# Patient Record
Sex: Male | Born: 1955 | Race: White | Hispanic: No | Marital: Married | Smoking: Current some day smoker
Health system: Southern US, Community
[De-identification: ages and names within clinical notes are randomized; demographics above are authoritative.]

## PROBLEM LIST (undated history)

## (undated) DIAGNOSIS — M779 Enthesopathy, unspecified: Secondary | ICD-10-CM

## (undated) DIAGNOSIS — J189 Pneumonia, unspecified organism: Secondary | ICD-10-CM

## (undated) DIAGNOSIS — K219 Gastro-esophageal reflux disease without esophagitis: Secondary | ICD-10-CM

## (undated) DIAGNOSIS — R0683 Snoring: Secondary | ICD-10-CM

## (undated) DIAGNOSIS — I509 Heart failure, unspecified: Secondary | ICD-10-CM

## (undated) DIAGNOSIS — C801 Malignant (primary) neoplasm, unspecified: Secondary | ICD-10-CM

## (undated) DIAGNOSIS — R0600 Dyspnea, unspecified: Secondary | ICD-10-CM

## (undated) DIAGNOSIS — J449 Chronic obstructive pulmonary disease, unspecified: Secondary | ICD-10-CM

## (undated) DIAGNOSIS — I2510 Atherosclerotic heart disease of native coronary artery without angina pectoris: Secondary | ICD-10-CM

## (undated) DIAGNOSIS — Z720 Tobacco use: Secondary | ICD-10-CM

## (undated) DIAGNOSIS — M1990 Unspecified osteoarthritis, unspecified site: Secondary | ICD-10-CM

## (undated) DIAGNOSIS — I429 Cardiomyopathy, unspecified: Secondary | ICD-10-CM

## (undated) DIAGNOSIS — K760 Fatty (change of) liver, not elsewhere classified: Secondary | ICD-10-CM

## (undated) DIAGNOSIS — C3490 Malignant neoplasm of unspecified part of unspecified bronchus or lung: Secondary | ICD-10-CM

## (undated) DIAGNOSIS — E119 Type 2 diabetes mellitus without complications: Secondary | ICD-10-CM

## (undated) DIAGNOSIS — G709 Myoneural disorder, unspecified: Secondary | ICD-10-CM

## (undated) DIAGNOSIS — I214 Non-ST elevation (NSTEMI) myocardial infarction: Secondary | ICD-10-CM

## (undated) HISTORY — DX: Morbid (severe) obesity due to excess calories: E66.01

## (undated) HISTORY — PX: SHOULDER ARTHROSCOPY: SHX128

## (undated) HISTORY — PX: CHOLECYSTECTOMY: SHX55

## (undated) HISTORY — PX: TONSILLECTOMY: SUR1361

## (undated) HISTORY — PX: WISDOM TOOTH EXTRACTION: SHX21

## (undated) HISTORY — PX: COLONOSCOPY: SHX174

## (undated) HISTORY — DX: Non-ST elevation (NSTEMI) myocardial infarction: I21.4

## (undated) HISTORY — DX: Malignant neoplasm of unspecified part of unspecified bronchus or lung: C34.90

## (undated) HISTORY — DX: Cardiomyopathy, unspecified: I42.9

## (undated) HISTORY — DX: Type 2 diabetes mellitus without complications: E11.9

## (undated) HISTORY — PX: UPPER GI ENDOSCOPY: SHX6162

---

## 2015-05-11 DIAGNOSIS — E1165 Type 2 diabetes mellitus with hyperglycemia: Secondary | ICD-10-CM | POA: Diagnosis not present

## 2015-05-11 DIAGNOSIS — K5900 Constipation, unspecified: Secondary | ICD-10-CM | POA: Diagnosis not present

## 2015-06-16 DIAGNOSIS — F17210 Nicotine dependence, cigarettes, uncomplicated: Secondary | ICD-10-CM | POA: Diagnosis not present

## 2015-06-16 DIAGNOSIS — R1030 Lower abdominal pain, unspecified: Secondary | ICD-10-CM | POA: Diagnosis not present

## 2015-06-16 DIAGNOSIS — Z1389 Encounter for screening for other disorder: Secondary | ICD-10-CM | POA: Diagnosis not present

## 2015-06-21 DIAGNOSIS — J020 Streptococcal pharyngitis: Secondary | ICD-10-CM | POA: Diagnosis not present

## 2015-06-21 DIAGNOSIS — R110 Nausea: Secondary | ICD-10-CM | POA: Diagnosis not present

## 2015-07-07 DIAGNOSIS — R21 Rash and other nonspecific skin eruption: Secondary | ICD-10-CM | POA: Diagnosis not present

## 2015-07-22 DIAGNOSIS — R21 Rash and other nonspecific skin eruption: Secondary | ICD-10-CM | POA: Diagnosis not present

## 2015-08-21 DIAGNOSIS — J440 Chronic obstructive pulmonary disease with acute lower respiratory infection: Secondary | ICD-10-CM | POA: Diagnosis not present

## 2015-08-21 DIAGNOSIS — I10 Essential (primary) hypertension: Secondary | ICD-10-CM | POA: Diagnosis not present

## 2015-08-21 DIAGNOSIS — E1165 Type 2 diabetes mellitus with hyperglycemia: Secondary | ICD-10-CM | POA: Diagnosis not present

## 2015-08-21 DIAGNOSIS — B358 Other dermatophytoses: Secondary | ICD-10-CM | POA: Diagnosis not present

## 2015-08-21 DIAGNOSIS — K210 Gastro-esophageal reflux disease with esophagitis: Secondary | ICD-10-CM | POA: Diagnosis not present

## 2015-11-26 DIAGNOSIS — I10 Essential (primary) hypertension: Secondary | ICD-10-CM | POA: Diagnosis not present

## 2015-11-26 DIAGNOSIS — J440 Chronic obstructive pulmonary disease with acute lower respiratory infection: Secondary | ICD-10-CM | POA: Diagnosis not present

## 2015-11-26 DIAGNOSIS — Z125 Encounter for screening for malignant neoplasm of prostate: Secondary | ICD-10-CM | POA: Diagnosis not present

## 2015-11-26 DIAGNOSIS — B358 Other dermatophytoses: Secondary | ICD-10-CM | POA: Diagnosis not present

## 2015-11-26 DIAGNOSIS — K210 Gastro-esophageal reflux disease with esophagitis: Secondary | ICD-10-CM | POA: Diagnosis not present

## 2015-11-26 DIAGNOSIS — E1165 Type 2 diabetes mellitus with hyperglycemia: Secondary | ICD-10-CM | POA: Diagnosis not present

## 2015-12-14 DIAGNOSIS — M25512 Pain in left shoulder: Secondary | ICD-10-CM | POA: Diagnosis not present

## 2016-03-04 DIAGNOSIS — F17200 Nicotine dependence, unspecified, uncomplicated: Secondary | ICD-10-CM | POA: Diagnosis not present

## 2016-03-04 DIAGNOSIS — K219 Gastro-esophageal reflux disease without esophagitis: Secondary | ICD-10-CM | POA: Diagnosis not present

## 2016-03-04 DIAGNOSIS — M19012 Primary osteoarthritis, left shoulder: Secondary | ICD-10-CM | POA: Diagnosis not present

## 2016-03-04 DIAGNOSIS — E1165 Type 2 diabetes mellitus with hyperglycemia: Secondary | ICD-10-CM | POA: Diagnosis not present

## 2016-03-04 DIAGNOSIS — I10 Essential (primary) hypertension: Secondary | ICD-10-CM | POA: Diagnosis not present

## 2016-03-04 DIAGNOSIS — M19011 Primary osteoarthritis, right shoulder: Secondary | ICD-10-CM | POA: Diagnosis not present

## 2016-03-04 DIAGNOSIS — M5030 Other cervical disc degeneration, unspecified cervical region: Secondary | ICD-10-CM | POA: Diagnosis not present

## 2016-03-04 DIAGNOSIS — A6920 Lyme disease, unspecified: Secondary | ICD-10-CM | POA: Diagnosis not present

## 2016-04-07 DIAGNOSIS — J40 Bronchitis, not specified as acute or chronic: Secondary | ICD-10-CM | POA: Diagnosis not present

## 2016-05-09 DIAGNOSIS — B349 Viral infection, unspecified: Secondary | ICD-10-CM | POA: Diagnosis not present

## 2016-05-13 DIAGNOSIS — R05 Cough: Secondary | ICD-10-CM | POA: Diagnosis not present

## 2016-05-13 DIAGNOSIS — R0602 Shortness of breath: Secondary | ICD-10-CM | POA: Diagnosis not present

## 2016-05-13 DIAGNOSIS — Z87891 Personal history of nicotine dependence: Secondary | ICD-10-CM | POA: Diagnosis not present

## 2016-06-13 DIAGNOSIS — J181 Lobar pneumonia, unspecified organism: Secondary | ICD-10-CM | POA: Diagnosis not present

## 2016-06-13 DIAGNOSIS — J189 Pneumonia, unspecified organism: Secondary | ICD-10-CM | POA: Diagnosis not present

## 2016-07-04 DIAGNOSIS — Z6827 Body mass index (BMI) 27.0-27.9, adult: Secondary | ICD-10-CM | POA: Diagnosis not present

## 2016-07-04 DIAGNOSIS — E7801 Familial hypercholesterolemia: Secondary | ICD-10-CM | POA: Diagnosis not present

## 2016-07-04 DIAGNOSIS — E1165 Type 2 diabetes mellitus with hyperglycemia: Secondary | ICD-10-CM | POA: Diagnosis not present

## 2016-07-04 DIAGNOSIS — Z0000 Encounter for general adult medical examination without abnormal findings: Secondary | ICD-10-CM | POA: Diagnosis not present

## 2016-07-04 DIAGNOSIS — Z1211 Encounter for screening for malignant neoplasm of colon: Secondary | ICD-10-CM | POA: Diagnosis not present

## 2016-07-04 DIAGNOSIS — M546 Pain in thoracic spine: Secondary | ICD-10-CM | POA: Diagnosis not present

## 2016-07-04 DIAGNOSIS — I10 Essential (primary) hypertension: Secondary | ICD-10-CM | POA: Diagnosis not present

## 2016-07-22 DIAGNOSIS — M549 Dorsalgia, unspecified: Secondary | ICD-10-CM | POA: Diagnosis not present

## 2016-10-10 DIAGNOSIS — Z6841 Body Mass Index (BMI) 40.0 and over, adult: Secondary | ICD-10-CM | POA: Diagnosis not present

## 2016-10-10 DIAGNOSIS — M549 Dorsalgia, unspecified: Secondary | ICD-10-CM | POA: Diagnosis not present

## 2016-10-10 DIAGNOSIS — F17210 Nicotine dependence, cigarettes, uncomplicated: Secondary | ICD-10-CM | POA: Diagnosis not present

## 2016-10-10 DIAGNOSIS — Z299 Encounter for prophylactic measures, unspecified: Secondary | ICD-10-CM | POA: Diagnosis not present

## 2016-10-10 DIAGNOSIS — E1165 Type 2 diabetes mellitus with hyperglycemia: Secondary | ICD-10-CM | POA: Diagnosis not present

## 2016-11-07 DIAGNOSIS — Z299 Encounter for prophylactic measures, unspecified: Secondary | ICD-10-CM | POA: Diagnosis not present

## 2016-11-07 DIAGNOSIS — Z6841 Body Mass Index (BMI) 40.0 and over, adult: Secondary | ICD-10-CM | POA: Diagnosis not present

## 2016-11-07 DIAGNOSIS — E1165 Type 2 diabetes mellitus with hyperglycemia: Secondary | ICD-10-CM | POA: Diagnosis not present

## 2016-11-07 DIAGNOSIS — F17210 Nicotine dependence, cigarettes, uncomplicated: Secondary | ICD-10-CM | POA: Diagnosis not present

## 2016-11-07 DIAGNOSIS — Z713 Dietary counseling and surveillance: Secondary | ICD-10-CM | POA: Diagnosis not present

## 2016-12-04 DIAGNOSIS — Z7982 Long term (current) use of aspirin: Secondary | ICD-10-CM | POA: Diagnosis not present

## 2016-12-04 DIAGNOSIS — M7062 Trochanteric bursitis, left hip: Secondary | ICD-10-CM | POA: Diagnosis not present

## 2016-12-04 DIAGNOSIS — F17200 Nicotine dependence, unspecified, uncomplicated: Secondary | ICD-10-CM | POA: Diagnosis not present

## 2016-12-04 DIAGNOSIS — Z7984 Long term (current) use of oral hypoglycemic drugs: Secondary | ICD-10-CM | POA: Diagnosis not present

## 2016-12-04 DIAGNOSIS — Z79899 Other long term (current) drug therapy: Secondary | ICD-10-CM | POA: Diagnosis not present

## 2016-12-04 DIAGNOSIS — E119 Type 2 diabetes mellitus without complications: Secondary | ICD-10-CM | POA: Diagnosis not present

## 2016-12-04 DIAGNOSIS — M7072 Other bursitis of hip, left hip: Secondary | ICD-10-CM | POA: Diagnosis not present

## 2016-12-04 DIAGNOSIS — M1612 Unilateral primary osteoarthritis, left hip: Secondary | ICD-10-CM | POA: Diagnosis not present

## 2017-01-17 DIAGNOSIS — F17210 Nicotine dependence, cigarettes, uncomplicated: Secondary | ICD-10-CM | POA: Diagnosis not present

## 2017-01-17 DIAGNOSIS — E1165 Type 2 diabetes mellitus with hyperglycemia: Secondary | ICD-10-CM | POA: Diagnosis not present

## 2017-01-17 DIAGNOSIS — Z299 Encounter for prophylactic measures, unspecified: Secondary | ICD-10-CM | POA: Diagnosis not present

## 2017-01-17 DIAGNOSIS — J069 Acute upper respiratory infection, unspecified: Secondary | ICD-10-CM | POA: Diagnosis not present

## 2017-01-17 DIAGNOSIS — Z2821 Immunization not carried out because of patient refusal: Secondary | ICD-10-CM | POA: Diagnosis not present

## 2017-01-17 DIAGNOSIS — Z6841 Body Mass Index (BMI) 40.0 and over, adult: Secondary | ICD-10-CM | POA: Diagnosis not present

## 2017-02-14 DIAGNOSIS — F17210 Nicotine dependence, cigarettes, uncomplicated: Secondary | ICD-10-CM | POA: Diagnosis not present

## 2017-02-14 DIAGNOSIS — J069 Acute upper respiratory infection, unspecified: Secondary | ICD-10-CM | POA: Diagnosis not present

## 2017-02-14 DIAGNOSIS — E1165 Type 2 diabetes mellitus with hyperglycemia: Secondary | ICD-10-CM | POA: Diagnosis not present

## 2017-02-14 DIAGNOSIS — Z299 Encounter for prophylactic measures, unspecified: Secondary | ICD-10-CM | POA: Diagnosis not present

## 2017-02-14 DIAGNOSIS — Z6841 Body Mass Index (BMI) 40.0 and over, adult: Secondary | ICD-10-CM | POA: Diagnosis not present

## 2017-02-14 DIAGNOSIS — H6690 Otitis media, unspecified, unspecified ear: Secondary | ICD-10-CM | POA: Diagnosis not present

## 2017-03-10 ENCOUNTER — Other Ambulatory Visit (HOSPITAL_COMMUNITY): Payer: Self-pay | Admitting: Nurse Practitioner

## 2017-03-10 ENCOUNTER — Ambulatory Visit (HOSPITAL_COMMUNITY)
Admission: RE | Admit: 2017-03-10 | Discharge: 2017-03-10 | Disposition: A | Discharge status: Home / Self Care | Payer: Medicare Other | Source: Ambulatory Visit | Attending: Nurse Practitioner | Admitting: Nurse Practitioner

## 2017-03-10 DIAGNOSIS — M79605 Pain in left leg: Secondary | ICD-10-CM | POA: Diagnosis not present

## 2017-03-10 DIAGNOSIS — F17210 Nicotine dependence, cigarettes, uncomplicated: Secondary | ICD-10-CM | POA: Diagnosis not present

## 2017-03-10 DIAGNOSIS — Z299 Encounter for prophylactic measures, unspecified: Secondary | ICD-10-CM | POA: Diagnosis not present

## 2017-03-10 DIAGNOSIS — Z6841 Body Mass Index (BMI) 40.0 and over, adult: Secondary | ICD-10-CM | POA: Diagnosis not present

## 2017-03-10 DIAGNOSIS — M79662 Pain in left lower leg: Secondary | ICD-10-CM | POA: Diagnosis not present

## 2017-03-10 DIAGNOSIS — M7989 Other specified soft tissue disorders: Secondary | ICD-10-CM | POA: Diagnosis not present

## 2017-03-10 DIAGNOSIS — E1165 Type 2 diabetes mellitus with hyperglycemia: Secondary | ICD-10-CM | POA: Diagnosis not present

## 2017-03-13 ENCOUNTER — Ambulatory Visit (HOSPITAL_COMMUNITY): Payer: Self-pay

## 2017-11-06 DIAGNOSIS — E1165 Type 2 diabetes mellitus with hyperglycemia: Secondary | ICD-10-CM | POA: Diagnosis not present

## 2017-11-06 DIAGNOSIS — Z6841 Body Mass Index (BMI) 40.0 and over, adult: Secondary | ICD-10-CM | POA: Diagnosis not present

## 2017-11-06 DIAGNOSIS — L739 Follicular disorder, unspecified: Secondary | ICD-10-CM | POA: Diagnosis not present

## 2017-11-06 DIAGNOSIS — K219 Gastro-esophageal reflux disease without esophagitis: Secondary | ICD-10-CM | POA: Diagnosis not present

## 2017-11-06 DIAGNOSIS — Z299 Encounter for prophylactic measures, unspecified: Secondary | ICD-10-CM | POA: Diagnosis not present

## 2017-11-23 DIAGNOSIS — Z6841 Body Mass Index (BMI) 40.0 and over, adult: Secondary | ICD-10-CM | POA: Diagnosis not present

## 2017-11-23 DIAGNOSIS — R079 Chest pain, unspecified: Secondary | ICD-10-CM | POA: Diagnosis not present

## 2017-11-23 DIAGNOSIS — M1710 Unilateral primary osteoarthritis, unspecified knee: Secondary | ICD-10-CM | POA: Diagnosis not present

## 2017-11-23 DIAGNOSIS — R5383 Other fatigue: Secondary | ICD-10-CM | POA: Diagnosis not present

## 2017-11-23 DIAGNOSIS — I7389 Other specified peripheral vascular diseases: Secondary | ICD-10-CM | POA: Diagnosis not present

## 2017-11-23 DIAGNOSIS — Z299 Encounter for prophylactic measures, unspecified: Secondary | ICD-10-CM | POA: Diagnosis not present

## 2018-02-13 DIAGNOSIS — Z0000 Encounter for general adult medical examination without abnormal findings: Secondary | ICD-10-CM | POA: Diagnosis not present

## 2018-02-13 DIAGNOSIS — Z7189 Other specified counseling: Secondary | ICD-10-CM | POA: Diagnosis not present

## 2018-02-13 DIAGNOSIS — R5383 Other fatigue: Secondary | ICD-10-CM | POA: Diagnosis not present

## 2018-02-13 DIAGNOSIS — Z1211 Encounter for screening for malignant neoplasm of colon: Secondary | ICD-10-CM | POA: Diagnosis not present

## 2018-02-13 DIAGNOSIS — E1165 Type 2 diabetes mellitus with hyperglycemia: Secondary | ICD-10-CM | POA: Diagnosis not present

## 2018-02-13 DIAGNOSIS — E7800 Pure hypercholesterolemia, unspecified: Secondary | ICD-10-CM | POA: Diagnosis not present

## 2018-02-13 DIAGNOSIS — I7389 Other specified peripheral vascular diseases: Secondary | ICD-10-CM | POA: Diagnosis not present

## 2018-02-13 DIAGNOSIS — Z1331 Encounter for screening for depression: Secondary | ICD-10-CM | POA: Diagnosis not present

## 2018-02-13 DIAGNOSIS — Z1339 Encounter for screening examination for other mental health and behavioral disorders: Secondary | ICD-10-CM | POA: Diagnosis not present

## 2018-02-14 DIAGNOSIS — Z79899 Other long term (current) drug therapy: Secondary | ICD-10-CM | POA: Diagnosis not present

## 2018-02-14 DIAGNOSIS — R5383 Other fatigue: Secondary | ICD-10-CM | POA: Diagnosis not present

## 2018-02-14 DIAGNOSIS — Z125 Encounter for screening for malignant neoplasm of prostate: Secondary | ICD-10-CM | POA: Diagnosis not present

## 2018-02-14 DIAGNOSIS — E7800 Pure hypercholesterolemia, unspecified: Secondary | ICD-10-CM | POA: Diagnosis not present

## 2018-02-20 DIAGNOSIS — R0689 Other abnormalities of breathing: Secondary | ICD-10-CM | POA: Diagnosis not present

## 2018-02-20 DIAGNOSIS — G4769 Other sleep related movement disorders: Secondary | ICD-10-CM | POA: Diagnosis not present

## 2018-02-20 DIAGNOSIS — G4733 Obstructive sleep apnea (adult) (pediatric): Secondary | ICD-10-CM | POA: Diagnosis not present

## 2018-02-20 DIAGNOSIS — R0902 Hypoxemia: Secondary | ICD-10-CM | POA: Diagnosis not present

## 2018-02-20 DIAGNOSIS — I7389 Other specified peripheral vascular diseases: Secondary | ICD-10-CM | POA: Diagnosis not present

## 2018-03-02 DIAGNOSIS — E1165 Type 2 diabetes mellitus with hyperglycemia: Secondary | ICD-10-CM | POA: Diagnosis not present

## 2018-03-02 DIAGNOSIS — Z6841 Body Mass Index (BMI) 40.0 and over, adult: Secondary | ICD-10-CM | POA: Diagnosis not present

## 2018-03-02 DIAGNOSIS — J020 Streptococcal pharyngitis: Secondary | ICD-10-CM | POA: Diagnosis not present

## 2018-03-02 DIAGNOSIS — Z299 Encounter for prophylactic measures, unspecified: Secondary | ICD-10-CM | POA: Diagnosis not present

## 2018-03-29 ENCOUNTER — Other Ambulatory Visit: Payer: Self-pay

## 2018-03-29 ENCOUNTER — Encounter (HOSPITAL_COMMUNITY): Payer: Self-pay | Admitting: Emergency Medicine

## 2018-03-29 ENCOUNTER — Inpatient Hospital Stay (HOSPITAL_COMMUNITY)
Admission: EM | Admit: 2018-03-29 | Discharge: 2018-03-31 | DRG: 280 | Disposition: A | Discharge status: Home / Self Care | Payer: Medicare HMO | Attending: Cardiovascular Disease | Admitting: Cardiovascular Disease

## 2018-03-29 DIAGNOSIS — Z881 Allergy status to other antibiotic agents status: Secondary | ICD-10-CM | POA: Diagnosis not present

## 2018-03-29 DIAGNOSIS — I255 Ischemic cardiomyopathy: Secondary | ICD-10-CM | POA: Diagnosis present

## 2018-03-29 DIAGNOSIS — E785 Hyperlipidemia, unspecified: Secondary | ICD-10-CM | POA: Diagnosis present

## 2018-03-29 DIAGNOSIS — I25110 Atherosclerotic heart disease of native coronary artery with unstable angina pectoris: Secondary | ICD-10-CM | POA: Diagnosis present

## 2018-03-29 DIAGNOSIS — Z6841 Body Mass Index (BMI) 40.0 and over, adult: Secondary | ICD-10-CM | POA: Diagnosis not present

## 2018-03-29 DIAGNOSIS — E119 Type 2 diabetes mellitus without complications: Secondary | ICD-10-CM

## 2018-03-29 DIAGNOSIS — Z79899 Other long term (current) drug therapy: Secondary | ICD-10-CM | POA: Diagnosis not present

## 2018-03-29 DIAGNOSIS — Z7984 Long term (current) use of oral hypoglycemic drugs: Secondary | ICD-10-CM | POA: Diagnosis not present

## 2018-03-29 DIAGNOSIS — F17200 Nicotine dependence, unspecified, uncomplicated: Secondary | ICD-10-CM | POA: Diagnosis not present

## 2018-03-29 DIAGNOSIS — I5043 Acute on chronic combined systolic (congestive) and diastolic (congestive) heart failure: Secondary | ICD-10-CM | POA: Diagnosis present

## 2018-03-29 DIAGNOSIS — Z720 Tobacco use: Secondary | ICD-10-CM | POA: Diagnosis not present

## 2018-03-29 DIAGNOSIS — I214 Non-ST elevation (NSTEMI) myocardial infarction: Principal | ICD-10-CM | POA: Diagnosis present

## 2018-03-29 DIAGNOSIS — F17210 Nicotine dependence, cigarettes, uncomplicated: Secondary | ICD-10-CM | POA: Diagnosis not present

## 2018-03-29 DIAGNOSIS — I110 Hypertensive heart disease with heart failure: Secondary | ICD-10-CM | POA: Diagnosis present

## 2018-03-29 DIAGNOSIS — Z885 Allergy status to narcotic agent status: Secondary | ICD-10-CM

## 2018-03-29 DIAGNOSIS — Z7982 Long term (current) use of aspirin: Secondary | ICD-10-CM | POA: Diagnosis not present

## 2018-03-29 DIAGNOSIS — R079 Chest pain, unspecified: Secondary | ICD-10-CM | POA: Diagnosis not present

## 2018-03-29 DIAGNOSIS — Z5329 Procedure and treatment not carried out because of patient's decision for other reasons: Secondary | ICD-10-CM | POA: Diagnosis not present

## 2018-03-29 DIAGNOSIS — I2510 Atherosclerotic heart disease of native coronary artery without angina pectoris: Secondary | ICD-10-CM | POA: Diagnosis not present

## 2018-03-29 HISTORY — DX: Snoring: R06.83

## 2018-03-29 HISTORY — DX: Tobacco use: Z72.0

## 2018-03-29 LAB — I-STAT TROPONIN, ED: Troponin i, poc: 0.66 ng/mL (ref 0.00–0.08)

## 2018-03-29 LAB — BASIC METABOLIC PANEL
ANION GAP: 13 (ref 5–15)
BUN: 16 mg/dL (ref 8–23)
CO2: 30 mmol/L (ref 22–32)
Calcium: 9.3 mg/dL (ref 8.9–10.3)
Chloride: 101 mmol/L (ref 98–111)
Creatinine, Ser: 0.8 mg/dL (ref 0.61–1.24)
GFR calc Af Amer: >60 mL/min (ref >60)
GFR calc non Af Amer: >60 mL/min (ref >60)
GLUCOSE: 116 mg/dL — AB (ref 70–99)
POTASSIUM: 4.5 mmol/L (ref 3.5–5.1)
Sodium: 144 mmol/L (ref 135–145)

## 2018-03-29 LAB — HEPARIN LEVEL (UNFRACTIONATED): Heparin Unfractionated: <0.1 IU/mL — ABNORMAL LOW (ref 0.30–0.70)

## 2018-03-29 LAB — CBC
HCT: 62.2 % — ABNORMAL HIGH (ref 39.0–52.0)
Hemoglobin: 19.4 g/dL — ABNORMAL HIGH (ref 13.0–17.0)
MCH: 28.2 pg (ref 26.0–34.0)
MCHC: 31.2 g/dL (ref 30.0–36.0)
MCV: 90.3 fL (ref 80.0–100.0)
NRBC: 0 % (ref 0.0–0.2)
Platelets: 188 10*3/uL (ref 150–400)
RBC: 6.89 MIL/uL — ABNORMAL HIGH (ref 4.22–5.81)
RDW: 15.3 % (ref 11.5–15.5)
WBC: 8.2 10*3/uL (ref 4.0–10.5)

## 2018-03-29 LAB — TROPONIN I
Troponin I: 0.81 ng/mL (ref <0.03)
Troponin I: 0.43 ng/mL (ref <0.03)

## 2018-03-29 LAB — GLUCOSE, CAPILLARY
Glucose-Capillary: 133 mg/dL — ABNORMAL HIGH (ref 70–99)
Glucose-Capillary: 187 mg/dL — ABNORMAL HIGH (ref 70–99)

## 2018-03-29 LAB — TSH: TSH: 1.08 u[IU]/mL (ref 0.350–4.500)

## 2018-03-29 MED ORDER — HEPARIN BOLUS VIA INFUSION
4000.0000 [IU] | Freq: Once | INTRAVENOUS | Status: AC
  Administered 2018-03-29: 4000 [IU] via INTRAVENOUS
  Filled 2018-03-29: qty 4000

## 2018-03-29 MED ORDER — ACETAMINOPHEN 325 MG PO TABS: 650.0000 mg | ORAL_TABLET | ORAL | Status: DC | PRN

## 2018-03-29 MED ORDER — PANTOPRAZOLE SODIUM 40 MG PO TBEC
40.0000 mg | DELAYED_RELEASE_TABLET | Freq: Every day | ORAL | Status: DC
  Administered 2018-03-30 – 2018-03-31 (×2): 40 mg via ORAL
  Filled 2018-03-29 (×3): qty 1

## 2018-03-29 MED ORDER — ASPIRIN 81 MG PO CHEW
81.0000 mg | CHEWABLE_TABLET | ORAL | Status: AC
  Administered 2018-03-30: 81 mg via ORAL
  Filled 2018-03-29: qty 1

## 2018-03-29 MED ORDER — HEPARIN (PORCINE) 25000 UT/250ML-% IV SOLN
2050.0000 [IU]/h | INTRAVENOUS | Status: DC
  Administered 2018-03-29: 1500 [IU]/h via INTRAVENOUS
  Administered 2018-03-29: 1900 [IU]/h via INTRAVENOUS
  Filled 2018-03-29 (×2): qty 250

## 2018-03-29 MED ORDER — SODIUM CHLORIDE 0.9% FLUSH: 3.0000 mL | Freq: Two times a day (BID) | INTRAVENOUS | Status: DC

## 2018-03-29 MED ORDER — NITROGLYCERIN 0.4 MG SL SUBL: 0.4000 mg | SUBLINGUAL_TABLET | SUBLINGUAL | Status: DC | PRN

## 2018-03-29 MED ORDER — ASPIRIN EC 81 MG PO TBEC: 81.0000 mg | DELAYED_RELEASE_TABLET | Freq: Every day | ORAL | Status: DC

## 2018-03-29 MED ORDER — ATORVASTATIN CALCIUM 40 MG PO TABS
40.0000 mg | ORAL_TABLET | Freq: Every day | ORAL | Status: DC
  Filled 2018-03-29: qty 1

## 2018-03-29 MED ORDER — METOPROLOL TARTRATE 25 MG PO TABS
25.0000 mg | ORAL_TABLET | Freq: Two times a day (BID) | ORAL | Status: DC
  Administered 2018-03-30 – 2018-03-31 (×2): 25 mg via ORAL
  Filled 2018-03-29 (×3): qty 1

## 2018-03-29 MED ORDER — SODIUM CHLORIDE 0.9 % WEIGHT BASED INFUSION
3.0000 mL/kg/h | INTRAVENOUS | Status: AC
  Administered 2018-03-30: 3 mL/kg/h via INTRAVENOUS

## 2018-03-29 MED ORDER — NITROGLYCERIN 2 % TD OINT
1.0000 [in_us] | TOPICAL_OINTMENT | Freq: Four times a day (QID) | TRANSDERMAL | Status: DC
  Filled 2018-03-29: qty 30

## 2018-03-29 MED ORDER — ONDANSETRON HCL 4 MG/2ML IJ SOLN: 4.0000 mg | Freq: Four times a day (QID) | INTRAMUSCULAR | Status: DC | PRN

## 2018-03-29 MED ORDER — SODIUM CHLORIDE 0.9% FLUSH: 3.0000 mL | INTRAVENOUS | Status: DC | PRN

## 2018-03-29 MED ORDER — SODIUM CHLORIDE 0.9 % WEIGHT BASED INFUSION
1.0000 mL/kg/h | INTRAVENOUS | Status: DC
  Administered 2018-03-30: 1 mL/kg/h via INTRAVENOUS

## 2018-03-29 MED ORDER — SODIUM CHLORIDE 0.9 % IV SOLN: 250.0000 mL | INTRAVENOUS | Status: DC | PRN

## 2018-03-29 NOTE — Progress Notes (Signed)
ANTICOAGULATION CONSULT NOTE - Initial Consult  Pharmacy Consult for Heparin Indication: chest pain/ACS  Allergies  Allergen Reactions  . Codeine Nausea And Vomiting  . Doxycycline     Patient Measurements: Height: 5\' 9"  (175.3 cm) Weight: 289 lb (131.1 kg) IBW/kg (Calculated) : 70.7 Heparin Dosing Weight: 101 kg  Vital Signs: Temp: 98.4 F (36.9 C) (12/12 1305) Temp Source: Oral (12/12 1305) BP: 167/85 (12/12 1315) Pulse Rate: 100 (12/12 1315)  Labs: Recent Labs    03/29/18 1304  HGB 19.4*  HCT 62.2*  PLT 188    CrCl cannot be calculated (No successful lab value found.).   Medical History: Past Medical History:  Diagnosis Date  . Diabetes mellitus without complication (HCC)     Medications:  Await home med rec  Assessment: 62 y.o. M presented to [facility] ED - found to have elevated troponin and drove himself over to [facility] ED for possible cath - he refused transportation via ambulance. He was given ASA 324mg  and Nitroglycerin at [facility]. Pt reports that he wasn't given any Lovenox or Heparin. Pharmacy consulted to start heparin for NSTEMI. CBC wnl on admission  Goal of Therapy:  Heparin level 0.3-0.7 units/ml Monitor platelets by anticoagulation protocol: Yes   Plan:  Heparin IV bolus 4000 units Heparin gtt at 1500 units/hr Will f/u heparin level in 6 hours Daily heparin level and CBC  Sherlon Handing, PharmD, BCPS Clinical pharmacist  **Pharmacist phone directory can now be found on [email or web address] ([email or web address]).  Listed under [place]. 03/29/2018,1:42 PM

## 2018-03-29 NOTE — H&P (Addendum)
Cardiology Admission History and Physical:   Patient ID: Thomas [name] MRN: [id number]; DOB: Feb 26, 1956   Admission date: 03/29/2018  Primary Care Provider: No primary care provider on file. Primary Cardiologist: New to [facility] Chief Complaint:  Chest pain   Patient Profile:   Thomas [name] is a 62 y.o. male with history of diabetes mellitus, 50-pack-year tobacco smoking, snoring and morbid obesity presented for chest pain.  Remote stress test in 2011 at [place] was low risk.Marland Kitchen  He smokes 1 pack a day since age 62.  History of Present Illness:   Thomas [name] has been having exertional left-sided chest pressure for past 10 days.  No associated shortness of breath, diaphoresis, nausea or radiation.  Relieved with rest in 10 to 15 minutes.  Gradual worsening.  He went to [facility] where noted elevated troponin and requested transfer to [facility] however he declined and wife brought him over here.  Currently chest pain-free.  Denies resting pain.  Recently had sleep study which was inconclusive per patient.  Here he has elevated blood pressure, point-of-care troponin 0.66.  Hemoglobin 19.4.  Electrolyte and serum creatinine normal.  EKG shows sinus rhythm at rate of 100 bpm and anterior Q waves-personally reviewed.  He denies orthopnea, PND, syncope, dizziness, lower extremity edema or melena.  Past Medical History:  Diagnosis Date  . Diabetes mellitus without complication ([place])   . Snoring   . Tobacco abuse     History reviewed. No pertinent surgical history.   Medications Prior to Admission: Prior to Admission medications   Not on File  Takes diabetic medication  Allergies:    Allergies  Allergen Reactions  . Codeine Nausea And Vomiting  . Doxycycline     Social History:   Social History   Socioeconomic History  . Marital status: Not on file    Spouse name: Not on file  . Number of children: Not on file  . Years of education: Not on file  . Highest education  level: Not on file  Occupational History  . Not on file  Social Needs  . Financial resource strain: Not on file  . Food insecurity:    Worry: Not on file    Inability: Not on file  . Transportation needs:    Medical: Not on file    Non-medical: Not on file  Tobacco Use  . Smoking status: Not on file  Substance and Sexual Activity  . Alcohol use: Not on file  . Drug use: Not on file  . Sexual activity: Not on file  Lifestyle  . Physical activity:    Days per week: Not on file    Minutes per session: Not on file  . Stress: Not on file  Relationships  . Social connections:    Talks on phone: Not on file    Gets together: Not on file    Attends religious service: Not on file    Active member of club or organization: Not on file    Attends meetings of clubs or organizations: Not on file    Relationship status: Not on file  . Intimate partner violence:    Fear of current or ex partner: Not on file    Emotionally abused: Not on file    Physically abused: Not on file    Forced sexual activity: Not on file  Other Topics Concern  . Not on file  Social History Narrative  . Not on file    Family History:  The patient's  family history includes Emphysema in his father; Heart failure in his father.    ROS:  Please see the history of present illness.  All other ROS reviewed and negative.     Physical Exam/Data:   Vitals:   03/29/18 1345 03/29/18 1400 03/29/18 1415 03/29/18 1430  BP: (!) 155/84 (!) 162/82 (!) 146/80 (!) 149/80  Pulse: (!) 101 100 96 91  Resp: 16 (!) 21 (!) 22 (!) 21  Temp:      TempSrc:      SpO2: (!) 88% (!) 89% 90% (!) 89%  Weight:      Height:       No intake or output data in the 24 hours ending 03/29/18 1456 Filed Weights   03/29/18 1259  Weight: 131.1 kg   Body mass index is 42.68 kg/m.  General: Obese male in no acute distress HEENT: normal Lymph: no adenopathy Neck: no JVD Endocrine:  No thryomegaly Vascular: No carotid bruits; FA pulses  2+ bilaterally without bruits  Cardiac:  normal S1, S2; RRR; no murmur Lungs:  clear to auscultation bilaterally, no wheezing, rhonchi or rales  Abd: soft, nontender, no hepatomegaly  Ext: Trace edema Musculoskeletal:  No deformities, BUE and BLE strength normal and equal Skin: warm and dry  Neuro:  CNs 2-12 intact, no focal abnormalities noted Psych:  Normal affect   Relevant CV Studies: As summarized above  Laboratory Data:  Chemistry Recent Labs  Lab 03/29/18 1304  NA 144  K 4.5  CL 101  CO2 30  GLUCOSE 116*  BUN 16  CREATININE 0.80  CALCIUM 9.3  GFRNONAA >60  GFRAA >60  ANIONGAP 13    No results for input(s): PROT, ALBUMIN, AST, ALT, ALKPHOS, BILITOT in the last 168 hours. Hematology Recent Labs  Lab 03/29/18 1304  WBC 8.2  RBC 6.89*  HGB 19.4*  HCT 62.2*  MCV 90.3  MCH 28.2  MCHC 31.2  RDW 15.3  PLT 188   Cardiac EnzymesNo results for input(s): TROPONINI in the last 168 hours.  Recent Labs  Lab 03/29/18 1308  TROPIPOC 0.66*    Radiology/Studies:  No results found.  Assessment and Plan:   1. Non-STEMI -Point-of-care troponin 0.66.  EKG without acute abnormality.  Admit and cycle troponin.  Start aspirin, statin and beta-blocker.  Lipid panel and A1c. Get echo. -The patient understands that risks include but are not limited to stroke (1 in 1000), death (1 in 80), kidney failure [usually temporary] (1 in 500), bleeding (1 in 200), allergic reaction [possibly serious] (1 in 200), and agrees to proceed.   2.  Diabetes -Patient does not want insulin during admission.  Hold metformin for cath tomorrow. - Resume other diabetic medication per pharmacy.  3.  Tobacco abuse -Cessation advised.  Nicotine patch while here.  Patient likes to avoid frequent labs and vitals checks overnight as he gests headache for few days if not have good night sleep.  Severity of Illness: The appropriate patient status for this patient is INPATIENT. Inpatient status is  judged to be reasonable and necessary in order to provide the required intensity of service to ensure the patient's safety. The patient's presenting symptoms, physical exam findings, and initial radiographic and laboratory data in the context of their chronic comorbidities is felt to place them at high risk for further clinical deterioration. Furthermore, it is not anticipated that the patient will be medically stable for discharge from the hospital within 2 midnights of admission. The following factors support the patient status  of inpatient.   " The patient's presenting symptoms include chest pain. " The worrisome physical exam findings include obesity " The initial radiographic and laboratory data are worrisome because of elevated troponin " The chronic co-morbidities include Smoking and sedentary life   * I certify that at the point of admission it is my clinical judgment that the patient will require inpatient hospital care spanning beyond 2 midnights from the point of admission due to high intensity of service, high risk for further deterioration and high frequency of surveillance required.*    For questions or updates, please contact [place] Please consult [email or web address] for contact info under      Jarrett Soho, PA  03/29/2018 2:56 PM    I have personally seen and examined this patient with Thomas [name]. I agree with the assessment and plan as outlined above. Thomas [name] is a 62 yo male with history of morbid obesity, ongoing tobacco abuse and DM being admitted with chest pain. He has had exertional and resting chest pain for 10 days. The pain is relieved with rest. Troponin 0.66. EKG personally reviewed by me and shows sinus tachycardia, rate 100 bpm, poor R wave progression in the precordial leads-possible prior septal infarct. No pain at time of my exam.  My exam: General: Morbidly obese male in NAD.  HEENT: OP clear, mucus membranes moist  SKIN: warm, dry. No  rashes. Neuro: No focal deficits  Musculoskeletal: Muscle strength 5/5 all ext  Psychiatric: Mood and affect normal  Neck: No JVD, no carotid bruits, no thyromegaly, no lymphadenopathy.  Lungs:Clear bilaterally, no wheezes, rhonci, crackles Cardiovascular: Regular rate and rhythm. No murmurs, gallops or rubs. Abdomen:Soft. Bowel sounds present. Non-tender.  Extremities: No lower extremity edema. Pulses are 2 + in the bilateral DP/PT.  Plan: NSTEMI: Exertional chest pain c/w unstable angina. Elevated troponin. High probability of obstructive CAD. No active chest pain at rest. EKG without injury current. Will admit to telemetry unit. IV heparin tonight. Cycle troponin. Start ASA, statin and beta blocker. Cardiac cath tomorrow with possible PCI. NPO at midnight.   I have reviewed the risks, indications, and alternatives to cardiac catheterization, possible angioplasty, and stenting with the patient. Risks include but are not limited to bleeding, infection, vascular injury, stroke, myocardial infection, arrhythmia, kidney injury, radiation-related injury in the case of prolonged fluoroscopy use, emergency cardiac surgery, and death. The patient understands the risks of serious complication is 1-2 in 1740 with diagnostic cardiac cath and 1-2% or less with angioplasty/stenting.  Lauree Chandler 03/29/2018 3:39 PM

## 2018-03-29 NOTE — Progress Notes (Signed)
    Returned call to RN regarding page about elevated troponin. 0.66>>0.81. Pt is currently CP free and on IV heparin with plans for cath tomorrow.  Lyda Jester, PA-C 03/29/2018

## 2018-03-29 NOTE — ED Triage Notes (Signed)
Pt arrives POV to ED from [facility] ED with complaints of an elevated troponin of 0.06 and chest pain for the past two weeks. Pt reports that he came here to see a cardiologist and get cathed. Pt reports he took x3 nitro and is currently pain free. Pt placed in position of comfort with bed locked and lowered, call bell in reach.

## 2018-03-29 NOTE — Progress Notes (Signed)
Critical Result  - Troponin = 0.81. Dr Margaretann Loveless notified via [facility] text messaging.

## 2018-03-29 NOTE — ED Notes (Signed)
Cardiology at bedside.

## 2018-03-29 NOTE — ED Provider Notes (Signed)
[place] EMERGENCY DEPARTMENT Provider Note   CSN: [id number] Arrival date & time: 03/29/18  1248     History   Chief Complaint Chief Complaint  Patient presents with  . Abnormal Lab  . Chest Pain    HPI Thomas [name] is a 62 y.o. male.  HPI Patient reports for the past week he has been getting pain in his left upper chest with exertion.  If he walks for a little ways he starts to get a sharp and uncomfortable pain that radiates up towards the shoulder.  It improves with rest.  Sometimes it has associated slight nausea.  He has not had any syncopal episodes.  No significant shortness of breath.  He reports he chronically has some cough due to his allergies there is been no change.  Patient is also a longtime chronic smoker.  Patient was seen at the emergency department in [place] and advised that he had an NSTEMI.  They wish to treat him in transfer by ambulance but patient refused ambulance transport.  He has come by private vehicle.  Patient denies he has any chest pain at this time.  He denies any symptoms currently he reports that the symptoms come on when he starts to walk a short distance.  He denies he is ever had a cardiac catheterization before.  He reports he was given 3 aspirin and 3 nitroglycerin at [facility]. Past Medical History:  Diagnosis Date  . Diabetes mellitus without complication ([place])   . Snoring   . Tobacco abuse     There are no active problems to display for this patient.   History reviewed. No pertinent surgical history.      Home Medications    Prior to Admission medications   Medication Sig Start Date End Date Taking? Authorizing Provider  aspirin EC 81 MG tablet Take 81 mg by mouth daily.   Yes [provider]  canagliflozin (INVOKANA) 300 MG TABS tablet Take 300 mg by mouth daily before breakfast.   Yes [provider]  metFORMIN (GLUCOPHAGE) 500 MG tablet Take 500-1,000 mg by mouth See admin  instructions. Take 500 mg in the morning and 1000 mg in the evening   Yes [provider]  nitroGLYCERIN (NITROSTAT) 0.4 MG SL tablet Place 0.4 mg under the tongue every 5 (five) minutes as needed for chest pain.   Yes [provider]  omeprazole (PRILOSEC) 40 MG capsule Take 40 mg by mouth daily.   Yes [provider]    Family History Family History  Problem Relation Age of Onset  . Emphysema Father   . Heart failure Father     Social History Social History   Tobacco Use  . Smoking status: Not on file  Substance Use Topics  . Alcohol use: Not on file  . Drug use: Not on file     Allergies   Codeine and Doxycycline   Review of Systems Review of Systems 10 Systems reviewed and are negative for acute change except as noted in the HPI.   Physical Exam Updated Vital Signs BP (!) 153/84   Pulse 94   Temp 98.4 F (36.9 C) (Oral)   Resp 19   Ht 5\' 9"  (1.753 m)   Wt 131.1 kg   SpO2 (!) 89%   BMI 42.68 kg/m   Physical Exam Constitutional:      General: He is not in acute distress.    Appearance: He is obese. He is not ill-appearing or  diaphoretic.  HENT:     Head: Normocephalic and atraumatic.  Cardiovascular:     Rate and Rhythm: Normal rate and regular rhythm.     Pulses: Normal pulses.  Pulmonary:     Effort: Pulmonary effort is normal.     Breath sounds: Normal breath sounds.  Abdominal:     General: There is no distension.     Palpations: Abdomen is soft.     Tenderness: There is no abdominal tenderness. There is no guarding.  Musculoskeletal: Normal range of motion.     Comments: Trace edema bilateral lower extremities.  Left slightly greater than right.  (Patient reports this is chronic).  Skin:    General: Skin is warm and dry.  Neurological:     General: No focal deficit present.     Mental Status: He is alert and oriented to person, place, and time.     Cranial Nerves: No cranial nerve deficit.     Coordination:  Coordination normal.  Psychiatric:        Mood and Affect: Mood normal.      ED Treatments / Results  Labs (all labs ordered are listed, but only abnormal results are displayed) Labs Reviewed  BASIC METABOLIC PANEL - Abnormal; Notable for the following components:      Result Value   Glucose, Bld 116 (*)    All other components within normal limits  CBC - Abnormal; Notable for the following components:   RBC 6.89 (*)    Hemoglobin 19.4 (*)    HCT 62.2 (*)    All other components within normal limits  I-STAT TROPONIN, ED - Abnormal; Notable for the following components:   Troponin i, poc 0.66 (*)    All other components within normal limits  HEPARIN LEVEL (UNFRACTIONATED)    EKG EKG Interpretation  Date/Time:  Thursday March 29 2018 13:05:03 EST Ventricular Rate:  100 PR Interval:    QRS Duration: 100 QT Interval:  366 QTC Calculation: 473 R Axis:   -132 Text Interpretation:  Sinus tachycardia Anterior infarct, old poor r wave progression no STEMI Confirmed by Charlesetta Shanks [phone]) on 03/29/2018 3:19:25 PM   Radiology No results found.  Procedures Procedures (including critical care time) CRITICAL CARE Performed by: Charlesetta Shanks   Total critical care time: 30 minutes  Critical care time was exclusive of separately billable procedures and treating other patients.  Critical care was necessary to treat or prevent imminent or life-threatening deterioration.  Critical care was time spent personally by me on the following activities: development of treatment plan with patient and/or surrogate as well as nursing, discussions with consultants, evaluation of patient's response to treatment, examination of patient, obtaining history from patient or surrogate, ordering and performing treatments and interventions, ordering and review of laboratory studies, ordering and review of radiographic studies, pulse oximetry and re-evaluation of patient's condition. Medications  Ordered in ED Medications  nitroGLYCERIN (NITROGLYN) 2 % ointment 1 inch (1 inch Topical Not Given 03/29/18 1511)  heparin ADULT infusion 100 units/mL (25000 units/224mL sodium chloride 0.45%) (1,500 Units/hr Intravenous New Bag/Given 03/29/18 1407)  heparin bolus via infusion 4,000 Units (4,000 Units Intravenous Bolus from Bag 03/29/18 1408)     Initial Impression / Assessment and Plan / ED Course  I have reviewed the triage vital signs and the nursing notes.  Pertinent labs & imaging results that were available during my care of the patient were reviewed by me and considered in my medical decision making (see chart for details).  Clinical Course as of Mar 29 1518  Thu Mar 29, 2018  1422 Have ordered consults twice for cardiology.  At this point will try to direct page through [facility].   [MP]  [place] cardiology has returned page and will have consult sent for the patient.   [MP]    Clinical Course User Index [MP] Charlesetta Shanks, MD   Patient presents with history consistent with unstable angina.  He has been experiencing chest pain with exertion.  He has mildly elevated troponin.  Patient may have had a infarct earlier in the week.  He started having chest pain about a week ago that is now exertional.  Patient does not have active chest pain at this time.  He has been started on heparin.  He had aspirin prior to arrival.  Nitroglycerin paste applied.  Cardiology has been consulted.  Patient has multiple risk factors and is a longtime smoker.  Final Clinical Impressions(s) / ED Diagnoses   Final diagnoses:  NSTEMI (non-ST elevated myocardial infarction) [facility])    ED Discharge Orders    None       Charlesetta Shanks, MD 03/29/18 1521

## 2018-03-29 NOTE — Plan of Care (Signed)
  Problem: Education: Goal: Knowledge of General Education information will improve Description Including pain rating scale, medication(s)/side effects and non-pharmacologic comfort measures Outcome: Progressing   Problem: Clinical Measurements: Goal: Respiratory complications will improve Outcome: Completed/Met

## 2018-03-30 ENCOUNTER — Encounter (HOSPITAL_COMMUNITY)
Admission: EM | Disposition: A | Discharge status: Home / Self Care | Payer: Self-pay | Source: Home / Self Care | Attending: Cardiovascular Disease

## 2018-03-30 ENCOUNTER — Other Ambulatory Visit (HOSPITAL_COMMUNITY): Payer: Medicare HMO

## 2018-03-30 DIAGNOSIS — E785 Hyperlipidemia, unspecified: Secondary | ICD-10-CM

## 2018-03-30 DIAGNOSIS — I2510 Atherosclerotic heart disease of native coronary artery without angina pectoris: Secondary | ICD-10-CM

## 2018-03-30 HISTORY — PX: LEFT HEART CATH AND CORONARY ANGIOGRAPHY: CATH118249

## 2018-03-30 LAB — CBC
HEMATOCRIT: 59.3 % — AB (ref 39.0–52.0)
Hemoglobin: 18.1 g/dL — ABNORMAL HIGH (ref 13.0–17.0)
MCH: 27.5 pg (ref 26.0–34.0)
MCHC: 30.5 g/dL (ref 30.0–36.0)
MCV: 90.1 fL (ref 80.0–100.0)
Platelets: 167 10*3/uL (ref 150–400)
RBC: 6.58 MIL/uL — ABNORMAL HIGH (ref 4.22–5.81)
RDW: 14.5 % (ref 11.5–15.5)
WBC: 6 10*3/uL (ref 4.0–10.5)
nRBC: 0 % (ref 0.0–0.2)

## 2018-03-30 LAB — GLUCOSE, CAPILLARY
Glucose-Capillary: 105 mg/dL — ABNORMAL HIGH (ref 70–99)
Glucose-Capillary: 89 mg/dL (ref 70–99)
Glucose-Capillary: 87 mg/dL (ref 70–99)
Glucose-Capillary: 164 mg/dL — ABNORMAL HIGH (ref 70–99)

## 2018-03-30 LAB — HEPATIC FUNCTION PANEL
ALT: 35 U/L (ref 0–44)
AST: 23 U/L (ref 15–41)
Albumin: 3.4 g/dL — ABNORMAL LOW (ref 3.5–5.0)
Alkaline Phosphatase: 73 U/L (ref 38–126)
Bilirubin, Direct: 0.1 mg/dL (ref 0.0–0.2)
Indirect Bilirubin: 0.9 mg/dL (ref 0.3–0.9)
Total Bilirubin: 1 mg/dL (ref 0.3–1.2)
Total Protein: 6.4 g/dL — ABNORMAL LOW (ref 6.5–8.1)

## 2018-03-30 LAB — BASIC METABOLIC PANEL
Anion gap: 14 (ref 5–15)
BUN: 16 mg/dL (ref 8–23)
CO2: 25 mmol/L (ref 22–32)
Calcium: 8.8 mg/dL — ABNORMAL LOW (ref 8.9–10.3)
Chloride: 102 mmol/L (ref 98–111)
Creatinine, Ser: 0.59 mg/dL — ABNORMAL LOW (ref 0.61–1.24)
GFR calc Af Amer: >60 mL/min (ref >60)
GFR calc non Af Amer: >60 mL/min (ref >60)
Glucose, Bld: 124 mg/dL — ABNORMAL HIGH (ref 70–99)
Potassium: 4.3 mmol/L (ref 3.5–5.1)
Sodium: 141 mmol/L (ref 135–145)

## 2018-03-30 LAB — LIPID PANEL
CHOL/HDL RATIO: 7 ratio
Cholesterol: 181 mg/dL (ref 0–200)
HDL: 26 mg/dL — ABNORMAL LOW (ref >40)
LDL Cholesterol: 91 mg/dL (ref 0–99)
Triglycerides: 322 mg/dL — ABNORMAL HIGH (ref <150)
VLDL: 64 mg/dL — ABNORMAL HIGH (ref 0–40)

## 2018-03-30 LAB — HEMOGLOBIN A1C
Hgb A1c MFr Bld: 7.4 % — ABNORMAL HIGH (ref 4.8–5.6)
Mean Plasma Glucose: 165.68 mg/dL

## 2018-03-30 LAB — HEPARIN LEVEL (UNFRACTIONATED)
HEPARIN UNFRACTIONATED: 0.35 [IU]/mL (ref 0.30–0.70)
Heparin Unfractionated: 0.26 IU/mL — ABNORMAL LOW (ref 0.30–0.70)

## 2018-03-30 LAB — TROPONIN I: Troponin I: 0.35 ng/mL (ref <0.03)

## 2018-03-30 LAB — HIV ANTIBODY (ROUTINE TESTING W REFLEX): HIV Screen 4th Generation wRfx: NONREACTIVE

## 2018-03-30 SURGERY — LEFT HEART CATH AND CORONARY ANGIOGRAPHY
Anesthesia: LOCAL

## 2018-03-30 MED ORDER — HEPARIN (PORCINE) IN NACL 1000-0.9 UT/500ML-% IV SOLN
INTRAVENOUS | Status: DC | PRN
  Administered 2018-03-30: 500 mL

## 2018-03-30 MED ORDER — HEPARIN SODIUM (PORCINE) 1000 UNIT/ML IJ SOLN
INTRAMUSCULAR | Status: AC
  Filled 2018-03-30: qty 1

## 2018-03-30 MED ORDER — IOHEXOL 350 MG/ML SOLN
INTRAVENOUS | Status: DC | PRN
  Administered 2018-03-30: 110 mL via INTRA_ARTERIAL

## 2018-03-30 MED ORDER — MIDAZOLAM HCL 2 MG/2ML IJ SOLN
INTRAMUSCULAR | Status: AC
  Filled 2018-03-30: qty 2

## 2018-03-30 MED ORDER — AMLODIPINE BESYLATE 5 MG PO TABS
5.0000 mg | ORAL_TABLET | Freq: Every day | ORAL | Status: DC
  Administered 2018-03-30 – 2018-03-31 (×2): 5 mg via ORAL
  Filled 2018-03-30 (×2): qty 1

## 2018-03-30 MED ORDER — VERAPAMIL HCL 2.5 MG/ML IV SOLN
INTRAVENOUS | Status: DC | PRN
  Administered 2018-03-30: 10 mL via INTRA_ARTERIAL

## 2018-03-30 MED ORDER — VERAPAMIL HCL 2.5 MG/ML IV SOLN
INTRAVENOUS | Status: AC
  Filled 2018-03-30: qty 2

## 2018-03-30 MED ORDER — FENTANYL CITRATE (PF) 100 MCG/2ML IJ SOLN
INTRAMUSCULAR | Status: AC
  Filled 2018-03-30: qty 2

## 2018-03-30 MED ORDER — HEPARIN SODIUM (PORCINE) 1000 UNIT/ML IJ SOLN
INTRAMUSCULAR | Status: DC | PRN
  Administered 2018-03-30: 6500 [IU] via INTRAVENOUS

## 2018-03-30 MED ORDER — FENTANYL CITRATE (PF) 100 MCG/2ML IJ SOLN
INTRAMUSCULAR | Status: DC | PRN
  Administered 2018-03-30: 25 ug via INTRAVENOUS
  Administered 2018-03-30: 50 ug via INTRAVENOUS
  Administered 2018-03-30: 25 ug via INTRAVENOUS

## 2018-03-30 MED ORDER — ACETAMINOPHEN 325 MG PO TABS: 650.0000 mg | ORAL_TABLET | ORAL | Status: DC | PRN

## 2018-03-30 MED ORDER — LIDOCAINE HCL (PF) 1 % IJ SOLN
INTRAMUSCULAR | Status: AC
  Filled 2018-03-30: qty 30

## 2018-03-30 MED ORDER — MIDAZOLAM HCL 2 MG/2ML IJ SOLN
INTRAMUSCULAR | Status: DC | PRN
  Administered 2018-03-30: 2 mg via INTRAVENOUS
  Administered 2018-03-30 (×2): 1 mg via INTRAVENOUS

## 2018-03-30 MED ORDER — SODIUM CHLORIDE 0.9 % IV SOLN: 250.0000 mL | INTRAVENOUS | Status: DC | PRN

## 2018-03-30 MED ORDER — SODIUM CHLORIDE 0.9% FLUSH
3.0000 mL | Freq: Two times a day (BID) | INTRAVENOUS | Status: DC
  Administered 2018-03-30 – 2018-03-31 (×2): 3 mL via INTRAVENOUS

## 2018-03-30 MED ORDER — ONDANSETRON HCL 4 MG/2ML IJ SOLN: 4.0000 mg | Freq: Four times a day (QID) | INTRAMUSCULAR | Status: DC | PRN

## 2018-03-30 MED ORDER — CLOPIDOGREL BISULFATE 75 MG PO TABS
300.0000 mg | ORAL_TABLET | Freq: Once | ORAL | Status: AC
  Administered 2018-03-30: 300 mg via ORAL
  Filled 2018-03-30: qty 4

## 2018-03-30 MED ORDER — SODIUM CHLORIDE 0.9% FLUSH: 3.0000 mL | INTRAVENOUS | Status: DC | PRN

## 2018-03-30 MED ORDER — CLOPIDOGREL BISULFATE 75 MG PO TABS
75.0000 mg | ORAL_TABLET | Freq: Every day | ORAL | Status: DC
  Administered 2018-03-31: 75 mg via ORAL
  Filled 2018-03-30: qty 1

## 2018-03-30 MED ORDER — LIDOCAINE HCL (PF) 1 % IJ SOLN
INTRAMUSCULAR | Status: DC | PRN
  Administered 2018-03-30: 3 mL

## 2018-03-30 MED ORDER — ASPIRIN 81 MG PO CHEW
81.0000 mg | CHEWABLE_TABLET | Freq: Every day | ORAL | Status: DC
  Administered 2018-03-31: 81 mg via ORAL
  Filled 2018-03-30: qty 1

## 2018-03-30 MED ORDER — SODIUM CHLORIDE 0.9 % IV SOLN
INTRAVENOUS | Status: DC
  Administered 2018-03-30: 17:00:00 via INTRAVENOUS

## 2018-03-30 MED ORDER — ATORVASTATIN CALCIUM 80 MG PO TABS
80.0000 mg | ORAL_TABLET | Freq: Every day | ORAL | Status: DC
  Administered 2018-03-30: 80 mg via ORAL
  Filled 2018-03-30: qty 1

## 2018-03-30 MED ORDER — HEPARIN (PORCINE) IN NACL 1000-0.9 UT/500ML-% IV SOLN
INTRAVENOUS | Status: AC
  Filled 2018-03-30: qty 500

## 2018-03-30 SURGICAL SUPPLY — 12 items
CATH INFINITI 5FR ANG PIGTAIL (CATHETERS) ×2 IMPLANT
CATH OPTITORQUE TIG 4.0 5F (CATHETERS) ×2 IMPLANT
DEVICE RAD COMP TR BAND LRG (VASCULAR PRODUCTS) ×2 IMPLANT
GLIDESHEATH SLEND SS 6F .021 (SHEATH) ×2 IMPLANT
GUIDEWIRE INQWIRE 1.5J.035X260 (WIRE) ×1 IMPLANT
HOVERMATT SINGLE USE (MISCELLANEOUS) ×2 IMPLANT
INQWIRE 1.5J .035X260CM (WIRE) ×2
KIT HEART LEFT (KITS) ×2 IMPLANT
PACK CARDIAC CATHETERIZATION (CUSTOM PROCEDURE TRAY) ×2 IMPLANT
SYR MEDRAD MARK 7 150ML (SYRINGE) ×2 IMPLANT
TRANSDUCER W/STOPCOCK (MISCELLANEOUS) ×2 IMPLANT
TUBING CIL FLEX 10 FLL-RA (TUBING) ×2 IMPLANT

## 2018-03-30 NOTE — H&P (View-Only) (Signed)
Progress Note  Patient Name: Thomas [name] Date of Encounter: 03/30/2018  Primary Cardiologist:  Lauree Chandler, MD  Subjective   Complains that he is hungry, wants to get the cath done ASAP so he can eat.   No chest pain or SOB.  Inpatient Medications    Scheduled Meds: . [START ON 03/31/2018] aspirin EC  81 mg Oral Daily  . atorvastatin  40 mg Oral q1800  . metoprolol tartrate  25 mg Oral BID  . nitroGLYCERIN  1 inch Topical Q6H  . pantoprazole  40 mg Oral Daily  . sodium chloride flush  3 mL Intravenous Q12H   Continuous Infusions: . sodium chloride    . sodium chloride 1 mL/kg/hr (03/30/18 9449)  . heparin 1,900 Units/hr (03/29/18 2306)   PRN Meds: sodium chloride, acetaminophen, nitroGLYCERIN, ondansetron (ZOFRAN) IV, sodium chloride flush   Vital Signs    Vitals:   03/29/18 1641 03/29/18 2111 03/30/18 0504 03/30/18 0515  BP: (!) 150/87 (!) 144/88 (!) 161/100   Pulse: 96 99 99   Resp:  20 (!) 21   Temp: 98.4 F (36.9 C) 98.3 F (36.8 C) 98 F (36.7 C)   TempSrc: Oral  Oral   SpO2: 94% (!) 89% 92%   Weight: 131.1 kg   128.7 kg  Height: 5\' 9"  (1.753 m)       Intake/Output Summary (Last 24 hours) at 03/30/2018 0754 Last data filed at 03/30/2018 0615 Gross per 24 hour  Intake 1036.73 ml  Output -  Net 1036.73 ml   Filed Weights   03/29/18 1259 03/29/18 1641 03/30/18 0515  Weight: 131.1 kg 131.1 kg 128.7 kg    Telemetry    SR, ST, PVCs and 3 bt runs NSVT - Personally Reviewed  ECG    12/13, SR, HR 97, QT slightly longer than 12/11 at 384 - Personally Reviewed  Physical Exam   General: Well developed, well nourished, male appearing in no acute distress. Head: Normocephalic, atraumatic.  Neck: Supple without bruits, JVD not seen. Lungs:  Resp regular and unlabored, CTA. Heart: RRR, S1, S2, no S3, S4, or murmur; no rub. Abdomen: Soft, non-tender, non-distended with normoactive bowel sounds. No hepatomegaly. No rebound/guarding. No  obvious abdominal masses. Extremities: No clubbing, cyanosis, no edema. Distal radial pulses are 2+ bilaterally. Neuro: Alert and oriented X 3. Moves all extremities spontaneously. Psych: Normal affect.  Labs    Hematology Recent Labs  Lab 03/29/18 1304 03/30/18 0543  WBC 8.2 6.0  RBC 6.89* 6.58*  HGB 19.4* 18.1*  HCT 62.2* 59.3*  MCV 90.3 90.1  MCH 28.2 27.5  MCHC 31.2 30.5  RDW 15.3 14.5  PLT 188 167    Chemistry Recent Labs  Lab 03/29/18 1304 03/30/18 0543  NA 144 141  K 4.5 4.3  CL 101 102  CO2 30 25  GLUCOSE 116* 124*  BUN 16 16  CREATININE 0.80 0.59*  CALCIUM 9.3 8.8*  GFRNONAA >60 >60  GFRAA >60 >60  ANIONGAP 13 14    No results found for: ALT, AST, GGT, ALKPHOS, BILITOT  Cardiac Enzymes Recent Labs  Lab 03/29/18 1656 03/29/18 2113 03/30/18 0543  TROPONINI 0.81* 0.43* 0.35*    Recent Labs  Lab 03/29/18 1308  TROPIPOC 0.66*    Lab Results  Component Value Date   CHOL 181 03/30/2018   HDL 26 (L) 03/30/2018   LDLCALC 91 03/30/2018   TRIG 322 (H) 03/30/2018   CHOLHDL 7.0 03/30/2018     Radiology  No results found.   Cardiac Studies   ECHO:  ordered  Patient Profile     62 y.o. male w/ hx DM, morbid obesity, tob use, was admitted 12/11 for NSTEMI. Cath planned 12/12.  Assessment & Plan     Active Problems:   NSTEMI (non-ST elevated myocardial infarction) (HCC) - pain-free on ASA, heparin, high-dose statin, nitrates, BB - last pm, pt refused metoprolol, hopefully he will take it today - continue rx, cath today scheduled for 1:30, do earlier if possible    HTN:  - No dx HTN but BP has been consistently elevated since admission - continue BB as pt will allow    Dyslipidemia - profile above, will ck liver functions - low HDL and LDL above target (now 70) - Lipitor 40 mg qd is new    Tob use: - cessation encouraged     Signed, Rosaria Ferries , PA-C 7:54 AM 03/30/2018 Pager: [phone]  I have personally seen  and examined this patient. I agree with the assessment and plan as outlined above.  Admitted with unstable angina/NSTEMI. Continue ASA, IV heparin, statin, beta blocker. Cardiac cath later today. Smoking cessation recommended.   Lauree Chandler 03/30/2018 9:27 AM

## 2018-03-30 NOTE — Progress Notes (Addendum)
Progress Note  Patient Name: Thomas [name] Date of Encounter: 03/30/2018  Primary Cardiologist:  Lauree Chandler, MD  Subjective   Complains that he is hungry, wants to get the cath done ASAP so he can eat.   No chest pain or SOB.  Inpatient Medications    Scheduled Meds: . [START ON 03/31/2018] aspirin EC  81 mg Oral Daily  . atorvastatin  40 mg Oral q1800  . metoprolol tartrate  25 mg Oral BID  . nitroGLYCERIN  1 inch Topical Q6H  . pantoprazole  40 mg Oral Daily  . sodium chloride flush  3 mL Intravenous Q12H   Continuous Infusions: . sodium chloride    . sodium chloride 1 mL/kg/hr (03/30/18 2778)  . heparin 1,900 Units/hr (03/29/18 2306)   PRN Meds: sodium chloride, acetaminophen, nitroGLYCERIN, ondansetron (ZOFRAN) IV, sodium chloride flush   Vital Signs    Vitals:   03/29/18 1641 03/29/18 2111 03/30/18 0504 03/30/18 0515  BP: (!) 150/87 (!) 144/88 (!) 161/100   Pulse: 96 99 99   Resp:  20 (!) 21   Temp: 98.4 F (36.9 C) 98.3 F (36.8 C) 98 F (36.7 C)   TempSrc: Oral  Oral   SpO2: 94% (!) 89% 92%   Weight: 131.1 kg   128.7 kg  Height: 5\' 9"  (1.753 m)       Intake/Output Summary (Last 24 hours) at 03/30/2018 0754 Last data filed at 03/30/2018 0615 Gross per 24 hour  Intake 1036.73 ml  Output -  Net 1036.73 ml   Filed Weights   03/29/18 1259 03/29/18 1641 03/30/18 0515  Weight: 131.1 kg 131.1 kg 128.7 kg    Telemetry    SR, ST, PVCs and 3 bt runs NSVT - Personally Reviewed  ECG    12/13, SR, HR 97, QT slightly longer than 12/11 at 384 - Personally Reviewed  Physical Exam   General: Well developed, well nourished, male appearing in no acute distress. Head: Normocephalic, atraumatic.  Neck: Supple without bruits, JVD not seen. Lungs:  Resp regular and unlabored, CTA. Heart: RRR, S1, S2, no S3, S4, or murmur; no rub. Abdomen: Soft, non-tender, non-distended with normoactive bowel sounds. No hepatomegaly. No rebound/guarding. No  obvious abdominal masses. Extremities: No clubbing, cyanosis, no edema. Distal radial pulses are 2+ bilaterally. Neuro: Alert and oriented X 3. Moves all extremities spontaneously. Psych: Normal affect.  Labs    Hematology Recent Labs  Lab 03/29/18 1304 03/30/18 0543  WBC 8.2 6.0  RBC 6.89* 6.58*  HGB 19.4* 18.1*  HCT 62.2* 59.3*  MCV 90.3 90.1  MCH 28.2 27.5  MCHC 31.2 30.5  RDW 15.3 14.5  PLT 188 167    Chemistry Recent Labs  Lab 03/29/18 1304 03/30/18 0543  NA 144 141  K 4.5 4.3  CL 101 102  CO2 30 25  GLUCOSE 116* 124*  BUN 16 16  CREATININE 0.80 0.59*  CALCIUM 9.3 8.8*  GFRNONAA >60 >60  GFRAA >60 >60  ANIONGAP 13 14    No results found for: ALT, AST, GGT, ALKPHOS, BILITOT  Cardiac Enzymes Recent Labs  Lab 03/29/18 1656 03/29/18 2113 03/30/18 0543  TROPONINI 0.81* 0.43* 0.35*    Recent Labs  Lab 03/29/18 1308  TROPIPOC 0.66*    Lab Results  Component Value Date   CHOL 181 03/30/2018   HDL 26 (L) 03/30/2018   LDLCALC 91 03/30/2018   TRIG 322 (H) 03/30/2018   CHOLHDL 7.0 03/30/2018     Radiology  No results found.   Cardiac Studies   ECHO:  ordered  Patient Profile     62 y.o. male w/ hx DM, morbid obesity, tob use, was admitted 12/11 for NSTEMI. Cath planned 12/12.  Assessment & Plan     Active Problems:   NSTEMI (non-ST elevated myocardial infarction) (HCC) - pain-free on ASA, heparin, high-dose statin, nitrates, BB - last pm, pt refused metoprolol, hopefully he will take it today - continue rx, cath today scheduled for 1:30, do earlier if possible    HTN:  - No dx HTN but BP has been consistently elevated since admission - continue BB as pt will allow    Dyslipidemia - profile above, will ck liver functions - low HDL and LDL above target (now 70) - Lipitor 40 mg qd is new    Tob use: - cessation encouraged     Signed, Rosaria Ferries , PA-C 7:54 AM 03/30/2018 Pager: [phone]  I have personally seen  and examined this patient. I agree with the assessment and plan as outlined above.  Admitted with unstable angina/NSTEMI. Continue ASA, IV heparin, statin, beta blocker. Cardiac cath later today. Smoking cessation recommended.   Lauree Chandler 03/30/2018 9:27 AM

## 2018-03-30 NOTE — Plan of Care (Signed)
  Problem: Education: Goal: Knowledge of General Education information will improve Description: Including pain rating scale, medication(s)/side effects and non-pharmacologic comfort measures Outcome: Progressing   Problem: Clinical Measurements: Goal: Ability to maintain clinical measurements within normal limits will improve Outcome: Progressing Goal: Cardiovascular complication will be avoided Outcome: Progressing   

## 2018-03-30 NOTE — Interval H&P Note (Signed)
Cath Lab Visit (complete for each Cath Lab visit)  Clinical Evaluation Leading to the Procedure:   ACS: Yes.    Non-ACS:    Anginal Classification: CCS III  Anti-ischemic medical therapy: Maximal Therapy (2 or more classes of medications)  Non-Invasive Test Results: No non-invasive testing performed  Prior CABG: No previous CABG      History and Physical Interval Note:  03/30/2018 2:56 PM  Thomas [name]  has presented today for surgery, with the diagnosis of nstemi  The various methods of treatment have been discussed with the patient and family. After consideration of risks, benefits and other options for treatment, the patient has consented to  Procedure(s): LEFT HEART CATH AND CORONARY ANGIOGRAPHY (N/A) as a surgical intervention .  The patient's history has been reviewed, patient examined, no change in status, stable for surgery.  I have reviewed the patient's chart and labs.  Questions were answered to the patient's satisfaction.     Shelva Majestic

## 2018-03-30 NOTE — Progress Notes (Signed)
[place] for Heparin Indication: chest pain/ACS  Allergies  Allergen Reactions  . Codeine Nausea And Vomiting  . Doxycycline     Patient Measurements: Height: 5\' 9"  (175.3 cm) Weight: 283 lb 11.2 oz (128.7 kg) IBW/kg (Calculated) : 70.7 Heparin Dosing Weight: 101 kg  Vital Signs: Temp: 98 F (36.7 C) (12/13 0504) Temp Source: Oral (12/13 0504) BP: 161/100 (12/13 0504) Pulse Rate: 99 (12/13 0504)  Labs: Recent Labs    03/29/18 1304 03/29/18 1656 03/29/18 2113 03/30/18 0543  HGB 19.4*  --   --  18.1*  HCT 62.2*  --   --  59.3*  PLT 188  --   --  167  HEPARINUNFRC  --   --  <0.10* 0.26*  CREATININE 0.80  --   --   --   TROPONINI  --  0.81* 0.43*  --     Estimated Creatinine Clearance: 128.8 mL/min (by C-G formula based on SCr of 0.8 mg/dL).   Medical History: Past Medical History:  Diagnosis Date  . Diabetes mellitus without complication ([place])   . Snoring   . Tobacco abuse     Medications:  Await home med rec  Assessment: 62 y.o. M presented to [facility] ED - found to have elevated troponin and drove himself over to [facility] ED for possible cath - he refused transportation via ambulance. He was given ASA 324mg  and Nitroglycerin at [facility]. Pt reports that he wasn't given any Lovenox or Heparin. Pharmacy consulted to start heparin for NSTEMI. CBC wnl on admission Heparin level this am slightly low at 0.26 units/ml  Goal of Therapy:  Heparin level 0.3-0.7 units/ml Monitor platelets by anticoagulation protocol: Yes   Plan:  Heparin gtt 2050 units/hr Will f/u heparin level in 6 hours Daily heparin level and CBC  Sherlon Handing, PharmD, BCPS Clinical pharmacist  **Pharmacist phone directory can now be found on [email or web address] ([email or web address]).  Listed under [place]. 03/30/2018,7:00 AM

## 2018-03-30 NOTE — Progress Notes (Signed)
[place] for Heparin Indication: chest pain/ACS  Allergies  Allergen Reactions  . Codeine Nausea And Vomiting  . Doxycycline     Patient Measurements: Height: 5\' 9"  (175.3 cm) Weight: 283 lb 11.2 oz (128.7 kg) IBW/kg (Calculated) : 70.7 Heparin Dosing Weight: 101 kg  Vital Signs: Temp: 98 F (36.7 C) (12/13 0504) Temp Source: Oral (12/13 0504) BP: 161/100 (12/13 0504) Pulse Rate: 99 (12/13 0504)  Labs: Recent Labs    03/29/18 1304 03/29/18 1656 03/29/18 2113 03/30/18 0543 03/30/18 1330  HGB 19.4*  --   --  18.1*  --   HCT 62.2*  --   --  59.3*  --   PLT 188  --   --  167  --   HEPARINUNFRC  --   --  <0.10* 0.26* 0.35  CREATININE 0.80  --   --  0.59*  --   TROPONINI  --  0.81* 0.43* 0.35*  --     Estimated Creatinine Clearance: 128.8 mL/min (A) (by C-G formula based on SCr of 0.59 mg/dL (L)).   Medical History: Past Medical History:  Diagnosis Date  . Diabetes mellitus without complication ([place])   . Snoring   . Tobacco abuse     Assessment: 83 yoM admitted as NSTEMI on IV heparin. Heparin level therapeutic, cath planned for today.  Goal of Therapy:  Heparin level 0.3-0.7 units/ml Monitor platelets by anticoagulation protocol: Yes   Plan:  -Continue heparin 2050 units/hr -Daily anti-Xa level and CBC while on heparin  Arrie Senate, PharmD, BCPS Clinical Pharmacist [phone] Please check AMION for all [place] numbers 03/30/2018

## 2018-03-31 ENCOUNTER — Inpatient Hospital Stay (HOSPITAL_COMMUNITY): Payer: Medicare HMO

## 2018-03-31 DIAGNOSIS — I214 Non-ST elevation (NSTEMI) myocardial infarction: Secondary | ICD-10-CM

## 2018-03-31 DIAGNOSIS — I255 Ischemic cardiomyopathy: Secondary | ICD-10-CM

## 2018-03-31 DIAGNOSIS — Z720 Tobacco use: Secondary | ICD-10-CM

## 2018-03-31 DIAGNOSIS — E119 Type 2 diabetes mellitus without complications: Secondary | ICD-10-CM

## 2018-03-31 LAB — GLUCOSE, CAPILLARY: Glucose-Capillary: 182 mg/dL — ABNORMAL HIGH (ref 70–99)

## 2018-03-31 LAB — ECHOCARDIOGRAM COMPLETE
Height: 69 in
Weight: 4561.6 oz

## 2018-03-31 MED ORDER — ATORVASTATIN CALCIUM 80 MG PO TABS: 80.0000 mg | ORAL_TABLET | Freq: Every day | ORAL | 3 refills | Status: DC

## 2018-03-31 MED ORDER — NITROGLYCERIN 0.4 MG SL SUBL: 0.4000 mg | SUBLINGUAL_TABLET | SUBLINGUAL | 0 refills | Status: DC | PRN

## 2018-03-31 MED ORDER — ISOSORBIDE MONONITRATE ER 30 MG PO TB24: 30.0000 mg | ORAL_TABLET | Freq: Every day | ORAL | Status: DC

## 2018-03-31 MED ORDER — CLOPIDOGREL BISULFATE 75 MG PO TABS: 75.0000 mg | ORAL_TABLET | Freq: Every day | ORAL | 9 refills | Status: DC

## 2018-03-31 MED ORDER — ISOSORBIDE MONONITRATE ER 30 MG PO TB24: 30.0000 mg | ORAL_TABLET | Freq: Every day | ORAL | 3 refills | Status: DC

## 2018-03-31 MED ORDER — AMLODIPINE BESYLATE 5 MG PO TABS: 5.0000 mg | ORAL_TABLET | Freq: Every day | ORAL | 3 refills | Status: DC

## 2018-03-31 MED ORDER — METOPROLOL TARTRATE 25 MG PO TABS: 25.0000 mg | ORAL_TABLET | Freq: Two times a day (BID) | ORAL | 3 refills | Status: DC

## 2018-03-31 MED ORDER — PANTOPRAZOLE SODIUM 40 MG PO TBEC: 40.0000 mg | DELAYED_RELEASE_TABLET | Freq: Every day | ORAL | 3 refills | Status: DC

## 2018-03-31 MED ORDER — CARVEDILOL 6.25 MG PO TABS: 6.2500 mg | ORAL_TABLET | Freq: Two times a day (BID) | ORAL | Status: DC

## 2018-03-31 NOTE — Progress Notes (Signed)
  Echocardiogram 2D Echocardiogram has been performed.  Nessie Nong T Kynzlee Hucker 03/31/2018, 10:11 AM

## 2018-03-31 NOTE — Progress Notes (Signed)
Patient very upset, restless and stating he is leaving AMA before 1230P. Have paged NP Glenford Peers to update. Will continue to monitor.

## 2018-03-31 NOTE — Discharge Instructions (Signed)
Aspirin and Your Heart Aspirin is a medicine that affects the way blood clots. Aspirin can be used to help reduce the risk of blood clots, heart attacks, and other heart-related problems. Should I take aspirin? Your health care provider will help you determine whether it is safe and beneficial for you to take aspirin daily. Taking aspirin daily may be beneficial if you:  Have had a heart attack or chest pain.  Have undergone open heart surgery such as coronary artery bypass surgery (CABG).  Have had coronary angioplasty.  Have experienced a stroke or transient ischemic attack (TIA).  Have peripheral vascular disease (PVD).  Have chronic heart rhythm problems such as atrial fibrillation.  Are there any risks of taking aspirin daily? Daily use of aspirin can increase your risk of side effects. Some of these include:  Bleeding. Bleeding problems can be minor or serious. An example of a minor problem is a cut that does not stop bleeding. An example of a more serious problem is stomach bleeding or bleeding into the brain. Your risk of bleeding is increased if you are also taking non-steroidal anti-inflammatory medicine (NSAIDs).  Increased bruising.  Upset stomach.  An allergic reaction. People who have nasal polyps have an increased risk of developing an aspirin allergy.  What are some guidelines I should follow when taking aspirin?  Take aspirin only as directed by your health care provider. Make sure you understand how much you should take and what form you should take. The two forms of aspirin are: ? Non-enteric-coated. This type of aspirin does not have a coating and is absorbed quickly. Non-enteric-coated aspirin is usually recommended for people with chest pain. This type of aspirin also comes in a chewable form. ? Enteric-coated. This type of aspirin has a special coating that releases the medicine very slowly. Enteric-coated aspirin causes less stomach upset than  non-enteric-coated aspirin. This type of aspirin should not be chewed or crushed.  Drink alcohol in moderation. Drinking alcohol increases your risk of bleeding. When should I seek medical care?  You have unusual bleeding or bruising.  You have stomach pain.  You have an allergic reaction. Symptoms of an allergic reaction include: ? Hives. ? Itchy skin. ? Swelling of the lips, tongue, or face.  You have ringing in your ears. When should I seek immediate medical care?  Your bowel movements are bloody, dark red, or black in color.  You vomit or cough up blood.  You have blood in your urine.  You cough, wheeze, or feel short of breath. If you have any of the following symptoms, this is an emergency. Do not wait to see if the pain will go away. Get medical help at once. Call your local emergency services (911 in the [place] Do not drive yourself to the hospital.  You have severe chest pain, especially if the pain is crushing or pressure-like and spreads to the arms, back, neck, or jaw.  You have stroke-like symptoms, such as: ? Loss of vision. ? Difficulty talking. ? Numbness or weakness on one side of your body. ? Numbness or weakness in your arm or leg. ? Not thinking clearly or feeling confused.  This information is not intended to replace advice given to you by your health care provider. Make sure you discuss any questions you have with your health care provider. Document Released: 03/17/2008 Document Revised: 08/12/2015 Document Reviewed: 07/10/2013 Elsevier Interactive Patient Education  2018 [place].   Coronary Artery Disease, Male Coronary artery disease (CAD)  is a condition in which the arteries that lead to the heart (coronary arteries) become narrow or blocked. The narrowing or blockage can lead to decreased blood flow to the heart. Prolonged reduced blood flow can cause a heart attack (myocardial infarction or MI). This condition may also be called coronary heart  disease. Because CAD is the leading cause of death in men, it is important to understand what causes this condition and how it is treated. What are the causes? CAD is most often caused by atherosclerosis. This is the buildup of fat and cholesterol (plaque) on the inside of the arteries. Over time, the plaque may narrow or block the artery, reducing blood flow to the heart. Plaque can also become weak and break off within a coronary artery and cause a sudden blockage. Other less common causes of CAD include:  An embolism or blood clot in a coronary artery.  A tearing of the artery (spontaneous coronary artery dissection).  An aneurysm.  Inflammation (vasculitis) in the artery wall.  What increases the risk? The following factors may make you more likely to develop this condition:  Age. Men over age 21 are at a greater risk of CAD.  Family history of CAD.  Gender. Men often develop CAD earlier in life than women.  High blood pressure (hypertension).  Diabetes.  High cholesterol levels.  Tobacco use.  Excessive alcohol use.  Lack of exercise.  A diet high in saturated and trans fats, such as fried food and processed meat.  Other possible risk factors include:  High stress levels.  Depression.  Obesity.  Sleep apnea.  What are the signs or symptoms? Many people do not have any symptoms during the early stages of CAD. As the condition progresses, symptoms may include:  Chest pain (angina). The pain can: ? Feel like a crushing or squeezing, or a tightness, pressure, fullness, or heaviness in the chest. ? Last more than a few minutes or can stop and recur. The pain tends to get worse with exercise or stress and to fade with rest.  Pain in the arms, neck, jaw, or back.  Unexplained heartburn or indigestion.  Shortness of breath.  Nausea or vomiting.  Sudden light-headedness.  Sudden cold sweats.  Fluttering or fast heartbeat (palpitations).  How is this  diagnosed? This condition is diagnosed based on:  Your family and medical history.  A physical exam.  Tests, including: ? A test to check the electrical signals in your heart (electrocardiogram). ? Exercise stress test. This looks for signs of blockage when the heart is stressed with exercise, such as running on a treadmill. ? Pharmacologic stress test. This test looks for signs of blockage when the heart is being stressed with a medicine. ? Blood tests. ? Coronary angiogram. This is a procedure to look at the coronary arteries to see if there is any blockage. During this test, a dye is injected into your arteries so they appear on an X-ray. ? A test that uses sound waves to take a picture of your heart (echocardiogram). ? Chest X-ray.  How is this treated? This condition may be treated by:  Healthy lifestyle changes to reduce risk factors.  Medicines such as: ? Antiplatelet medicines and blood-thinning medicines, such as aspirin. These help to prevent blood clots. ? Nitroglycerin. ? Blood pressure medicines. ? Cholesterol-lowering medicine.  Coronary angioplasty and stenting. During this procedure, a thin, flexible tube is inserted through a blood vessel and into a blocked artery. A balloon or similar device  on the end of the tube is inflated to open up the artery. In some cases, a small, mesh tube (stent) is inserted into the artery to keep it open.  Coronary artery bypass surgery. During this surgery, veins or arteries from other parts of the body are used to create a bypass around the blockage and allow blood to reach your heart.  Follow these instructions at home: Medicines  Take over-the-counter and prescription medicines only as told by your health care provider.  Do not take the following medicines unless your health care provider approves: ? NSAIDs, such as ibuprofen, naproxen, or celecoxib. ? Vitamin supplements that contain vitamin A, vitamin E, or  both. Lifestyle  Follow an exercise program approved by your health care provider. Aim for 150 minutes of moderate exercise or 75 minutes of vigorous exercise each week.  Maintain a healthy weight or lose weight as approved by your health care provider.  Rest when you are tired.  Learn to manage stress or try to limit your stress. Ask your health care provider for suggestions if you need help.  Get screened for depression and seek treatment, if needed.  Do not use any products that contain nicotine or tobacco, such as cigarettes and e-cigarettes. If you need help quitting, ask your health care provider.  Do not use illegal drugs. Eating and drinking  Follow a heart-healthy diet. A dietitian can help educate you about healthy food options and changes. In general, eat plenty of fruits and vegetables, lean meats, and whole grains.  Avoid foods high in: ? Sugar. ? Salt (sodium). ? Saturated fat, such as processed or fatty meat. ? Trans fat, such as fried foods.  Use healthy cooking methods such as roasting, grilling, broiling, baking, poaching, steaming, or stir-frying.  If you drink alcohol, and your health care provider approves, limit your alcohol intake to no more than 2 drinks per day. One drink equals 12 ounces of beer, 5 ounces of wine, or 1 ounces of hard liquor. General instructions  Manage any other health conditions, such as hypertension and diabetes. These conditions affect your heart.  Your health care provider may ask you to monitor your blood pressure. Ideally, your blood pressure should be below 130/80.  Keep all follow-up visits as told by your health care provider. This is important. Get help right away if:  You have pain in your chest, neck, arm, jaw, stomach, or back that: ? Lasts more than a few minutes. ? Is recurring. ? Is not relieved by taking medicine under your tongue (sublingualnitroglycerin).  You have too much (profuse) sweating without  cause.  You have unexplained: ? Heartburn or indigestion. ? Shortness of breath or difficulty breathing. ? Fluttering or fast heartbeat (palpitations). ? Nausea or vomiting. ? Fatigue. ? Feelings of nervousness or anxiety. ? Weakness. ? Diarrhea.  You have sudden light-headedness or dizziness.  You faint.  You feel like hurting yourself or think about taking your own life. These symptoms may represent a serious problem that is an emergency. Do not wait to see if the symptoms will go away. Get medical help right away. Call your local emergency services (911 in the [place] Do not drive yourself to the hospital. Summary  Coronary artery disease (CAD) is a process in which the arteries that lead to the heart (coronary arteries) become narrow or blocked. The narrowing or blockage can lead to a heart attack.  Many people do not have any symptoms during the early stages of CAD. This is  called "silent CAD."  CAD can be treated with lifestyle changes, medicines, surgery, or a combination of these treatments. This information is not intended to replace advice given to you by your health care provider. Make sure you discuss any questions you have with your health care provider. Document Released: 10/30/2013 Document Revised: 03/25/2016 Document Reviewed: 03/25/2016 Elsevier Interactive Patient Education  [employer].

## 2018-03-31 NOTE — Discharge Summary (Addendum)
Discharge Summary    Patient ID: Thomas [name] MRN: [id number]; DOB: October 02, 1955  Admit date: 03/29/2018 Discharge date: 03/31/2018  Primary Care Provider: [name]  Primary Cardiologist: Thomas Chandler, MD  Discharge Diagnoses    Principal Problem:   NSTEMI (non-ST elevated myocardial infarction) [facility]) Active Problems:   Morbid obesity ([place])   Ischemic cardiomyopathy   Tobacco abuse   DM (diabetes mellitus) (HCC)  Allergies Allergies  Allergen Reactions  . Codeine Nausea And Vomiting  . Doxycycline    Diagnostic Studies/Procedures    Cardiac catheterization 03/30/2018:  Prox Cx lesion is 30% stenosed.  Ost 2nd Mrg to 2nd Mrg lesion is 90% stenosed.  2nd Mrg lesion is 90% stenosed.  Prox LAD lesion is 30% stenosed.   Moderate global LV dysfunction with an ejection fraction of 35 to 40%.  LVEDP 13 mm.  There is evidence for coronary calcification.  The LAD has 30% proximal stenosis.  The Circumflex vessel has mild 30% narrowing prior to giving off 3 marginal vessels and the AV groove circumflex.  The second marginal branch has diffuse disease with 90% near ostial to proximal stenosis and diffuse 90% mid stenoses.  The RCA is a normal dominant vessel.  RECOMMENDATION: Due to the patient's large body habitus, visualization of vessels was difficult.  Recommend an initial aggressive medical therapy regimen with high potency statin therapy with target LDL less than 70, beta-blocker, amlodipine, in addition to nitrate therapy.  Since cardiac enzymes are mildly positive consistent with a NSTEMI recommend initial dual antiplatelet therapy with aspirin/Plavix even though stenting was not performed.  If patient fails medical therapy consider PCI to the diffusely diseased second marginal branch of the circumflex.  Smoking cessation is imperative.  _____________  Echocardiogram 03/31/2018:   Study Conclusions  - Left ventricle: The cavity size  was moderately to severely   dilated. Systolic function was severely reduced. The estimated   ejection fraction was in the range of 20% to 25%. Diffuse   hypokinesis. There is severe hypokinesis of the anteroseptal and   anterior myocardium. Indeterminate diastolic function. - Aortic valve: Mildly to moderately calcified annulus. Trileaflet.   Mean gradient (S): 5 mm Hg. Peak gradient (S): 9 mm Hg. Valve   area (Vmean): 1.63 cm^2. - Left atrium: The atrium was at the upper limits of normal in   size. - Right atrium: Central venous pressure (est): 8 mm Hg. - Tricuspid valve: There was trivial regurgitation. - Pulmonary arteries: Systolic pressure could not be accurately   estimated. - Pericardium, extracardiac: A prominent pericardial fat pad was   present.  History of Present Illness     Thomas [name] is a 63 y.o. male with history of diabetes mellitus, 50-pack-year tobacco smoking, snoring and morbid obesity presented for chest pain.  Remote stress test in 2011 at [place] was low risk. He smokes 1 pack a day since age 4.  Thomas [name] has been having exertional left-sided chest pressure for past 10 days pior to admission. No associated shortness of breath, diaphoresis, nausea or radiation. Relieved with rest in 10 to 15 minutes. Gradual worsening. He went to [facility] where noted elevated troponin and requested transfer to [facility] however he declined and wife brought him over here. Recently had sleep study which was inconclusive per patient.  In the ED, here he has elevated blood pressure, point-of-care troponin 0.66. Hemoglobin 19.4.  Electrolyte and serum creatinine normal.  EKG shows sinus rhythm at rate of 100 bpm and  anterior Q waves-personally reviewed.  He denied orthopnea, PND, syncope, dizziness, lower extremity edema or melena.  He was started on aspirin, statin and beta-blocker.  An echocardiogram was ordered and the plan was for cardiac catheterization for further  evaluation.  Hospital Course     Cath performed on 03/30/2018 which showed moderate global LV dysfunction with an ejection fraction of 35 to 40% per cath report.  There was evidence of coronary calcification.The LAD has 30% proximal stenosis. The Circumflex vessel has mild 30% narrowing prior to giving off 3 marginal vessels and the AV groove circumflex.  The second marginal branch has diffuse disease with 90% near ostial to proximal stenosis and diffuse 90% mid stenoses. The RCA is a normal dominant vessel.  RECOMMENDATIONS: Due to the patient's large body habitus, visualization of vessels was difficult. Recommend an initial aggressive medical therapy regimen with high potency statin therapy with target LDL less than 70, beta-blocker, amlodipine, in addition to nitrate therapy. Since cardiac enzymes are mildly positive consistent with a NSTEMI recommend initial dual antiplatelet therapy with aspirin/Plavix even though stenting was not performed. If patient fails medical therapy consider PCI to the diffusely diseased second marginal branch of the circumflex. Smoking cessation is imperative.  Medication plan:Carvedilol 6.125 mg twice daily, Plavix 75 mg daily, ASA 81 daily, metoprolol 25 mg twice daily and Imdur 30 mg daily  Consultants: None    Patient has been seen and examined by Thomas [name] who feels that he is stable and ready for discharge today, 03/31/2018.  Cath site unremarkable.  Will send staff message for [facility] follow-up appointment with Thomas [name] team.  They will call the patient with date and time of appointment once made. _____________  Discharge Vitals Blood pressure (!) 154/82, pulse 84, temperature 97.7 F (36.5 C), temperature source Oral, resp. rate 18, height 5\' 9"  (1.753 m), weight 129.3 kg, SpO2 91 %.  Filed Weights   03/29/18 1641 03/30/18 0515 03/31/18 0542  Weight: 131.1 kg 128.7 kg 129.3 kg    Labs & Radiologic Studies    CBC Recent Labs    03/29/18 1304  03/30/18 0543  WBC 8.2 6.0  HGB 19.4* 18.1*  HCT 62.2* 59.3*  MCV 90.3 90.1  PLT 188 938   Basic Metabolic Panel Recent Labs    03/29/18 1304 03/30/18 0543  NA 144 141  K 4.5 4.3  CL 101 102  CO2 30 25  GLUCOSE 116* 124*  BUN 16 16  CREATININE 0.80 0.59*  CALCIUM 9.3 8.8*   Liver Function Tests Recent Labs    03/30/18 0543  AST 23  ALT 35  ALKPHOS 73  BILITOT 1.0  PROT 6.4*  ALBUMIN 3.4*   Cardiac Enzymes Recent Labs    03/29/18 1656 03/29/18 2113 03/30/18 0543  TROPONINI 0.81* 0.43* 0.35*   Hemoglobin A1C Recent Labs    03/30/18 0543  HGBA1C 7.4*   Fasting Lipid Panel Recent Labs    03/30/18 0543  CHOL 181  HDL 26*  LDLCALC 91  TRIG 322*  CHOLHDL 7.0   Thyroid Function Tests Recent Labs    03/29/18 1656  TSH 1.080   _____________  No results found. Disposition   Pt is being discharged home today in good condition.  Follow-up Plans & Appointments    Follow-up Information    [facility] Follow up.   Why:  Office will contact you with date and time of your follow-up appointment.  Expect this appoint to be in  the next 7 to 10 days. Contact information: [place] [address] [phone]         Discharge Instructions    (HEART FAILURE PATIENTS) Call MD:  Anytime you have any of the following symptoms: 1) 3 pound weight gain in 24 hours or 5 pounds in 1 week 2) shortness of breath, with or without a dry hacking cough 3) swelling in the hands, feet or stomach 4) if you have to sleep on extra pillows at night in order to breathe.   Complete by:  As directed    Call MD for:  difficulty breathing, headache or visual disturbances   Complete by:  As directed    Call MD for:  extreme fatigue   Complete by:  As directed    Call MD for:  hives   Complete by:  As directed    Call MD for:  persistant dizziness or light-headedness   Complete by:  As  directed    Call MD for:  persistant nausea and vomiting   Complete by:  As directed    Call MD for:  redness, tenderness, or signs of infection (pain, swelling, redness, odor or green/yellow discharge around incision site)   Complete by:  As directed    Call MD for:  severe uncontrolled pain   Complete by:  As directed    Call MD for:  temperature >100.4   Complete by:  As directed    Diet - low sodium heart healthy   Complete by:  As directed    Discharge instructions   Complete by:  As directed    PLEASE DO NOT Emma!!!!! Also keep a log of you blood pressures and bring back to your follow up appt. Please call the office with any questions.   Patients taking blood thinners should generally stay away from medicines like ibuprofen, Advil, Motrin, naproxen, and Aleve due to risk of stomach bleeding. You may take Tylenol as directed or talk to your primary doctor about alternatives.  Some studies suggest Prilosec/Omeprazole interacts with Plavix. We changed your Prilosec/Omeprazole to the equivalent dose of Protonix for less chance of interaction.   No driving for 3 days. No lifting over 5 lbs for 1 week. No sexual activity for 1 week. Keep procedure site clean & dry. If you notice increased pain, swelling, bleeding or pus, call/return!  You may shower, but no soaking baths/hot tubs/pools for 1 week.   If you notice any bleeding such as blood in stool, black tarry stools, blood in urine, nosebleeds or any other unusual bleeding, call your doctor immediately.   Increase activity slowly   Complete by:  As directed       Discharge Medications   Allergies as of 03/31/2018      Reactions   Codeine Nausea And Vomiting   Doxycycline       Medication List    STOP taking these medications   omeprazole 40 MG capsule Commonly known as:  PRILOSEC Replaced by:  pantoprazole 40 MG tablet     TAKE these medications   amLODipine 5 MG tablet Commonly known as:   NORVASC Take 1 tablet (5 mg total) by mouth daily. Start taking on:  April 01, 2018   aspirin EC 81 MG tablet Take 81 mg by mouth daily.   atorvastatin 80 MG tablet Commonly known as:  LIPITOR Take 1 tablet (80 mg total) by mouth daily at 6 PM.   clopidogrel  75 MG tablet Commonly known as:  PLAVIX Take 1 tablet (75 mg total) by mouth daily with breakfast. Start taking on:  April 01, 2018   INVOKANA 300 MG Tabs tablet Generic drug:  canagliflozin Take 300 mg by mouth daily before breakfast.   isosorbide mononitrate 30 MG 24 hr tablet Commonly known as:  IMDUR Take 1 tablet (30 mg total) by mouth daily.   metFORMIN 500 MG tablet Commonly known as:  GLUCOPHAGE Take 500-1,000 mg by mouth See admin instructions. Take 500 mg in the morning and 1000 mg in the evening   metoprolol tartrate 25 MG tablet Commonly known as:  LOPRESSOR Take 1 tablet (25 mg total) by mouth 2 (two) times daily.   nitroGLYCERIN 0.4 MG SL tablet Commonly known as:  NITROSTAT Place 1 tablet (0.4 mg total) under the tongue every 5 (five) minutes as needed for chest pain.   pantoprazole 40 MG tablet Commonly known as:  PROTONIX Take 1 tablet (40 mg total) by mouth daily. Start taking on:  April 01, 2018 Replaces:  omeprazole 40 MG capsule        Acute coronary syndrome (MI, NSTEMI, STEMI, etc) this admission?: Yes.     AHA/ACC Clinical Performance & Quality Measures: 1. Aspirin prescribed? - Yes 2. ADP Receptor Inhibitor (Plavix/Clopidogrel, Brilinta/Ticagrelor or Effient/Prasugrel) prescribed (includes medically managed patients)? - Yes 3. Beta Blocker prescribed? - Yes 4. High Intensity Statin (Lipitor 40-80mg  or Crestor 20-40mg ) prescribed? - Yes 5. EF assessed during THIS hospitalization? - Yes 6. For EF <40%, was ACEI/ARB prescribed? - Yes 7. For EF <40%, Aldosterone Antagonist (Spironolactone or Eplerenone) prescribed? - Not Applicable (EF >/= 52%) 8. Cardiac Rehab Phase II ordered  (Included Medically managed Patients)? - Yes   Outstanding Labs/Studies   BMET   Duration of Discharge Encounter   Greater than 30 minutes including physician time.  Signed, Kathyrn Drown, NP 03/31/2018, 3:58 PM  Attending Note:   The patient was seen and examined.  Agree with assessment and plan as noted above.  Changes made to the above note as needed.  Patient seen and independently examined with  Kathyrn Drown, NP.   We discussed all aspects of the encounter. I agree with the assessment and plan as stated above.    1.  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 He is done well on medical therapy. He will be discharged on carvedilol 6 to 1.25 mg twice a day Plavix 75 mg a day  Aspirin  81 mg a day Metoprolol 25, BID Imdur 30    I have spent a total of 40 minutes with patient reviewing hospital  notes , telemetry, EKGs, labs and examining patient as well as establishing an assessment and plan that was discussed with the patient. > 50% of time was spent in direct patient care.    Thayer Headings, Brooke Bonito., MD, [facility] 04/01/2018, 8:07 PM [address],  [place] Pager [phone]

## 2018-03-31 NOTE — Progress Notes (Signed)
Progress Note  Patient Name: Thomas [name] Date of Encounter: 03/31/2018  Primary Cardiologist: Lauree Chandler, MD    Subjective   62 year old gentleman who was admitted with a non-ST segment elevation myocardial infarction.  Heart catheterization yesterday revealed moderate left ventricular dysfunction with an ejection fraction of 35 to 40%.  Left ventricular end-diastolic pressure is 13 mm. The LAD had a 30% proximal stenosis. Left circumflex artery had a 30% proximal stenosis.  The second obtuse marginal artery has a 90% near ostial/proximal stenosis and diffuse 90% mid stenosis.  Because of the relatively small size of the vessel, patient's body habitus and marginal visualization of the vessels, it was decided that medical therapy would be best. Dr. Claiborne Billings  recommended aspirin and Plavix.  Inpatient Medications    Scheduled Meds: . amLODipine  5 mg Oral Daily  . aspirin  81 mg Oral Daily  . atorvastatin  80 mg Oral q1800  . clopidogrel  75 mg Oral Q breakfast  . metoprolol tartrate  25 mg Oral BID  . nitroGLYCERIN  1 inch Topical Q6H  . pantoprazole  40 mg Oral Daily  . sodium chloride flush  3 mL Intravenous Q12H   Continuous Infusions: . sodium chloride Stopped (03/30/18 2300)  . sodium chloride     PRN Meds: sodium chloride, acetaminophen, nitroGLYCERIN, ondansetron (ZOFRAN) IV, sodium chloride flush   Vital Signs    Vitals:   03/30/18 1813 03/30/18 2020 03/31/18 0542 03/31/18 0542  BP:  (!) 157/91  (!) 154/82  Pulse:  91  84  Resp: 18     Temp:  98.6 F (37 C)  97.7 F (36.5 C)  TempSrc:  Oral  Oral  SpO2:  98%  91%  Weight:   129.3 kg   Height:        Intake/Output Summary (Last 24 hours) at 03/31/2018 1407 Last data filed at 03/31/2018 0653 Gross per 24 hour  Intake 1200 ml  Output -  Net 1200 ml   Filed Weights   03/29/18 1641 03/30/18 0515 03/31/18 0542  Weight: 131.1 kg 128.7 kg 129.3 kg    Telemetry    nsr  - Personally  Reviewed  ECG     nsr  - Personally Reviewed  Physical Exam   GEN: No acute distress.   Neck: No JVD Cardiac: RRR, no murmurs, rubs, or gallops.  Respiratory: Clear to auscultation bilaterally. GI: Soft, nontender, non-distended  MS: No edema; No deformity. Neuro:  Nonfocal  Psych: Normal affect   Labs    Chemistry Recent Labs  Lab 03/29/18 1304 03/30/18 0543  NA 144 141  K 4.5 4.3  CL 101 102  CO2 30 25  GLUCOSE 116* 124*  BUN 16 16  CREATININE 0.80 0.59*  CALCIUM 9.3 8.8*  PROT  --  6.4*  ALBUMIN  --  3.4*  AST  --  23  ALT  --  35  ALKPHOS  --  73  BILITOT  --  1.0  GFRNONAA >60 >60  GFRAA >60 >60  ANIONGAP 13 14     Hematology Recent Labs  Lab 03/29/18 1304 03/30/18 0543  WBC 8.2 6.0  RBC 6.89* 6.58*  HGB 19.4* 18.1*  HCT 62.2* 59.3*  MCV 90.3 90.1  MCH 28.2 27.5  MCHC 31.2 30.5  RDW 15.3 14.5  PLT 188 167    Cardiac Enzymes Recent Labs  Lab 03/29/18 1656 03/29/18 2113 03/30/18 0543  TROPONINI 0.81* 0.43* 0.35*    Recent Labs  Lab  03/29/18 1308  TROPIPOC 0.66*     BNPNo results for input(s): BNP, PROBNP in the last 168 hours.   DDimer No results for input(s): DDIMER in the last 168 hours.   Radiology    No results found.  Cardiac Studies     Patient Profile     62 y.o. male    Assessment & Plan    1.  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 He is done well on medical therapy. He will be discharged on carvedilol 6 to 1.25 mg twice a day Plavix 75 mg a day  Aspirin  81 mg a day Metoprolol 25, BID Imdur 30   For questions or updates, please contact [place] Please consult [email or web address] for contact info under        Signed, Mertie Moores, MD  03/31/2018, 2:07 PM

## 2018-04-02 ENCOUNTER — Encounter (HOSPITAL_COMMUNITY): Payer: Self-pay | Admitting: Cardiovascular Disease

## 2018-04-02 DIAGNOSIS — Z299 Encounter for prophylactic measures, unspecified: Secondary | ICD-10-CM | POA: Diagnosis not present

## 2018-04-02 DIAGNOSIS — G4733 Obstructive sleep apnea (adult) (pediatric): Secondary | ICD-10-CM | POA: Diagnosis not present

## 2018-04-02 DIAGNOSIS — I2510 Atherosclerotic heart disease of native coronary artery without angina pectoris: Secondary | ICD-10-CM | POA: Diagnosis not present

## 2018-04-02 DIAGNOSIS — F17210 Nicotine dependence, cigarettes, uncomplicated: Secondary | ICD-10-CM | POA: Diagnosis not present

## 2018-04-02 DIAGNOSIS — I219 Acute myocardial infarction, unspecified: Secondary | ICD-10-CM | POA: Diagnosis not present

## 2018-04-02 DIAGNOSIS — Z6841 Body Mass Index (BMI) 40.0 and over, adult: Secondary | ICD-10-CM | POA: Diagnosis not present

## 2018-04-03 ENCOUNTER — Other Ambulatory Visit: Payer: Self-pay

## 2018-04-03 NOTE — Patient Outreach (Signed)
[place] [facility]) Care Management  04/03/2018  Thomas [name] Sep 04, 1955 [id number]   Referral Date: 04/03/18 Referral Source: [employer] Report Date of Admission: 03/29/18 Diagnosis: MI Date of Discharge: 03/31/18 Facility: [place]:  [facility]  Outreach attempt: spoke with patient.  He states he is doing well since being discharged. Patient saw PCP on yesterday.  He states he is waiting on call from Cardiologist for follow up.  Patient lives in the home with wife and child. Patient is independent with all aspects of care.  Patient denies any problems with medications and is able to review medications.  Discussed with patient signs of repeat MI and seeking medical attention.  Also discussed with patient importance of watching heart cath site for infection.  He verbalized understanding and denies any needs presently.    Plan: RN CM will close case.     Jone Baseman, RN, MSN [facility] Care Management Care Management Coordinator Direct Line [phone] Toll Free: [phone]  Fax: [phone]

## 2018-05-09 DIAGNOSIS — R6889 Other general symptoms and signs: Secondary | ICD-10-CM | POA: Diagnosis not present

## 2018-05-09 DIAGNOSIS — R52 Pain, unspecified: Secondary | ICD-10-CM | POA: Diagnosis not present

## 2018-05-09 DIAGNOSIS — Z299 Encounter for prophylactic measures, unspecified: Secondary | ICD-10-CM | POA: Diagnosis not present

## 2018-05-09 DIAGNOSIS — F17210 Nicotine dependence, cigarettes, uncomplicated: Secondary | ICD-10-CM | POA: Diagnosis not present

## 2018-05-09 DIAGNOSIS — Z6841 Body Mass Index (BMI) 40.0 and over, adult: Secondary | ICD-10-CM | POA: Diagnosis not present

## 2018-05-09 DIAGNOSIS — I7389 Other specified peripheral vascular diseases: Secondary | ICD-10-CM | POA: Diagnosis not present

## 2018-05-09 DIAGNOSIS — I219 Acute myocardial infarction, unspecified: Secondary | ICD-10-CM | POA: Diagnosis not present

## 2018-05-22 DIAGNOSIS — G4733 Obstructive sleep apnea (adult) (pediatric): Secondary | ICD-10-CM | POA: Diagnosis not present

## 2018-05-22 DIAGNOSIS — I10 Essential (primary) hypertension: Secondary | ICD-10-CM | POA: Diagnosis not present

## 2018-05-22 DIAGNOSIS — E1165 Type 2 diabetes mellitus with hyperglycemia: Secondary | ICD-10-CM | POA: Diagnosis not present

## 2018-05-22 DIAGNOSIS — Z6841 Body Mass Index (BMI) 40.0 and over, adult: Secondary | ICD-10-CM | POA: Diagnosis not present

## 2018-05-22 DIAGNOSIS — Z299 Encounter for prophylactic measures, unspecified: Secondary | ICD-10-CM | POA: Diagnosis not present

## 2018-05-22 DIAGNOSIS — I7389 Other specified peripheral vascular diseases: Secondary | ICD-10-CM | POA: Diagnosis not present

## 2018-07-19 ENCOUNTER — Ambulatory Visit: Payer: Medicare Other | Admitting: Cardiology

## 2018-07-30 DIAGNOSIS — I10 Essential (primary) hypertension: Secondary | ICD-10-CM | POA: Diagnosis not present

## 2018-07-30 DIAGNOSIS — E1165 Type 2 diabetes mellitus with hyperglycemia: Secondary | ICD-10-CM | POA: Diagnosis not present

## 2018-07-30 DIAGNOSIS — K219 Gastro-esophageal reflux disease without esophagitis: Secondary | ICD-10-CM | POA: Diagnosis not present

## 2018-07-30 DIAGNOSIS — K529 Noninfective gastroenteritis and colitis, unspecified: Secondary | ICD-10-CM | POA: Diagnosis not present

## 2018-07-30 DIAGNOSIS — Z6841 Body Mass Index (BMI) 40.0 and over, adult: Secondary | ICD-10-CM | POA: Diagnosis not present

## 2018-07-30 DIAGNOSIS — Z299 Encounter for prophylactic measures, unspecified: Secondary | ICD-10-CM | POA: Diagnosis not present

## 2018-07-30 DIAGNOSIS — E7800 Pure hypercholesterolemia, unspecified: Secondary | ICD-10-CM | POA: Diagnosis not present

## 2018-08-02 ENCOUNTER — Telehealth: Payer: Self-pay | Admitting: *Deleted

## 2018-08-02 NOTE — Telephone Encounter (Signed)
Medications and allergies reviewed with patient  Aware to check BP, HR and weight prior to visit  No new lab work or hospitalizations since last visit  Patient verbally consented for telehealth visits with [facility] and understands that his insurance company will be billed for the encounter.

## 2018-08-07 ENCOUNTER — Encounter: Payer: Self-pay | Admitting: Cardiology

## 2018-08-07 NOTE — Progress Notes (Signed)
Virtual Visit via Telephone Note   This visit type was conducted due to national recommendations for restrictions regarding the COVID-19 Pandemic (e.g. social distancing) in an effort to limit this patient's exposure and mitigate transmission in our community.  Due to his co-morbid illnesses, this patient is at least at moderate risk for complications without adequate follow up.  This format is felt to be most appropriate for this patient at this time.  The patient did not have access to video technology/had technical difficulties with video requiring transitioning to audio format only (telephone).  All issues noted in this document were discussed and addressed.  No physical exam could be performed with this format.  Please refer to the patient's chart for his  consent to telehealth for [facility].   Evaluation Performed:  Follow-up visit  Date:  08/08/2018   ID:  Thomas [name], DOB 09-12-1955, MRN [id number]  Patient Location: Home Provider Location: Office  PCP:  Monico Blitz, MD  Cardiologist:  Rozann Lesches, MD   Chief Complaint:  Cardiac follow-up  History of Present Illness:    Thomas [name] is a 63 y.o. male presenting for a telehealth encounter through the [facility] practice.  This is my first meeting with him.  I reviewed records and updated the chart.  He was evaluated at [facility] back in December 2019 with NSTEMI, peak troponin I of 0.81.  Cardiac catheterization revealed calcified but mild disease within the major epicardials, 90% ostial to proximal second obtuse marginal stenosis and 90% mid second obtuse marginal stenosis.  He was managed medically per Dr. Claiborne Billings.  LVEF ranged from 35 to 40% at cardiac catheterization to as low as 20 to 25% by echocardiogram.  He has had no cardiology follow-up since discharge.  He did not have video access today and we spoke by phone.  He tells me that he has not experienced any chest discomfort since discharge, has stable NYHA class  II dyspnea.  Main complaint centers around chronic back pain and limitations related to that.  I reviewed his medications.  Everything is stable although he did stop Norvasc since hospital discharge, stated that it made him feel lightheaded.  He has had no follow-up assessment of LVEF since December.  I talked with him about prior diagnosis of cardiomyopathy and a follow-up echocardiogram.  He follows blood sugars with Dr. Manuella Ghazi.  Reports last hemoglobin A1c was between 7 and 8%.  The patient does not have symptoms concerning for COVID-19 infection (fever, chills, cough, or new shortness of breath).  He has been staying around the house.   Past Medical History:  Diagnosis Date  . Morbid obesity ([place])   . NSTEMI (non-ST elevated myocardial infarction) [facility])    December 2019  . Secondary cardiomyopathy ([place])   . Snoring   . Tobacco abuse   . Type 2 diabetes mellitus ([place])    Past Surgical History:  Procedure Laterality Date  . LEFT HEART CATH AND CORONARY ANGIOGRAPHY N/A 03/30/2018   Procedure: LEFT HEART CATH AND CORONARY ANGIOGRAPHY;  Surgeon: Troy Sine, MD;  Location: [place] CV LAB;  Service: Cardiovascular;  Laterality: N/A;     Current Meds  Medication Sig  . aspirin EC 81 MG tablet Take 81 mg by mouth daily.  Marland Kitchen atorvastatin (LIPITOR) 80 MG tablet Take 1 tablet (80 mg total) by mouth daily at 6 PM.  . canagliflozin (INVOKANA) 300 MG TABS tablet Take 300 mg by mouth daily before breakfast.  . clopidogrel (  PLAVIX) 75 MG tablet Take 1 tablet (75 mg total) by mouth daily with breakfast.  . isosorbide mononitrate (IMDUR) 30 MG 24 hr tablet Take 1 tablet (30 mg total) by mouth daily.  . metFORMIN (GLUCOPHAGE) 500 MG tablet Take 1,000 mg by mouth 2 (two) times daily with a meal.   . metoprolol tartrate (LOPRESSOR) 25 MG tablet Take 1 tablet (25 mg total) by mouth 2 (two) times daily.  . nitroGLYCERIN (NITROSTAT) 0.4 MG SL tablet Place 1 tablet (0.4 mg total) under the tongue  every 5 (five) minutes as needed for chest pain.  Marland Kitchen omeprazole (PRILOSEC) 40 MG capsule Take 40 mg by mouth daily.  . [DISCONTINUED] atorvastatin (LIPITOR) 80 MG tablet Take 1 tablet (80 mg total) by mouth daily at 6 PM.  . [DISCONTINUED] clopidogrel (PLAVIX) 75 MG tablet Take 1 tablet (75 mg total) by mouth daily with breakfast.  . [DISCONTINUED] isosorbide mononitrate (IMDUR) 30 MG 24 hr tablet Take 1 tablet (30 mg total) by mouth daily.  . [DISCONTINUED] metoprolol tartrate (LOPRESSOR) 25 MG tablet Take 1 tablet (25 mg total) by mouth 2 (two) times daily.     Allergies:   Codeine and Doxycycline   Social History   Tobacco Use  . Smoking status: Current Every Day Smoker    Packs/day: 1.00    Types: Cigarettes    Start date: 02/21/1965  . Smokeless tobacco: Never Used  Substance Use Topics  . Alcohol use: Not Currently  . Drug use: Never     Family Hx: The patient's family history includes Emphysema in his father; Heart failure in his father.  ROS:   Please see the history of present illness.    All other systems reviewed and are negative.   Prior CV studies:   The following studies were reviewed today:  Echocardiogram 03/31/2018: Study Conclusions  - Left ventricle: The cavity size was moderately to severely   dilated. Systolic function was severely reduced. The estimated   ejection fraction was in the range of 20% to 25%. Diffuse   hypokinesis. There is severe hypokinesis of the anteroseptal and   anterior myocardium. Indeterminate diastolic function. - Aortic valve: Mildly to moderately calcified annulus. Trileaflet.   Mean gradient (S): 5 mm Hg. Peak gradient (S): 9 mm Hg. Valve   area (Vmean): 1.63 cm^2. - Left atrium: The atrium was at the upper limits of normal in   size. - Right atrium: Central venous pressure (est): 8 mm Hg. - Tricuspid valve: There was trivial regurgitation. - Pulmonary arteries: Systolic pressure could not be accurately   estimated. -  Pericardium, extracardiac: A prominent pericardial fat pad was   present.  Cardiac catheterization 03/30/2018:  Prox Cx lesion is 30% stenosed.  Ost 2nd Mrg to 2nd Mrg lesion is 90% stenosed.  2nd Mrg lesion is 90% stenosed.  Prox LAD lesion is 30% stenosed.   Moderate global LV dysfunction with an ejection fraction of 35 to 40%.  LVEDP 13 mm.  There is evidence for coronary calcification.  The LAD has 30% proximal stenosis.  The Circumflex vessel has mild 30% narrowing prior to giving off 3 marginal vessels and the AV groove circumflex.  The second marginal branch has diffuse disease with 90% near ostial to proximal stenosis and diffuse 90% mid stenoses.  The RCA is a normal dominant vessel.  RECOMMENDATION: Due to the patient's large body habitus, visualization of vessels was difficult.  Recommend an initial aggressive medical therapy regimen with high potency statin therapy with  target LDL less than 70, beta-blocker, amlodipine, in addition to nitrate therapy.  Since cardiac enzymes are mildly positive consistent with a NSTEMI recommend initial dual antiplatelet therapy with aspirin/Plavix even though stenting was not performed.  If patient fails medical therapy consider PCI to the diffusely diseased second marginal branch of the circumflex.  Smoking cessation is imperative.  Labs/Other Tests and Data Reviewed:    EKG:  An ECG dated 03/31/2018 was personally reviewed today and demonstrated:  Sinus tachycardia with poor anterior R wave progression.  Recent Labs: 03/29/2018: TSH 1.080 03/30/2018: ALT 35; BUN 16; Creatinine, Ser 0.59; Hemoglobin 18.1; Platelets 167; Potassium 4.3; Sodium 141   Recent Lipid Panel Lab Results  Component Value Date/Time   CHOL 181 03/30/2018 05:43 AM   TRIG 322 (H) 03/30/2018 05:43 AM   HDL 26 (L) 03/30/2018 05:43 AM   CHOLHDL 7.0 03/30/2018 05:43 AM   LDLCALC 91 03/30/2018 05:43 AM    Wt Readings from Last 3 Encounters:  08/08/18 289 lb  (131.1 kg)  03/31/18 285 lb 1.6 oz (129.3 kg)     Objective:    Vital Signs:  BP (!) 152/80   Ht 5\' 9"  (1.753 m)   Wt 289 lb (131.1 kg)   BMI 42.68 kg/m    Patient answered questions spontaneously on the phone.  Voice tone and speech pattern were normal.  He was not breathless while speaking in full sentences, but did have an inspiratory upper airway wheeze.  ASSESSMENT & PLAN:    1.  CAD status post NSTEMI in December 2019 as discussed above.  He had only mild disease within the major epicardial branches, but significant disease in the second obtuse marginal that was managed medically.  He does not report active angina at this time.  He remains on dual antiplatelet therapy and statin.  He did not tolerate amlodipine secondary to lightheadedness.  2.  Secondary cardiomyopathy, LVEF varying widely in December 2019 based on modality.  Plan is to obtain a follow-up echocardiogram as he may need further medication adjustment.  This will be accomplished in the [place] office.  3.  Elevated blood pressure, states that he did not tolerate amlodipine.  Otherwise reports compliance with Lopressor and Imdur.  Depending on echocardiogram results we can reevaluate therapy.  4.  Type 2 diabetes mellitus, followed by Dr. Manuella Ghazi.  5.  Mixed hyperlipidemia, on statin therapy.  Last LDL was 91.  This can be reassessed going forward.  COVID-19 Education: The signs and symptoms of COVID-19 were discussed with the patient and how to seek care for testing (follow up with PCP or arrange E-visit).  The importance of social distancing was discussed today.  Time:   Today, I have spent 8 minutes with the patient with telehealth technology discussing the above problems.     Medication Adjustments/Labs and Tests Ordered: Current medicines are reviewed at length with the patient today.  Concerns regarding medicines are outlined above.   Tests Ordered: Orders Placed This Encounter  Procedures  . ECHOCARDIOGRAM  COMPLETE    Medication Changes: Meds ordered this encounter  Medications  . metoprolol tartrate (LOPRESSOR) 25 MG tablet    Sig: Take 1 tablet (25 mg total) by mouth 2 (two) times daily.    Dispense:  180 tablet    Refill:  1  . isosorbide mononitrate (IMDUR) 30 MG 24 hr tablet    Sig: Take 1 tablet (30 mg total) by mouth daily.    Dispense:  90 tablet  Refill:  1  . clopidogrel (PLAVIX) 75 MG tablet    Sig: Take 1 tablet (75 mg total) by mouth daily with breakfast.    Dispense:  90 tablet    Refill:  3  . atorvastatin (LIPITOR) 80 MG tablet    Sig: Take 1 tablet (80 mg total) by mouth daily at 6 PM.    Dispense:  90 tablet    Refill:  1    Disposition:  Follow up 3 months in [place] office.  Signed, Rozann Lesches, MD  08/08/2018 3:10 PM    [place]

## 2018-08-08 ENCOUNTER — Encounter: Payer: Self-pay | Admitting: Cardiology

## 2018-08-08 ENCOUNTER — Telehealth (INDEPENDENT_AMBULATORY_CARE_PROVIDER_SITE_OTHER): Payer: Medicare HMO | Admitting: Cardiology

## 2018-08-08 VITALS — BP 152/80 | Ht 69.0 in | Wt 289.0 lb

## 2018-08-08 DIAGNOSIS — I429 Cardiomyopathy, unspecified: Secondary | ICD-10-CM

## 2018-08-08 DIAGNOSIS — I25119 Atherosclerotic heart disease of native coronary artery with unspecified angina pectoris: Secondary | ICD-10-CM

## 2018-08-08 DIAGNOSIS — E1165 Type 2 diabetes mellitus with hyperglycemia: Secondary | ICD-10-CM | POA: Diagnosis not present

## 2018-08-08 DIAGNOSIS — Z7189 Other specified counseling: Secondary | ICD-10-CM

## 2018-08-08 DIAGNOSIS — R030 Elevated blood-pressure reading, without diagnosis of hypertension: Secondary | ICD-10-CM | POA: Diagnosis not present

## 2018-08-08 MED ORDER — ATORVASTATIN CALCIUM 80 MG PO TABS: 80.0000 mg | ORAL_TABLET | Freq: Every day | ORAL | 1 refills | Status: DC

## 2018-08-08 MED ORDER — ISOSORBIDE MONONITRATE ER 30 MG PO TB24: 30.0000 mg | ORAL_TABLET | Freq: Every day | ORAL | 1 refills | Status: DC

## 2018-08-08 MED ORDER — METOPROLOL TARTRATE 25 MG PO TABS: 25.0000 mg | ORAL_TABLET | Freq: Two times a day (BID) | ORAL | 1 refills | Status: DC

## 2018-08-08 MED ORDER — CLOPIDOGREL BISULFATE 75 MG PO TABS: 75.0000 mg | ORAL_TABLET | Freq: Every day | ORAL | 3 refills | Status: DC

## 2018-08-08 NOTE — Patient Instructions (Addendum)
Medication Instructions:   Your physician recommends that you continue on your current medications as directed. Please refer to the Current Medication list given to you today.  Labwork:  NONE  Testing/Procedures: Your physician has requested that you have an echocardiogram. Echocardiography is a painless test that uses sound waves to create images of your heart. It provides your doctor with information about the size and shape of your heart and how well your heart's chambers and valves are working. This procedure takes approximately one hour. There are no restrictions for this procedure.  Follow-Up:  Your physician recommends that you schedule a follow-up appointment in: 3 months in the office with Dr. Domenic Polite.  Any Other Special Instructions Will Be Listed Below (If Applicable).  If you need a refill on your cardiac medications before your next appointment, please call your pharmacy.

## 2018-08-09 DIAGNOSIS — E1165 Type 2 diabetes mellitus with hyperglycemia: Secondary | ICD-10-CM | POA: Diagnosis not present

## 2018-08-09 DIAGNOSIS — Z299 Encounter for prophylactic measures, unspecified: Secondary | ICD-10-CM | POA: Diagnosis not present

## 2018-08-09 DIAGNOSIS — Z6841 Body Mass Index (BMI) 40.0 and over, adult: Secondary | ICD-10-CM | POA: Diagnosis not present

## 2018-08-09 DIAGNOSIS — F411 Generalized anxiety disorder: Secondary | ICD-10-CM | POA: Diagnosis not present

## 2018-08-09 DIAGNOSIS — I10 Essential (primary) hypertension: Secondary | ICD-10-CM | POA: Diagnosis not present

## 2018-08-09 DIAGNOSIS — K644 Residual hemorrhoidal skin tags: Secondary | ICD-10-CM | POA: Diagnosis not present

## 2018-08-09 DIAGNOSIS — K5900 Constipation, unspecified: Secondary | ICD-10-CM | POA: Diagnosis not present

## 2018-08-09 DIAGNOSIS — F17210 Nicotine dependence, cigarettes, uncomplicated: Secondary | ICD-10-CM | POA: Diagnosis not present

## 2018-08-15 ENCOUNTER — Telehealth: Payer: Self-pay | Admitting: Cardiology

## 2018-08-15 NOTE — Telephone Encounter (Signed)
° ° ° °  COVID-19 Pre-Screening Questions:   Do you currently have a fever? NO    Have you recently travelled on a cruise, internationally, or to [place], [place], [place], [place], [place], or [place], [place] [employer]) ?NO    Have you been in contact with someone that is currently pending confirmation of Covid19 testing or has been confirmed to have the [place] virus? NO    itiate e-visit if symptoms develop) Are you currently experiencing fatigue or cough?NO

## 2018-08-16 ENCOUNTER — Other Ambulatory Visit: Payer: Self-pay

## 2018-08-16 ENCOUNTER — Ambulatory Visit (INDEPENDENT_AMBULATORY_CARE_PROVIDER_SITE_OTHER): Payer: Medicare HMO

## 2018-08-16 DIAGNOSIS — I429 Cardiomyopathy, unspecified: Secondary | ICD-10-CM

## 2018-08-16 DIAGNOSIS — I25119 Atherosclerotic heart disease of native coronary artery with unspecified angina pectoris: Secondary | ICD-10-CM | POA: Diagnosis not present

## 2018-08-20 ENCOUNTER — Telehealth: Payer: Self-pay | Admitting: *Deleted

## 2018-08-20 DIAGNOSIS — R030 Elevated blood-pressure reading, without diagnosis of hypertension: Secondary | ICD-10-CM

## 2018-08-20 DIAGNOSIS — I429 Cardiomyopathy, unspecified: Secondary | ICD-10-CM

## 2018-08-20 NOTE — Telephone Encounter (Signed)
LM to return call.

## 2018-08-20 NOTE — Telephone Encounter (Signed)
-----   Message from Satira Sark, MD sent at 08/17/2018 10:55 AM EDT ----- Results reviewed.  Follow-up echocardiogram reports continued LV dysfunction, estimated LVEF 35 to 40% range.  Based on this would recommend medication adjustments.  Stop Lopressor and initiate Coreg 6.25 mg twice daily.  Also start Entresto 24/26 mg twice daily.  He will need a follow-up BMET in 2 weeks.  Schedule a telehealth encounter in 1 month.  He already has an office visit scheduled in late July.

## 2018-08-22 MED ORDER — CARVEDILOL 6.25 MG PO TABS: 6.2500 mg | ORAL_TABLET | Freq: Two times a day (BID) | ORAL | 3 refills | Status: DC

## 2018-08-22 MED ORDER — SACUBITRIL-VALSARTAN 24-26 MG PO TABS: 1.0000 | ORAL_TABLET | Freq: Two times a day (BID) | ORAL | 6 refills | Status: DC

## 2018-08-22 NOTE — Telephone Encounter (Signed)
Follow up  ° ° °Patient is returning call.  °

## 2018-08-22 NOTE — Telephone Encounter (Signed)
-----   Message from Satira Sark, MD sent at 08/17/2018 10:55 AM EDT ----- Results reviewed.  Follow-up echocardiogram reports continued LV dysfunction, estimated LVEF 35 to 40% range.  Based on this would recommend medication adjustments.  Stop Lopressor and initiate Coreg 6.25 mg twice daily.  Also start Entresto 24/26 mg twice daily.  He will need a follow-up BMET in 2 weeks.  Schedule a telehealth encounter in 1 month.  He already has an office visit scheduled in late July.

## 2018-08-22 NOTE — Telephone Encounter (Signed)
Patient informed and verbalized understanding of plan. Will come to the office to pick up voucher for entresto and lab order. Lab work to be done at [facility]. Copy sent to PCP

## 2018-08-23 ENCOUNTER — Telehealth: Payer: Self-pay

## 2018-08-23 NOTE — Telephone Encounter (Signed)
Received notification through Cover My Meds that Delene Loll is approved

## 2018-08-28 DIAGNOSIS — Z299 Encounter for prophylactic measures, unspecified: Secondary | ICD-10-CM | POA: Diagnosis not present

## 2018-08-28 DIAGNOSIS — E7800 Pure hypercholesterolemia, unspecified: Secondary | ICD-10-CM | POA: Diagnosis not present

## 2018-08-28 DIAGNOSIS — Z6841 Body Mass Index (BMI) 40.0 and over, adult: Secondary | ICD-10-CM | POA: Diagnosis not present

## 2018-08-28 DIAGNOSIS — I10 Essential (primary) hypertension: Secondary | ICD-10-CM | POA: Diagnosis not present

## 2018-08-28 DIAGNOSIS — E1165 Type 2 diabetes mellitus with hyperglycemia: Secondary | ICD-10-CM | POA: Diagnosis not present

## 2018-08-30 DIAGNOSIS — Z6841 Body Mass Index (BMI) 40.0 and over, adult: Secondary | ICD-10-CM | POA: Diagnosis not present

## 2018-08-30 DIAGNOSIS — B372 Candidiasis of skin and nail: Secondary | ICD-10-CM | POA: Diagnosis not present

## 2018-08-30 DIAGNOSIS — I10 Essential (primary) hypertension: Secondary | ICD-10-CM | POA: Diagnosis not present

## 2018-08-30 DIAGNOSIS — E1165 Type 2 diabetes mellitus with hyperglycemia: Secondary | ICD-10-CM | POA: Diagnosis not present

## 2018-08-30 DIAGNOSIS — Z299 Encounter for prophylactic measures, unspecified: Secondary | ICD-10-CM | POA: Diagnosis not present

## 2018-09-24 ENCOUNTER — Telehealth: Payer: Self-pay | Admitting: Cardiology

## 2018-09-24 ENCOUNTER — Other Ambulatory Visit (HOSPITAL_COMMUNITY)
Admission: RE | Admit: 2018-09-24 | Discharge: 2018-09-24 | Disposition: A | Discharge status: Home / Self Care | Payer: Medicare HMO | Source: Ambulatory Visit | Attending: Cardiology | Admitting: Cardiology

## 2018-09-24 DIAGNOSIS — R69 Illness, unspecified: Secondary | ICD-10-CM | POA: Insufficient documentation

## 2018-09-24 LAB — BASIC METABOLIC PANEL
Anion gap: 11 (ref 5–15)
BUN: 19 mg/dL (ref 8–23)
CO2: 28 mmol/L (ref 22–32)
Calcium: 9.2 mg/dL (ref 8.9–10.3)
Chloride: 102 mmol/L (ref 98–111)
Creatinine, Ser: 0.81 mg/dL (ref 0.61–1.24)
GFR calc Af Amer: >60 mL/min (ref >60)
GFR calc non Af Amer: >60 mL/min (ref >60)
Glucose, Bld: 197 mg/dL — ABNORMAL HIGH (ref 70–99)
Potassium: 4.8 mmol/L (ref 3.5–5.1)
Sodium: 141 mmol/L (ref 135–145)

## 2018-09-24 NOTE — Telephone Encounter (Signed)
Patient will decrease dose of Entresto to 1/2 tablet twice a day

## 2018-09-24 NOTE — Telephone Encounter (Signed)
Pt has started swelling since taking the Entresto, his feet have been swelling so he cut it in half and they have stopped swelling

## 2018-09-24 NOTE — Telephone Encounter (Signed)
I will inform Dr.McDowell

## 2018-09-24 NOTE — Telephone Encounter (Signed)
Noted, go with the half dose for now and we can reassess at follow-up.

## 2018-09-30 NOTE — Progress Notes (Signed)
Virtual Visit via Telephone Note   This visit type was conducted due to national recommendations for restrictions regarding the COVID-19 Pandemic (e.g. social distancing) in an effort to limit this patient's exposure and mitigate transmission in our community.  Due to his co-morbid illnesses, this patient is at least at moderate risk for complications without adequate follow up.  This format is felt to be most appropriate for this patient at this time.  The patient did not have access to video technology/had technical difficulties with video requiring transitioning to audio format only (telephone).  All issues noted in this document were discussed and addressed.  No physical exam could be performed with this format.  Please refer to the patient's chart for his  consent to telehealth for [facility].   Date:  10/01/2018   ID:  Thomas [name], DOB 1955/07/09, MRN [id number]  Patient Location: Home Provider Location: Office  PCP:  Monico Blitz, MD  Cardiologist:  Rozann Lesches, MD Electrophysiologist:  None   Evaluation Performed:  Follow-Up Visit  Chief Complaint:   Cardiac follow-up  History of Present Illness:    Thomas [name] is a 63 y.o. male assessed by me for the first time per telehealth encounter in April.  We communicated by phone today in the absence of access to video conferencing.  He states that he feels better in general, NYHA class II dyspnea, no chest pain or palpitations.  He has had no syncope.  Follow-up echocardiogram in April showed LVEF 35-40% range and medications were adjusted to include Coreg and Entresto.  He states that he had leg swelling after starting Entresto, but cut the dose in half and the swelling resolved.  He is not on a diuretic at baseline.  Reports no orthopnea or PND.  His weight is actually down 9 pounds.  Recent follow-up lab work shows normal renal function and potassium.  The patient does not have symptoms concerning for COVID-19 infection  (fever, chills, cough, or new shortness of breath).    Past Medical History:  Diagnosis Date  . Morbid obesity ([place])   . NSTEMI (non-ST elevated myocardial infarction) [facility])    December 2019  . Secondary cardiomyopathy ([place])   . Snoring   . Tobacco abuse   . Type 2 diabetes mellitus ([place])    Past Surgical History:  Procedure Laterality Date  . LEFT HEART CATH AND CORONARY ANGIOGRAPHY N/A 03/30/2018   Procedure: LEFT HEART CATH AND CORONARY ANGIOGRAPHY;  Surgeon: Troy Sine, MD;  Location: [place] CV LAB;  Service: Cardiovascular;  Laterality: N/A;     Current Meds  Medication Sig  . aspirin EC 81 MG tablet Take 81 mg by mouth daily.  Marland Kitchen atorvastatin (LIPITOR) 80 MG tablet Take 1 tablet (80 mg total) by mouth daily at 6 PM.  . canagliflozin (INVOKANA) 300 MG TABS tablet Take 300 mg by mouth daily before breakfast.  . carvedilol (COREG) 6.25 MG tablet Take 1 tablet (6.25 mg total) by mouth 2 (two) times daily.  . clopidogrel (PLAVIX) 75 MG tablet Take 1 tablet (75 mg total) by mouth daily with breakfast.  . isosorbide mononitrate (IMDUR) 30 MG 24 hr tablet Take 1 tablet (30 mg total) by mouth daily.  . metFORMIN (GLUCOPHAGE) 500 MG tablet Take 1,000 mg by mouth 2 (two) times daily with a meal.   . nitroGLYCERIN (NITROSTAT) 0.4 MG SL tablet Place 1 tablet (0.4 mg total) under the tongue every 5 (five) minutes as needed for chest pain.  Marland Kitchen  omeprazole (PRILOSEC) 40 MG capsule Take 40 mg by mouth daily.  . sacubitril-valsartan (ENTRESTO) 24-26 MG Take 1 tablet by mouth 2 (two) times daily. (Patient taking differently: Take 0.5 tablets by mouth 2 (two) times daily. )     Allergies:   Codeine and Doxycycline   Social History   Tobacco Use  . Smoking status: Current Every Day Smoker    Packs/day: 1.00    Types: Cigarettes    Start date: 02/21/1965  . Smokeless tobacco: Never Used  Substance Use Topics  . Alcohol use: Not Currently  . Drug use: Never     Family Hx: The  patient's family history includes Emphysema in his father; Heart failure in his father.  ROS:   Please see the history of present illness.    Chronic back pain and arthritic pain. All other systems reviewed and are negative.   Prior CV studies:   The following studies were reviewed today:  Echocardiogram 08/16/2018:  1. The left ventricle has moderately reduced systolic function, with an ejection fraction of 35-40%. The cavity size was Poorly visualized. There is mildly increased left ventricular wall thickness. Left ventricular diastolic Doppler parameters are  consistent with impaired relaxation.  2. Anteroseptal wall is hypokinetic.  3. The right ventricle has normal systolic function. The cavity was normal. There is no increase in right ventricular wall thickness.  4. Mild thickening of the mitral valve leaflet. Mild calcification of the mitral valve leaflet. No evidence of mitral valve stenosis.  5. The aortic valve has an indeterminate number of cusps. Mild thickening of the aortic valve. Mild calcification of the aortic valve. No stenosis of the aortic valve. Mild aortic annular calcification noted.  6. The aortic root is normal in size and structure.  7. Pulmonary hypertension is indeterminate, inadequate TR jet.  8. Poor visualization, difficult assessment of LVEF due to very poorly visualized LV and endocardium. Best estimate is LVEF 35-40%. Consider limited echo with contrast to better evaluate.  Cardiac catheterization 03/30/2018:  Prox Cx lesion is 30% stenosed.  Ost 2nd Mrg to 2nd Mrg lesion is 90% stenosed.  2nd Mrg lesion is 90% stenosed.  Prox LAD lesion is 30% stenosed.  Moderate global LV dysfunction with an ejection fraction of 35 to 40%. LVEDP 13 mm.  There is evidence for coronary calcification. The LAD has 30% proximal stenosis.  The Circumflex vessel has mild 30% narrowing prior to giving off 3 marginal vessels and the AV groove circumflex. The second  marginal branch has diffuse disease with 90% near ostial to proximal stenosis and diffuse 90% mid stenoses.  The RCA is a normal dominant vessel.  RECOMMENDATION: Due to the patient's large body habitus, visualization of vessels was difficult. Recommend an initial aggressive medical therapy regimen with high potency statin therapy with target LDL less than 70, beta-blocker, amlodipine, in addition to nitrate therapy. Since cardiac enzymes are mildly positive consistent with a NSTEMI recommend initial dual antiplatelet therapy with aspirin/Plavix even though stenting was not performed. If patient fails medical therapy consider PCI to the diffusely diseased second marginal branch of the circumflex. Smoking cessation is imperative.  Labs/Other Tests and Data Reviewed:    EKG:  An ECG dated 03/31/2018 was personally reviewed today and demonstrated:  Sinus tachycardia with poor R wave progression.  Recent Labs: 03/29/2018: TSH 1.080 03/30/2018: ALT 35; Hemoglobin 18.1; Platelets 167 09/24/2018: BUN 19; Creatinine, Ser 0.81; Potassium 4.8; Sodium 141   Recent Lipid Panel Lab Results  Component Value Date/Time  CHOL 181 03/30/2018 05:43 AM   TRIG 322 (H) 03/30/2018 05:43 AM   HDL 26 (L) 03/30/2018 05:43 AM   CHOLHDL 7.0 03/30/2018 05:43 AM   LDLCALC 91 03/30/2018 05:43 AM    Wt Readings from Last 3 Encounters:  10/01/18 280 lb (127 kg)  08/08/18 289 lb (131.1 kg)  03/31/18 285 lb 1.6 oz (129.3 kg)     Objective:    Vital Signs:  BP 130/78   Ht 5\' 9"  (1.753 m)   Wt 280 lb (127 kg)   BMI 41.35 kg/m    Patient spoke in full sentences, not short of breath. No audible wheezing or coughing.  ASSESSMENT & PLAN:    1.  CAD status post medically managed NSTEMI in December 2019.  He reports no active angina on medical therapy and remains on dual antiplatelet regimen with statin.  2.  Secondary cardiomyopathy, LVEF 35 to 40% range by follow-up echocardiogram in April.  He has  subsequently been changed to Coreg and Entresto, tolerating both at this point.  We will plan a follow-up echocardiogram just prior to office visit in July.  3.  Elevated blood pressure by history, systolic in the 229N today.  No changes to current regimen.  4.  Mixed hyperlipidemia, tolerating Lipitor.  COVID-19 Education: The signs and symptoms of COVID-19 were discussed with the patient and how to seek care for testing (follow up with PCP or arrange E-visit).  The importance of social distancing was discussed today.  Time:   Today, I have spent 5 minutes with the patient with telehealth technology discussing the above problems.     Medication Adjustments/Labs and Tests Ordered: Current medicines are reviewed at length with the patient today.  Concerns regarding medicines are outlined above.   Tests Ordered: Orders Placed This Encounter  Procedures  . ECHOCARDIOGRAM COMPLETE    Medication Changes: No orders of the defined types were placed in this encounter.   Follow Up:  In Person July in the [place] office.  Signed, Rozann Lesches, MD  10/01/2018 8:48 AM    [place]

## 2018-10-01 ENCOUNTER — Encounter: Payer: Self-pay | Admitting: Cardiology

## 2018-10-01 ENCOUNTER — Telehealth: Payer: Self-pay | Admitting: Cardiology

## 2018-10-01 ENCOUNTER — Telehealth (INDEPENDENT_AMBULATORY_CARE_PROVIDER_SITE_OTHER): Payer: Medicare HMO | Admitting: Cardiology

## 2018-10-01 VITALS — BP 130/78 | Ht 69.0 in | Wt 280.0 lb

## 2018-10-01 DIAGNOSIS — I429 Cardiomyopathy, unspecified: Secondary | ICD-10-CM | POA: Diagnosis not present

## 2018-10-01 DIAGNOSIS — E782 Mixed hyperlipidemia: Secondary | ICD-10-CM | POA: Diagnosis not present

## 2018-10-01 DIAGNOSIS — E1165 Type 2 diabetes mellitus with hyperglycemia: Secondary | ICD-10-CM

## 2018-10-01 DIAGNOSIS — Z7189 Other specified counseling: Secondary | ICD-10-CM | POA: Diagnosis not present

## 2018-10-01 DIAGNOSIS — R030 Elevated blood-pressure reading, without diagnosis of hypertension: Secondary | ICD-10-CM | POA: Diagnosis not present

## 2018-10-01 DIAGNOSIS — I25119 Atherosclerotic heart disease of native coronary artery with unspecified angina pectoris: Secondary | ICD-10-CM

## 2018-10-01 NOTE — Patient Instructions (Addendum)
Medication Instructions:   Your physician recommends that you continue on your current medications as directed. Please refer to the Current Medication list given to you today.  Labwork:  NONE  Testing/Procedures: Your physician has requested that you have an echocardiogram. Echocardiography is a painless test that uses sound waves to create images of your heart. It provides your doctor with information about the size and shape of your heart and how well your heart's chambers and valves are working. This procedure takes approximately one hour. There are no restrictions for this procedure.  Follow-Up:  Your physician recommends that you schedule a follow-up appointment in: July as planned.  Any Other Special Instructions Will Be Listed Below (If Applicable).  If you need a refill on your cardiac medications before your next appointment, please call your pharmacy.

## 2018-10-01 NOTE — Telephone Encounter (Signed)
°  Precert needed for: ECHO   Location: [facility]    Date: November 08, 2018

## 2018-10-10 DIAGNOSIS — Z713 Dietary counseling and surveillance: Secondary | ICD-10-CM | POA: Diagnosis not present

## 2018-10-10 DIAGNOSIS — M62830 Muscle spasm of back: Secondary | ICD-10-CM | POA: Diagnosis not present

## 2018-10-10 DIAGNOSIS — Z6841 Body Mass Index (BMI) 40.0 and over, adult: Secondary | ICD-10-CM | POA: Diagnosis not present

## 2018-10-10 DIAGNOSIS — Z299 Encounter for prophylactic measures, unspecified: Secondary | ICD-10-CM | POA: Diagnosis not present

## 2018-10-10 DIAGNOSIS — I10 Essential (primary) hypertension: Secondary | ICD-10-CM | POA: Diagnosis not present

## 2018-10-18 ENCOUNTER — Telehealth: Payer: Self-pay | Admitting: Cardiology

## 2018-10-18 NOTE — Telephone Encounter (Signed)
Patient (walked in) stating that the [place] (ENTRESTO) 24-26 MG [[id number]] is going to cost too much money. He is wanting to know what else can be prescribed.  He would like to be called at home.

## 2018-10-18 NOTE — Telephone Encounter (Signed)
Placed on wait list for [facility] 10/18/2018 @11 :58 am. Will have patient complete patient assistance paperwork.

## 2018-10-22 MED ORDER — ENTRESTO 24-26 MG PO TABS: 0.5000 | ORAL_TABLET | Freq: Two times a day (BID) | ORAL | 0 refills | Status: DC

## 2018-10-22 NOTE — Telephone Encounter (Signed)
Samples provided. Awaiting return call by patient to inform him that samples available and that patient assistance paperwork provided to fill out and return to the office.

## 2018-10-22 NOTE — Telephone Encounter (Signed)
Patient informed and verbalized understanding

## 2018-10-24 ENCOUNTER — Telehealth: Payer: Self-pay | Admitting: *Deleted

## 2018-10-24 NOTE — Telephone Encounter (Signed)
Patient informed that proof of income is needed to complete application for entresto. Patient says he will provide office with that information.

## 2018-11-06 ENCOUNTER — Encounter: Payer: Self-pay | Admitting: *Deleted

## 2018-11-06 NOTE — Progress Notes (Addendum)
Notification received from [place] that Thomas [name] will be provided for the remainder of the year.   Patient aware and said he received entresto on Thursday last week.

## 2018-11-08 ENCOUNTER — Ambulatory Visit (INDEPENDENT_AMBULATORY_CARE_PROVIDER_SITE_OTHER): Payer: Medicare HMO

## 2018-11-08 ENCOUNTER — Encounter: Payer: Self-pay | Admitting: Cardiology

## 2018-11-08 ENCOUNTER — Ambulatory Visit (INDEPENDENT_AMBULATORY_CARE_PROVIDER_SITE_OTHER): Payer: Medicare HMO | Admitting: Cardiology

## 2018-11-08 ENCOUNTER — Other Ambulatory Visit: Payer: Self-pay

## 2018-11-08 VITALS — BP 132/64 | HR 96 | Ht 69.0 in | Wt 297.0 lb

## 2018-11-08 DIAGNOSIS — I429 Cardiomyopathy, unspecified: Secondary | ICD-10-CM

## 2018-11-08 DIAGNOSIS — E782 Mixed hyperlipidemia: Secondary | ICD-10-CM

## 2018-11-08 DIAGNOSIS — I25119 Atherosclerotic heart disease of native coronary artery with unspecified angina pectoris: Secondary | ICD-10-CM | POA: Diagnosis not present

## 2018-11-08 MED ORDER — CARVEDILOL 12.5 MG PO TABS: 12.5000 mg | ORAL_TABLET | Freq: Two times a day (BID) | ORAL | 3 refills | Status: DC

## 2018-11-08 NOTE — Patient Instructions (Signed)
Medication Instructions:   Your physician has recommended you make the following change in your medication:   Increase carvedilol to 12.5 mg by mouth twice daily. You may take (2) of your 6.25 mg tablets twice daily until they are finished.  Continue all other medications the same  Labwork:  Your physician recommends that you return for lab work in: 3 months just before your next visit to check your BMET. Your lab order has been given to you today.  Testing/Procedures:  NONE  Follow-Up:  Your physician recommends that you schedule a follow-up appointment in: 3 months.  Any Other Special Instructions Will Be Listed Below (If Applicable).  If you need a refill on your cardiac medications before your next appointment, please call your pharmacy.

## 2018-11-08 NOTE — Progress Notes (Signed)
Cardiology Office Note  Date: 11/08/2018   ID: Thomas [name], DOB 1955/11/14, MRN [id number]  PCP:  Monico Blitz, MD  Cardiologist:  Rozann Lesches, MD Electrophysiologist:  None   Chief Complaint  Patient presents with  . Cardiac follow-up    History of Present Illness: Thomas [name] is a 63 y.o. male last assessed by telehealth encounter in June.  He presents today for a follow-up visit.  Reports stable dyspnea on exertion, he has been having difficulty with the high heat and humidity.  Reports prior history of "bronchitis," not certain about chronic lung disease.  He does not report any active angina at this time, states that leg swelling has gotten better since dose of Entresto was cut back.  He had a follow-up echocardiogram obtained today which shows LVEF approximately 45%, hypokinesis of the anteroseptal wall noted, indeterminate diastolic function.  This represents an increase compared to prior assessment in April.  We discussed the results today.  We went over his medications which are listed below.  His weight has climbed since last visit, he admits that he is very sedentary, does not report frank orthopnea or PND.  Past Medical History:  Diagnosis Date  . Morbid obesity ([place])   . NSTEMI (non-ST elevated myocardial infarction) [facility])    December 2019  . Secondary cardiomyopathy ([place])   . Snoring   . Tobacco abuse   . Type 2 diabetes mellitus ([place])     Past Surgical History:  Procedure Laterality Date  . LEFT HEART CATH AND CORONARY ANGIOGRAPHY N/A 03/30/2018   Procedure: LEFT HEART CATH AND CORONARY ANGIOGRAPHY;  Surgeon: Troy Sine, MD;  Location: [place] [facility];  Service: Cardiovascular;  Laterality: N/A;    Current Outpatient Medications  Medication Sig Dispense Refill  . aspirin EC 81 MG tablet Take 81 mg by mouth daily.    Marland Kitchen atorvastatin (LIPITOR) 80 MG tablet Take 1 tablet (80 mg total) by mouth daily at 6 PM. 90 tablet 1  . canagliflozin  (INVOKANA) 300 MG TABS tablet Take 300 mg by mouth daily before breakfast.    . clopidogrel (PLAVIX) 75 MG tablet Take 1 tablet (75 mg total) by mouth daily with breakfast. 90 tablet 3  . ibuprofen (ADVIL) 200 MG tablet Take 1,200 mg by mouth every 6 (six) hours as needed.    . isosorbide mononitrate (IMDUR) 30 MG 24 hr tablet Take 1 tablet (30 mg total) by mouth daily. 90 tablet 1  . metFORMIN (GLUCOPHAGE) 500 MG tablet Take 1,000 mg by mouth 2 (two) times daily with a meal.     . nitroGLYCERIN (NITROSTAT) 0.4 MG SL tablet Place 1 tablet (0.4 mg total) under the tongue every 5 (five) minutes as needed for chest pain. 25 tablet 0  . omeprazole (PRILOSEC) 40 MG capsule Take 40 mg by mouth daily.    . sacubitril-valsartan (ENTRESTO) 24-26 MG Take 0.5 tablets by mouth 2 (two) times daily. 56 tablet 0  . carvedilol (COREG) 12.5 MG tablet Take 1 tablet (12.5 mg total) by mouth 2 (two) times daily. 180 tablet 3   No current facility-administered medications for this visit.    Allergies:  Codeine and Doxycycline   Social History: The patient  reports that he has been smoking cigarettes. He started smoking about 53 years ago. He has been smoking about 1.00 pack per day. He has never used smokeless tobacco. He reports previous alcohol use. He reports that he does not use drugs.  ROS:  Please see the history of present illness. Otherwise, complete review of systems is positive for none.  All other systems are reviewed and negative.   Physical Exam: VS:  BP 132/64   Pulse 96   Ht 5\' 9"  (1.753 m)   Wt 297 lb (134.7 kg)   SpO2 (!) 86%   BMI 43.86 kg/m , BMI Body mass index is 43.86 kg/m.  Wt Readings from Last 3 Encounters:  11/08/18 297 lb (134.7 kg)  10/01/18 280 lb (127 kg)  08/08/18 289 lb (131.1 kg)    General: Morbidly obese, no distress. HEENT: Conjunctiva and lids normal, oropharynx clear. Neck: Supple, increased girth, difficult to assess JVP, no thyromegaly. Lungs: Diminished breath  sounds without wheezing, nonlabored breathing at rest. Cardiac: Regular rate and rhythm, no S3. Abdomen: Obese, bowel sounds present, no guarding or rebound. Extremities: Trace ankle edema, distal pulses 2+. Skin: Warm and dry. Musculoskeletal: No kyphosis. Neuropsychiatric: Alert and oriented x3, affect grossly appropriate.  ECG:  An ECG dated 03/31/2018 was personally reviewed today and demonstrated:  Sinus tachycardia with poor R wave progression.  Recent Labwork: 03/29/2018: TSH 1.080 03/30/2018: ALT 35; AST 23; Hemoglobin 18.1; Platelets 167 09/24/2018: BUN 19; Creatinine, Ser 0.81; Potassium 4.8; Sodium 141     Component Value Date/Time   CHOL 181 03/30/2018 0543   TRIG 322 (H) 03/30/2018 0543   HDL 26 (L) 03/30/2018 0543   CHOLHDL 7.0 03/30/2018 0543   VLDL 64 (H) 03/30/2018 0543   LDLCALC 91 03/30/2018 0543    Other Studies Reviewed Today:  Echocardiogram 7/23//2020: 1. The left ventricle has a visually estimated ejection fraction of approximately 45%. The cavity size was mildly dilated. Indeterminate diastolic function.  2. Moderate hypokinesis of the left ventricular, mid-apical anteroseptal wall.  3. The right ventricle has normal systolic function. The cavity was normal. There is no increase in right ventricular wall thickness. Right ventricular systolic pressure could not be assessed.  4. The aortic valve is tricuspid. Mild calcification of the aortic valve. Mild aortic annular calcification noted.  5. The mitral valve is grossly normal. There is mild mitral annular calcification present.  6. The tricuspid valve is grossly normal.  7. The aorta is normal in size and structure.  Cardiac catheterization 03/30/2018:  Prox Cx lesion is 30% stenosed.  Ost 2nd Mrg to 2nd Mrg lesion is 90% stenosed.  2nd Mrg lesion is 90% stenosed.  Prox LAD lesion is 30% stenosed.  Moderate global LV dysfunction with an ejection fraction of 35 to 40%. LVEDP 13 mm.  There is  evidence for coronary calcification. The LAD has 30% proximal stenosis.  The Circumflex vessel has mild 30% narrowing prior to giving off 3 marginal vessels and the AV groove circumflex. The second marginal branch has diffuse disease with 90% near ostial to proximal stenosis and diffuse 90% mid stenoses.  The RCA is a normal dominant vessel.  RECOMMENDATION: Due to the patient's large body habitus, visualization of vessels was difficult. Recommend an initial aggressive medical therapy regimen with high potency statin therapy with target LDL less than 70, beta-blocker, amlodipine, in addition to nitrate therapy. Since cardiac enzymes are mildly positive consistent with a NSTEMI recommend initial dual antiplatelet therapy with aspirin/Plavix even though stenting was not performed. If patient fails medical therapy consider PCI to the diffusely diseased second marginal branch of the circumflex. Smoking cessation is imperative.  Assessment and Plan:  1.  CAD, predominantly branch vessel based on cardiac catheterization in December 2019 at  which point he was managed medically for NSTEMI.  He reports no active angina at this time and we will continue with aspirin, Plavix, and statin therapy.  2.  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  Follow-up LVEF now approximately 45% following medication adjustments.  Coreg will be further increased to 12.5 mg twice daily, continue Entresto.  Follow-up BMET for next visit with eye toward addition of Aldactone.  3.  Morbid obesity.  Weight loss discussed.  4.  Mixed hyperlipidemia, on Lipitor.  Medication Adjustments/Labs and Tests Ordered: Current medicines are reviewed at length with the patient today.  Concerns regarding medicines are outlined above.   Tests Ordered: Orders Placed This Encounter  Procedures  . Basic metabolic panel    Medication Changes: Meds ordered this encounter  Medications  . carvedilol (COREG)  12.5 MG tablet    Sig: Take 1 tablet (12.5 mg total) by mouth 2 (two) times daily.    Dispense:  180 tablet    Refill:  3    11/08/2018 dose increase    Disposition:  Follow up 3 months in the [place] office.  Signed, Satira Sark, MD, [facility] 11/08/2018 9:53 AM    [facility] at [place], [place], [place] [address] Phone: [phone]; Fax: [phone]

## 2018-12-04 DIAGNOSIS — I700 Atherosclerosis of aorta: Secondary | ICD-10-CM | POA: Diagnosis not present

## 2018-12-04 DIAGNOSIS — M549 Dorsalgia, unspecified: Secondary | ICD-10-CM | POA: Diagnosis not present

## 2018-12-04 DIAGNOSIS — M47816 Spondylosis without myelopathy or radiculopathy, lumbar region: Secondary | ICD-10-CM | POA: Diagnosis not present

## 2018-12-04 DIAGNOSIS — M619 Calcification and ossification of muscle, unspecified: Secondary | ICD-10-CM | POA: Diagnosis not present

## 2018-12-04 DIAGNOSIS — M8938 Hypertrophy of bone, other site: Secondary | ICD-10-CM | POA: Diagnosis not present

## 2018-12-04 DIAGNOSIS — I10 Essential (primary) hypertension: Secondary | ICD-10-CM | POA: Diagnosis not present

## 2018-12-04 DIAGNOSIS — Z299 Encounter for prophylactic measures, unspecified: Secondary | ICD-10-CM | POA: Diagnosis not present

## 2018-12-04 DIAGNOSIS — Z6841 Body Mass Index (BMI) 40.0 and over, adult: Secondary | ICD-10-CM | POA: Diagnosis not present

## 2018-12-04 DIAGNOSIS — M47817 Spondylosis without myelopathy or radiculopathy, lumbosacral region: Secondary | ICD-10-CM | POA: Diagnosis not present

## 2018-12-04 DIAGNOSIS — F17210 Nicotine dependence, cigarettes, uncomplicated: Secondary | ICD-10-CM | POA: Diagnosis not present

## 2018-12-04 DIAGNOSIS — I219 Acute myocardial infarction, unspecified: Secondary | ICD-10-CM | POA: Diagnosis not present

## 2018-12-04 DIAGNOSIS — E1165 Type 2 diabetes mellitus with hyperglycemia: Secondary | ICD-10-CM | POA: Diagnosis not present

## 2018-12-04 DIAGNOSIS — M438X6 Other specified deforming dorsopathies, lumbar region: Secondary | ICD-10-CM | POA: Diagnosis not present

## 2018-12-07 DIAGNOSIS — M549 Dorsalgia, unspecified: Secondary | ICD-10-CM | POA: Diagnosis not present

## 2018-12-07 DIAGNOSIS — Z6841 Body Mass Index (BMI) 40.0 and over, adult: Secondary | ICD-10-CM | POA: Diagnosis not present

## 2018-12-07 DIAGNOSIS — I10 Essential (primary) hypertension: Secondary | ICD-10-CM | POA: Diagnosis not present

## 2018-12-07 DIAGNOSIS — F17210 Nicotine dependence, cigarettes, uncomplicated: Secondary | ICD-10-CM | POA: Diagnosis not present

## 2018-12-07 DIAGNOSIS — I700 Atherosclerosis of aorta: Secondary | ICD-10-CM | POA: Diagnosis not present

## 2018-12-07 DIAGNOSIS — Z299 Encounter for prophylactic measures, unspecified: Secondary | ICD-10-CM | POA: Diagnosis not present

## 2018-12-10 ENCOUNTER — Telehealth: Payer: Self-pay | Admitting: Cardiology

## 2018-12-10 DIAGNOSIS — Z6841 Body Mass Index (BMI) 40.0 and over, adult: Secondary | ICD-10-CM | POA: Diagnosis not present

## 2018-12-10 DIAGNOSIS — Z299 Encounter for prophylactic measures, unspecified: Secondary | ICD-10-CM | POA: Diagnosis not present

## 2018-12-10 DIAGNOSIS — F17210 Nicotine dependence, cigarettes, uncomplicated: Secondary | ICD-10-CM | POA: Diagnosis not present

## 2018-12-10 DIAGNOSIS — I10 Essential (primary) hypertension: Secondary | ICD-10-CM | POA: Diagnosis not present

## 2018-12-10 NOTE — Telephone Encounter (Signed)
Patient is still waiting on his pharmacy to let him know the prescriptions are ready.  Did say he was only given enough for 3 days though.  Just to clarify - he should be on this daily till seen in office as opposed to just the 3 days.

## 2018-12-10 NOTE — Telephone Encounter (Signed)
Complain of SOB when he is lying down or sitting in a certain position x few weeks.  Had a phone visit today with his pmd.  Was given Lasix & Potassium for 2-3 days only.  Was told to call us by pmd.  No c/o chest pain or dizziness.  Has gained about 13 pounds in approximately 2 1/2 months.  Does have noticeable swelling in his feet.  Will be starting fluid medication today.

## 2018-12-10 NOTE — Telephone Encounter (Signed)
How much Lasix and potassium did he put him on?  I would suggest staying on Lasix 20 mg daily with KCl 10 mEq daily, continue to check weight looking for an anticipated decrease if this is fluid excess.  His last LVEF actually looked better at 45% on medications including Coreg and Entresto.  Please schedule an office visit in the next few weeks with me or [place] for reevaluation.  He will need a BMET for that visit.

## 2018-12-10 NOTE — Telephone Encounter (Signed)
Patient went to PCP. Was put on lasix and told to contact our office about it

## 2018-12-11 ENCOUNTER — Telehealth: Payer: Self-pay | Admitting: *Deleted

## 2018-12-11 NOTE — Telephone Encounter (Signed)
Patient notified.  Stated that he only started the Lasix today & is already feeling light headed today.  His BP while on the phone was 134/64 & HR was 84.  Suggested that he continue to monitor his BP & weight.  If he continues to feel like this only take 1/2 dose tomorrow & notify office with BP readings.  VV scheduled for 01/02/19 with Dr. Domenic Polite.  He prefers to not call in medications yet - wants to wait to see how he feels.

## 2018-12-11 NOTE — Telephone Encounter (Signed)
The patient verbally consented for a telehealth phone visit with [facility] and understands that his/her insurance company will be billed for the encounter.  He will have vitals & medications.

## 2018-12-11 NOTE — Telephone Encounter (Signed)
Yes, if he is gained 13 pounds and has noticeable leg edema, let's keep him on low-dose diuretic and have him weigh daily.

## 2018-12-12 ENCOUNTER — Telehealth: Payer: Self-pay | Admitting: Cardiology

## 2018-12-12 MED ORDER — POTASSIUM CHLORIDE ER 10 MEQ PO TBCR: 10.0000 meq | EXTENDED_RELEASE_TABLET | Freq: Every day | ORAL | 1 refills | Status: DC

## 2018-12-12 MED ORDER — FUROSEMIDE 20 MG PO TABS: 20.0000 mg | ORAL_TABLET | Freq: Every day | ORAL | 0 refills | Status: DC

## 2018-12-12 NOTE — Telephone Encounter (Signed)
Patient called stating that he spoke with someone yesterday in regards to his medications.  He called stating that he wants the Rx called to [place], [place].

## 2018-12-12 NOTE — Telephone Encounter (Signed)
Per 8/24 phone note lasix 20 mg daily with potassium 43meq daily - sent to pharmacy - no answer no VM

## 2018-12-13 ENCOUNTER — Telehealth: Payer: Self-pay | Admitting: Cardiology

## 2018-12-13 NOTE — Telephone Encounter (Signed)
Patient called states that he continues to have a lot of shortness of breath.

## 2018-12-13 NOTE — Telephone Encounter (Signed)
Pt says he has been on lasix 20 mg daily for 3 days and still swollen in stomach/feet/legs - hasn't been urinating like normal (can go 1/2 day without having to urinate) still has SOB when laying on his back or exertion - says he is down 3lbs in 4 days.

## 2018-12-13 NOTE — Telephone Encounter (Signed)
Pt requesting to be seen - opening at 420 tomorrow (pt canceled) appt made and pt aware

## 2018-12-14 ENCOUNTER — Emergency Department (HOSPITAL_COMMUNITY): Payer: Medicare HMO

## 2018-12-14 ENCOUNTER — Encounter: Payer: Self-pay | Admitting: Cardiology

## 2018-12-14 ENCOUNTER — Other Ambulatory Visit: Payer: Self-pay

## 2018-12-14 ENCOUNTER — Encounter (HOSPITAL_COMMUNITY): Payer: Self-pay | Admitting: *Deleted

## 2018-12-14 ENCOUNTER — Inpatient Hospital Stay (HOSPITAL_COMMUNITY)
Admission: EM | Admit: 2018-12-14 | Discharge: 2018-12-22 | DRG: 189 | Disposition: A | Discharge status: Home / Self Care | Payer: Medicare HMO | Attending: Internal Medicine | Admitting: Internal Medicine

## 2018-12-14 ENCOUNTER — Ambulatory Visit (INDEPENDENT_AMBULATORY_CARE_PROVIDER_SITE_OTHER): Payer: Medicare HMO | Admitting: Cardiology

## 2018-12-14 VITALS — BP 121/74 | HR 108 | Ht 69.0 in | Wt 303.2 lb

## 2018-12-14 DIAGNOSIS — Z7982 Long term (current) use of aspirin: Secondary | ICD-10-CM

## 2018-12-14 DIAGNOSIS — J90 Pleural effusion, not elsewhere classified: Secondary | ICD-10-CM | POA: Diagnosis not present

## 2018-12-14 DIAGNOSIS — Z20828 Contact with and (suspected) exposure to other viral communicable diseases: Secondary | ICD-10-CM | POA: Diagnosis not present

## 2018-12-14 DIAGNOSIS — Z8249 Family history of ischemic heart disease and other diseases of the circulatory system: Secondary | ICD-10-CM | POA: Diagnosis not present

## 2018-12-14 DIAGNOSIS — I25119 Atherosclerotic heart disease of native coronary artery with unspecified angina pectoris: Secondary | ICD-10-CM

## 2018-12-14 DIAGNOSIS — E782 Mixed hyperlipidemia: Secondary | ICD-10-CM | POA: Diagnosis not present

## 2018-12-14 DIAGNOSIS — F17210 Nicotine dependence, cigarettes, uncomplicated: Secondary | ICD-10-CM | POA: Diagnosis present

## 2018-12-14 DIAGNOSIS — Z720 Tobacco use: Secondary | ICD-10-CM

## 2018-12-14 DIAGNOSIS — Z825 Family history of asthma and other chronic lower respiratory diseases: Secondary | ICD-10-CM

## 2018-12-14 DIAGNOSIS — J9602 Acute respiratory failure with hypercapnia: Secondary | ICD-10-CM | POA: Diagnosis not present

## 2018-12-14 DIAGNOSIS — I2510 Atherosclerotic heart disease of native coronary artery without angina pectoris: Secondary | ICD-10-CM | POA: Diagnosis present

## 2018-12-14 DIAGNOSIS — Z79899 Other long term (current) drug therapy: Secondary | ICD-10-CM

## 2018-12-14 DIAGNOSIS — I255 Ischemic cardiomyopathy: Secondary | ICD-10-CM | POA: Diagnosis not present

## 2018-12-14 DIAGNOSIS — I429 Cardiomyopathy, unspecified: Secondary | ICD-10-CM | POA: Diagnosis not present

## 2018-12-14 DIAGNOSIS — R0600 Dyspnea, unspecified: Secondary | ICD-10-CM

## 2018-12-14 DIAGNOSIS — I509 Heart failure, unspecified: Secondary | ICD-10-CM | POA: Diagnosis not present

## 2018-12-14 DIAGNOSIS — Z6841 Body Mass Index (BMI) 40.0 and over, adult: Secondary | ICD-10-CM | POA: Diagnosis not present

## 2018-12-14 DIAGNOSIS — E785 Hyperlipidemia, unspecified: Secondary | ICD-10-CM | POA: Diagnosis present

## 2018-12-14 DIAGNOSIS — I5043 Acute on chronic combined systolic (congestive) and diastolic (congestive) heart failure: Secondary | ICD-10-CM | POA: Diagnosis not present

## 2018-12-14 DIAGNOSIS — Z5320 Procedure and treatment not carried out because of patient's decision for unspecified reasons: Secondary | ICD-10-CM | POA: Diagnosis not present

## 2018-12-14 DIAGNOSIS — Z9119 Patient's noncompliance with other medical treatment and regimen: Secondary | ICD-10-CM

## 2018-12-14 DIAGNOSIS — J189 Pneumonia, unspecified organism: Secondary | ICD-10-CM | POA: Diagnosis present

## 2018-12-14 DIAGNOSIS — R0602 Shortness of breath: Secondary | ICD-10-CM | POA: Diagnosis not present

## 2018-12-14 DIAGNOSIS — I5033 Acute on chronic diastolic (congestive) heart failure: Secondary | ICD-10-CM | POA: Diagnosis not present

## 2018-12-14 DIAGNOSIS — Z6839 Body mass index (BMI) 39.0-39.9, adult: Secondary | ICD-10-CM

## 2018-12-14 DIAGNOSIS — E119 Type 2 diabetes mellitus without complications: Secondary | ICD-10-CM | POA: Diagnosis not present

## 2018-12-14 DIAGNOSIS — J181 Lobar pneumonia, unspecified organism: Secondary | ICD-10-CM | POA: Diagnosis not present

## 2018-12-14 DIAGNOSIS — G4733 Obstructive sleep apnea (adult) (pediatric): Secondary | ICD-10-CM | POA: Diagnosis present

## 2018-12-14 DIAGNOSIS — Z7984 Long term (current) use of oral hypoglycemic drugs: Secondary | ICD-10-CM

## 2018-12-14 DIAGNOSIS — R911 Solitary pulmonary nodule: Secondary | ICD-10-CM | POA: Diagnosis not present

## 2018-12-14 DIAGNOSIS — Z7901 Long term (current) use of anticoagulants: Secondary | ICD-10-CM

## 2018-12-14 DIAGNOSIS — I5023 Acute on chronic systolic (congestive) heart failure: Secondary | ICD-10-CM | POA: Diagnosis not present

## 2018-12-14 DIAGNOSIS — I252 Old myocardial infarction: Secondary | ICD-10-CM

## 2018-12-14 DIAGNOSIS — J9601 Acute respiratory failure with hypoxia: Principal | ICD-10-CM | POA: Diagnosis present

## 2018-12-14 DIAGNOSIS — Z791 Long term (current) use of non-steroidal anti-inflammatories (NSAID): Secondary | ICD-10-CM | POA: Diagnosis not present

## 2018-12-14 DIAGNOSIS — J9691 Respiratory failure, unspecified with hypoxia: Secondary | ICD-10-CM | POA: Diagnosis not present

## 2018-12-14 LAB — COMPREHENSIVE METABOLIC PANEL
ALT: 46 U/L — ABNORMAL HIGH (ref 0–44)
AST: 26 U/L (ref 15–41)
Albumin: 3.5 g/dL (ref 3.5–5.0)
Alkaline Phosphatase: 121 U/L (ref 38–126)
Anion gap: 12 (ref 5–15)
BUN: 28 mg/dL — ABNORMAL HIGH (ref 8–23)
CO2: 31 mmol/L (ref 22–32)
Calcium: 8.6 mg/dL — ABNORMAL LOW (ref 8.9–10.3)
Chloride: 101 mmol/L (ref 98–111)
Creatinine, Ser: 0.82 mg/dL (ref 0.61–1.24)
GFR calc Af Amer: >60 mL/min (ref >60)
GFR calc non Af Amer: >60 mL/min (ref >60)
Glucose, Bld: 96 mg/dL (ref 70–99)
Potassium: 4.1 mmol/L (ref 3.5–5.1)
Sodium: 144 mmol/L (ref 135–145)
Total Bilirubin: 1 mg/dL (ref 0.3–1.2)
Total Protein: 6.4 g/dL — ABNORMAL LOW (ref 6.5–8.1)

## 2018-12-14 LAB — CBC
HCT: 59.3 % — ABNORMAL HIGH (ref 39.0–52.0)
Hemoglobin: 17.5 g/dL — ABNORMAL HIGH (ref 13.0–17.0)
MCH: 27.6 pg (ref 26.0–34.0)
MCHC: 29.5 g/dL — ABNORMAL LOW (ref 30.0–36.0)
MCV: 93.5 fL (ref 80.0–100.0)
Platelets: 178 10*3/uL (ref 150–400)
RBC: 6.34 MIL/uL — ABNORMAL HIGH (ref 4.22–5.81)
RDW: 15.9 % — ABNORMAL HIGH (ref 11.5–15.5)
WBC: 7.7 10*3/uL (ref 4.0–10.5)
nRBC: 0 % (ref 0.0–0.2)

## 2018-12-14 LAB — TROPONIN I (HIGH SENSITIVITY)
Troponin I (High Sensitivity): 9 ng/L (ref <18)
Troponin I (High Sensitivity): 10 ng/L (ref <18)

## 2018-12-14 LAB — BRAIN NATRIURETIC PEPTIDE: B Natriuretic Peptide: 78 pg/mL (ref 0.0–100.0)

## 2018-12-14 LAB — SARS CORONAVIRUS 2 BY RT PCR (HOSPITAL ORDER, PERFORMED IN ~~LOC~~ HOSPITAL LAB): SARS Coronavirus 2: NEGATIVE

## 2018-12-14 MED ORDER — POTASSIUM CHLORIDE CRYS ER 20 MEQ PO TBCR
20.0000 meq | EXTENDED_RELEASE_TABLET | Freq: Two times a day (BID) | ORAL | Status: DC
  Administered 2018-12-15 – 2018-12-21 (×12): 20 meq via ORAL
  Filled 2018-12-14 (×14): qty 1

## 2018-12-14 MED ORDER — SODIUM CHLORIDE 0.9% FLUSH
3.0000 mL | Freq: Two times a day (BID) | INTRAVENOUS | Status: DC
  Administered 2018-12-15 – 2018-12-22 (×14): 3 mL via INTRAVENOUS

## 2018-12-14 MED ORDER — INSULIN ASPART 100 UNIT/ML ~~LOC~~ SOLN
0.0000 [IU] | Freq: Three times a day (TID) | SUBCUTANEOUS | Status: DC
  Administered 2018-12-21 – 2018-12-22 (×3): 5 [IU] via SUBCUTANEOUS

## 2018-12-14 MED ORDER — ATORVASTATIN CALCIUM 80 MG PO TABS
80.0000 mg | ORAL_TABLET | Freq: Every day | ORAL | Status: DC
  Administered 2018-12-15 – 2018-12-21 (×7): 80 mg via ORAL
  Filled 2018-12-14 (×2): qty 2
  Filled 2018-12-14: qty 1
  Filled 2018-12-14 (×2): qty 2
  Filled 2018-12-14: qty 1
  Filled 2018-12-14: qty 2

## 2018-12-14 MED ORDER — ASPIRIN EC 81 MG PO TBEC
81.0000 mg | DELAYED_RELEASE_TABLET | Freq: Every day | ORAL | Status: DC
  Administered 2018-12-15 – 2018-12-22 (×8): 81 mg via ORAL
  Filled 2018-12-14 (×8): qty 1

## 2018-12-14 MED ORDER — ISOSORBIDE MONONITRATE ER 30 MG PO TB24
30.0000 mg | ORAL_TABLET | Freq: Every day | ORAL | Status: DC
  Administered 2018-12-15 – 2018-12-22 (×8): 30 mg via ORAL
  Filled 2018-12-14 (×8): qty 1

## 2018-12-14 MED ORDER — NICOTINE 14 MG/24HR TD PT24
14.0000 mg | MEDICATED_PATCH | Freq: Every day | TRANSDERMAL | Status: DC
  Filled 2018-12-14 (×5): qty 1

## 2018-12-14 MED ORDER — FUROSEMIDE 10 MG/ML IJ SOLN
40.0000 mg | INTRAMUSCULAR | Status: AC
  Administered 2018-12-14: 19:00:00 40 mg via INTRAVENOUS
  Filled 2018-12-14: qty 4

## 2018-12-14 MED ORDER — ACETAMINOPHEN 325 MG PO TABS
650.0000 mg | ORAL_TABLET | ORAL | Status: DC | PRN
  Administered 2018-12-19: 650 mg via ORAL
  Filled 2018-12-14: qty 2

## 2018-12-14 MED ORDER — INSULIN ASPART 100 UNIT/ML ~~LOC~~ SOLN
0.0000 [IU] | Freq: Every day | SUBCUTANEOUS | Status: DC
  Administered 2018-12-21: 2 [IU] via SUBCUTANEOUS

## 2018-12-14 MED ORDER — METOLAZONE 2.5 MG PO TABS
2.5000 mg | ORAL_TABLET | Freq: Two times a day (BID) | ORAL | Status: DC
  Administered 2018-12-15 – 2018-12-21 (×12): 2.5 mg via ORAL
  Filled 2018-12-14 (×13): qty 1

## 2018-12-14 MED ORDER — SODIUM CHLORIDE 0.9 % IV SOLN
250.0000 mL | INTRAVENOUS | Status: DC | PRN
  Administered 2018-12-21: 250 mL via INTRAVENOUS

## 2018-12-14 MED ORDER — SODIUM CHLORIDE 0.9% FLUSH: 3.0000 mL | INTRAVENOUS | Status: DC | PRN

## 2018-12-14 MED ORDER — ONDANSETRON HCL 4 MG/2ML IJ SOLN: 4.0000 mg | Freq: Four times a day (QID) | INTRAMUSCULAR | Status: DC | PRN

## 2018-12-14 MED ORDER — CARVEDILOL 12.5 MG PO TABS
12.5000 mg | ORAL_TABLET | Freq: Two times a day (BID) | ORAL | Status: DC
  Administered 2018-12-15 – 2018-12-22 (×9): 12.5 mg via ORAL
  Filled 2018-12-14 (×12): qty 1

## 2018-12-14 MED ORDER — ENOXAPARIN SODIUM 80 MG/0.8ML ~~LOC~~ SOLN
70.0000 mg | SUBCUTANEOUS | Status: DC
  Filled 2018-12-14: qty 0.8

## 2018-12-14 MED ORDER — FUROSEMIDE 10 MG/ML IJ SOLN
40.0000 mg | Freq: Two times a day (BID) | INTRAMUSCULAR | Status: DC
  Administered 2018-12-15: 10:00:00 40 mg via INTRAVENOUS
  Filled 2018-12-14: qty 4

## 2018-12-14 NOTE — ED Notes (Signed)
Patient out of bed sitting in chair. Asking for food, Advised we need to get our testing and results first. Xray at the bedside.

## 2018-12-14 NOTE — ED Notes (Signed)
Patient sitting in wheelchair, waiting on a bed. EKG done gave to PA. Pt is 93% on 3L, states he feel better. Got patient in bed, waiting to sit on the side. Patient has distend abdomen, states he has gain 30 lbs in 3 weeks. Complaining of back pain from old injury, pulled muscle.

## 2018-12-14 NOTE — ED Triage Notes (Signed)
Patient came by POV, sent by Dr. Duard Brady in [place] today for evaluation of SOB, fluid overload.  Patient oxygen saturation 80% RA upon arrival to triage, edematous feet, and 30lbs weight gain in the last few weeks.  Patient has taken oral 20mg  Lasix today along with 10 meq potassium.

## 2018-12-14 NOTE — ED Notes (Signed)
Pt continues to ask for food, Ask PA, she will go back to the bedside with lab results and talk with patient, Patient was updated. Vital stable

## 2018-12-14 NOTE — Progress Notes (Signed)
Cardiology Office Note  Date: 12/14/2018   ID: Thomas [name], DOB 06/01/1955, MRN [id number]  PCP:  Monico Blitz, MD  Cardiologist:  Rozann Lesches, MD Electrophysiologist:  None   Chief Complaint  Patient presents with  . Cardiac follow-up    History of Present Illness: Thomas [name] is a 63 y.o. male last seen in late July.  He called to schedule a follow-up visit reporting increasing shortness of breath.  He has had progressive weight gain despite use of outpatient diuretics and tells me that he feels like his medications are not agreeing with him.  He seems very frustrated, states that "this has never happened to me before," and seems to blame a lot of it on his medications.  After walking into the office today his initial oxygen saturation was in the low 80s, this improved with rest and oxygen supplementation, ultimately into the low and mid 90s off oxygen supplementation.  He has obvious evidence of fluid overload on examination.  Recent echocardiogram demonstrated LVEF 45%, normal right ventricular function.  Cardiac catheterization from December 2019 revealed branch vessel disease involving the second obtuse marginal, otherwise no obstruction in the major epicardials.  He does not report any recent chest pain or nitroglycerin use.  I talked with him about the situation and recommended that he be hospitalized, offered to call EMS however he refused and stated that he would have his wife take him to the [facility], ER.  He states that he "does not like hospitals" and is very worried about finances.  Past Medical History:  Diagnosis Date  . Morbid obesity ([place])   . NSTEMI (non-ST elevated myocardial infarction) [facility])    December 2019  . Secondary cardiomyopathy ([place])   . Snoring   . Tobacco abuse   . Type 2 diabetes mellitus ([place])     Past Surgical History:  Procedure Laterality Date  . LEFT HEART CATH AND CORONARY ANGIOGRAPHY N/A 03/30/2018   Procedure: LEFT  HEART CATH AND CORONARY ANGIOGRAPHY;  Surgeon: Troy Sine, MD;  Location: [place] [facility];  Service: Cardiovascular;  Laterality: N/A;    Current Outpatient Medications  Medication Sig Dispense Refill  . aspirin EC 81 MG tablet Take 81 mg by mouth daily.    Marland Kitchen atorvastatin (LIPITOR) 80 MG tablet Take 1 tablet (80 mg total) by mouth daily at 6 PM. 90 tablet 1  . canagliflozin (INVOKANA) 300 MG TABS tablet Take 300 mg by mouth daily before breakfast.    . carvedilol (COREG) 12.5 MG tablet Take 1 tablet (12.5 mg total) by mouth 2 (two) times daily. 180 tablet 3  . clopidogrel (PLAVIX) 75 MG tablet Take 1 tablet (75 mg total) by mouth daily with breakfast. 90 tablet 3  . furosemide (LASIX) 20 MG tablet Take 1 tablet (20 mg total) by mouth daily. 90 tablet 0  . ibuprofen (ADVIL) 200 MG tablet Take 1,200 mg by mouth every 6 (six) hours as needed.    . isosorbide mononitrate (IMDUR) 30 MG 24 hr tablet Take 1 tablet (30 mg total) by mouth daily. 90 tablet 1  . metFORMIN (GLUCOPHAGE) 500 MG tablet Take 1,000 mg by mouth 2 (two) times daily with a meal.     . nitroGLYCERIN (NITROSTAT) 0.4 MG SL tablet Place 1 tablet (0.4 mg total) under the tongue every 5 (five) minutes as needed for chest pain. 25 tablet 0  . omeprazole (PRILOSEC) 40 MG capsule Take 40 mg by mouth daily.    Marland Kitchen  potassium chloride (K-DUR) 10 MEQ tablet Take 1 tablet (10 mEq total) by mouth daily. 90 tablet 1  . sacubitril-valsartan (ENTRESTO) 24-26 MG Take 0.5 tablets by mouth 2 (two) times daily. 56 tablet 0   No current facility-administered medications for this visit.    Allergies:  Codeine and Doxycycline   Social History: The patient  reports that he has been smoking cigarettes. He started smoking about 53 years ago. He has been smoking about 1.00 pack per day. He has never used smokeless tobacco. He reports previous alcohol use. He reports that he does not use drugs.   Family History: The patient's family history includes  Emphysema in his father; Heart failure in his father.   ROS:  Please see the history of present illness. Otherwise, complete review of systems is positive for fatigue.  All other systems are reviewed and negative.   Physical Exam: VS:  BP 121/74   Pulse (!) 108   Ht 5\' 9"  (1.753 m)   Wt (!) 303 lb 3.2 oz (137.5 kg)   SpO2 (!) 81%   BMI 44.77 kg/m , BMI Body mass index is 44.77 kg/m.  Wt Readings from Last 3 Encounters:  12/14/18 (!) 303 lb 3.2 oz (137.5 kg)  11/08/18 297 lb (134.7 kg)  10/01/18 280 lb (127 kg)    General: Morbidly obese male in no acute distress. HEENT: Conjunctiva and lids normal, wearing a mask. Neck: Supple, elevated JVP, no carotid bruits, no thyromegaly. Lungs: Decreased breath sounds at the bases with crackles, nonlabored breathing at rest. Cardiac: Regular rate and rhythm, no obvious gallop, no pericardial rub. Abdomen: Protuberant, nontender, bowel sounds present. Extremities: 2-3+ leg edema, also hand edema. Skin: Warm and dry. Musculoskeletal: No kyphosis. Neuropsychiatric: Alert and oriented x3, affect grossly appropriate.  ECG:  An ECG dated 03/29/2018 was personally reviewed today and demonstrated:  Sinus tachycardia with old anterior infarct pattern.  Recent Labwork: 03/29/2018: TSH 1.080 03/30/2018: ALT 35; AST 23; Hemoglobin 18.1; Platelets 167 09/24/2018: BUN 19; Creatinine, Ser 0.81; Potassium 4.8; Sodium 141     Component Value Date/Time   CHOL 181 03/30/2018 0543   TRIG 322 (H) 03/30/2018 0543   HDL 26 (L) 03/30/2018 0543   CHOLHDL 7.0 03/30/2018 0543   VLDL 64 (H) 03/30/2018 0543   LDLCALC 91 03/30/2018 0543    Other Studies Reviewed Today:  Echocardiogram 11/08/2018:  1. The left ventricle has a visually estimated ejection fraction of approximately 45%. The cavity size was mildly dilated. Indeterminate diastolic function.  2. Moderate hypokinesis of the left ventricular, mid-apical anteroseptal wall.  3. The right ventricle has  normal systolic function. The cavity was normal. There is no increase in right ventricular wall thickness. Right ventricular systolic pressure could not be assessed.  4. The aortic valve is tricuspid. Mild calcification of the aortic valve. Mild aortic annular calcification noted.  5. The mitral valve is grossly normal. There is mild mitral annular calcification present.  6. The tricuspid valve is grossly normal.  7. The aorta is normal in size and structure.  Cardiac catheterization 03/30/2018:  Prox Cx lesion is 30% stenosed.  Ost 2nd Mrg to 2nd Mrg lesion is 90% stenosed.  2nd Mrg lesion is 90% stenosed.  Prox LAD lesion is 30% stenosed.   Moderate global LV dysfunction with an ejection fraction of 35 to 40%.  LVEDP 13 mm.  There is evidence for coronary calcification.  The LAD has 30% proximal stenosis.  The Circumflex vessel has mild 30% narrowing prior  to giving off 3 marginal vessels and the AV groove circumflex.  The second marginal branch has diffuse disease with 90% near ostial to proximal stenosis and diffuse 90% mid stenoses.  The RCA is a normal dominant vessel.  Assessment and Plan:  1.  Acute on chronic combined heart failure with evidence of fluid overload and hypoxic respiratory failure.  He feels like he has done poorly with current medical regimen, difficult to get a sense about his compliance but he does state that he has been taking his diuretics at least recently.  I have recommended hospitalization for IV diuresis and further medication adjustments, he refused EMS transport and states that he will be taken to the [facility], ER by his spouse.  2.  Hypoxic respiratory failure, oxygen saturation in the low 80s after walking into the office, improved with oxygen supplementation and rest into the low to mid 90s, and maintained after discontinuation of oxygen supplementation.  3.  CAD, predominantly branch vessel with severe stenosis of the second obtuse marginal  that was managed medically as of cardiac catheterization in December 2019.  No major obstructive disease within the larger epicardials.  4.  Secondary cardiomyopathy, LVEF 45% by recent follow-up echocardiogram with normal RV function.  5.  Morbid obesity.  6.  Mixed hyperlipidemia on statin therapy.  Medication Adjustments/Labs and Tests Ordered: Current medicines are reviewed at length with the patient today.  Concerns regarding medicines are outlined above.   Tests Ordered: No orders of the defined types were placed in this encounter.   Medication Changes: No orders of the defined types were placed in this encounter.   Disposition: Hospitalization recommended, he is presenting to the [facility], ER.  Signed, Satira Sark, MD, [facility] 12/14/2018 4:14 PM    [facility] at [place], [place], [place] [address] Phone: [phone]; Fax: [phone]

## 2018-12-14 NOTE — ED Provider Notes (Signed)
Medical screening examination/treatment/procedure(s) were conducted as a shared visit with non-physician practitioner(s) and myself.  I personally evaluated the patient during the encounter.  Clinical Impression:   Final diagnoses:  Acute on chronic congestive heart failure, unspecified heart failure type [facility])      This patient has known congestive heart failure, presents to the hospital at the request of his cardiologist after being seen in the clinic today and being seen to be dyspneic hypoxic and edematous.  Patient claims approximately 30 pound weight gain in the last couple of weeks.  On exam he is mildly tachypneic, has decreased lung sounds bilaterally, he has pitting edema and minimal anasarca up to the level of the umbilicus.  Abdomen is otherwise soft and nontender, the patient's heart rate is borderline tachycardic, his oropharynx is clear, he is speaking in full sentences though he is mildly tachypneic.  Suspect fluid overload and congestive heart failure, patient will likely need to be admitted to the hospital, diuresed, labs pending, x-ray pending.   EKG Interpretation  Date/Time:  Friday December 14 2018 17:25:06 EDT Ventricular Rate:  98 PR Interval:    QRS Duration: 106 QT Interval:  349 QTC Calculation: 446 R Axis:   -165 Text Interpretation:  Sinus rhythm Anterolateral infarct, age indeterminate No old tracing to compare Confirmed by Noemi Chapel [phone]) on 12/14/2018 5:53:24 PM         Noemi Chapel, MD 12/14/18 [phone]

## 2018-12-14 NOTE — H&P (Signed)
History and Physical  Thomas [name] DGL:[id number] DOB: 10-01-1955 DOA: 12/14/2018  Referring physician: Kennith Maes, Hershal Coria, ED physician PCP: Monico Blitz, MD  Outpatient Specialists: Domenic Polite (Cardiology)  Patient Coming From: home  Chief Complaint: SOB  HPI: Thomas [name] is a 63 y.o. male with a history of coronary artery disease with NSTEMI in December 2019, ischemic cardiomyopathy with systolic heart failure with EF of 45% on last echocardiogram 11/08/2018, diabetes, tobacco abuse.  Patient sent to the ED from cardiology office due to increasing shortness of breath and worsening edema over the past few weeks.  The swelling has progressed from his legs to his lower abdomen.  His symptoms are worsening.  He has been taking Lasix 20 mg daily over the past 4 days, but this has not helped.  He has had a 30 pound weight gain over the past few weeks.  He does have a chronic cough that is unchanged.  No other palliating or provoking factors.  Emergency Department Course: BNP normal.  Chest x-ray shows mild failure.  Patient received 40 mg of IV Lasix with good urine output  Review of Systems:   Pt denies any fevers, chills, nausea, vomiting, diarrhea, constipation, abdominal pain, orthopnea, wheezing, palpitations, headache, vision changes, lightheadedness, dizziness, melena, rectal bleeding.  Review of systems are otherwise negative  Past Medical History:  Diagnosis Date  . Morbid obesity ([place])   . NSTEMI (non-ST elevated myocardial infarction) [facility])    December 2019  . Secondary cardiomyopathy ([place])   . Snoring   . Tobacco abuse   . Type 2 diabetes mellitus ([place])    Past Surgical History:  Procedure Laterality Date  . LEFT HEART CATH AND CORONARY ANGIOGRAPHY N/A 03/30/2018   Procedure: LEFT HEART CATH AND CORONARY ANGIOGRAPHY;  Surgeon: Troy Sine, MD;  Location: [place] [facility];  Service: Cardiovascular;  Laterality: N/A;   Social History:  reports that he has been  smoking cigarettes. He started smoking about 53 years ago. He has been smoking about 1.00 pack per day. He has never used smokeless tobacco. He reports previous alcohol use. He reports that he does not use drugs. Patient lives at home  Allergies  Allergen Reactions  . Codeine Nausea And Vomiting  . Doxycycline     Family History  Problem Relation Age of Onset  . Emphysema Father   . Heart failure Father       Prior to Admission medications   Medication Sig Start Date End Date Taking? Authorizing Provider  aspirin EC 81 MG tablet Take 81 mg by mouth daily.    [provider]  atorvastatin (LIPITOR) 80 MG tablet Take 1 tablet (80 mg total) by mouth daily at 6 PM. 08/08/18   Satira Sark, MD  canagliflozin (INVOKANA) 300 MG TABS tablet Take 300 mg by mouth daily before breakfast.    [provider]  carvedilol (COREG) 12.5 MG tablet Take 1 tablet (12.5 mg total) by mouth 2 (two) times daily. 11/08/18 02/06/19  Satira Sark, MD  clopidogrel (PLAVIX) 75 MG tablet Take 1 tablet (75 mg total) by mouth daily with breakfast. 08/08/18   Satira Sark, MD  furosemide (LASIX) 20 MG tablet Take 1 tablet (20 mg total) by mouth daily. 12/12/18 03/12/19  Satira Sark, MD  ibuprofen (ADVIL) 200 MG tablet Take 1,200 mg by mouth every 6 (six) hours as needed.    [provider]  isosorbide mononitrate (IMDUR) 30 MG 24 hr tablet Take 1 tablet (  30 mg total) by mouth daily. 08/08/18   Satira Sark, MD  metFORMIN (GLUCOPHAGE) 500 MG tablet Take 1,000 mg by mouth 2 (two) times daily with a meal.     [provider]  nitroGLYCERIN (NITROSTAT) 0.4 MG SL tablet Place 1 tablet (0.4 mg total) under the tongue every 5 (five) minutes as needed for chest pain. 03/31/18   Tommie Raymond, NP  omeprazole (PRILOSEC) 40 MG capsule Take 40 mg by mouth daily.    [provider]  potassium chloride (K-DUR) 10 MEQ tablet Take 1 tablet (10 mEq total) by mouth  daily. 12/12/18 03/12/19  Satira Sark, MD  sacubitril-valsartan (ENTRESTO) 24-26 MG Take 0.5 tablets by mouth 2 (two) times daily. 10/22/18   Satira Sark, MD    Physical Exam: BP 138/67   Pulse 93   Temp 98.2 F (36.8 C)   Resp (!) 24   Ht 5\' 9"  (1.753 m)   Wt (!) 137.4 kg   SpO2 94%   BMI 44.75 kg/m   . General: Older male. Awake and alert and oriented x3. No acute cardiopulmonary distress.  Marland Kitchen HEENT: Normocephalic atraumatic.  Right and left ears normal in appearance.  Pupils equal, round, reactive to light. Extraocular muscles are intact. Sclerae anicteric and noninjected.  Moist mucosal membranes. No mucosal lesions.  . Neck: Neck supple without lymphadenopathy. No carotid bruits. No masses palpated.  . Cardiovascular: Regular rate with normal S1-S2 sounds. No murmurs, rubs, gallops auscultated.  Increased JVD.  2+ pitting edema in legs to thighs. . Respiratory: Rales in bases bilaterally.  No accessory muscle use. . Abdomen: Soft, nontender, nondistended. Active bowel sounds. No masses or hepatosplenomegaly  . Skin: No rashes, lesions, or ulcerations.  Dry, warm to touch. 2+ dorsalis pedis and radial pulses. . Musculoskeletal: No calf or leg pain. All major joints not erythematous nontender.  No upper or lower joint deformation.  Good ROM.  No contractures  . Psychiatric: Intact judgment and insight. Pleasant and cooperative. . Neurologic: No focal neurological deficits. Strength is 5/5 and symmetric in upper and lower extremities.  Cranial nerves II through XII are grossly intact.           Labs on Admission: I have personally reviewed following labs and imaging studies  CBC: Recent Labs  Lab 12/14/18 1736  WBC 7.7  HGB 17.5*  HCT 59.3*  MCV 93.5  PLT 629   Basic Metabolic Panel: Recent Labs  Lab 12/14/18 1736  NA 144  K 4.1  CL 101  CO2 31  GLUCOSE 96  BUN 28*  CREATININE 0.82  CALCIUM 8.6*   GFR: Estimated Creatinine Clearance: 130.3 mL/min  (by C-G formula based on SCr of 0.82 mg/dL). Liver Function Tests: Recent Labs  Lab 12/14/18 1736  AST 26  ALT 46*  ALKPHOS 121  BILITOT 1.0  PROT 6.4*  ALBUMIN 3.5   No results for input(s): LIPASE, AMYLASE in the last 168 hours. No results for input(s): AMMONIA in the last 168 hours. Coagulation Profile: No results for input(s): INR, PROTIME in the last 168 hours. Cardiac Enzymes: No results for input(s): CKTOTAL, CKMB, CKMBINDEX, TROPONINI in the last 168 hours. BNP (last 3 results) No results for input(s): PROBNP in the last 8760 hours. HbA1C: No results for input(s): HGBA1C in the last 72 hours. CBG: No results for input(s): GLUCAP in the last 168 hours. Lipid Profile: No results for input(s): CHOL, HDL, LDLCALC, TRIG, CHOLHDL, LDLDIRECT in the last 72 hours. Thyroid  Function Tests: No results for input(s): TSH, T4TOTAL, FREET4, T3FREE, THYROIDAB in the last 72 hours. Anemia Panel: No results for input(s): VITAMINB12, FOLATE, FERRITIN, TIBC, IRON, RETICCTPCT in the last 72 hours. Urine analysis: No results found for: COLORURINE, APPEARANCEUR, LABSPEC, PHURINE, GLUCOSEU, HGBUR, BILIRUBINUR, KETONESUR, PROTEINUR, UROBILINOGEN, NITRITE, LEUKOCYTESUR Sepsis Labs: @LABRCNTIP (procalcitonin:4,lacticidven:4) ) Recent Results (from the past 240 hour(s))  SARS Coronavirus 2 [facility] order, Performed in [facility] hospital lab) Nasopharyngeal Nasopharyngeal Swab     Status: None   Collection Time: 12/14/18  5:56 PM   Specimen: Nasopharyngeal Swab  Result Value Ref Range Status   SARS Coronavirus 2 NEGATIVE NEGATIVE Final    Comment: (NOTE) If result is NEGATIVE SARS-CoV-2 target nucleic acids are NOT DETECTED. The SARS-CoV-2 RNA is generally detectable in upper and lower  respiratory specimens during the acute phase of infection. The lowest  concentration of SARS-CoV-2 viral copies this assay can detect is 250  copies / mL. A negative result does not preclude SARS-CoV-2  infection  and should not be used as the sole basis for treatment or other  patient management decisions.  A negative result may occur with  improper specimen collection / handling, submission of specimen other  than nasopharyngeal swab, presence of viral mutation(s) within the  areas targeted by this assay, and inadequate number of viral copies  (<250 copies / mL). A negative result must be combined with clinical  observations, patient history, and epidemiological information. If result is POSITIVE SARS-CoV-2 target nucleic acids are DETECTED. The SARS-CoV-2 RNA is generally detectable in upper and lower  respiratory specimens dur ing the acute phase of infection.  Positive  results are indicative of active infection with SARS-CoV-2.  Clinical  correlation with patient history and other diagnostic information is  necessary to determine patient infection status.  Positive results do  not rule out bacterial infection or co-infection with other viruses. If result is PRESUMPTIVE POSTIVE SARS-CoV-2 nucleic acids MAY BE PRESENT.   A presumptive positive result was obtained on the submitted specimen  and confirmed on repeat testing.  While 2019 novel coronavirus  (SARS-CoV-2) nucleic acids may be present in the submitted sample  additional confirmatory testing may be necessary for epidemiological  and / or clinical management purposes  to differentiate between  SARS-CoV-2 and other Sarbecovirus currently known to infect humans.  If clinically indicated additional testing with an alternate test  methodology [phone]) is advised. The SARS-CoV-2 RNA is generally  detectable in upper and lower respiratory sp ecimens during the acute  phase of infection. The expected result is Negative. Fact Sheet for Patients:  StrictlyIdeas.no Fact Sheet for Healthcare Providers: BankingDealers.co.za This test is not yet approved or cleared by the [place]  FDA and has been authorized for detection and/or diagnosis of SARS-CoV-2 by FDA under an Emergency Use Authorization (EUA).  This EUA will remain in effect (meaning this test can be used) for the duration of the COVID-19 declaration under Section 564(b)(1) of the Act, 21 U.S.C. section 360bbb-3(b)(1), unless the authorization is terminated or revoked sooner. Performed at [facility], [address], [place], [place] [address]      Radiological Exams on Admission: Dg Chest Portable 1 View  Result Date: 12/14/2018 CLINICAL DATA:  Shortness of breath for the past month. 30 pound weight gain. EXAM: PORTABLE CHEST 1 VIEW COMPARISON:  Chest x-ray dated March 29, 2018. FINDINGS: The heart is borderline enlarged size. Mild pulmonary vascular congestion. Small right pleural effusion with adjacent right basilar atelectasis. No pneumothorax.  No acute osseous abnormality. IMPRESSION: 1. Small right pleural effusion with adjacent right basilar atelectasis. Electronically Signed   By: Titus Dubin M.D.   On: 12/14/2018 18:28    EKG: Independently reviewed.  Sinus rhythm.  Old anterior infarct.  Assessment/Plan: Principal Problem:   Acute respiratory failure with hypoxia and hypercapnia (HCC) Active Problems:   Morbid obesity ([place])   Ischemic cardiomyopathy   Tobacco abuse   DM (diabetes mellitus) ([place])   Acute on chronic systolic CHF (congestive heart failure) ([place])    This patient was discussed with the ED physician, including pertinent vitals, physical exam findings, labs, and imaging.  We also discussed care given by the ED provider.  1. Acute respiratory failure with hypoxia a. On O2. Admit to telemetry 2. Acute on chronic systolic heart failure Telemetry monitoring Strict I/O Daily Weights Diuresis: Lasix 40mg  IV BID, add metolazone.  Potassium: 20 mEq twice a day by mouth Will defer echo as patient had recent echo Repeat BMP tomorrow 3. Diabetes a. Hold metformin b. SSI with  lantus 4. Ischemic cardiomyopathy a.  5. Obesity a.  6. Tobacco abuse  DVT prophylaxis: Lovenox Consultants:  Code Status: Full code Family Communication: none  Disposition Plan: return home   Truett Mainland, DO

## 2018-12-14 NOTE — Patient Instructions (Addendum)
Medication Instructions:    Your physician recommends that you continue on your current medications as directed. Please refer to the Current Medication list given to you today.  Labwork:  NONE  Testing/Procedures:  NONE  Follow-Up:  Your physician recommends that you schedule a follow-up appointment in: after admission  Go to [facility] Emergency Department now  Any Other Special Instructions Will Be Listed Below (If Applicable).  If you need a refill on your cardiac medications before your next appointment, please call your pharmacy.

## 2018-12-14 NOTE — ED Provider Notes (Addendum)
[facility] EMERGENCY DEPARTMENT Provider Note   CSN: [id number] Arrival date & time: 12/14/18  1651     History   Chief Complaint Chief Complaint  Patient presents with  . Shortness of Breath    HPI Thomas [name] is a 63 y.o. male with a hx of tobacco abuse, morbid obesity, T2DM, HF (last EF 45%), CAD, & ischemic cardiomyopathy who presents to the ED from his cardiologist's office with complaints of progressively worsening edema & dyspnea for the past few weeks.  Patient states he is having progressively worsening swelling to the bilateral lower extremities extending to his abdomen.  He states that with the swelling he feels intermittently short of breath, most notably with exertion, improved with rest.  He states that he recently started taking Lasix 4 days prior, 20 mg daily, and has not missed a dose.  He states his symptoms continue to worsen on Lasix.  Notes about a 30 pound weight gain over the past few weeks.  He has his chronic cough which is productive of yellow phlegm sputum which is unchanged.  He was seen by his cardiologist Dr. Domenic Polite today, noted to be hypoxic and fluid overloaded: Sent to the emergency department for admission.  Patient denies fever, chills, chest pain, nausea, vomiting, unilateral leg pain, or syncope.     HPI  Past Medical History:  Diagnosis Date  . Morbid obesity ([place])   . NSTEMI (non-ST elevated myocardial infarction) [facility])    December 2019  . Secondary cardiomyopathy ([place])   . Snoring   . Tobacco abuse   . Type 2 diabetes mellitus [facility])     Patient Active Problem List   Diagnosis Date Noted  . Morbid obesity ([place]) 03/31/2018  . Ischemic cardiomyopathy 03/31/2018  . Tobacco abuse 03/31/2018  . DM (diabetes mellitus) ([place]) 03/31/2018  . NSTEMI (non-ST elevated myocardial infarction) ([place]) 03/29/2018    Past Surgical History:  Procedure Laterality Date  . LEFT HEART CATH AND CORONARY ANGIOGRAPHY N/A 03/30/2018   Procedure: LEFT HEART  CATH AND CORONARY ANGIOGRAPHY;  Surgeon: Troy Sine, MD;  Location: [place] [facility];  Service: Cardiovascular;  Laterality: N/A;        Home Medications    Prior to Admission medications   Medication Sig Start Date End Date Taking? Authorizing Provider  aspirin EC 81 MG tablet Take 81 mg by mouth daily.    [provider]  atorvastatin (LIPITOR) 80 MG tablet Take 1 tablet (80 mg total) by mouth daily at 6 PM. 08/08/18   Satira Sark, MD  canagliflozin (INVOKANA) 300 MG TABS tablet Take 300 mg by mouth daily before breakfast.    [provider]  carvedilol (COREG) 12.5 MG tablet Take 1 tablet (12.5 mg total) by mouth 2 (two) times daily. 11/08/18 02/06/19  Satira Sark, MD  clopidogrel (PLAVIX) 75 MG tablet Take 1 tablet (75 mg total) by mouth daily with breakfast. 08/08/18   Satira Sark, MD  furosemide (LASIX) 20 MG tablet Take 1 tablet (20 mg total) by mouth daily. 12/12/18 03/12/19  Satira Sark, MD  ibuprofen (ADVIL) 200 MG tablet Take 1,200 mg by mouth every 6 (six) hours as needed.    [provider]  isosorbide mononitrate (IMDUR) 30 MG 24 hr tablet Take 1 tablet (30 mg total) by mouth daily. 08/08/18   Satira Sark, MD  metFORMIN (GLUCOPHAGE) 500 MG tablet Take 1,000 mg by mouth 2 (two) times daily with a meal.  [provider]  nitroGLYCERIN (NITROSTAT) 0.4 MG SL tablet Place 1 tablet (0.4 mg total) under the tongue every 5 (five) minutes as needed for chest pain. 03/31/18   Tommie Raymond, NP  omeprazole (PRILOSEC) 40 MG capsule Take 40 mg by mouth daily.    [provider]  potassium chloride (K-DUR) 10 MEQ tablet Take 1 tablet (10 mEq total) by mouth daily. 12/12/18 03/12/19  Satira Sark, MD  sacubitril-valsartan (ENTRESTO) 24-26 MG Take 0.5 tablets by mouth 2 (two) times daily. 10/22/18   Satira Sark, MD    Family History Family History  Problem Relation Age of Onset  . Emphysema  Father   . Heart failure Father     Social History Social History   Tobacco Use  . Smoking status: Current Every Day Smoker    Packs/day: 1.00    Types: Cigarettes    Start date: 02/21/1965  . Smokeless tobacco: Never Used  Substance Use Topics  . Alcohol use: Not Currently  . Drug use: Never     Allergies   Codeine and Doxycycline   Review of Systems Review of Systems  Constitutional: Negative for chills, diaphoresis and fever.  Respiratory: Positive for cough (chronic unchanged) and shortness of breath.   Cardiovascular: Positive for leg swelling. Negative for chest pain.  Gastrointestinal: Positive for abdominal distention (swelling). Negative for abdominal pain, blood in stool, constipation, diarrhea, nausea and vomiting.  Neurological: Negative for syncope, weakness and numbness.  All other systems reviewed and are negative.    Physical Exam Updated Vital Signs BP 138/67   Pulse 88   Temp 98.2 F (36.8 C)   Resp (!) 24   Ht 5\' 9"  (1.753 m)   Wt (!) 137.4 kg   SpO2 94%   BMI 44.75 kg/m    Physical Exam Vitals signs and nursing note reviewed.  Constitutional:      General: He is not in acute distress.    Appearance: He is well-developed. He is not toxic-appearing.  HENT:     Head: Normocephalic and atraumatic.  Eyes:     General:        Right eye: No discharge.        Left eye: No discharge.     Conjunctiva/sclera: Conjunctivae normal.  Neck:     Musculoskeletal: Neck supple.  Cardiovascular:     Rate and Rhythm: Normal rate and regular rhythm.     Comments: 2+ symmetric DP pulses.  Pulmonary:     Breath sounds: Decreased air movement (bibasilar) present. No wheezing, rhonchi or rales.     Comments: SpO2 87% on RA @ rest- improved to 94% on 3L via [place].  Abdominal:     Tenderness: There is no abdominal tenderness. There is no guarding or rebound.     Comments: Mild edema noted to the lower abdomen.   Musculoskeletal:     Comments: Patient has 2+  pitting edema that is symmetric to the bilateral lower extremities. No calf tenderness.    Skin:    General: Skin is warm and dry.     Findings: No rash.  Neurological:     Mental Status: He is alert.     Comments: Clear speech.   Psychiatric:        Behavior: Behavior normal.    ED Treatments / Results  Labs (all labs ordered are listed, but only abnormal results are displayed) Labs Reviewed  CBC - Abnormal; Notable for the following components:  Result Value   RBC 6.34 (*)    Hemoglobin 17.5 (*)    HCT 59.3 (*)    MCHC 29.5 (*)    RDW 15.9 (*)    All other components within normal limits  COMPREHENSIVE METABOLIC PANEL - Abnormal; Notable for the following components:   BUN 28 (*)    Calcium 8.6 (*)    Total Protein 6.4 (*)    ALT 46 (*)    All other components within normal limits  SARS CORONAVIRUS 2 (HOSPITAL ORDER, [facility])  BRAIN NATRIURETIC PEPTIDE  TROPONIN I (HIGH SENSITIVITY)  TROPONIN I (HIGH SENSITIVITY)    EKG EKG Interpretation  Date/Time:  Friday December 14 2018 17:25:06 EDT Ventricular Rate:  98 PR Interval:    QRS Duration: 106 QT Interval:  349 QTC Calculation: 446 R Axis:   -165 Text Interpretation:  Sinus rhythm Anterolateral infarct, age indeterminate No old tracing to compare Confirmed by Noemi Chapel [phone]) on 12/14/2018 5:53:24 PM   Radiology Dg Chest Portable 1 View  Result Date: 12/14/2018 CLINICAL DATA:  Shortness of breath for the past month. 30 pound weight gain. EXAM: PORTABLE CHEST 1 VIEW COMPARISON:  Chest x-ray dated March 29, 2018. FINDINGS: The heart is borderline enlarged size. Mild pulmonary vascular congestion. Small right pleural effusion with adjacent right basilar atelectasis. No pneumothorax. No acute osseous abnormality. IMPRESSION: 1. Small right pleural effusion with adjacent right basilar atelectasis. Electronically Signed   By: Titus Dubin M.D.   On: 12/14/2018 18:28     Procedures Procedures (including critical care time)  Medications Ordered in ED Medications  furosemide (LASIX) injection 40 mg (40 mg Intravenous Given 12/14/18 1840)    Initial Impression / Assessment and Plan / ED Course  I have reviewed the triage vital signs and the nursing notes.  Pertinent labs & imaging results that were available during my care of the patient were reviewed by me and considered in my medical decision making (see chart for details).   Patient presents to the ED from his cardiologist office for progressively worsening dyspnea & edema. Noted to be hypoxic & edematous in clinic. On ED arrival patient nontoxic appearing, hypoxic into the 80s on RA- improved w/ application of oxygen via [place]. BP mildly elevated. Exam w/ decreased breath sounds, pitting edema to the lower extremities w/ minimal anasarca up to the level of the umbilicus. Will evaluate w/ EKG, labs, & CXR. Lasix ordered, anticipate admission for CHF exacerbation. Additional DDX: pneumonia, PE, pneumothorax, ACS.   EKG: no STEMI CXR:  Small right pleural effusion with adjacent right basilar atelectasis. CBC: no leukocytosis or anemia.  CMP: BUN mildly elevated, creatinine WNL. No significant electrolyte derangement.  Trop: 9 BNP: 78.   Patient has received lasix in the ED, has begun to have urinate, he remains on oxygen via [place].  Felt to be a CHF exacerbation, atypical that his BNP is not elevated, will consult hospitalist service for admission.   20:02: CONSULT: Discussed with hospitalist Dr. Nehemiah Settle- accepts admission.   Findings and plan of care discussed with supervising physician Dr. Sabra Heck who has evaluated patient & is in agreement.   Thomas [name] was evaluated in Emergency Department on 12/14/2018 for the symptoms described in the history of present illness. He/she was evaluated in the context of the global COVID-19 pandemic, which necessitated consideration that the patient might be at risk for  infection with the SARS-CoV-2 virus that causes COVID-19. Institutional protocols and algorithms that pertain  to the evaluation of patients at risk for COVID-19 are in a state of rapid change based on information released by regulatory bodies including the CDC and federal and state organizations. These policies and algorithms were followed during the patient's care in the ED.  Final Clinical Impressions(s) / ED Diagnoses   Final diagnoses:  Acute on chronic congestive heart failure, unspecified heart failure type [facility])    ED Discharge Orders    None       Amaryllis Dyke, PA-C 12/14/18 2020    Amaryllis Dyke, PA-C 12/14/18 2151    Noemi Chapel, MD 12/14/18 [phone]

## 2018-12-14 NOTE — Progress Notes (Addendum)
Patient refused all medications and CBG checks. Patient states" he goes to bed by pm at the latest and he takes all medication in the morning.

## 2018-12-14 NOTE — ED Notes (Signed)
Bedside commode placed at bedside. Pt states he needs to sit to pee.

## 2018-12-14 NOTE — Progress Notes (Signed)
Patient arrived to the floor room: 327. Patient was greeted by his tech. He states" if you wake me up during the night, you wont know what hit you". Patient also states " I am not taking any medication, I take all mine in the morning".  Patient States "I do not care if the bed alarm is on, I will ignore it and get up and go to the bathroom myself". Patient also states "I do not want to be here and I will not be here tomorrow". AC made aware.

## 2018-12-15 LAB — HEMOGLOBIN A1C
Hgb A1c MFr Bld: 8.2 % — ABNORMAL HIGH (ref 4.8–5.6)
Mean Plasma Glucose: 188.64 mg/dL

## 2018-12-15 LAB — GLUCOSE, CAPILLARY
Glucose-Capillary: 108 mg/dL — ABNORMAL HIGH (ref 70–99)
Glucose-Capillary: 196 mg/dL — ABNORMAL HIGH (ref 70–99)
Glucose-Capillary: 175 mg/dL — ABNORMAL HIGH (ref 70–99)

## 2018-12-15 LAB — BASIC METABOLIC PANEL
Anion gap: 12 (ref 5–15)
BUN: 29 mg/dL — ABNORMAL HIGH (ref 8–23)
CO2: 34 mmol/L — ABNORMAL HIGH (ref 22–32)
Calcium: 8.6 mg/dL — ABNORMAL LOW (ref 8.9–10.3)
Chloride: 94 mmol/L — ABNORMAL LOW (ref 98–111)
Creatinine, Ser: 0.86 mg/dL (ref 0.61–1.24)
GFR calc Af Amer: >60 mL/min (ref >60)
GFR calc non Af Amer: >60 mL/min (ref >60)
Glucose, Bld: 278 mg/dL — ABNORMAL HIGH (ref 70–99)
Potassium: 4.2 mmol/L (ref 3.5–5.1)
Sodium: 140 mmol/L (ref 135–145)

## 2018-12-15 MED ORDER — METFORMIN HCL 500 MG PO TABS
1000.0000 mg | ORAL_TABLET | Freq: Two times a day (BID) | ORAL | Status: DC
  Administered 2018-12-15 – 2018-12-20 (×10): 1000 mg via ORAL
  Filled 2018-12-15 (×12): qty 2

## 2018-12-15 MED ORDER — IBUPROFEN 400 MG PO TABS
400.0000 mg | ORAL_TABLET | Freq: Once | ORAL | Status: AC
  Administered 2018-12-15: 21:00:00 400 mg via ORAL
  Filled 2018-12-15: qty 1

## 2018-12-15 MED ORDER — ALBUTEROL SULFATE (2.5 MG/3ML) 0.083% IN NEBU
2.5000 mg | INHALATION_SOLUTION | Freq: Four times a day (QID) | RESPIRATORY_TRACT | Status: DC
  Administered 2018-12-15: 17:00:00 2.5 mg via RESPIRATORY_TRACT
  Filled 2018-12-15: qty 3

## 2018-12-15 MED ORDER — FUROSEMIDE 10 MG/ML IJ SOLN
60.0000 mg | Freq: Two times a day (BID) | INTRAMUSCULAR | Status: DC
  Administered 2018-12-15 – 2018-12-16 (×2): 60 mg via INTRAVENOUS
  Filled 2018-12-15 (×2): qty 6

## 2018-12-15 MED ORDER — CLOPIDOGREL BISULFATE 75 MG PO TABS
75.0000 mg | ORAL_TABLET | Freq: Every day | ORAL | Status: DC
  Administered 2018-12-16 – 2018-12-19 (×4): 75 mg via ORAL
  Filled 2018-12-15 (×4): qty 1

## 2018-12-15 NOTE — Progress Notes (Signed)
Dr. Rosalva Ferron in to see patient and discuss patient's refusal of certain meds and all lab work. Pt is adamant that he is not going to be stuck for any further lab work and his problems are all related to the medicines that he's taking from his doctor. Dr. Rosalva Ferron explained that the labs are necessary to check his kidney function in the setting of lasix administration and that it is unsafe to admin the lasix without knowing how his kidneys are responding to the medicine. Pt states, "I don't like to be stuck and ya'll can't make any money unless you stick me over and over." Pt states, "I don't want to be stuck" and MD stated he understood patient's adversion to needles but there is no way to adequately and safely treat him without monitoring the kidney function. Pt finally agreeable to lab sticks. Pt remains on 7 lmp [place] O2, with SaO2 of 93%.  Dr. Rosalva Ferron aware, no new orders

## 2018-12-15 NOTE — Progress Notes (Addendum)
Patient Demographics:    Thomas [name], is a 63 y.o. male, DOB - Oct 12, 1955, YJE:[id number]  Admit date - 12/14/2018   Admitting Physician Truett Mainland, DO  Outpatient Primary MD for the patient is Monico Blitz, MD  LOS - 1   Chief Complaint  Patient presents with  . Shortness of Breath        Subjective:    Thomas [name] today has no fevers, no emesis,  No chest pain, voiding okay, orthopnea, shortness of breath and dyspnea on exertion persist --Patient refusing lab work, from time to time refusing medications  --Patient tries to pick and choose which part of the medical therapy will comply with  --Extensive conversation with patient about the need to be compliant, RN at bedside -Patient's wife called and also try to encourage patient to be compliant  Assessment  & Plan :    Principal Problem:   Acute respiratory failure with hypoxia and hypercapnia (HCC) Active Problems:   Acute on chronic systolic CHF (congestive heart failure) ([place])   DM (diabetes mellitus) ([place])   Morbid obesity ([place])   Ischemic cardiomyopathy   Tobacco abuse  Brief summary 63 y.o. male with a history of coronary artery disease with NSTEMI in December 2019, ischemic cardiomyopathy with EF of 45% on last echocardiogram 11/08/2018, diabetes, tobacco abuse and morbid obesity admitted 12/14/2018 as a direct admission from cardiology office for acute on chronic combined CHF exacerbation with about 30 pound weight gain  A/p 1)HFrEF--- admitted with  Acute on chronic combined heart failure exacerbation with evidence of fluid overload and hypoxic respiratory failure.  -Increase IV Lasix to 60 mg every 12 hours, continue metolazone -Daily weight and fluid input and output monitoring -Monitor electrolytes and renal function with IV diuresis  2)Acute hypoxic Respiratory Failure-suspect due to CHF exacerbation, currently requiring  up to 7 L of oxygen via nasal cannula, anticipate that this will improve with more diuresis and bronchodilators (smoker)  3)CAD- predominantly branch vessel with severe stenosis of the second obtuse marginal that was managed medically as of cardiac catheterization in December 2019.  No major obstructive disease within the larger epicardials. -No chest pains, continue Coreg, isosorbide continue aspirin , Lipitor and Plavix  4)Morbid obesity/OSA--CPAP nightly.  5)DM2--A1c 8.2, renal function appears okay, okay to restart metformin, hold Invokana for now, use Novolog/Humalog Sliding scale insulin with Accu-Cheks/Fingersticks as ordered   6)Tobacco abuse--smoking cessation advised, continue nicotine patch  7)Social/Ethics-- --Patient refusing lab work, from time to time refusing medications, --Patient tries to pick and choose which part of the medical therapy he will comply with --Extensive conversation with patient about the need to be compliant, RN at bedside --Patient's wife called and also tried to encourage patient to be compliant -Patient is a full code  Disposition/Need for in-Hospital Stay- patient unable to be discharged at this time due to significant hypoxia and volume overload requiring 7 L of oxygen via nasal cannula and IV diuresis  Code Status : Full  Family Communication:    (patient is alert, awake and coherent) Discussed with wife   Disposition Plan  : TBD  Consults  :  cardiology  DVT Prophylaxis  :  Lovenox  - SCDs   Lab Results  Component Value Date  PLT 178 12/14/2018    Inpatient Medications  Scheduled Meds: . aspirin EC  81 mg Oral Daily  . atorvastatin  80 mg Oral q1800  . carvedilol  12.5 mg Oral BID  . enoxaparin (LOVENOX) injection  70 mg Subcutaneous Q24H  . furosemide  40 mg Intravenous BID  . insulin aspart  0-15 Units Subcutaneous TID WC  . insulin aspart  0-5 Units Subcutaneous QHS  . isosorbide mononitrate  30 mg Oral Daily  . metFORMIN   1,000 mg Oral BID WC  . metolazone  2.5 mg Oral BID  . nicotine  14 mg Transdermal Daily  . potassium chloride  20 mEq Oral BID  . sodium chloride flush  3 mL Intravenous Q12H   Continuous Infusions: . sodium chloride     PRN Meds:.sodium chloride, acetaminophen, ondansetron (ZOFRAN) IV, sodium chloride flush    Anti-infectives (From admission, onward)   None        Objective:   Vitals:   12/15/18 0935 12/15/18 1020 12/15/18 1252 12/15/18 1410  BP:   133/72   Pulse:   97   Resp: 20 20    Temp:      TempSrc:      SpO2: 93% 92% 93% 93%  Weight:      Height:        Wt Readings from Last 3 Encounters:  12/15/18 (!) 136.7 kg  12/14/18 (!) 137.5 kg  11/08/18 134.7 kg     Intake/Output Summary (Last 24 hours) at 12/15/2018 1537 Last data filed at 12/15/2018 0930 Gross per 24 hour  Intake 3 ml  Output -  Net 3 ml     Physical Exam  Gen:- Awake Alert, morbidly obese, patient with conversational dyspnea HEENT:- [place].AT, No sclera icterus Nose -[place] 7L/min Neck-Supple Neck, +ve JVD  Lungs-diminished in bases with bibasilar rales CV- S1, S2 normal, regular  Abd-  +ve B.Sounds, Abd Soft, No tenderness, increased truncal adiposity    Extremity/Skin:-2-3+ edema, pedal pulses present  Psych-affect is appropriate, oriented x3 Neuro-no new focal deficits, no tremors   Data Review:   Micro Results Recent Results (from the past 240 hour(s))  SARS Coronavirus 2 [facility] order, Performed in [facility] hospital lab) Nasopharyngeal Nasopharyngeal Swab     Status: None   Collection Time: 12/14/18  5:56 PM   Specimen: Nasopharyngeal Swab  Result Value Ref Range Status   SARS Coronavirus 2 NEGATIVE NEGATIVE Final    Comment: (NOTE) If result is NEGATIVE SARS-CoV-2 target nucleic acids are NOT DETECTED. The SARS-CoV-2 RNA is generally detectable in upper and lower  respiratory specimens during the acute phase of infection. The lowest  concentration of SARS-CoV-2 viral copies  this assay can detect is 250  copies / mL. A negative result does not preclude SARS-CoV-2 infection  and should not be used as the sole basis for treatment or other  patient management decisions.  A negative result may occur with  improper specimen collection / handling, submission of specimen other  than nasopharyngeal swab, presence of viral mutation(s) within the  areas targeted by this assay, and inadequate number of viral copies  (<250 copies / mL). A negative result must be combined with clinical  observations, patient history, and epidemiological information. If result is POSITIVE SARS-CoV-2 target nucleic acids are DETECTED. The SARS-CoV-2 RNA is generally detectable in upper and lower  respiratory specimens dur ing the acute phase of infection.  Positive  results are indicative of active infection with SARS-CoV-2.  Clinical  correlation with patient history and other diagnostic information is  necessary to determine patient infection status.  Positive results do  not rule out bacterial infection or co-infection with other viruses. If result is PRESUMPTIVE POSTIVE SARS-CoV-2 nucleic acids MAY BE PRESENT.   A presumptive positive result was obtained on the submitted specimen  and confirmed on repeat testing.  While 2019 novel coronavirus  (SARS-CoV-2) nucleic acids may be present in the submitted sample  additional confirmatory testing may be necessary for epidemiological  and / or clinical management purposes  to differentiate between  SARS-CoV-2 and other Sarbecovirus currently known to infect humans.  If clinically indicated additional testing with an alternate test  methodology [phone]) is advised. The SARS-CoV-2 RNA is generally  detectable in upper and lower respiratory sp ecimens during the acute  phase of infection. The expected result is Negative. Fact Sheet for Patients:  StrictlyIdeas.no Fact Sheet for Healthcare Providers:  BankingDealers.co.za This test is not yet approved or cleared by the [place] FDA and has been authorized for detection and/or diagnosis of SARS-CoV-2 by FDA under an Emergency Use Authorization (EUA).  This EUA will remain in effect (meaning this test can be used) for the duration of the COVID-19 declaration under Section 564(b)(1) of the Act, 21 U.S.C. section 360bbb-3(b)(1), unless the authorization is terminated or revoked sooner. Performed at [facility], [address] [place], [place] [address]     Radiology Reports Dg Chest Portable 1 View  Result Date: 12/14/2018 CLINICAL DATA:  Shortness of breath for the past month. 30 pound weight gain. EXAM: PORTABLE CHEST 1 VIEW COMPARISON:  Chest x-ray dated March 29, 2018. FINDINGS: The heart is borderline enlarged size. Mild pulmonary vascular congestion. Small right pleural effusion with adjacent right basilar atelectasis. No pneumothorax. No acute osseous abnormality. IMPRESSION: 1. Small right pleural effusion with adjacent right basilar atelectasis. Electronically Signed   By: Titus Dubin M.D.   On: 12/14/2018 18:28     CBC Recent Labs  Lab 12/14/18 1736  WBC 7.7  HGB 17.5*  HCT 59.3*  PLT 178  MCV 93.5  MCH 27.6  MCHC 29.5*  RDW 15.9*    Chemistries  Recent Labs  Lab 12/14/18 1736 12/15/18 1359  NA 144 140  K 4.1 4.2  CL 101 94*  CO2 31 34*  GLUCOSE 96 278*  BUN 28* 29*  CREATININE 0.82 0.86  CALCIUM 8.6* 8.6*  AST 26  --   ALT 46*  --   ALKPHOS 121  --   BILITOT 1.0  --    ------------------------------------------------------------------------------------------------------------------ No results for input(s): CHOL, HDL, LDLCALC, TRIG, CHOLHDL, LDLDIRECT in the last 72 hours.  Lab Results  Component Value Date   HGBA1C 8.2 (H) 12/14/2018   ------------------------------------------------------------------------------------------------------------------ No results for  input(s): TSH, T4TOTAL, T3FREE, THYROIDAB in the last 72 hours.  Invalid input(s): FREET3 ------------------------------------------------------------------------------------------------------------------ No results for input(s): VITAMINB12, FOLATE, FERRITIN, TIBC, IRON, RETICCTPCT in the last 72 hours.  Coagulation profile No results for input(s): INR, PROTIME in the last 168 hours.  No results for input(s): DDIMER in the last 72 hours.  Cardiac Enzymes No results for input(s): CKMB, TROPONINI, MYOGLOBIN in the last 168 hours.  Invalid input(s): CK ------------------------------------------------------------------------------------------------------------------    Component Value Date/Time   BNP 78.0 12/14/2018 1815     Shaarav Ripple M.D on 12/15/2018 at 3:37 PM  Go to [email or web address] - for contact info  [facility] - Office  [phone]

## 2018-12-15 NOTE — Progress Notes (Signed)
Patient agreed to wear CPAP and try the nasal mask. RT got all the equipment and placed him on a nasal mask with 7lpm bled into the machine. Pateitn was complaining that he couldn't breathe or wear anything on his face but tried to wear mask for about 3 minutes. Pt then called to have mask removed.Pt was placed back on his nasal cannula at 7 lpm. Rt informed nurse of pt. RT/nurse will continue monitor.

## 2018-12-15 NOTE — Progress Notes (Signed)
MD notified, patient B/P 81/23, Pulse 101, Resp 17, SPO2 78% on RA.

## 2018-12-15 NOTE — Progress Notes (Signed)
Patient refused labs, Patient states" I am ready to go home".

## 2018-12-15 NOTE — Progress Notes (Signed)
MD ordered ABG, patient is refusing ABG and will not let respiratory therapy even close to him. Increased SPO2 to 4L, will continue to monitor Oxygen and make sure he does not go in distress.

## 2018-12-16 ENCOUNTER — Inpatient Hospital Stay (HOSPITAL_COMMUNITY): Payer: Medicare HMO

## 2018-12-16 LAB — BASIC METABOLIC PANEL
Anion gap: 9 (ref 5–15)
BUN: 27 mg/dL — ABNORMAL HIGH (ref 8–23)
CO2: 41 mmol/L — ABNORMAL HIGH (ref 22–32)
Calcium: 8.7 mg/dL — ABNORMAL LOW (ref 8.9–10.3)
Chloride: 87 mmol/L — ABNORMAL LOW (ref 98–111)
Creatinine, Ser: 0.77 mg/dL (ref 0.61–1.24)
GFR calc Af Amer: >60 mL/min (ref >60)
GFR calc non Af Amer: >60 mL/min (ref >60)
Glucose, Bld: 130 mg/dL — ABNORMAL HIGH (ref 70–99)
Potassium: 4.5 mmol/L (ref 3.5–5.1)
Sodium: 137 mmol/L (ref 135–145)

## 2018-12-16 LAB — GLUCOSE, CAPILLARY
Glucose-Capillary: 139 mg/dL — ABNORMAL HIGH (ref 70–99)
Glucose-Capillary: 150 mg/dL — ABNORMAL HIGH (ref 70–99)
Glucose-Capillary: 214 mg/dL — ABNORMAL HIGH (ref 70–99)
Glucose-Capillary: 158 mg/dL — ABNORMAL HIGH (ref 70–99)
Glucose-Capillary: 237 mg/dL — ABNORMAL HIGH (ref 70–99)

## 2018-12-16 MED ORDER — ALBUTEROL SULFATE (2.5 MG/3ML) 0.083% IN NEBU
2.5000 mg | INHALATION_SOLUTION | Freq: Two times a day (BID) | RESPIRATORY_TRACT | Status: DC
  Administered 2018-12-16: 08:00:00 2.5 mg via RESPIRATORY_TRACT
  Filled 2018-12-16: qty 3

## 2018-12-16 MED ORDER — ALBUTEROL SULFATE (2.5 MG/3ML) 0.083% IN NEBU
2.5000 mg | INHALATION_SOLUTION | Freq: Three times a day (TID) | RESPIRATORY_TRACT | Status: DC
  Administered 2018-12-16 – 2018-12-17 (×5): 2.5 mg via RESPIRATORY_TRACT
  Filled 2018-12-16 (×5): qty 3

## 2018-12-16 MED ORDER — PANTOPRAZOLE SODIUM 40 MG PO TBEC
40.0000 mg | DELAYED_RELEASE_TABLET | Freq: Every day | ORAL | Status: DC
  Administered 2018-12-16 – 2018-12-22 (×6): 40 mg via ORAL
  Filled 2018-12-16 (×7): qty 1

## 2018-12-16 MED ORDER — ALUM & MAG HYDROXIDE-SIMETH 200-200-20 MG/5ML PO SUSP
30.0000 mL | ORAL | Status: DC | PRN
  Administered 2018-12-16 – 2018-12-17 (×5): 30 mL via ORAL
  Filled 2018-12-16 (×5): qty 30

## 2018-12-16 MED ORDER — FUROSEMIDE 10 MG/ML IJ SOLN
80.0000 mg | Freq: Two times a day (BID) | INTRAMUSCULAR | Status: DC
  Administered 2018-12-16 – 2018-12-20 (×9): 80 mg via INTRAVENOUS
  Filled 2018-12-16 (×10): qty 8

## 2018-12-16 NOTE — Progress Notes (Signed)
Patient Demographics:    Thomas [name], is a 63 y.o. male, DOB - 1956-01-05, GYF:[id number]  Admit date - 12/14/2018   Admitting Physician Truett Mainland, DO  Outpatient Primary MD for the patient is Monico Blitz, MD  LOS - 2   Chief Complaint  Patient presents with   Shortness of Breath        Subjective:    Thomas [name] today has no fevers, no emesis,  No chest pain,   --Continues to have difficulty breathing especially with minimal activity -Continues to require 6 to 7 L of oxygen via nasal cannula  -Patient refused BiPAP overnight   Assessment  & Plan :    Principal Problem:   Acute respiratory failure with hypoxia and hypercapnia (HCC) Active Problems:   Acute on chronic systolic CHF (congestive heart failure) (HCC)   DM (diabetes mellitus) ([place])   Morbid obesity ([place])   Ischemic cardiomyopathy   Tobacco abuse  Brief summary 63 y.o. male with a history of coronary artery disease with NSTEMI in December 2019, ischemic cardiomyopathy with EF of 45% on last echocardiogram 11/08/2018, diabetes, tobacco abuse and morbid obesity admitted 12/14/2018 as a direct admission from cardiology office for acute on chronic combined CHF exacerbation with about 30 pound weight gain -Chest x-ray with large right-sided pleural effusion  A/p 1)HFrEF--- admitted with  Acute on chronic combined heart failure exacerbation with evidence of fluid overload and hypoxic respiratory failure.  -Increase IV Lasix further to 80 mg every 12 hours, continue metolazone -Daily weight and fluid input and output monitoring -Monitor electrolytes and renal function with IV diuresis -Right-sided pleural effusion noted patient will have therapeutic and diagnostic thoracentesis on 12/17/2018  2)Acute hypoxic Respiratory Failure-suspect due to CHF exacerbation, currently requiring up to 7 L of oxygen via nasal cannula, anticipate  that this will improve with more diuresis and bronchodilators (smoker)  3)CAD- predominantly branch vessel with severe stenosis of the second obtuse marginal that was managed medically as of cardiac catheterization in December 2019.  No major obstructive disease within the larger epicardials. -No chest pains, continue Coreg, isosorbide continue aspirin , Lipitor and Plavix  4)Morbid obesity/OSA--patient is not compliant with Bipap/CPAP nightly.  5)DM2--A1c 8.2, renal function appears okay, c/n metformin, hold Invokana for now, use Novolog/Humalog Sliding scale insulin with Accu-Cheks/Fingersticks as ordered   6)Tobacco abuse--smoking cessation advised, continue nicotine patch  7)Rt Pleural Effusion-patient will have therapeutic and diagnostic thoracentesis on 12/17/2018  8)Social/Ethics-- --Patient refusing lab work, from time to time refusing medications, --Patient tries to pick and choose which part of the medical therapy he will comply with --Extensive conversation with patient about the need to be compliant, RN at bedside --Patient's wife called and also tried to encourage patient to be compliant -Patient is a full code  Disposition/Need for in-Hospital Stay- patient unable to be discharged at this time due to significant hypoxia and volume overload requiring 7 L of oxygen via nasal cannula and IV diuresis  Code Status : Full  Family Communication:    (patient is alert, awake and coherent) Discussed with wife  again  Disposition Plan  : TBD  Procedures- Right-sided pleural effusion noted patient will have therapeutic and diagnostic thoracentesis on 12/17/2018  Consults  :  cardiology  DVT Prophylaxis  :  Lovenox  - SCDs   Lab Results  Component Value Date   PLT 178 12/14/2018    Inpatient Medications  Scheduled Meds:  albuterol  2.5 mg Nebulization TID   aspirin EC  81 mg Oral Daily   atorvastatin  80 mg Oral q1800   carvedilol  12.5 mg Oral BID   clopidogrel   75 mg Oral Daily   enoxaparin (LOVENOX) injection  70 mg Subcutaneous Q24H   furosemide  80 mg Intravenous BID   insulin aspart  0-15 Units Subcutaneous TID WC   insulin aspart  0-5 Units Subcutaneous QHS   isosorbide mononitrate  30 mg Oral Daily   metFORMIN  1,000 mg Oral BID WC   metolazone  2.5 mg Oral BID   nicotine  14 mg Transdermal Daily   potassium chloride  20 mEq Oral BID   sodium chloride flush  3 mL Intravenous Q12H   Continuous Infusions:  sodium chloride     PRN Meds:.sodium chloride, acetaminophen, alum & mag hydroxide-simeth, ondansetron (ZOFRAN) IV, sodium chloride flush    Anti-infectives (From admission, onward)   None        Objective:   Vitals:   12/15/18 2046 12/16/18 0508 12/16/18 0741 12/16/18 1127  BP: 103/90 (!) 118/51  114/66  Pulse: 73 88  87  Resp: (!) 22 20    Temp: 97.9 F (36.6 C) 98.3 F (36.8 C)    TempSrc: Axillary Oral    SpO2: 98% 93% 93% 96%  Weight:  131.8 kg    Height:        Wt Readings from Last 3 Encounters:  12/16/18 131.8 kg  12/14/18 (!) 137.5 kg  11/08/18 134.7 kg     Intake/Output Summary (Last 24 hours) at 12/16/2018 1249 Last data filed at 12/16/2018 0940 Gross per 24 hour  Intake 1443 ml  Output 4400 ml  Net -2957 ml     Physical Exam  Gen:- Awake Alert, morbidly obese, patient with conversational dyspnea HEENT:- [place].AT, No sclera icterus Nose -[place] 7L/min Neck-Supple Neck, +ve JVD  Lungs-diminished in bases, Rt > Lt with bibasilar rales  CV- S1, S2 normal, regular  Abd-  +ve B.Sounds, Abd Soft, No tenderness, increased truncal adiposity    Extremity/Skin:-2-3+ edema, pedal pulses present  Psych-affect is appropriate, oriented x3 Neuro-generalized weakness, no new focal deficits, no tremors   Data Review:   Micro Results Recent Results (from the past 240 hour(s))  SARS Coronavirus 2 [facility] order, Performed in [facility] hospital lab) Nasopharyngeal Nasopharyngeal Swab     Status:  None   Collection Time: 12/14/18  5:56 PM   Specimen: Nasopharyngeal Swab  Result Value Ref Range Status   SARS Coronavirus 2 NEGATIVE NEGATIVE Final    Comment: (NOTE) If result is NEGATIVE SARS-CoV-2 target nucleic acids are NOT DETECTED. The SARS-CoV-2 RNA is generally detectable in upper and lower  respiratory specimens during the acute phase of infection. The lowest  concentration of SARS-CoV-2 viral copies this assay can detect is 250  copies / mL. A negative result does not preclude SARS-CoV-2 infection  and should not be used as the sole basis for treatment or other  patient management decisions.  A negative result may occur with  improper specimen collection / handling, submission of specimen other  than nasopharyngeal swab, presence of viral mutation(s) within the  areas targeted by this assay, and inadequate number of viral copies  (<250 copies / mL). A negative result must be  combined with clinical  observations, patient history, and epidemiological information. If result is POSITIVE SARS-CoV-2 target nucleic acids are DETECTED. The SARS-CoV-2 RNA is generally detectable in upper and lower  respiratory specimens dur ing the acute phase of infection.  Positive  results are indicative of active infection with SARS-CoV-2.  Clinical  correlation with patient history and other diagnostic information is  necessary to determine patient infection status.  Positive results do  not rule out bacterial infection or co-infection with other viruses. If result is PRESUMPTIVE POSTIVE SARS-CoV-2 nucleic acids MAY BE PRESENT.   A presumptive positive result was obtained on the submitted specimen  and confirmed on repeat testing.  While 2019 novel coronavirus  (SARS-CoV-2) nucleic acids may be present in the submitted sample  additional confirmatory testing may be necessary for epidemiological  and / or clinical management purposes  to differentiate between  SARS-CoV-2 and other  Sarbecovirus currently known to infect humans.  If clinically indicated additional testing with an alternate test  methodology [phone]) is advised. The SARS-CoV-2 RNA is generally  detectable in upper and lower respiratory sp ecimens during the acute  phase of infection. The expected result is Negative. Fact Sheet for Patients:  StrictlyIdeas.no Fact Sheet for Healthcare Providers: BankingDealers.co.za This test is not yet approved or cleared by the [place] FDA and has been authorized for detection and/or diagnosis of SARS-CoV-2 by FDA under an Emergency Use Authorization (EUA).  This EUA will remain in effect (meaning this test can be used) for the duration of the COVID-19 declaration under Section 564(b)(1) of the Act, 21 U.S.C. section 360bbb-3(b)(1), unless the authorization is terminated or revoked sooner. Performed at [facility], [address] [place], [place] [address]     Radiology Reports Dg Chest 2 View  Result Date: 12/16/2018 CLINICAL DATA:  Shortness of breath EXAM: CHEST - 2 VIEW COMPARISON:  12/14/2018 FINDINGS: Cardiomegaly. Right effusion with atelectasis of the right lower lung. Patchy atelectasis in the left lower lung. Upper lungs are clear. IMPRESSION: Large right effusion with right lower lung volume loss. Patchy atelectasis at the left base. Electronically Signed   By: Nelson Chimes M.D.   On: 12/16/2018 11:41   Dg Chest Portable 1 View  Result Date: 12/14/2018 CLINICAL DATA:  Shortness of breath for the past month. 30 pound weight gain. EXAM: PORTABLE CHEST 1 VIEW COMPARISON:  Chest x-ray dated March 29, 2018. FINDINGS: The heart is borderline enlarged size. Mild pulmonary vascular congestion. Small right pleural effusion with adjacent right basilar atelectasis. No pneumothorax. No acute osseous abnormality. IMPRESSION: 1. Small right pleural effusion with adjacent right basilar atelectasis. Electronically  Signed   By: Titus Dubin M.D.   On: 12/14/2018 18:28     CBC Recent Labs  Lab 12/14/18 1736  WBC 7.7  HGB 17.5*  HCT 59.3*  PLT 178  MCV 93.5  MCH 27.6  MCHC 29.5*  RDW 15.9*    Chemistries  Recent Labs  Lab 12/14/18 1736 12/15/18 1359 12/16/18 0623  NA 144 140 137  K 4.1 4.2 4.5  CL 101 94* 87*  CO2 31 34* 41*  GLUCOSE 96 278* 130*  BUN 28* 29* 27*  CREATININE 0.82 0.86 0.77  CALCIUM 8.6* 8.6* 8.7*  AST 26  --   --   ALT 46*  --   --   ALKPHOS 121  --   --   BILITOT 1.0  --   --    ------------------------------------------------------------------------------------------------------------------ No results for input(s): CHOL, HDL, LDLCALC,  TRIG, CHOLHDL, LDLDIRECT in the last 72 hours.  Lab Results  Component Value Date   HGBA1C 8.2 (H) 12/14/2018   ------------------------------------------------------------------------------------------------------------------ No results for input(s): TSH, T4TOTAL, T3FREE, THYROIDAB in the last 72 hours.  Invalid input(s): FREET3 ------------------------------------------------------------------------------------------------------------------ No results for input(s): VITAMINB12, FOLATE, FERRITIN, TIBC, IRON, RETICCTPCT in the last 72 hours.  Coagulation profile No results for input(s): INR, PROTIME in the last 168 hours.  No results for input(s): DDIMER in the last 72 hours.  Cardiac Enzymes No results for input(s): CKMB, TROPONINI, MYOGLOBIN in the last 168 hours.  Invalid input(s): CK ------------------------------------------------------------------------------------------------------------------    Component Value Date/Time   BNP 78.0 12/14/2018 1815   Karess Harner M.D on 12/16/2018 at 12:49 PM  Go to [email or web address] - for contact info  [facility] - Office  [phone]

## 2018-12-16 NOTE — Progress Notes (Signed)
Knee hi ted hose applied per order.

## 2018-12-17 ENCOUNTER — Inpatient Hospital Stay (HOSPITAL_COMMUNITY): Payer: Medicare HMO

## 2018-12-17 LAB — BASIC METABOLIC PANEL
Anion gap: 16 — ABNORMAL HIGH (ref 5–15)
BUN: 27 mg/dL — ABNORMAL HIGH (ref 8–23)
CO2: 45 mmol/L — ABNORMAL HIGH (ref 22–32)
Calcium: 9.3 mg/dL (ref 8.9–10.3)
Chloride: 80 mmol/L — ABNORMAL LOW (ref 98–111)
Creatinine, Ser: 0.87 mg/dL (ref 0.61–1.24)
GFR calc Af Amer: >60 mL/min (ref >60)
GFR calc non Af Amer: >60 mL/min (ref >60)
Glucose, Bld: 149 mg/dL — ABNORMAL HIGH (ref 70–99)
Potassium: 4.1 mmol/L (ref 3.5–5.1)
Sodium: 141 mmol/L (ref 135–145)

## 2018-12-17 LAB — GLUCOSE, CAPILLARY
Glucose-Capillary: 161 mg/dL — ABNORMAL HIGH (ref 70–99)
Glucose-Capillary: 324 mg/dL — ABNORMAL HIGH (ref 70–99)
Glucose-Capillary: 274 mg/dL — ABNORMAL HIGH (ref 70–99)
Glucose-Capillary: 289 mg/dL — ABNORMAL HIGH (ref 70–99)

## 2018-12-17 NOTE — Progress Notes (Signed)
Patient Demographics:    Thomas [name], is a 63 y.o. male, DOB - [birth year], ZSW:[id number]  Admit date - 12/14/2018   Admitting Physician Truett Mainland, DO  Outpatient Primary MD for the patient is Monico Blitz, MD  LOS - 3   Chief Complaint  Patient presents with  . Shortness of Breath        Subjective:    Thomas [name] today has no fevers, no emesis,  No chest pain,   hypoxia, dyspnea on exertion and orthopnea persist, voiding well,   Assessment  & Plan :    Principal Problem:   Acute respiratory failure with hypoxia and hypercapnia (HCC) Active Problems:   Acute on chronic systolic CHF (congestive heart failure) (HCC)   DM (diabetes mellitus) ([place])   Morbid obesity ([place])   Ischemic cardiomyopathy   Tobacco abuse  Brief summary 63 y.o. male with a history of coronary artery disease with NSTEMI in December 2019, ischemic cardiomyopathy with EF of 45% on last echocardiogram 11/08/2018, diabetes, tobacco abuse and morbid obesity admitted 12/14/2018 as a direct admission from cardiology office for acute on chronic combined CHF exacerbation with about 30 pound weight gain -Chest x-ray with large right-sided pleural effusion  A/p 1)HFrEF--- admitted with  Acute on chronic combined heart failure exacerbation with evidence of fluid overload and hypoxic respiratory failure.   - hypoxia, dyspnea on exertion and orthopnea persist, -Weight down to 283 pounds from 303lb -Increase IV Lasix further to 80 mg every 12 hours, continue metolazone -Daily weight and fluid input and output monitoring -Monitor electrolytes and renal function with IV diuresis  2)Acute hypoxic Respiratory Failure-suspect due to CHF exacerbation,  hypoxia, dyspnea on exertion and orthopnea but states,, currently requiring 4 L of oxygen via nasal cannula down from 7 L,  anticipate that this will improve with more diuresis and  bronchodilators (smoker)  3)CAD- predominantly branch vessel with severe stenosis of the second obtuse marginal that was managed medically as of cardiac catheterization in December 2019.  No major obstructive disease within the larger epicardials. -continue Coreg, isosorbide continue aspirin , Lipitor and Plavix  4)Morbid obesity/OSA--patient is not compliant with Bipap/CPAP nightly. -= Patient apparently had sleep study in 2019 in [place], [place]  5)DM2--A1c 8.2, renal function appears okay, c/n metformin, hold Invokana for now, use Novolog/Humalog Sliding scale insulin with Accu-Cheks/Fingersticks as ordered   6)Tobacco abuse--smoking cessation advised, continue nicotine patch  7)Rt Pleural Effusion-radiology unable to perform therapeutic and diagnostic thoracentesis on 12/17/2018 as the pleural fluid amount on ultrasound is not as much as previously estimated by x-ray, also patient has elevated hemidiaphragm--we will continue iv diuresis to address volume status  8)Social/Ethics-- --Patient refusing lab work, from time to time refusing medications from time to time, --Patient tries to pick and choose which part of the medical therapy he will comply with --Pt is a full code  Disposition/Need for in-Hospital Stay- patient unable to be discharged at this time due to significant hypoxia, dyspnea on exertion and orthopnea and volume overload requiring oxygen via nasal cannula and IV diuresis  Code Status : Full  Family Communication:    (patient is alert, awake and coherent) Discussed with wife multiple times during this admission  Disposition Plan  : Home  Consults  :  cardiology  DVT Prophylaxis  :  Lovenox  - SCDs   Lab Results  Component Value Date   PLT 178 12/14/2018    Inpatient Medications  Scheduled Meds: . albuterol  2.5 mg Nebulization TID  . aspirin EC  81 mg Oral Daily  . atorvastatin  80 mg Oral q1800  . carvedilol  12.5 mg Oral BID  . clopidogrel  75 mg  Oral Daily  . enoxaparin (LOVENOX) injection  70 mg Subcutaneous Q24H  . furosemide  80 mg Intravenous BID  . insulin aspart  0-15 Units Subcutaneous TID WC  . insulin aspart  0-5 Units Subcutaneous QHS  . isosorbide mononitrate  30 mg Oral Daily  . metFORMIN  1,000 mg Oral BID WC  . metolazone  2.5 mg Oral BID  . nicotine  14 mg Transdermal Daily  . pantoprazole  40 mg Oral Daily  . potassium chloride  20 mEq Oral BID  . sodium chloride flush  3 mL Intravenous Q12H   Continuous Infusions: . sodium chloride     PRN Meds:.sodium chloride, acetaminophen, alum & mag hydroxide-simeth, ondansetron (ZOFRAN) IV, sodium chloride flush    Anti-infectives (From admission, onward)   None        Objective:   Vitals:   12/17/18 0500 12/17/18 0639 12/17/18 0806 12/17/18 1004  BP:  117/71  117/85  Pulse:  84  (!) 108  Resp:    20  Temp:  98.3 F (36.8 C)    TempSrc:  Oral    SpO2:  95% (!) 89% 92%  Weight: 128.3 kg     Height:        Wt Readings from Last 3 Encounters:  12/17/18 128.3 kg  12/14/18 (!) 137.5 kg  11/08/18 134.7 kg     Intake/Output Summary (Last 24 hours) at 12/17/2018 1157 Last data filed at 12/17/2018 0600 Gross per 24 hour  Intake 960 ml  Output 6050 ml  Net -5090 ml     Physical Exam  Gen:- Awake Alert, morbidly obese, patient with conversational dyspnea HEENT:- [place].AT, No sclera icterus Nose -[place] 4L/min Neck-Supple Neck, +ve JVD  Lungs-diminished in bases, Rt > Lt with bibasilar rales  CV- S1, S2 normal, regular  Abd-  +ve B.Sounds, Abd Soft, No tenderness, increased truncal adiposity    Extremity/Skin:+2 edema, pedal pulses present  Psych-affect is appropriate, oriented x3 Neuro-generalized weakness, no new focal deficits, no tremors   Data Review:   Micro Results Recent Results (from the past 240 hour(s))  SARS Coronavirus 2 [facility] order, Performed in [facility] hospital lab) Nasopharyngeal Nasopharyngeal Swab     Status: None    Collection Time: 12/14/18  5:56 PM   Specimen: Nasopharyngeal Swab  Result Value Ref Range Status   SARS Coronavirus 2 NEGATIVE NEGATIVE Final    Comment: (NOTE) If result is NEGATIVE SARS-CoV-2 target nucleic acids are NOT DETECTED. The SARS-CoV-2 RNA is generally detectable in upper and lower  respiratory specimens during the acute phase of infection. The lowest  concentration of SARS-CoV-2 viral copies this assay can detect is 250  copies / mL. A negative result does not preclude SARS-CoV-2 infection  and should not be used as the sole basis for treatment or other  patient management decisions.  A negative result may occur with  improper specimen collection / handling, submission of specimen other  than nasopharyngeal swab, presence of viral mutation(s) within the  areas targeted by this assay, and inadequate number of viral  copies  (<250 copies / mL). A negative result must be combined with clinical  observations, patient history, and epidemiological information. If result is POSITIVE SARS-CoV-2 target nucleic acids are DETECTED. The SARS-CoV-2 RNA is generally detectable in upper and lower  respiratory specimens dur ing the acute phase of infection.  Positive  results are indicative of active infection with SARS-CoV-2.  Clinical  correlation with patient history and other diagnostic information is  necessary to determine patient infection status.  Positive results do  not rule out bacterial infection or co-infection with other viruses. If result is PRESUMPTIVE POSTIVE SARS-CoV-2 nucleic acids MAY BE PRESENT.   A presumptive positive result was obtained on the submitted specimen  and confirmed on repeat testing.  While 2019 novel coronavirus  (SARS-CoV-2) nucleic acids may be present in the submitted sample  additional confirmatory testing may be necessary for epidemiological  and / or clinical management purposes  to differentiate between  SARS-CoV-2 and other Sarbecovirus  currently known to infect humans.  If clinically indicated additional testing with an alternate test  methodology [phone]) is advised. The SARS-CoV-2 RNA is generally  detectable in upper and lower respiratory sp ecimens during the acute  phase of infection. The expected result is Negative. Fact Sheet for Patients:  StrictlyIdeas.no Fact Sheet for Healthcare Providers: BankingDealers.co.za This test is not yet approved or cleared by the [place] FDA and has been authorized for detection and/or diagnosis of SARS-CoV-2 by FDA under an Emergency Use Authorization (EUA).  This EUA will remain in effect (meaning this test can be used) for the duration of the COVID-19 declaration under Section 564(b)(1) of the Act, 21 U.S.C. section 360bbb-3(b)(1), unless the authorization is terminated or revoked sooner. Performed at [facility] [address] [place], [place] [address]     Radiology Reports Dg Chest 2 View  Result Date: 12/16/2018 CLINICAL DATA:  Shortness of breath EXAM: CHEST - 2 VIEW COMPARISON:  12/14/2018 FINDINGS: Cardiomegaly. Right effusion with atelectasis of the right lower lung. Patchy atelectasis in the left lower lung. Upper lungs are clear. IMPRESSION: Large right effusion with right lower lung volume loss. Patchy atelectasis at the left base. Electronically Signed   By: Nelson Chimes M.D.   On: 12/16/2018 11:41   [place] Chest (pleural Effusion)  Result Date: 12/17/2018 CLINICAL DATA:  CHF, RIGHT pleural effusion, shortness of breath EXAM: CHEST ULTRASOUND COMPARISON:  Chest radiograph 12/16/2018 FINDINGS: Sonography demonstrates a small pleural effusion at the RIGHT lung base. Amount of pleural effusion is less than anticipated from the chest radiograph, which is due to elevation of the diaphragm sonographically. Thoracentesis not performed at this time. IMPRESSION: Small RIGHT pleural effusion. Electronically Signed   By: Lavonia Dana M.D.   On: 12/17/2018 10:31   Dg Chest Portable 1 View  Result Date: 12/14/2018 CLINICAL DATA:  Shortness of breath for the past month. 30 pound weight gain. EXAM: PORTABLE CHEST 1 VIEW COMPARISON:  Chest x-ray dated March 29, 2018. FINDINGS: The heart is borderline enlarged size. Mild pulmonary vascular congestion. Small right pleural effusion with adjacent right basilar atelectasis. No pneumothorax. No acute osseous abnormality. IMPRESSION: 1. Small right pleural effusion with adjacent right basilar atelectasis. Electronically Signed   By: Titus Dubin M.D.   On: 12/14/2018 18:28     CBC Recent Labs  Lab 12/14/18 1736  WBC 7.7  HGB 17.5*  HCT 59.3*  PLT 178  MCV 93.5  MCH 27.6  MCHC 29.5*  RDW 15.9*    Chemistries  Recent Labs  Lab 12/14/18 1736 12/15/18 1359 12/16/18 0623 12/17/18 0508  NA 144 140 137 141  K 4.1 4.2 4.5 4.1  CL 101 94* 87* 80*  CO2 31 34* 41* 45*  GLUCOSE 96 278* 130* 149*  BUN 28* 29* 27* 27*  CREATININE 0.82 0.86 0.77 0.87  CALCIUM 8.6* 8.6* 8.7* 9.3  AST 26  --   --   --   ALT 46*  --   --   --   ALKPHOS 121  --   --   --   BILITOT 1.0  --   --   --    ------------------------------------------------------------------------------------------------------------------ No results for input(s): CHOL, HDL, LDLCALC, TRIG, CHOLHDL, LDLDIRECT in the last 72 hours.  Lab Results  Component Value Date   HGBA1C 8.2 (H) 12/14/2018   ------------------------------------------------------------------------------------------------------------------ No results for input(s): TSH, T4TOTAL, T3FREE, THYROIDAB in the last 72 hours.  Invalid input(s): FREET3 ------------------------------------------------------------------------------------------------------------------ No results for input(s): VITAMINB12, FOLATE, FERRITIN, TIBC, IRON, RETICCTPCT in the last 72 hours.  Coagulation profile No results for input(s): INR, PROTIME in the last 168 hours.   No results for input(s): DDIMER in the last 72 hours.  Cardiac Enzymes No results for input(s): CKMB, TROPONINI, MYOGLOBIN in the last 168 hours.  Invalid input(s): CK ------------------------------------------------------------------------------------------------------------------    Component Value Date/Time   BNP 78.0 12/14/2018 1815   Kinley Dozier M.D on 12/17/2018 at 11:57 AM  Go to [email or web address] - for contact info  [facility] - Office  [phone]

## 2018-12-17 NOTE — Progress Notes (Signed)
CPAP at bedside, patient still refusing at this time.

## 2018-12-17 NOTE — TOC Initial Note (Signed)
Transition of Care [facility]) - Initial/Assessment Note    Patient Details  Name: Thomas [name] MRN: [id number] Date of Birth: November 24, 1955  Transition of Care [facility]) CM/SW Contact:    Boneta Lucks, RN Phone Number: 12/17/2018, 11:58 AM  Clinical Narrative:     CM referral for heart failure. Patient admitted for acute respiratory failure. Patient continues to diuresis , down 18 lbs.   Patient continues to be using 02, will assess closer to discharge the need for home 02. Has not used at home in the past. Patient educated on the importance of daily weights. Has a scales but does not weight. Patient is blaming change in medication on the 30 lb weight gain. Patient continues to drive and performs his ADL's lives at home with his wife. Patient is wanting to leave and continues to ask for food during conversation. TOC to follow for 02 needs.              Barriers to Discharge: Continued Medical Work up   Patient Goals and CMS Choice Patient states their goals for this hospitalization and ongoing recovery are:: to go home.      Expected Discharge Plan and Services         Living arrangements for the past 2 months: Single Family Home                       Prior Living Arrangements/Services Living arrangements for the past 2 months: Single Family Home Lives with:: Spouse   Do you feel safe going back to the place where you live?: Yes      Need for Family Participation in Patient Care: Yes (Comment) Care giver support system in place?: Yes (comment)   Criminal Activity/Legal Involvement Pertinent to Current Situation/Hospitalization: No - Comment as needed  Activities of Daily Living Home Assistive Devices/Equipment: None ADL Screening (condition at time of admission) Patient's cognitive ability adequate to safely complete daily activities?: No Is the patient deaf or have difficulty hearing?: No Does the patient have difficulty seeing, even when wearing glasses/contacts?: No Does  the patient have difficulty concentrating, remembering, or making decisions?: No Patient able to express need for assistance with ADLs?: No Does the patient have difficulty dressing or bathing?: No Independently performs ADLs?: Yes (appropriate for developmental age) Does the patient have difficulty walking or climbing stairs?: No Weakness of Legs: None Weakness of Arms/Hands: None  Permission Sought/Granted                  Emotional Assessment Appearance:: Appears older than stated age Attitude/Demeanor/Rapport: Complaining Affect (typically observed): Agitated, Apprehensive Orientation: : Oriented to Self, Oriented to Place, Oriented to  Time, Oriented to Situation Alcohol / Substance Use: Not Applicable Psych Involvement: No (comment)  Admission diagnosis:  Acute on chronic congestive heart failure, unspecified heart failure type [facility]) [I50.9] Patient Active Problem List   Diagnosis Date Noted  . Acute respiratory failure with hypoxia and hypercapnia ([place]) 12/14/2018  . Acute on chronic systolic CHF (congestive heart failure) ([place]) 12/14/2018  . Morbid obesity ([place]) 03/31/2018  . Ischemic cardiomyopathy 03/31/2018  . Tobacco abuse 03/31/2018  . DM (diabetes mellitus) ([place]) 03/31/2018  . NSTEMI (non-ST elevated myocardial infarction) ([place]) 03/29/2018   PCP:  Monico Blitz, MD Pharmacy:   [id number] [place] [place] [address] Phone: [phone] Fax: [phone]  Readmission Risk Interventions Readmission Risk Prevention Plan 12/17/2018  Medication Screening Complete  Transportation Screening Complete

## 2018-12-17 NOTE — Care Management Important Message (Signed)
Important Message  Patient Details  Name: Thomas [name] MRN: [id number] Date of Birth: [birth year]   Medicare Important Message Given:  Yes     [name] 12/17/2018, 2:50 PM

## 2018-12-17 NOTE — Progress Notes (Signed)
Inpatient Diabetes Program Recommendations  AACE/ADA: New Consensus Statement on Inpatient Glycemic Control   Target Ranges:  Prepandial:   less than 140 mg/dL      Peak postprandial:   less than 180 mg/dL (1-2 hours)      Critically ill patients:  140 - 180 mg/dL   Results for Thomas [name], Thomas [name] (MRN [id number]) as of 12/17/2018 11:25  Ref. Range 12/16/2018 07:42 12/16/2018 11:24 12/16/2018 16:28 12/16/2018 20:10 12/17/2018 07:36 12/17/2018 11:14  Glucose-Capillary Latest Ref Range: 70 - 99 mg/dL 150 (H) 214 (H) 158 (H) 237 (H) 161 (H) 324 (H)   Review of Glycemic Control  Diabetes history: DM2 Outpatient Diabetes medications: Invokana 300 mg QAM, Metformin XR 1000 mg BID Current orders for Inpatient glycemic control: Novolog 0-15 units TID with meals, Novolog 0-5 units QHS, Metformin 1000 mg BID  Inpatient Diabetes Program Recommendations:   Insulin-Correction: Noted NO Novolog has been given since admitted. Spoke to patient regarding need for insulin while inpatient for hyperglycemia. Pt states he is "afraid of insulin because it killed so many of my family members."  Tried to explain insulin and how it works and that people have complications from diabetes when it is uncontrolled. Pt states he will think about taking insulin as an inpatient.  NOTE:  Spoke with patient over the phone about diabetes and home regimen for diabetes control. Patient reports being followed by PCP for diabetes management and currently taking Invokana 300 mg QAM and Metformin XR 1000 mg BID as an outpatient for diabetes control.  Patient reports checking glucose 1 time per day and that it is usually in the 140's mg/dl in the morning.   Inquired about prior A1C and patient reports last A1C value was 7.2% on November 08, 2018.  Discussed A1C results (8.2% on 8/28 ) and explained that current A1C indicates an average glucose of 189 mg/dl over the past 2-3 months. Explained that glucose may be going up in the afternoon especially  after eating.  Discussed importance of checking CBGs and maintaining good CBG control to prevent long-term and short-term complications. Inquired about taking insulin as an inpatient for hyperglycemia. Patient states he is not going to take insulin. In further talking with him, he stated that he is "afraid of insulin because it killed so many of my family members."  Discussed insulin and how it works for DM control. Explained that people with DM don't die from insulin; that if DM is uncontrolled it leads to complications that can be fatal.  Explained how hyperglycemia leads to damage within blood vessels which lead to the common complications seen with uncontrolled diabetes. Patient then stated, "if I take insulin now my body will become dependent on it and I will have to take it from now on."  Discussed that his A1C is 8.2% with current outpatient DM medications and that he may need some additional DM medication adjustments but he does not need insulin as an outpatient at this time. Discussed how DM2 is a progressive disease and the pancreas can lose the ability to make enough insulin.   Asked patient to consider taking insulin as an inpatient and he states he will think about it.  Encouraged patient to follow up with PCP regarding DM control.  Patient verbalized understanding of information discussed and reports no further questions at this time related to diabetes.  Thanks, Barnie Alderman, RN, MSN, CDE Diabetes Coordinator Inpatient Diabetes Program [phone] (Team Pager)

## 2018-12-18 LAB — BASIC METABOLIC PANEL
Anion gap: 18 — ABNORMAL HIGH (ref 5–15)
BUN: 39 mg/dL — ABNORMAL HIGH (ref 8–23)
CO2: 44 mmol/L — ABNORMAL HIGH (ref 22–32)
Calcium: 9.5 mg/dL (ref 8.9–10.3)
Chloride: 78 mmol/L — ABNORMAL LOW (ref 98–111)
Creatinine, Ser: 0.83 mg/dL (ref 0.61–1.24)
GFR calc Af Amer: >60 mL/min (ref >60)
GFR calc non Af Amer: >60 mL/min (ref >60)
Glucose, Bld: 179 mg/dL — ABNORMAL HIGH (ref 70–99)
Potassium: 3.9 mmol/L (ref 3.5–5.1)
Sodium: 140 mmol/L (ref 135–145)

## 2018-12-18 LAB — GLUCOSE, CAPILLARY
Glucose-Capillary: 241 mg/dL — ABNORMAL HIGH (ref 70–99)
Glucose-Capillary: 263 mg/dL — ABNORMAL HIGH (ref 70–99)
Glucose-Capillary: 265 mg/dL — ABNORMAL HIGH (ref 70–99)
Glucose-Capillary: 264 mg/dL — ABNORMAL HIGH (ref 70–99)

## 2018-12-18 MED ORDER — ALBUTEROL SULFATE (2.5 MG/3ML) 0.083% IN NEBU
2.5000 mg | INHALATION_SOLUTION | Freq: Three times a day (TID) | RESPIRATORY_TRACT | Status: DC
  Administered 2018-12-18 – 2018-12-20 (×6): 2.5 mg via RESPIRATORY_TRACT
  Filled 2018-12-18 (×9): qty 3

## 2018-12-18 MED ORDER — ENOXAPARIN SODIUM 60 MG/0.6ML ~~LOC~~ SOLN
60.0000 mg | SUBCUTANEOUS | Status: DC
  Filled 2018-12-18: qty 0.6

## 2018-12-18 NOTE — Progress Notes (Signed)
Pt in the shower

## 2018-12-18 NOTE — Progress Notes (Signed)
Patient Demographics:    Thomas [name], is a 63 y.o. male, DOB - March 31, 1956, OQH:[id number]  Admit date - 12/14/2018   Admitting Physician Truett Mainland, DO  Outpatient Primary MD for the patient is Thomas Blitz, MD  LOS - 4   Chief Complaint  Patient presents with  . Shortness of Breath        Subjective:    Thomas [name] has no fevers, no emesis,  No chest pain,  Continues to refuse CPAP -Voiding well -Orthopnea better, dyspnea on exertion persist   Assessment  & Plan :    Principal Problem:   Acute respiratory failure with hypoxia and hypercapnia (HCC) Active Problems:   Acute on chronic systolic CHF (congestive heart failure) (HCC)   DM (diabetes mellitus) ([place])   Morbid obesity (HCC)   Ischemic cardiomyopathy   Tobacco abuse  Brief summary 63 y.o. male with a history of coronary artery disease with NSTEMI in December 2019, ischemic cardiomyopathy with EF of 45% on last echocardiogram 11/08/2018, diabetes, tobacco abuse and morbid obesity admitted 12/14/2018 as a direct admission from cardiology office for acute on chronic combined CHF exacerbation with about 30 pound weight gain   A/p 1)HFrEF--- admitted with  Acute on chronic combined heart failure exacerbation with evidence of fluid overload and hypoxic respiratory failure.   - hypoxia, dyspnea on exertion persist, orthopnea is improved -Weight down to 279 pounds from 303lb -Fluid balance remains negative -Continue IV Lasix further to 80 mg every 12 hours, continue metolazone -Daily weight and fluid input and output monitoring -Monitor electrolytes and renal function with IV diuresis  2)Acute hypoxic Respiratory Failure-suspect due to CHF exacerbation,  hypoxia, dyspnea on exertion and orthopnea but states,, currently requiring 4 to 5L of oxygen via nasal cannula down from 7 L,  anticipate that this will improve with more  diuresis and bronchodilators (smoker)  3)CAD- predominantly branch vessel with [name] stenosis of the second obtuse marginal that was managed medically as of cardiac catheterization in December 2019.  No major obstructive disease within the larger epicardials. -continue Coreg, isosorbide continue aspirin , Lipitor and Plavix  4)Morbid obesity/OSA--patient is not compliant with Bipap/CPAP nightly. -= Patient apparently had sleep study in 2019 in [place], [place] and he was advised to use a CPAP nightly  5)DM2--A1c 8.2, renal function appears okay, c/n metformin, hold Invokana for now, use Novolog/Humalog Sliding scale insulin with Accu-Cheks/Fingersticks as ordered   6)Tobacco abuse--smoking cessation advised, continue nicotine patch  7)Rt Pleural Effusion-radiology unable to perform therapeutic and diagnostic thoracentesis on 12/17/2018 as the pleural fluid amount on ultrasound is not as much as previously estimated by x-ray, also patient has elevated hemidiaphragm--we will continue iv diuresis to address volume status -Repeat chest x-ray on 12/19/2018  8)Social/Ethics-- --Patient refusing lab work, from time to time refusing medications from time to time, --Patient tries to pick and choose which part of the medical therapy he will comply with --Pt is a full code  Disposition/Need for in-Hospital Stay- patient unable to be discharged at this time due to significant hypoxia, dyspnea on exertion and orthopnea and volume overload requiring oxygen via nasal cannula and IV diuresis  Code Status : Full  Family Communication:    (patient is alert, awake and  coherent) Discussed with wife multiple times during this admission  Disposition Plan  : Home  Consults  :  cardiology  DVT Prophylaxis  :  Lovenox  - SCDs   Lab Results  Component Value Date   PLT 178 12/14/2018    Inpatient Medications  Scheduled Meds: . albuterol  2.5 mg Nebulization TID  . aspirin EC  81 mg Oral Daily  .  atorvastatin  80 mg Oral q1800  . carvedilol  12.5 mg Oral BID  . clopidogrel  75 mg Oral Daily  . enoxaparin (LOVENOX) injection  60 mg Subcutaneous Q24H  . furosemide  80 mg Intravenous BID  . insulin aspart  0-15 Units Subcutaneous TID WC  . insulin aspart  0-5 Units Subcutaneous QHS  . isosorbide mononitrate  30 mg Oral Daily  . metFORMIN  1,000 mg Oral BID WC  . metolazone  2.5 mg Oral BID  . nicotine  14 mg Transdermal Daily  . pantoprazole  40 mg Oral Daily  . potassium chloride  20 mEq Oral BID  . sodium chloride flush  3 mL Intravenous Q12H   Continuous Infusions: . sodium chloride     PRN Meds:.sodium chloride, acetaminophen, alum & mag hydroxide-simeth, ondansetron (ZOFRAN) IV, sodium chloride flush    Anti-infectives (From admission, onward)   None        Objective:   Vitals:   12/17/18 1934 12/17/18 2012 12/18/18 0645 12/18/18 0830  BP:  (!) 106/49    Pulse:  76    Resp:  18    Temp:  98.5 F (36.9 C)    TempSrc:  Oral    SpO2: (!) 89% 93%  90%  Weight:   126.8 kg   Height:        Wt Readings from Last 3 Encounters:  12/18/18 126.8 kg  12/14/18 (!) 137.5 kg  11/08/18 134.7 kg     Intake/Output Summary (Last 24 hours) at 12/18/2018 1039 Last data filed at 12/17/2018 1424 Gross per 24 hour  Intake -  Output 2000 ml  Net -2000 ml     Physical Exam  Gen:- Awake Alert, morbidly obese, some conversational dyspnea HEENT:- [place].AT, No sclera icterus Nose -[place] 4 to 5 L/min Neck-Supple Neck, +ve JVD  Lungs-diminished in bases, Rt > Lt with bibasilar rales  CV- S1, S2 normal, regular  Abd-  +ve B.Sounds, Abd Soft, No tenderness, increased truncal adiposity    Extremity/Skin:+2 edema, TEDs on, pedal pulses present  Psych-affect is appropriate, oriented x3 Neuro-generalized weakness, no new focal deficits, no tremors   Data Review:   Micro Results Recent Results (from the past 240 hour(s))  SARS Coronavirus 2 [facility] order, Performed in [facility] hospital lab) Nasopharyngeal Nasopharyngeal Swab     Status: None   Collection Time: 12/14/18  5:56 PM   Specimen: Nasopharyngeal Swab  Result Value Ref Range Status   SARS Coronavirus 2 NEGATIVE NEGATIVE Final    Comment: (NOTE) If result is NEGATIVE SARS-CoV-2 target nucleic acids are NOT DETECTED. The SARS-CoV-2 RNA is generally detectable in upper and lower  respiratory specimens during the acute phase of infection. The lowest  concentration of SARS-CoV-2 viral copies this assay can detect is 250  copies / mL. A negative result does not preclude SARS-CoV-2 infection  and should not be used as the sole basis for treatment or other  patient management decisions.  A negative result may occur with  improper specimen collection / handling, submission of specimen other  than  nasopharyngeal swab, presence of viral mutation(s) within the  areas targeted by this assay, and inadequate number of viral copies  (<250 copies / mL). A negative result must be combined with clinical  observations, patient history, and epidemiological information. If result is POSITIVE SARS-CoV-2 target nucleic acids are DETECTED. The SARS-CoV-2 RNA is generally detectable in upper and lower  respiratory specimens dur ing the acute phase of infection.  Positive  results are indicative of active infection with SARS-CoV-2.  Clinical  correlation with patient history and other diagnostic information is  necessary to determine patient infection status.  Positive results do  not rule out bacterial infection or co-infection with other viruses. If result is PRESUMPTIVE POSTIVE SARS-CoV-2 nucleic acids MAY BE PRESENT.   A presumptive positive result was obtained on the submitted specimen  and confirmed on repeat testing.  While 2019 novel coronavirus  (SARS-CoV-2) nucleic acids may be present in the submitted sample  additional confirmatory testing may be necessary for epidemiological  and / or clinical management  purposes  to differentiate between  SARS-CoV-2 and other Sarbecovirus currently known to infect humans.  If clinically indicated additional testing with an alternate test  methodology [phone]) is advised. The SARS-CoV-2 RNA is generally  detectable in upper and lower respiratory sp ecimens during the acute  phase of infection. The expected result is Negative. Fact Sheet for Patients:  StrictlyIdeas.no Fact Sheet for Healthcare Providers: BankingDealers.co.za This test is not yet approved or cleared by the [place] FDA and has been authorized for detection and/or diagnosis of SARS-CoV-2 by FDA under an Emergency Use Authorization (EUA).  This EUA will remain in effect (meaning this test can be used) for the duration of the COVID-19 declaration under Section 564(b)(1) of the Act, 21 U.S.C. section 360bbb-3(b)(1), unless the authorization is terminated or revoked sooner. Performed at [facility], [address] [place], [place] [address]     Radiology Reports Dg Chest 2 View  Result Date: 12/16/2018 CLINICAL DATA:  Shortness of breath EXAM: CHEST - 2 VIEW COMPARISON:  12/14/2018 FINDINGS: Cardiomegaly. Right effusion with atelectasis of the right lower lung. Patchy atelectasis in the left lower lung. Upper lungs are clear. IMPRESSION: Large right effusion with right lower lung volume loss. Patchy atelectasis at the left base. Electronically Signed   By: Nelson Chimes M.D.   On: 12/16/2018 11:41   [place] Chest (pleural Effusion)  Result Date: 12/17/2018 CLINICAL DATA:  CHF, RIGHT pleural effusion, shortness of breath EXAM: CHEST ULTRASOUND COMPARISON:  Chest radiograph 12/16/2018 FINDINGS: Sonography demonstrates a small pleural effusion at the RIGHT lung base. Amount of pleural effusion is less than anticipated from the chest radiograph, which is due to elevation of the diaphragm sonographically. Thoracentesis not performed at this time.  IMPRESSION: Small RIGHT pleural effusion. Electronically Signed   By: Lavonia Dana M.D.   On: 12/17/2018 10:31   Dg Chest Portable 1 View  Result Date: 12/14/2018 CLINICAL DATA:  Shortness of breath for the past month. 30 pound weight gain. EXAM: PORTABLE CHEST 1 VIEW COMPARISON:  Chest x-ray dated March 29, 2018. FINDINGS: The heart is borderline enlarged size. Mild pulmonary vascular congestion. Small right pleural effusion with adjacent right basilar atelectasis. No pneumothorax. No acute osseous abnormality. IMPRESSION: 1. Small right pleural effusion with adjacent right basilar atelectasis. Electronically Signed   By: Titus Dubin M.D.   On: 12/14/2018 18:28     CBC Recent Labs  Lab 12/14/18 1736  WBC 7.7  HGB 17.5*  HCT 59.3*  PLT 178  MCV 93.5  MCH 27.6  MCHC 29.5*  RDW 15.9*    Chemistries  Recent Labs  Lab 12/14/18 1736 12/15/18 1359 12/16/18 0623 12/17/18 0508 12/18/18 0456  NA 144 140 137 141 140  K 4.1 4.2 4.5 4.1 3.9  CL 101 94* 87* 80* 78*  CO2 31 34* 41* 45* 44*  GLUCOSE 96 278* 130* 149* 179*  BUN 28* 29* 27* 27* 39*  CREATININE 0.82 0.86 0.77 0.87 0.83  CALCIUM 8.6* 8.6* 8.7* 9.3 9.5  AST 26  --   --   --   --   ALT 46*  --   --   --   --   ALKPHOS 121  --   --   --   --   BILITOT 1.0  --   --   --   --    ------------------------------------------------------------------------------------------------------------------ No results for input(s): CHOL, HDL, LDLCALC, TRIG, CHOLHDL, LDLDIRECT in the last 72 hours.  Lab Results  Component Value Date   HGBA1C 8.2 (H) 12/14/2018   ------------------------------------------------------------------------------------------------------------------ No results for input(s): TSH, T4TOTAL, T3FREE, THYROIDAB in the last 72 hours.  Invalid input(s): FREET3 ------------------------------------------------------------------------------------------------------------------ No results for input(s): VITAMINB12,  FOLATE, FERRITIN, TIBC, IRON, RETICCTPCT in the last 72 hours.  Coagulation profile No results for input(s): INR, PROTIME in the last 168 hours.  No results for input(s): DDIMER in the last 72 hours.  Cardiac Enzymes No results for input(s): CKMB, TROPONINI, MYOGLOBIN in the last 168 hours.  Invalid input(s): CK ------------------------------------------------------------------------------------------------------------------    Component Value Date/Time   BNP 78.0 12/14/2018 1815   Landa Mullinax M.D on 12/18/2018 at 10:39 AM  Go to [email or web address] - for contact info  [facility] - Office  [phone]

## 2018-12-19 ENCOUNTER — Inpatient Hospital Stay (HOSPITAL_COMMUNITY): Payer: Medicare HMO

## 2018-12-19 DIAGNOSIS — J90 Pleural effusion, not elsewhere classified: Secondary | ICD-10-CM | POA: Diagnosis present

## 2018-12-19 DIAGNOSIS — J189 Pneumonia, unspecified organism: Secondary | ICD-10-CM | POA: Clinically undetermined

## 2018-12-19 LAB — BASIC METABOLIC PANEL
Anion gap: 11 (ref 5–15)
BUN: 44 mg/dL — ABNORMAL HIGH (ref 8–23)
CO2: 50 mmol/L — ABNORMAL HIGH (ref 22–32)
Calcium: 9.2 mg/dL (ref 8.9–10.3)
Chloride: 77 mmol/L — ABNORMAL LOW (ref 98–111)
Creatinine, Ser: 0.89 mg/dL (ref 0.61–1.24)
GFR calc Af Amer: >60 mL/min (ref >60)
GFR calc non Af Amer: >60 mL/min (ref >60)
Glucose, Bld: 200 mg/dL — ABNORMAL HIGH (ref 70–99)
Potassium: 3.3 mmol/L — ABNORMAL LOW (ref 3.5–5.1)
Sodium: 138 mmol/L (ref 135–145)

## 2018-12-19 LAB — GLUCOSE, CAPILLARY
Glucose-Capillary: 241 mg/dL — ABNORMAL HIGH (ref 70–99)
Glucose-Capillary: 329 mg/dL — ABNORMAL HIGH (ref 70–99)
Glucose-Capillary: 213 mg/dL — ABNORMAL HIGH (ref 70–99)

## 2018-12-19 LAB — CBC
HCT: 61.9 % — ABNORMAL HIGH (ref 39.0–52.0)
Hemoglobin: 18.7 g/dL — ABNORMAL HIGH (ref 13.0–17.0)
MCH: 27.5 pg (ref 26.0–34.0)
MCHC: 30.2 g/dL (ref 30.0–36.0)
MCV: 90.9 fL (ref 80.0–100.0)
Platelets: 180 10*3/uL (ref 150–400)
RBC: 6.81 MIL/uL — ABNORMAL HIGH (ref 4.22–5.81)
RDW: 15.1 % (ref 11.5–15.5)
WBC: 8.7 10*3/uL (ref 4.0–10.5)
nRBC: 0 % (ref 0.0–0.2)

## 2018-12-19 MED ORDER — SODIUM CHLORIDE 0.9 % IV SOLN
2.0000 g | INTRAVENOUS | Status: DC
  Administered 2018-12-21: 2 g via INTRAVENOUS
  Filled 2018-12-19 (×3): qty 20
  Filled 2018-12-19: qty 2

## 2018-12-19 MED ORDER — SODIUM CHLORIDE 0.9 % IV SOLN
500.0000 mg | INTRAVENOUS | Status: DC
  Administered 2018-12-19: 500 mg via INTRAVENOUS
  Filled 2018-12-19 (×2): qty 500

## 2018-12-19 MED ORDER — GUAIFENESIN ER 600 MG PO TB12
600.0000 mg | ORAL_TABLET | Freq: Two times a day (BID) | ORAL | Status: DC
  Administered 2018-12-20 – 2018-12-22 (×5): 600 mg via ORAL
  Filled 2018-12-19 (×5): qty 1

## 2018-12-19 MED ORDER — POTASSIUM CHLORIDE CRYS ER 20 MEQ PO TBCR
40.0000 meq | EXTENDED_RELEASE_TABLET | ORAL | Status: AC
  Administered 2018-12-19 (×2): 40 meq via ORAL
  Filled 2018-12-19 (×2): qty 2

## 2018-12-19 MED ORDER — CANAGLIFLOZIN 300 MG PO TABS: 300.0000 mg | ORAL_TABLET | Freq: Every day | ORAL | Status: DC

## 2018-12-19 MED ORDER — ALPRAZOLAM 0.25 MG PO TABS
0.2500 mg | ORAL_TABLET | Freq: Once | ORAL | Status: AC
  Administered 2018-12-19: 21:00:00 0.25 mg via ORAL
  Filled 2018-12-19: qty 1

## 2018-12-19 MED ORDER — GLIPIZIDE 10 MG PO TABS
10.0000 mg | ORAL_TABLET | Freq: Two times a day (BID) | ORAL | Status: DC
  Administered 2018-12-19 – 2018-12-20 (×2): 10 mg via ORAL
  Filled 2018-12-19 (×2): qty 2
  Filled 2018-12-19: qty 1
  Filled 2018-12-19: qty 2

## 2018-12-19 NOTE — Progress Notes (Signed)
Inpatient Diabetes Program Recommendations  AACE/ADA: New Consensus Statement on Inpatient Glycemic Control   Target Ranges:  Prepandial:   less than 140 mg/dL      Peak postprandial:   less than 180 mg/dL (1-2 hours)      Critically ill patients:  140 - 180 mg/dL   Results for Thomas [name], Thomas [name] (MRN [id number]) as of 12/19/2018 12:07  Ref. Range 12/18/2018 07:22 12/18/2018 11:22 12/18/2018 16:18 12/18/2018 20:07 12/19/2018 08:08 12/19/2018 11:22  Glucose-Capillary Latest Ref Range: 70 - 99 mg/dL 241 (H) 263 (H) 265 (H) 264 (H) 241 (H) 329 (H)   Review of Glycemic Control  Diabetes history: DM2 Outpatient Diabetes medications: Invokana 300 mg QAM, Metformin XR 1000 mg BID Current orders for Inpatient glycemic control: Novolog 0-15 units TID with meals, Novolog 0-5 units QHS, Metformin 1000 mg BID  Inpatient Diabetes Program Recommendations:   Oral DM medications: Since patient is refusing to take insulin while inpatient and glucose is consistently 241-329 mg/dl, please consider ordering Invokana 300 mg daily.  Thanks, Barnie Alderman, RN, MSN, CDE Diabetes Coordinator Inpatient Diabetes Program [phone] (Team Pager from 8am to 5pm)

## 2018-12-19 NOTE — Progress Notes (Signed)
DME Choices: [facility] [address] [place], [place]  [place], [place] [address] [phone] [facility] [place], [place] [address] [phone]

## 2018-12-19 NOTE — Progress Notes (Signed)
Central telemetry informed this nurse patient having wide QRS, tachycardia. MD aware.

## 2018-12-19 NOTE — Progress Notes (Signed)
Central telemetry informed this nurse patient has a wide complex tachycardia. This was reported to MD. Patient is stable.

## 2018-12-19 NOTE — TOC Progression Note (Signed)
Transition of Care [facility]) - Progression Note    Patient Details  Name: Thomas [name] MRN: [id number] Date of Birth: October 31, 1955  Transition of Care [facility]) CM/SW Contact  Boneta Lucks, RN Phone Number: 12/19/2018, 11:44 AM  Clinical Narrative:   Patient continuing to need oxygen at 4L. MD stating need for home 02.  Spoke with patient, he has not preferences.  Referral given to Blake Divine with [place]. TOC to follow for orders and patient continues to diuresis.       Barriers to Discharge: Continued Medical Work up  Expected Discharge Plan and Services     Discharge Planning Services: CM Consult   Living arrangements for the past 2 months: Single Family Home                 DME Arranged: Oxygen DME Agency: [employer] Date DME Agency Contacted: 12/19/18 Time DME Agency Contacted: [phone] Representative spoke with at DME Agency: Blake Divine     Readmission Risk Interventions Readmission Risk Prevention Plan 12/17/2018  Medication Screening Complete  Transportation Screening Complete

## 2018-12-19 NOTE — Progress Notes (Signed)
Still refusing CPAP.

## 2018-12-19 NOTE — Care Management Important Message (Signed)
Important Message  Patient Details  Name: Thomas [name] MRN: [id number] Date of Birth: 04-27-1955   Medicare Important Message Given:  Yes     [name] 12/19/2018, 1:46 PM

## 2018-12-19 NOTE — Progress Notes (Addendum)
Patient Demographics:    Thomas [name], is a 63 y.o. male, DOB - [birth year], XBW:[id number]  Admit date - 12/14/2018   Admitting Physician Truett Mainland, DO  Outpatient Primary MD for the patient is Monico Blitz, MD  LOS - 5   Chief Complaint  Patient presents with  . Shortness of Breath        Subjective:    Thomas [name] today has no fevers, no emesis,  No chest pain,   -Shortness of breath persist, O2 requirement down to 4 L/min unable to wean beyond that, -Occasional cough, No fevers or chills   Assessment  & Plan :    Principal Problem:   Acute respiratory failure with hypoxia and hypercapnia (HCC) Active Problems:   Acute on chronic systolic CHF (congestive heart failure) (HCC)   DM (diabetes mellitus) ([place])   Morbid obesity ([place])   Ischemic cardiomyopathy   Tobacco abuse   Loculated pleural effusion on right   Right-sided multifocal PNA (pneumonia)  Brief summary 63 y.o. male with a history of coronary artery disease with NSTEMI in December 2019, ischemic cardiomyopathy with EF of 45% on last echocardiogram 11/08/2018, diabetes, tobacco abuse and morbid obesity admitted 12/14/2018 as a direct admission from cardiology office for acute on chronic combined CHF exacerbation with about 30 pound weight gain   A/p 1)HFrEF--- admitted with  Acute on chronic combined heart failure exacerbation with evidence of fluid overload and hypoxic respiratory failure.   -Last known EF 45% based on echo from 11/08/2018 - hypoxia, dyspnea on exertion persist, orthopnea is improved -Weight down to 275 pounds from 303lb -Fluid balance remains negative -Continue IV Lasix further to 80 mg every 12 hours, continue metolazone -Daily weight and fluid input and output monitoring -Monitor electrolytes and renal function with IV diuresis  2)Acute hypoxic Respiratory Failure-suspect due to CHF exacerbation--CT  chest suggest possible pneumonia,  hypoxia, dyspnea on exertion and orthopnea but states,, currently requiring 4 to 5L of oxygen via nasal cannula down from 7 L,  anticipate that this will improve with more diuresis and bronchodilators (smoker)  3) possible right-sided pneumonia--CT chest from 12/19/2018 noted, with possible multifocal pneumonia and loculated right-sided pleural effusion -No fevers or leukocytosis -Treat empirically with IV Rocephin/azithromycin,  -right-sided diagnostic and therapeutic ultrasound-guided thoracentesis requested with fluid studies =-Continue submental oxygen at bronchodilators as ordered  4)Morbid obesity/OSA--patient is not compliant with Bipap/CPAP nightly. -= Patient apparently had sleep study in 2019 in [place], [place] and he was advised to use a CPAP nightly  5)DM2--A1c 8.2, renal function appears okay, c/n metformin, hold Invokana for now, use Novolog/Humalog Sliding scale insulin with Accu-Cheks/Fingersticks as ordered -Patient refusing subcu insulin, he is willing to use glipizide instead  6)Tobacco abuse--smoking cessation advised, continue nicotine patch  7)Rt Pleural Effusion- CT chest without contrast from 12/19/2018 noted with possible loculated right-sided pleural effusion and possible multifocal pneumonia --right-sided diagnostic and therapeutic ultrasound-guided thoracentesis requested with fluid studies  8)CAD- predominantly branch vessel with severe stenosis of the second obtuse marginal that was managed medically as of cardiac catheterization in December 2019.  No major obstructive disease within the larger epicardials. -continue Coreg, isosorbide continue aspirin , Lipitor and Plavix  9)Social/Ethics-- --Patient refusing lab work, from time to time refusing  medications from time to time, --Patient tries to pick and choose which part of the medical therapy he will comply with --Pt is a full code  10) morbid obesity/OSA--patient recently  had a sleep study in [facility], he is refusing CPAP -CT chest from 12/19/2018 suggest dilated main pulmonary artery which can be seen in patients with elevated pulmonary artery pressures--clinically suspect patient has pulmonary hypertension in the setting of untreated OSA  Disposition/Need for in-Hospital Stay- patient unable to be discharged at this time due to significant hypoxia, dyspnea on exertion and orthopnea and volume overload requiring oxygen via nasal cannula and IV diuresis  Code Status : Full  Family Communication:    (patient is alert, awake and coherent) Discussed with wife multiple times during this admission -Discussed with wife again  Disposition Plan  : Home  Consults  :  Cardiology/Radiology  DVT Prophylaxis  :  Lovenox  - SCDs   Lab Results  Component Value Date   PLT 180 12/19/2018    Inpatient Medications  Scheduled Meds: . albuterol  2.5 mg Nebulization TID  . aspirin EC  81 mg Oral Daily  . atorvastatin  80 mg Oral q1800  . carvedilol  12.5 mg Oral BID  . clopidogrel  75 mg Oral Daily  . enoxaparin (LOVENOX) injection  60 mg Subcutaneous Q24H  . furosemide  80 mg Intravenous BID  . glipiZIDE  10 mg Oral BID AC  . insulin aspart  0-15 Units Subcutaneous TID WC  . insulin aspart  0-5 Units Subcutaneous QHS  . isosorbide mononitrate  30 mg Oral Daily  . metFORMIN  1,000 mg Oral BID WC  . metolazone  2.5 mg Oral BID  . nicotine  14 mg Transdermal Daily  . pantoprazole  40 mg Oral Daily  . potassium chloride  20 mEq Oral BID  . sodium chloride flush  3 mL Intravenous Q12H   Continuous Infusions: . sodium chloride    . azithromycin    . cefTRIAXone (ROCEPHIN)  IV     PRN Meds:.sodium chloride, acetaminophen, alum & mag hydroxide-simeth, ondansetron (ZOFRAN) IV, sodium chloride flush    Anti-infectives (From admission, onward)   Start     Dose/Rate Route Frequency Ordered Stop   12/19/18 1730  cefTRIAXone (ROCEPHIN) 2 g in sodium  chloride 0.9 % 100 mL IVPB     2 g 200 mL/hr over 30 Minutes Intravenous Every 24 hours 12/19/18 1719     12/19/18 1730  azithromycin (ZITHROMAX) 500 mg in sodium chloride 0.9 % 250 mL IVPB     500 mg 250 mL/hr over 60 Minutes Intravenous Every 24 hours 12/19/18 1719          Objective:   Vitals:   12/19/18 0553 12/19/18 0726 12/19/18 1313 12/19/18 1421  BP: 114/67   111/71  Pulse: 97   92  Resp: 18   16  Temp: 98.1 F (36.7 C)     TempSrc: Oral     SpO2: 90% (!) 89% (!) 89% 92%  Weight:      Height:        Wt Readings from Last 3 Encounters:  12/19/18 125.1 kg  12/14/18 (!) 137.5 kg  11/08/18 134.7 kg     Intake/Output Summary (Last 24 hours) at 12/19/2018 1720 Last data filed at 12/18/2018 1811 Gross per 24 hour  Intake 120 ml  Output -  Net 120 ml    Physical Exam  Gen:- Awake Alert, morbidly obese, dyspnea on minimal  exertion  HEENT:- [place].AT, No sclera icterus Nose -[place] 4  L/min Neck-Supple Neck, +ve JVD  Lungs-diminished in bases, Rt > Lt with bibasilar rales  CV- S1, S2 normal, regular  Abd-  +ve B.Sounds, Abd Soft, No tenderness, increased truncal adiposity    Extremity/Skin:+2 edema, TEDs on, pedal pulses present  Psych-affect is appropriate, oriented x3 Neuro-Generalized weakness, no new focal deficits, no tremors   Data Review:   Micro Results Recent Results (from the past 240 hour(s))  SARS Coronavirus 2 [facility] order, Performed in [facility] hospital lab) Nasopharyngeal Nasopharyngeal Swab     Status: None   Collection Time: 12/14/18  5:56 PM   Specimen: Nasopharyngeal Swab  Result Value Ref Range Status   SARS Coronavirus 2 NEGATIVE NEGATIVE Final    Comment: (NOTE) If result is NEGATIVE SARS-CoV-2 target nucleic acids are NOT DETECTED. The SARS-CoV-2 RNA is generally detectable in upper and lower  respiratory specimens during the acute phase of infection. The lowest  concentration of SARS-CoV-2 viral copies this assay can detect is 250   copies / mL. A negative result does not preclude SARS-CoV-2 infection  and should not be used as the sole basis for treatment or other  patient management decisions.  A negative result may occur with  improper specimen collection / handling, submission of specimen other  than nasopharyngeal swab, presence of viral mutation(s) within the  areas targeted by this assay, and inadequate number of viral copies  (<250 copies / mL). A negative result must be combined with clinical  observations, patient history, and epidemiological information. If result is POSITIVE SARS-CoV-2 target nucleic acids are DETECTED. The SARS-CoV-2 RNA is generally detectable in upper and lower  respiratory specimens dur ing the acute phase of infection.  Positive  results are indicative of active infection with SARS-CoV-2.  Clinical  correlation with patient history and other diagnostic information is  necessary to determine patient infection status.  Positive results do  not rule out bacterial infection or co-infection with other viruses. If result is PRESUMPTIVE POSTIVE SARS-CoV-2 nucleic acids MAY BE PRESENT.   A presumptive positive result was obtained on the submitted specimen  and confirmed on repeat testing.  While 2019 novel coronavirus  (SARS-CoV-2) nucleic acids may be present in the submitted sample  additional confirmatory testing may be necessary for epidemiological  and / or clinical management purposes  to differentiate between  SARS-CoV-2 and other Sarbecovirus currently known to infect humans.  If clinically indicated additional testing with an alternate test  methodology [phone]) is advised. The SARS-CoV-2 RNA is generally  detectable in upper and lower respiratory sp ecimens during the acute  phase of infection. The expected result is Negative. Fact Sheet for Patients:  StrictlyIdeas.no Fact Sheet for Healthcare Providers: BankingDealers.co.za  This test is not yet approved or cleared by the [place] FDA and has been authorized for detection and/or diagnosis of SARS-CoV-2 by FDA under an Emergency Use Authorization (EUA).  This EUA will remain in effect (meaning this test can be used) for the duration of the COVID-19 declaration under Section 564(b)(1) of the Act, 21 U.S.C. section 360bbb-3(b)(1), unless the authorization is terminated or revoked sooner. Performed at [facility], [address] [place], [place] [address]     Radiology Reports Dg Chest 2 View  Result Date: 12/19/2018 CLINICAL DATA:  63 year old male with history of dyspnea. EXAM: CHEST - 2 VIEW COMPARISON:  Chest x-ray 12/16/2018. FINDINGS: Moderate right pleural effusion. Atelectasis and/or consolidation in the right lung base.  Probable subsegmental atelectasis in the left base. No evidence of pulmonary edema. Heart size is normal. Upper mediastinal contours are within normal limits. Aortic atherosclerosis. IMPRESSION: 1. Moderate right-sided pleural effusion with increasing complexity compared to the prior examination. If there has been interval thoracentesis, this could reflect some internal pleural gas. Alternatively, this could simply reflect a decreasing right pleural effusion with some improving aeration in the right middle and lower lobes. Clinical correlation is recommended. If there is clinical concern for empyema or other complexity in the right pleural fluid or right lung, further evaluation with contrast enhanced chest CT could be considered. 2. Aortic atherosclerosis. Electronically Signed   By: Vinnie Langton M.D.   On: 12/19/2018 10:07   Dg Chest 2 View  Result Date: 12/16/2018 CLINICAL DATA:  Shortness of breath EXAM: CHEST - 2 VIEW COMPARISON:  12/14/2018 FINDINGS: Cardiomegaly. Right effusion with atelectasis of the right lower lung. Patchy atelectasis in the left lower lung. Upper lungs are clear. IMPRESSION: Large right effusion with right  lower lung volume loss. Patchy atelectasis at the left base. Electronically Signed   By: Nelson Chimes M.D.   On: 12/16/2018 11:41   Ct Chest Wo Contrast  Result Date: 12/19/2018 CLINICAL DATA:  Dyspnea EXAM: CT CHEST WITHOUT CONTRAST TECHNIQUE: Multidetector CT imaging of the chest was performed following the standard protocol without IV contrast. COMPARISON:  None. FINDINGS: Cardiovascular: The main pulmonary artery is dilated measuring approximately 4 cm in diameter. There are advanced coronary artery calcifications. The heart size is relatively normal. There is no significant pericardial effusion. There are mild atherosclerotic changes of the thoracic aorta. Mediastinum/Nodes: --there prominent but subcentimeter mediastinal and hilar lymph nodes. --No axillary lymphadenopathy. --No supraclavicular lymphadenopathy. --there is a 1.8 cm right-sided thyroid nodule. --The esophagus is unremarkable Lungs/Pleura: There is a small to moderate size possibly with loculated right-sided pleural effusion. There is airspace consolidation involving the right middle lobe and right lower lobe. There is some debris within the trachea. There is no convincing endobronchial lesion. There is a mild amount atelectasis at the left lung base. There is no pneumothorax. There is a relatively hyperdense 5 mm pulmonary nodule in the right upper lobe (axial series 4, image 66). There is a 4 mm pulmonary nodule in the right middle lobe (axial series 4, image 93). Upper Abdomen: There is hepatic steatosis. Musculoskeletal: No chest wall abnormality. No acute or significant osseous findings. Review of the MIP images confirms the above findings. IMPRESSION: 1. Small to moderate size right-sided pleural effusion that appears to be at least partially loculated. 2. Airspace consolidation involving the right middle lobe and right lower lobe concerning for multifocal pneumonia. Follow-up to resolution is recommended as an underlying mass cannot be  excluded. 3. There are few scattered 4 mm pulmonary nodules as detailed above. No follow-up needed if patient is low-risk (and has no known or suspected primary neoplasm). Non-contrast chest CT can be considered in 12 months if patient is high-risk. This recommendation follows the consensus statement: Guidelines for Management of Incidental Pulmonary Nodules Detected on CT Images: From the Fleischner Society 2017; Radiology 2017; 284:228-243. 4. Right-sided thyroid nodule as detailed above. This can be further evaluated with ultrasound as clinically indicated. 5. Hepatic steatosis. 6. Advanced coronary artery calcifications are noted. 7. Dilated main pulmonary artery which can be seen in patients with elevated pulmonary artery pressures. Aortic Atherosclerosis (ICD10-I70.0). Electronically Signed   By: Constance Holster M.D.   On: 12/19/2018 17:04   [place] Chest (pleural Effusion)  Result Date: 12/17/2018 CLINICAL DATA:  CHF, RIGHT pleural effusion, shortness of breath EXAM: CHEST ULTRASOUND COMPARISON:  Chest radiograph 12/16/2018 FINDINGS: Sonography demonstrates a small pleural effusion at the RIGHT lung base. Amount of pleural effusion is less than anticipated from the chest radiograph, which is due to elevation of the diaphragm sonographically. Thoracentesis not performed at this time. IMPRESSION: Small RIGHT pleural effusion. Electronically Signed   By: Lavonia Dana M.D.   On: 12/17/2018 10:31   Dg Chest Portable 1 View  Result Date: 12/14/2018 CLINICAL DATA:  Shortness of breath for the past month. 30 pound weight gain. EXAM: PORTABLE CHEST 1 VIEW COMPARISON:  Chest x-ray dated March 29, 2018. FINDINGS: The heart is borderline enlarged size. Mild pulmonary vascular congestion. Small right pleural effusion with adjacent right basilar atelectasis. No pneumothorax. No acute osseous abnormality. IMPRESSION: 1. Small right pleural effusion with adjacent right basilar atelectasis. Electronically Signed   By:  Titus Dubin M.D.   On: 12/14/2018 18:28    CBC Recent Labs  Lab 12/14/18 1736 12/19/18 0438  WBC 7.7 8.7  HGB 17.5* 18.7*  HCT 59.3* 61.9*  PLT 178 180  MCV 93.5 90.9  MCH 27.6 27.5  MCHC 29.5* 30.2  RDW 15.9* 15.1    Chemistries  Recent Labs  Lab 12/14/18 1736 12/15/18 1359 12/16/18 0623 12/17/18 0508 12/18/18 0456 12/19/18 0438  NA 144 140 137 141 140 138  K 4.1 4.2 4.5 4.1 3.9 3.3*  CL 101 94* 87* 80* 78* 77*  CO2 31 34* 41* 45* 44* 50*  GLUCOSE 96 278* 130* 149* 179* 200*  BUN 28* 29* 27* 27* 39* 44*  CREATININE 0.82 0.86 0.77 0.87 0.83 0.89  CALCIUM 8.6* 8.6* 8.7* 9.3 9.5 9.2  AST 26  --   --   --   --   --   ALT 46*  --   --   --   --   --   ALKPHOS 121  --   --   --   --   --   BILITOT 1.0  --   --   --   --   --    ---------------------------------------------------------------------------------------------------------------- No results for input(s): CHOL, HDL, LDLCALC, TRIG, CHOLHDL, LDLDIRECT in the last 72 hours.  Lab Results  Component Value Date   HGBA1C 8.2 (H) 12/14/2018   ------------------------------------------------------------------------------------------------------------------ No results for input(s): TSH, T4TOTAL, T3FREE, THYROIDAB in the last 72 hours.  Invalid input(s): FREET3 ------------------------------------------------------------------------------------------------------------------ No results for input(s): VITAMINB12, FOLATE, FERRITIN, TIBC, IRON, RETICCTPCT in the last 72 hours.  Coagulation profile No results for input(s): INR, PROTIME in the last 168 hours.  No results for input(s): DDIMER in the last 72 hours.  Cardiac Enzymes No results for input(s): CKMB, TROPONINI, MYOGLOBIN in the last 168 hours.  Invalid input(s): CK ------------------------------------------------------------------------------------------------------------------    Component Value Date/Time   BNP 78.0 12/14/2018 1815   Flois Mctague  M.D on 12/19/2018 at 5:20 PM  Go to [email or web address] - for contact info  [facility] - Office  [phone]

## 2018-12-19 NOTE — Progress Notes (Signed)
PT CONTINUES TO REFUSE A NUMBER OF HIS SCHEDULED MEDICATIONS & SLIDING SCALE INSULIN COVERAGE, O2@4L  VIA [place], SLIGHTLY SOB WITH EXERTION, MEDICATED FOR C/O NECK PAIN, 2+ BLE EDEMA.

## 2018-12-20 ENCOUNTER — Inpatient Hospital Stay (HOSPITAL_COMMUNITY): Payer: Medicare HMO

## 2018-12-20 LAB — BASIC METABOLIC PANEL
Anion gap: 17 — ABNORMAL HIGH (ref 5–15)
BUN: 51 mg/dL — ABNORMAL HIGH (ref 8–23)
CO2: 45 mmol/L — ABNORMAL HIGH (ref 22–32)
Calcium: 9.1 mg/dL (ref 8.9–10.3)
Chloride: 76 mmol/L — ABNORMAL LOW (ref 98–111)
Creatinine, Ser: 0.91 mg/dL (ref 0.61–1.24)
GFR calc Af Amer: >60 mL/min (ref >60)
GFR calc non Af Amer: >60 mL/min (ref >60)
Glucose, Bld: 183 mg/dL — ABNORMAL HIGH (ref 70–99)
Potassium: 3.2 mmol/L — ABNORMAL LOW (ref 3.5–5.1)
Sodium: 138 mmol/L (ref 135–145)

## 2018-12-20 LAB — MAGNESIUM: Magnesium: 2.7 mg/dL — ABNORMAL HIGH (ref 1.7–2.4)

## 2018-12-20 LAB — GLUCOSE, CAPILLARY
Glucose-Capillary: 176 mg/dL — ABNORMAL HIGH (ref 70–99)
Glucose-Capillary: 269 mg/dL — ABNORMAL HIGH (ref 70–99)
Glucose-Capillary: 190 mg/dL — ABNORMAL HIGH (ref 70–99)
Glucose-Capillary: 267 mg/dL — ABNORMAL HIGH (ref 70–99)

## 2018-12-20 MED ORDER — LORAZEPAM 2 MG/ML IJ SOLN
INTRAMUSCULAR | Status: AC
  Filled 2018-12-20: qty 1

## 2018-12-20 MED ORDER — LORAZEPAM 2 MG/ML IJ SOLN
1.0000 mg | Freq: Once | INTRAMUSCULAR | Status: AC
  Administered 2018-12-20: 1 mg via INTRAVENOUS
  Filled 2018-12-20: qty 1

## 2018-12-20 MED ORDER — CLOPIDOGREL BISULFATE 75 MG PO TABS
75.0000 mg | ORAL_TABLET | Freq: Every day | ORAL | Status: DC
  Administered 2018-12-21 – 2018-12-22 (×2): 75 mg via ORAL
  Filled 2018-12-20 (×2): qty 1

## 2018-12-20 MED ORDER — POTASSIUM CHLORIDE CRYS ER 20 MEQ PO TBCR
40.0000 meq | EXTENDED_RELEASE_TABLET | ORAL | Status: AC
  Administered 2018-12-20: 40 meq via ORAL
  Filled 2018-12-20: qty 2

## 2018-12-20 MED ORDER — ENOXAPARIN SODIUM 60 MG/0.6ML ~~LOC~~ SOLN
60.0000 mg | SUBCUTANEOUS | Status: DC
  Administered 2018-12-21: 60 mg via SUBCUTANEOUS
  Filled 2018-12-20: qty 0.6

## 2018-12-20 NOTE — Progress Notes (Signed)
CPAP pulled out of room.

## 2018-12-20 NOTE — Progress Notes (Signed)
Patient Demographics:    Thomas [name], is a 63 y.o. male, DOB - 02/15/1956, UTM:[id number]  Admit date - 12/14/2018   Admitting Physician Truett Mainland, DO  Outpatient Primary MD for the patient is Thomas Blitz, MD  LOS - 6   Chief Complaint  Patient presents with  . Shortness of Breath        Subjective:    Thomas [name] today has no fevers, no emesis,  No chest pain,   -Shortness of breath and dyspnea on exertion and hypoxia persist,  -Transfer from [facility] to [facility] for interventional radiology and possibly CT surgery consult due to persistent complicated and loculated right-sided pleural effusion   Assessment  & Plan :    Principal Problem:   Acute respiratory failure with hypoxia and hypercapnia (HCC) Active Problems:   Acute on chronic systolic CHF (congestive heart failure) (HCC)   DM (diabetes mellitus) ([place])   Morbid obesity (HCC)   Ischemic cardiomyopathy   Tobacco abuse   Loculated pleural effusion on right   Right-sided multifocal PNA (pneumonia)  Brief Summary 63 y.o. male with a history of coronary artery disease with NSTEMI in December 2019, ischemic cardiomyopathy with EF of 45% on last echocardiogram 11/08/2018, diabetes, tobacco abuse and morbid obesity admitted 12/14/2018 as a direct admission from cardiology office for acute on chronic combined CHF exacerbation with about 30 pound weight gain --Transfer from [employer] to [facility] for interventional radiology and possibly CT surgery consult due to persistent complicated and loculated right-sided pleural effusion ----  -Transfer from [facility] to [name] for IR consult and possible CT surgery  A/p 1)HFrEF--- admitted with  Acute on chronic combined heart failure exacerbation with evidence of fluid overload and hypoxic respiratory failure.   -Last known EF 45% based on echo from 11/08/2018 - hypoxia, dyspnea on  exertion persist, orthopnea is improved -Weight down to 275 pounds from 303lb -Fluid balance remains negative -Continue IV Lasix further to 80 mg every 12 hours, continue metolazone -Daily weight and fluid input and output monitoring -Monitor electrolytes and renal function with IV diuresis  2)Acute hypoxic Respiratory Failure-suspect due to CHF exacerbation--CT chest suggest possible pneumonia/complicated right-sided pleural effusion,  hypoxia, dyspnea on exertion and orthopnea but states,, currently requiring 4 to 5L of oxygen via nasal cannula down from 7 L, continue diuresis and bronchodilators (smoker)  3)Possible right-sided pneumonia--CT chest from 12/19/2018 noted, with possible multifocal pneumonia and loculated right-sided pleural effusion -No fevers or leukocytosis -Treat empirically with IV Rocephin/azithromycin pending right-sided diagnostic and therapeutic image-guided thoracentesis  with fluid studies =-Continue submental oxygen at bronchodilators as ordered  4)Complicated and Loculated Rt Pleural Effusion- CT chest without contrast from 12/19/2018 noted with possible loculated right-sided pleural effusion and possible multifocal Pneumonia -Follow-up ultrasound on 08/21/8125 confirms complicated Rt Pleural effusion --Patient needs right-sided diagnostic and therapeutic  Image- guided thoracentesis  with fluid studies Discussed with Dr. Ronny Bacon (IR) to evaluate for possible diagnostic and therapeutic right-sided image guided thoracentesis versus chest tube placement -Patient may need CT surgery consult -Transfer from [name] to [name]  5)DM2--A1c 8.2, renal function appears okay, c/n metformin, hold Invokana for now, use Novolog/Humalog Sliding scale insulin with Accu-Cheks/Fingersticks as ordered -Patient refusing subcu insulin,  added glipizide instead  6)Tobacco Abuse--smoking cessation advised, continue nicotine patch  7)CAD- predominantly branch vessel with severe  stenosis of the second obtuse marginal that was managed medically as of cardiac catheterization in December 2019.  No major obstructive disease within the larger epicardials. -continue Coreg, isosorbide continue aspirin , Lipitor and Plavix  8)Social/Ethics-- --Patient refusing lab work, from time to time refusing medications from time to time, --Patient tries to pick and choose which part of the medical therapy he will comply with --Pt is a full code  9)Morbid Obesity/OSA--patient recently had a sleep study in [name], he is refusing CPAP here  -CT chest from 12/19/2018 suggest dilated main pulmonary artery which can be seen in patients with elevated pulmonary artery pressures--clinically suspect patient has pulmonary hypertension in the setting of untreated OSA  Disposition/Need for in-[name] Stay- patient unable to be discharged at this time due to significant hypoxia, dyspnea on exertion and orthopnea and volume overload requiring oxygen via nasal cannula and IV diuresis -Awaiting evaluation by interventional radiology and possible CT surgery due to right-sided complicated pleural effusion  Code Status : Full  Family Communication:    (patient is alert, awake and coherent) Discussed with wife multiple times during this admission -Discussed with wife again today-- wife aware of Transfer to [facility]  Disposition Plan  : -Transfer from [place] to [name] for IR consult and possible CT surgery  Consults  :  Cardiology/Radiology/interventional radiology  DVT Prophylaxis  :  Lovenox  - SCDs   Lab Results  Component Value Date   PLT 180 12/19/2018   Inpatient Medications  Scheduled Meds: . albuterol  2.5 mg Nebulization TID  . aspirin EC  81 mg Oral Daily  . atorvastatin  80 mg Oral q1800  . carvedilol  12.5 mg Oral BID  . [START ON 12/21/2018] clopidogrel  75 mg Oral Daily  . [START ON 12/21/2018] enoxaparin (LOVENOX) injection  60 mg Subcutaneous Q24H  . furosemide  80 mg  Intravenous BID  . glipiZIDE  10 mg Oral BID AC  . guaiFENesin  600 mg Oral BID  . insulin aspart  0-15 Units Subcutaneous TID WC  . insulin aspart  0-5 Units Subcutaneous QHS  . isosorbide mononitrate  30 mg Oral Daily  . metFORMIN  1,000 mg Oral BID WC  . metolazone  2.5 mg Oral BID  . nicotine  14 mg Transdermal Daily  . pantoprazole  40 mg Oral Daily  . potassium chloride  20 mEq Oral BID  . potassium chloride  40 mEq Oral Q3H  . sodium chloride flush  3 mL Intravenous Q12H   Continuous Infusions: . sodium chloride    . azithromycin 500 mg (12/19/18 1835)  . cefTRIAXone (ROCEPHIN)  IV     PRN Meds:.sodium chloride, acetaminophen, alum & mag hydroxide-simeth, ondansetron (ZOFRAN) IV, sodium chloride flush  Anti-infectives (From admission, onward)   Start     Dose/Rate Route Frequency Ordered Stop   12/19/18 1730  cefTRIAXone (ROCEPHIN) 2 g in sodium chloride 0.9 % 100 mL IVPB     2 g 200 mL/hr over 30 Minutes Intravenous Every 24 hours 12/19/18 1719     12/19/18 1730  azithromycin (ZITHROMAX) 500 mg in sodium chloride 0.9 % 250 mL IVPB     500 mg 250 mL/hr over 60 Minutes Intravenous Every 24 hours 12/19/18 1719         Objective:   Vitals:   12/19/18 1421 12/19/18 1949 12/20/18 0552 12/20/18 6063  BP: 111/71  132/79   Pulse: 92  88   Resp: 16  20   Temp:   98.2 F (36.8 C)   TempSrc:   Oral   SpO2: 92% 91% 91% (!) 88%  Weight:      Height:       Temp:  [98.2 F (36.8 C)] 98.2 F (36.8 C) (09/03 0552) Pulse Rate:  [88] 88 (09/03 0552) Resp:  [20] 20 (09/03 0552) BP: (132)/(79) 132/79 (09/03 0552) SpO2:  [88 %-91 %] 88 % (09/03 0821)   Wt Readings from Last 3 Encounters:  12/19/18 125.1 kg  12/14/18 (!) 137.5 kg  11/08/18 134.7 kg    Intake/Output Summary (Last 24 hours) at 12/20/2018 1422 Last data filed at 12/20/2018 0900 Gross per 24 hour  Intake 490 ml  Output -  Net 490 ml   Physical Exam Gen:- Awake Alert, morbidly obese, dyspnea on minimal  exertion  HEENT:- [place].AT, No sclera icterus Nose -[place] 4  L/min Neck-Supple Neck, +ve JVD  Lungs-diminished in bases, Rt > Lt with bibasilar rales  CV- S1, S2 normal, regular  Abd-  +ve B.Sounds, Abd Soft, No tenderness, increased truncal adiposity    Extremity/Skin:+2 edema, TEDs on, pedal pulses present  Psych-affect is appropriate, oriented x3 Neuro-Generalized weakness, no new focal deficits, no tremors   Data Review:   Micro Results Recent Results (from the past 240 hour(s))  SARS Coronavirus 2 [facility] order, Performed in [name] lab) Nasopharyngeal Nasopharyngeal Swab     Status: None   Collection Time: 12/14/18  5:56 PM   Specimen: Nasopharyngeal Swab  Result Value Ref Range Status   SARS Coronavirus 2 NEGATIVE NEGATIVE Final    Comment: (NOTE) If result is NEGATIVE SARS-CoV-2 target nucleic acids are NOT DETECTED. The SARS-CoV-2 RNA is generally detectable in upper and lower  respiratory specimens during the acute phase of infection. The lowest  concentration of SARS-CoV-2 viral copies this assay can detect is 250  copies / mL. A negative result does not preclude SARS-CoV-2 infection  and should not be used as the sole basis for treatment or other  patient management decisions.  A negative result may occur with  improper specimen collection / handling, submission of specimen other  than nasopharyngeal swab, presence of viral mutation(s) within the  areas targeted by this assay, and inadequate number of viral copies  (<250 copies / mL). A negative result must be combined with clinical  observations, patient history, and epidemiological information. If result is POSITIVE SARS-CoV-2 target nucleic acids are DETECTED. The SARS-CoV-2 RNA is generally detectable in upper and lower  respiratory specimens dur ing the acute phase of infection.  Positive  results are indicative of active infection with SARS-CoV-2.  Clinical  correlation with patient history and other  diagnostic information is  necessary to determine patient infection status.  Positive results do  not rule out bacterial infection or co-infection with other viruses. If result is PRESUMPTIVE POSTIVE SARS-CoV-2 nucleic acids MAY BE PRESENT.   A presumptive positive result was obtained on the submitted specimen  and confirmed on repeat testing.  While 2019 novel coronavirus  (SARS-CoV-2) nucleic acids may be present in the submitted sample  additional confirmatory testing may be necessary for epidemiological  and / or clinical management purposes  to differentiate between  SARS-CoV-2 and other Sarbecovirus currently known to infect humans.  If clinically indicated additional testing with an alternate test  methodology [phone]) is advised. The SARS-CoV-2 RNA is generally  detectable  in upper and lower respiratory sp ecimens during the acute  phase of infection. The expected result is Negative. Fact Sheet for Patients:  StrictlyIdeas.no Fact Sheet for Healthcare Providers: BankingDealers.co.za This test is not yet approved or cleared by the [place] FDA and has been authorized for detection and/or diagnosis of SARS-CoV-2 by FDA under an Emergency Use Authorization (EUA).  This EUA will remain in effect (meaning this test can be used) for the duration of the COVID-19 declaration under Section 564(b)(1) of the Act, 21 U.S.C. section 360bbb-3(b)(1), unless the authorization is terminated or revoked sooner. Performed at [facility], [address] [place], [place] [address]     Radiology Reports Dg Chest 2 View  Result Date: 12/19/2018 CLINICAL DATA:  63 year old male with history of dyspnea. EXAM: CHEST - 2 VIEW COMPARISON:  Chest x-ray 12/16/2018. FINDINGS: Moderate right pleural effusion. Atelectasis and/or consolidation in the right lung base. Probable subsegmental atelectasis in the left base. No evidence of pulmonary edema. Heart  size is normal. Upper mediastinal contours are within normal limits. Aortic atherosclerosis. IMPRESSION: 1. Moderate right-sided pleural effusion with increasing complexity compared to the prior examination. If there has been interval thoracentesis, this could reflect some internal pleural gas. Alternatively, this could simply reflect a decreasing right pleural effusion with some improving aeration in the right middle and lower lobes. Clinical correlation is recommended. If there is clinical concern for empyema or other complexity in the right pleural fluid or right lung, further evaluation with contrast enhanced chest CT could be considered. 2. Aortic atherosclerosis. Electronically Signed   By: Vinnie Langton M.D.   On: 12/19/2018 10:07   Dg Chest 2 View  Result Date: 12/16/2018 CLINICAL DATA:  Shortness of breath EXAM: CHEST - 2 VIEW COMPARISON:  12/14/2018 FINDINGS: Cardiomegaly. Right effusion with atelectasis of the right lower lung. Patchy atelectasis in the left lower lung. Upper lungs are clear. IMPRESSION: Large right effusion with right lower lung volume loss. Patchy atelectasis at the left base. Electronically Signed   By: Nelson Chimes M.D.   On: 12/16/2018 11:41   Ct Chest Wo Contrast  Result Date: 12/19/2018 CLINICAL DATA:  Dyspnea EXAM: CT CHEST WITHOUT CONTRAST TECHNIQUE: Multidetector CT imaging of the chest was performed following the standard protocol without IV contrast. COMPARISON:  None. FINDINGS: Cardiovascular: The main pulmonary artery is dilated measuring approximately 4 cm in diameter. There are advanced coronary artery calcifications. The heart size is relatively normal. There is no significant pericardial effusion. There are mild atherosclerotic changes of the thoracic aorta. Mediastinum/Nodes: --there prominent but subcentimeter mediastinal and hilar lymph nodes. --No axillary lymphadenopathy. --No supraclavicular lymphadenopathy. --there is a 1.8 cm right-sided thyroid nodule.  --The esophagus is unremarkable Lungs/Pleura: There is a small to moderate size possibly with loculated right-sided pleural effusion. There is airspace consolidation involving the right middle lobe and right lower lobe. There is some debris within the trachea. There is no convincing endobronchial lesion. There is a mild amount atelectasis at the left lung base. There is no pneumothorax. There is a relatively hyperdense 5 mm pulmonary nodule in the right upper lobe (axial series 4, image 66). There is a 4 mm pulmonary nodule in the right middle lobe (axial series 4, image 93). Upper Abdomen: There is hepatic steatosis. Musculoskeletal: No chest wall abnormality. No acute or significant osseous findings. Review of the MIP images confirms the above findings. IMPRESSION: 1. Small to moderate size right-sided pleural effusion that appears to be at least partially loculated. 2. Airspace  consolidation involving the right middle lobe and right lower lobe concerning for multifocal pneumonia. Follow-up to resolution is recommended as an underlying mass cannot be excluded. 3. There are few scattered 4 mm pulmonary nodules as detailed above. No follow-up needed if patient is low-risk (and has no known or suspected primary neoplasm). Non-contrast chest CT can be considered in 12 months if patient is high-risk. This recommendation follows the consensus statement: Guidelines for Management of Incidental Pulmonary Nodules Detected on CT Images: From the Fleischner Society 2017; Radiology 2017; 284:228-243. 4. Right-sided thyroid nodule as detailed above. This can be further evaluated with ultrasound as clinically indicated. 5. Hepatic steatosis. 6. Advanced coronary artery calcifications are noted. 7. Dilated main pulmonary artery which can be seen in patients with elevated pulmonary artery pressures. Aortic Atherosclerosis (ICD10-I70.0). Electronically Signed   By: Constance Holster M.D.   On: 12/19/2018 17:04   [place] Chest  (pleural Effusion)  Result Date: 12/20/2018 CLINICAL DATA:  RIGHT pleural effusion, for thoracentesis EXAM: CHEST ULTRASOUND COMPARISON:  CT chest 12/19/2018 FINDINGS: Sonography of the inferior RIGHT hemithorax was performed in anticipation of thoracentesis. Suboptimal visualization of pleural effusion due to body habitus. Pleural effusion appears to be partially loculated at the RIGHT lung base and small in size. Scattered internal echogenicity is seen. Inadequate pocket for thoracentesis was not visualized. IMPRESSION: Complicated small RIGHT pleural effusion. Thoracentesis not performed. Electronically Signed   By: Lavonia Dana M.D.   On: 12/20/2018 12:37   [place] Chest (pleural Effusion)  Result Date: 12/17/2018 CLINICAL DATA:  CHF, RIGHT pleural effusion, shortness of breath EXAM: CHEST ULTRASOUND COMPARISON:  Chest radiograph 12/16/2018 FINDINGS: Sonography demonstrates a small pleural effusion at the RIGHT lung base. Amount of pleural effusion is less than anticipated from the chest radiograph, which is due to elevation of the diaphragm sonographically. Thoracentesis not performed at this time. IMPRESSION: Small RIGHT pleural effusion. Electronically Signed   By: Lavonia Dana M.D.   On: 12/17/2018 10:31   Dg Chest Portable 1 View  Result Date: 12/14/2018 CLINICAL DATA:  Shortness of breath for the past month. 30 pound weight gain. EXAM: PORTABLE CHEST 1 VIEW COMPARISON:  Chest x-ray dated March 29, 2018. FINDINGS: The heart is borderline enlarged size. Mild pulmonary vascular congestion. Small right pleural effusion with adjacent right basilar atelectasis. No pneumothorax. No acute osseous abnormality. IMPRESSION: 1. Small right pleural effusion with adjacent right basilar atelectasis. Electronically Signed   By: Titus Dubin M.D.   On: 12/14/2018 18:28    CBC Recent Labs  Lab 12/14/18 1736 12/19/18 0438  WBC 7.7 8.7  HGB 17.5* 18.7*  HCT 59.3* 61.9*  PLT 178 180  MCV 93.5 90.9  MCH 27.6  27.5  MCHC 29.5* 30.2  RDW 15.9* 15.1    Chemistries  Recent Labs  Lab 12/14/18 1736  12/16/18 0623 12/17/18 0508 12/18/18 0456 12/19/18 0438 12/20/18 0519  NA 144   < > 137 141 140 138 138  K 4.1   < > 4.5 4.1 3.9 3.3* 3.2*  CL 101   < > 87* 80* 78* 77* 76*  CO2 31   < > 41* 45* 44* 50* 45*  GLUCOSE 96   < > 130* 149* 179* 200* 183*  BUN 28*   < > 27* 27* 39* 44* 51*  CREATININE 0.82   < > 0.77 0.87 0.83 0.89 0.91  CALCIUM 8.6*   < > 8.7* 9.3 9.5 9.2 9.1  MG  --   --   --   --   --   --  2.7*  AST 26  --   --   --   --   --   --   ALT 46*  --   --   --   --   --   --   ALKPHOS 121  --   --   --   --   --   --   BILITOT 1.0  --   --   --   --   --   --    < > = values in this interval not displayed.   ---------------------------------------------------------------------------------------------------------------- No results for input(s): CHOL, HDL, LDLCALC, TRIG, CHOLHDL, LDLDIRECT in the last 72 hours.  Lab Results  Component Value Date   HGBA1C 8.2 (H) 12/14/2018   ------------------------------------------------------------------------------------------------------------------ No results for input(s): TSH, T4TOTAL, T3FREE, THYROIDAB in the last 72 hours.  Invalid input(s): FREET3 ------------------------------------------------------------------------------------------------------------------ No results for input(s): VITAMINB12, FOLATE, FERRITIN, TIBC, IRON, RETICCTPCT in the last 72 hours.  Coagulation profile No results for input(s): INR, PROTIME in the last 168 hours.  No results for input(s): DDIMER in the last 72 hours.  Cardiac Enzymes No results for input(s): CKMB, TROPONINI, MYOGLOBIN in the last 168 hours.  Invalid input(s): CK ------------------------------------------------------------------------------------------------------------------    Component Value Date/Time   BNP 78.0 12/14/2018 1815   Kamonte Mcmichen M.D on 12/20/2018 at 2:22 PM  Go to  [email or web address] - for contact info  [facility] - Office  [phone]

## 2018-12-20 NOTE — Progress Notes (Signed)
Patient arrived to unit, ambulates in room. Cardiac monitor initiated and verified. Refusal of insulin and cpap. Will continue to monitor.

## 2018-12-21 DIAGNOSIS — J90 Pleural effusion, not elsewhere classified: Secondary | ICD-10-CM

## 2018-12-21 DIAGNOSIS — J181 Lobar pneumonia, unspecified organism: Secondary | ICD-10-CM

## 2018-12-21 LAB — BASIC METABOLIC PANEL
Anion gap: 19 — ABNORMAL HIGH (ref 5–15)
BUN: 53 mg/dL — ABNORMAL HIGH (ref 8–23)
CO2: 46 mmol/L — ABNORMAL HIGH (ref 22–32)
Calcium: 9.1 mg/dL (ref 8.9–10.3)
Chloride: 72 mmol/L — ABNORMAL LOW (ref 98–111)
Creatinine, Ser: 1.05 mg/dL (ref 0.61–1.24)
GFR calc Af Amer: >60 mL/min (ref >60)
GFR calc non Af Amer: >60 mL/min (ref >60)
Glucose, Bld: 193 mg/dL — ABNORMAL HIGH (ref 70–99)
Potassium: 2.8 mmol/L — ABNORMAL LOW (ref 3.5–5.1)
Sodium: 137 mmol/L (ref 135–145)

## 2018-12-21 LAB — CBC
HCT: 61.1 % — ABNORMAL HIGH (ref 39.0–52.0)
Hemoglobin: 18.6 g/dL — ABNORMAL HIGH (ref 13.0–17.0)
MCH: 27.6 pg (ref 26.0–34.0)
MCHC: 30.4 g/dL (ref 30.0–36.0)
MCV: 90.7 fL (ref 80.0–100.0)
Platelets: 173 10*3/uL (ref 150–400)
RBC: 6.74 MIL/uL — ABNORMAL HIGH (ref 4.22–5.81)
RDW: 14.4 % (ref 11.5–15.5)
WBC: 7.2 10*3/uL (ref 4.0–10.5)
nRBC: 0 % (ref 0.0–0.2)

## 2018-12-21 LAB — GLUCOSE, CAPILLARY
Glucose-Capillary: 215 mg/dL — ABNORMAL HIGH (ref 70–99)
Glucose-Capillary: 237 mg/dL — ABNORMAL HIGH (ref 70–99)
Glucose-Capillary: 226 mg/dL — ABNORMAL HIGH (ref 70–99)
Glucose-Capillary: 236 mg/dL — ABNORMAL HIGH (ref 70–99)

## 2018-12-21 LAB — SARS CORONAVIRUS 2 (TAT 6-24 HRS): SARS Coronavirus 2: NEGATIVE

## 2018-12-21 MED ORDER — SACUBITRIL-VALSARTAN 24-26 MG PO TABS: 0.5000 | ORAL_TABLET | Freq: Two times a day (BID) | ORAL | Status: DC

## 2018-12-21 MED ORDER — AZITHROMYCIN 500 MG PO TABS
500.0000 mg | ORAL_TABLET | Freq: Every day | ORAL | Status: DC
  Administered 2018-12-21: 500 mg via ORAL
  Filled 2018-12-21: qty 1

## 2018-12-21 MED ORDER — METFORMIN HCL ER 500 MG PO TB24
1000.0000 mg | ORAL_TABLET | Freq: Two times a day (BID) | ORAL | Status: DC
  Administered 2018-12-21 – 2018-12-22 (×3): 1000 mg via ORAL
  Filled 2018-12-21 (×4): qty 2

## 2018-12-21 MED ORDER — ALBUTEROL SULFATE (2.5 MG/3ML) 0.083% IN NEBU: 2.5000 mg | INHALATION_SOLUTION | RESPIRATORY_TRACT | Status: DC | PRN

## 2018-12-21 MED ORDER — FUROSEMIDE 20 MG PO TABS
20.0000 mg | ORAL_TABLET | Freq: Every day | ORAL | Status: DC
  Administered 2018-12-21 – 2018-12-22 (×2): 20 mg via ORAL
  Filled 2018-12-21 (×2): qty 1

## 2018-12-21 MED ORDER — CANAGLIFLOZIN 300 MG PO TABS
300.0000 mg | ORAL_TABLET | Freq: Every day | ORAL | Status: DC
  Administered 2018-12-22: 300 mg via ORAL
  Filled 2018-12-21: qty 1

## 2018-12-21 NOTE — Progress Notes (Signed)
Inpatient Diabetes Program Recommendations  AACE/ADA: New Consensus Statement on Inpatient Glycemic Control   Target Ranges:  Prepandial:   less than 140 mg/dL      Peak postprandial:   less than 180 mg/dL (1-2 hours)      Critically ill patients:  140 - 180 mg/dL   Results for Thomas [name], Thomas [name] (MRN [id number]) as of 12/21/2018 10:31  Ref. Range 12/20/2018 07:48 12/20/2018 11:07 12/20/2018 16:15 12/20/2018 21:33 12/21/2018 06:51  Glucose-Capillary Latest Ref Range: 70 - 99 mg/dL 176 (H) 269 (H) 190 (H) 267 (H) 215 (H)  Results for Thomas [name], Thomas [name] (MRN [id number]) as of 12/21/2018 10:31  Ref. Range 12/14/2018 17:36  Hemoglobin A1C Latest Ref Range: 4.8 - 5.6 % 8.2 (H)   Review of Glycemic Control  Diabetes history: DM2 Outpatient Diabetes medications: Invokana 300 mg QAM, Metformin XR 1000 mg BID Current orders for Inpatient glycemic control:Novolog 0-15 units TID with meals, Novolog 0-5 units QHS  Inpatient Diabetes Program Recommendations:   Insulin-Correction: Patient continues to refuse Novolog correction insulin. Diabetes Coordinator, nursing staff, and physicians have talked with patient regarding importance of glycemic control while inpatient with Novolog correction insulin but he continues to refuse.  Oral DM medication: Please consider ordering Metformin 1000 mg BID and Glipizide 10 mg BID while inpatient to improve glycemic control since patient is refusing insulin.  NOTE: Patient has continued to refuse to take insulin during admission despite MDs, RNs, and Diabetes Coordinator encouraging patient to take insulin while inpatient. Patient was ordered Metformin 1000 mg BID and started on Glipizide 10 mg BID on 12/19/18 but they were discontinued when patient was transferred to [facility]. Recommend reordering oral DM medications since patient continues to refuse insulin.  Thanks, Barnie Alderman, RN, MSN, CDE Diabetes Coordinator Inpatient Diabetes Program [phone] (Team Pager from 8am to  5pm)

## 2018-12-21 NOTE — Plan of Care (Signed)
  Problem: Education: Goal: Knowledge of General Education information will improve Description: Including pain rating scale, medication(s)/side effects and non-pharmacologic comfort measures Outcome: Progressing   Problem: Health Behavior/Discharge Planning: Goal: Ability to manage health-related needs will improve Outcome: Progressing   Problem: Clinical Measurements: Goal: Respiratory complications will improve Outcome: Progressing   

## 2018-12-21 NOTE — Progress Notes (Signed)
Patient having several runs of wide QRS, tachycardia. Patient asymptomatic. On call provider notified.

## 2018-12-21 NOTE — Care Management Important Message (Signed)
Important Message  Patient Details  Name: Thomas [name] MRN: [id number] Date of Birth: Oct 03, 1955   Medicare Important Message Given:  Yes     Memory Argue 12/21/2018, 2:51 PM

## 2018-12-21 NOTE — Progress Notes (Signed)
Patient refused CPAP.

## 2018-12-21 NOTE — Progress Notes (Addendum)
PROGRESS NOTE    Thomas [name]   PPI:[id number]  DOB: [birth year]  DOA: 12/14/2018 PCP: Monico Blitz, MD   Brief Narrative:  Thomas [name] is a 63 year old male with coronary artery disease/NSTEMI in 03/2018, ischemic cardiomyopathy with an EF of 45% on echo on 10/2018, diabetes mellitus, tobacco abuse, morbid obesity who was admitted on 12/14/18 admitted from the cardiology office with acute respiratory failure secondary to acute systolic heart failure.  He was started on IV Lasix and metolazone. He was diuresed with a noted improvement in weight from 303 to 275 pounds.  He remained hypoxic and had dyspnea and a CT scan was done on 9/2 which suggested a possible pneumonia with a small to moderate complicated right-sided pleural effusion.  He was treated with Rocephin and azithromycin. An ultrasound on 9/3 showed a small complicated pleural effusion  He was transferred to [facility] for a thoracentesis.   Subjective: Patient states that he is anxious about the thoracentesis because he hates needles.  He has not tried to ambulate and therefore does not know if he still short of breath on exertion.  He has no other complaints    Assessment & Plan:   Principal Problem:   Acute respiratory failure with hypoxia and hypercapnia -Initially suspected to be secondary to acute systolic heart failure and therefore on diuretics IV- Will transition back to oral Lasix today - Subsequently found to have a right-sided consolidation on CT scan suggestive of a right lobar pneumonia on 9/2 and therefore ceftriaxone and azithromycin were started - Also has what appears to be a small right complicated pleural effusion and therefore, he has been transferred to [facility] and IR plans for thoracentesis today-we may need to consult CT surgery for VATS - he remains on 4.5 L O2 with a pulse ox of 92%   Active Problems:    DM (diabetes mellitus) ([place]) - Hemoglobin A1c is 8.2 -He has been refusing  insulin sliding scale -He was started on glipizide and metformin- I held Glipizide today as he was NPO-  Will resume his home med, Invokana    Ischemic cardiomyopathy/ CAD - NSTEMI- HLD - Cont ASA, Plavix, Coreg, Lipitor, Imdur    Morbid obesity Body mass index is 39.88 kg/m.  Needs to lose weight     Tobacco abuse - cont Nicotine patch- counseled to stop smoking  Pulmonary nodules in a smoker Noted on CT- need to be followed closely as outpt  Time spent in minutes: 40 DVT prophylaxis: Lovenox Code Status: Full code Family Communication:  Disposition Plan: home when stable Consultants:   IR Procedures:   none Antimicrobials:  Anti-infectives (From admission, onward)   Start     Dose/Rate Route Frequency Ordered Stop   12/21/18 1800  azithromycin (ZITHROMAX) tablet 500 mg     500 mg Oral Daily-1800 12/21/18 1046     12/19/18 1730  cefTRIAXone (ROCEPHIN) 2 g in sodium chloride 0.9 % 100 mL IVPB     2 g 200 mL/hr over 30 Minutes Intravenous Every 24 hours 12/19/18 1719     12/19/18 1730  azithromycin (ZITHROMAX) 500 mg in sodium chloride 0.9 % 250 mL IVPB  Status:  Discontinued     500 mg 250 mL/hr over 60 Minutes Intravenous Every 24 hours 12/19/18 1719 12/21/18 1046       Objective: Vitals:   12/21/18 0058 12/21/18 0600 12/21/18 0921 12/21/18 1157  BP: (!) 92/45 (!) 94/49 120/72 (!) 99/46  Pulse: 89 92 99  80  Resp: 20 20 18 18   Temp: 97.6 F (36.4 C) 97.7 F (36.5 C)  98 F (36.7 C)  TempSrc: Oral Oral  Oral  SpO2: (!) 89% 92% 92% 92%  Weight:  122.5 kg    Height:        Intake/Output Summary (Last 24 hours) at 12/21/2018 1318 Last data filed at 12/21/2018 0701 Gross per 24 hour  Intake 600 ml  Output 1900 ml  Net -1300 ml   Filed Weights   12/19/18 0500 12/20/18 2055 12/21/18 0600  Weight: 125.1 kg 123.9 kg 122.5 kg    Examination: General exam: Appears comfortable- morbidly obese  HEENT: PERRLA, oral mucosa moist, no sclera icterus or  thrush Respiratory system: poor air movement in right lung base- on 4.5 L O2- Respiratory effort normal. Cardiovascular system: S1 & S2 heard, RRR.   Gastrointestinal system: Abdomen soft, non-tender, nondistended. Normal bowel sounds. Central nervous system: Alert and oriented. No focal neurological deficits. Extremities: No cyanosis, clubbing or edema Skin: No rashes or ulcers Psychiatry:  anxious    Data Reviewed: I have personally reviewed following labs and imaging studies  CBC: Recent Labs  Lab 12/14/18 1736 12/19/18 0438 12/21/18 0836  WBC 7.7 8.7 7.2  HGB 17.5* 18.7* 18.6*  HCT 59.3* 61.9* 61.1*  MCV 93.5 90.9 90.7  PLT 178 180 245   Basic Metabolic Panel: Recent Labs  Lab 12/17/18 0508 12/18/18 0456 12/19/18 0438 12/20/18 0519 12/21/18 0836  NA 141 140 138 138 137  K 4.1 3.9 3.3* 3.2* 2.8*  CL 80* 78* 77* 76* 72*  CO2 45* 44* 50* 45* 46*  GLUCOSE 149* 179* 200* 183* 193*  BUN 27* 39* 44* 51* 53*  CREATININE 0.87 0.83 0.89 0.91 1.05  CALCIUM 9.3 9.5 9.2 9.1 9.1  MG  --   --   --  2.7*  --    GFR: Estimated Creatinine Clearance: 95.5 mL/min (by C-G formula based on SCr of 1.05 mg/dL). Liver Function Tests: Recent Labs  Lab 12/14/18 1736  AST 26  ALT 46*  ALKPHOS 121  BILITOT 1.0  PROT 6.4*  ALBUMIN 3.5   No results for input(s): LIPASE, AMYLASE in the last 168 hours. No results for input(s): AMMONIA in the last 168 hours. Coagulation Profile: No results for input(s): INR, PROTIME in the last 168 hours. Cardiac Enzymes: No results for input(s): CKTOTAL, CKMB, CKMBINDEX, TROPONINI in the last 168 hours. BNP (last 3 results) No results for input(s): PROBNP in the last 8760 hours. HbA1C: No results for input(s): HGBA1C in the last 72 hours. CBG: Recent Labs  Lab 12/20/18 1107 12/20/18 1615 12/20/18 2133 12/21/18 0651 12/21/18 1153  GLUCAP 269* 190* 267* 215* 237*   Lipid Profile: No results for input(s): CHOL, HDL, LDLCALC, TRIG,  CHOLHDL, LDLDIRECT in the last 72 hours. Thyroid Function Tests: No results for input(s): TSH, T4TOTAL, FREET4, T3FREE, THYROIDAB in the last 72 hours. Anemia Panel: No results for input(s): VITAMINB12, FOLATE, FERRITIN, TIBC, IRON, RETICCTPCT in the last 72 hours. Urine analysis: No results found for: COLORURINE, APPEARANCEUR, LABSPEC, PHURINE, GLUCOSEU, HGBUR, BILIRUBINUR, KETONESUR, PROTEINUR, UROBILINOGEN, NITRITE, LEUKOCYTESUR Sepsis Labs: @LABRCNTIP (procalcitonin:4,lacticidven:4) ) Recent Results (from the past 240 hour(s))  SARS Coronavirus 2 [facility] order, Performed in [facility] hospital lab) Nasopharyngeal Nasopharyngeal Swab     Status: None   Collection Time: 12/14/18  5:56 PM   Specimen: Nasopharyngeal Swab  Result Value Ref Range Status   SARS Coronavirus 2 NEGATIVE NEGATIVE Final    Comment: (NOTE)  If result is NEGATIVE SARS-CoV-2 target nucleic acids are NOT DETECTED. The SARS-CoV-2 RNA is generally detectable in upper and lower  respiratory specimens during the acute phase of infection. The lowest  concentration of SARS-CoV-2 viral copies this assay can detect is 250  copies / mL. A negative result does not preclude SARS-CoV-2 infection  and should not be used as the sole basis for treatment or other  patient management decisions.  A negative result may occur with  improper specimen collection / handling, submission of specimen other  than nasopharyngeal swab, presence of viral mutation(s) within the  areas targeted by this assay, and inadequate number of viral copies  (<250 copies / mL). A negative result must be combined with clinical  observations, patient history, and epidemiological information. If result is POSITIVE SARS-CoV-2 target nucleic acids are DETECTED. The SARS-CoV-2 RNA is generally detectable in upper and lower  respiratory specimens dur ing the acute phase of infection.  Positive  results are indicative of active infection with SARS-CoV-2.   Clinical  correlation with patient history and other diagnostic information is  necessary to determine patient infection status.  Positive results do  not rule out bacterial infection or co-infection with other viruses. If result is PRESUMPTIVE POSTIVE SARS-CoV-2 nucleic acids MAY BE PRESENT.   A presumptive positive result was obtained on the submitted specimen  and confirmed on repeat testing.  While 2019 novel coronavirus  (SARS-CoV-2) nucleic acids may be present in the submitted sample  additional confirmatory testing may be necessary for epidemiological  and / or clinical management purposes  to differentiate between  SARS-CoV-2 and other Sarbecovirus currently known to infect humans.  If clinically indicated additional testing with an alternate test  methodology [phone]) is advised. The SARS-CoV-2 RNA is generally  detectable in upper and lower respiratory sp ecimens during the acute  phase of infection. The expected result is Negative. Fact Sheet for Patients:  StrictlyIdeas.no Fact Sheet for Healthcare Providers: BankingDealers.co.za This test is not yet approved or cleared by the [place] FDA and has been authorized for detection and/or diagnosis of SARS-CoV-2 by FDA under an Emergency Use Authorization (EUA).  This EUA will remain in effect (meaning this test can be used) for the duration of the COVID-19 declaration under Section 564(b)(1) of the Act, 21 U.S.C. section 360bbb-3(b)(1), unless the authorization is terminated or revoked sooner. Performed at [facility], [address], [place], [place] [address]          Radiology Studies: Ct Chest Wo Contrast  Result Date: 12/19/2018 CLINICAL DATA:  Dyspnea EXAM: CT CHEST WITHOUT CONTRAST TECHNIQUE: Multidetector CT imaging of the chest was performed following the standard protocol without IV contrast. COMPARISON:  None. FINDINGS: Cardiovascular: The main pulmonary  artery is dilated measuring approximately 4 cm in diameter. There are advanced coronary artery calcifications. The heart size is relatively normal. There is no significant pericardial effusion. There are mild atherosclerotic changes of the thoracic aorta. Mediastinum/Nodes: --there prominent but subcentimeter mediastinal and hilar lymph nodes. --No axillary lymphadenopathy. --No supraclavicular lymphadenopathy. --there is a 1.8 cm right-sided thyroid nodule. --The esophagus is unremarkable Lungs/Pleura: There is a small to moderate size possibly with loculated right-sided pleural effusion. There is airspace consolidation involving the right middle lobe and right lower lobe. There is some debris within the trachea. There is no convincing endobronchial lesion. There is a mild amount atelectasis at the left lung base. There is no pneumothorax. There is a relatively hyperdense 5 mm pulmonary nodule in the  right upper lobe (axial series 4, image 66). There is a 4 mm pulmonary nodule in the right middle lobe (axial series 4, image 93). Upper Abdomen: There is hepatic steatosis. Musculoskeletal: No chest wall abnormality. No acute or significant osseous findings. Review of the MIP images confirms the above findings. IMPRESSION: 1. Small to moderate size right-sided pleural effusion that appears to be at least partially loculated. 2. Airspace consolidation involving the right middle lobe and right lower lobe concerning for multifocal pneumonia. Follow-up to resolution is recommended as an underlying mass cannot be excluded. 3. There are few scattered 4 mm pulmonary nodules as detailed above. No follow-up needed if patient is low-risk (and has no known or suspected primary neoplasm). Non-contrast chest CT can be considered in 12 months if patient is high-risk. This recommendation follows the consensus statement: Guidelines for Management of Incidental Pulmonary Nodules Detected on CT Images: From the Fleischner Society 2017;  Radiology 2017; 284:228-243. 4. Right-sided thyroid nodule as detailed above. This can be further evaluated with ultrasound as clinically indicated. 5. Hepatic steatosis. 6. Advanced coronary artery calcifications are noted. 7. Dilated main pulmonary artery which can be seen in patients with elevated pulmonary artery pressures. Aortic Atherosclerosis (ICD10-I70.0). Electronically Signed   By: Constance Holster M.D.   On: 12/19/2018 17:04   [place] Chest (pleural Effusion)  Result Date: 12/20/2018 CLINICAL DATA:  RIGHT pleural effusion, for thoracentesis EXAM: CHEST ULTRASOUND COMPARISON:  CT chest 12/19/2018 FINDINGS: Sonography of the inferior RIGHT hemithorax was performed in anticipation of thoracentesis. Suboptimal visualization of pleural effusion due to body habitus. Pleural effusion appears to be partially loculated at the RIGHT lung base and small in size. Scattered internal echogenicity is seen. Inadequate pocket for thoracentesis was not visualized. IMPRESSION: Complicated small RIGHT pleural effusion. Thoracentesis not performed. Electronically Signed   By: Lavonia Dana M.D.   On: 12/20/2018 12:37      Scheduled Meds:  aspirin EC  81 mg Oral Daily   atorvastatin  80 mg Oral q1800   azithromycin  500 mg Oral q1800   carvedilol  12.5 mg Oral BID   clopidogrel  75 mg Oral Daily   enoxaparin (LOVENOX) injection  60 mg Subcutaneous Q24H   guaiFENesin  600 mg Oral BID   insulin aspart  0-15 Units Subcutaneous TID WC   insulin aspart  0-5 Units Subcutaneous QHS   isosorbide mononitrate  30 mg Oral Daily   nicotine  14 mg Transdermal Daily   pantoprazole  40 mg Oral Daily   potassium chloride  20 mEq Oral BID   sodium chloride flush  3 mL Intravenous Q12H   Continuous Infusions:  sodium chloride     cefTRIAXone (ROCEPHIN)  IV       LOS: 7 days      Debbe Odea, MD [facility] Pager: [email or web address] Password [email or web address] 12/21/2018, 1:18 PM

## 2018-12-22 ENCOUNTER — Inpatient Hospital Stay (HOSPITAL_COMMUNITY): Payer: Medicare HMO

## 2018-12-22 DIAGNOSIS — I509 Heart failure, unspecified: Secondary | ICD-10-CM

## 2018-12-22 LAB — GLUCOSE, CAPILLARY
Glucose-Capillary: 203 mg/dL — ABNORMAL HIGH (ref 70–99)
Glucose-Capillary: 327 mg/dL — ABNORMAL HIGH (ref 70–99)

## 2018-12-22 LAB — MAGNESIUM: Magnesium: 2.7 mg/dL — ABNORMAL HIGH (ref 1.7–2.4)

## 2018-12-22 MED ORDER — ALUM & MAG HYDROXIDE-SIMETH 200-200-20 MG/5ML PO SUSP: 30.0000 mL | ORAL | 0 refills | Status: DC | PRN

## 2018-12-22 MED ORDER — POTASSIUM CHLORIDE CRYS ER 20 MEQ PO TBCR
40.0000 meq | EXTENDED_RELEASE_TABLET | ORAL | Status: AC
  Administered 2018-12-22 (×2): 40 meq via ORAL
  Filled 2018-12-22 (×2): qty 2

## 2018-12-22 MED ORDER — CEFPODOXIME PROXETIL 200 MG PO TABS: 200.0000 mg | ORAL_TABLET | Freq: Two times a day (BID) | ORAL | 0 refills | Status: DC

## 2018-12-22 MED ORDER — AZITHROMYCIN 500 MG PO TABS: 500.0000 mg | ORAL_TABLET | Freq: Every day | ORAL | 0 refills | Status: DC

## 2018-12-22 MED ORDER — NICOTINE 14 MG/24HR TD PT24: 14.0000 mg | MEDICATED_PATCH | Freq: Every day | TRANSDERMAL | 0 refills | Status: DC

## 2018-12-22 MED ORDER — LIDOCAINE HCL (PF) 1 % IJ SOLN
INTRAMUSCULAR | Status: AC
  Filled 2018-12-22: qty 30

## 2018-12-22 MED ORDER — FUROSEMIDE 20 MG PO TABS: 20.0000 mg | ORAL_TABLET | Freq: Two times a day (BID) | ORAL | 0 refills | Status: DC

## 2018-12-22 NOTE — Progress Notes (Signed)
Patient ID: Thomas [name], male   DOB: 01/31/1956, 63 y.o.   MRN: [id number] Right chest evaluated with [place].  Trace right pleural identified.  Fluid is small but probably amendable to sampling.  Discussed with patient and he did not want to proceed with thoracentesis after I explained that the fluid volume is small and will likely not change his current breathing.  Explained that a diagnostic thoracentesis may help with his overall treatment but he still refused.  Patient returned to inpatient floor.  Discussed with Dr. Wynelle Cleveland.

## 2018-12-22 NOTE — Discharge Instructions (Signed)
c Heart Failure, Self Care Heart failure is a serious condition. This sheet explains things you need to do to take care of yourself at home. To help you stay as healthy as possible, you may be asked to change your diet, take certain medicines, and make other changes in your life. Your doctor may also give you more specific instructions. If you have problems or questions, call your doctor. What are the risks? Having heart failure makes it more likely for you to have some problems. These problems can get worse if you do not take good care of yourself. Problems may include:  Blood clotting problems. This may cause a stroke.  Damage to the kidneys, liver, or lungs.  Abnormal heart rhythms. Supplies needed:  Scale for weighing yourself.  Blood pressure monitor.  Notebook.  Medicines. How to care for yourself when you have heart failure Medicines Take over-the-counter and prescription medicines only as told by your doctor. Take your medicines every day.  Do not stop taking your medicine unless your doctor tells you to do so.  Do not skip any medicines.  Get your prescriptions refilled before you run out of medicine. This is important. Eating and drinking   Eat heart-healthy foods. Talk with a diet specialist (dietitian) to create an eating plan.  Choose foods that: ? Have no trans fat. ? Are low in saturated fat and cholesterol.  Choose healthy foods, such as: ? Fresh or frozen fruits and vegetables. ? Fish. ? Low-fat (lean) meats. ? Legumes, such as beans, peas, and lentils. ? Fat-free or low-fat dairy products. ? Whole-grain foods. ? High-fiber foods.  Limit salt (sodium) if told by your doctor. Ask your diet specialist to tell you which seasonings are healthy for your heart.  Cook in healthy ways instead of frying. Healthy ways of cooking include roasting, grilling, broiling, baking, poaching, steaming, and stir-frying.  Limit how much fluid you drink, if told by your  doctor. Alcohol use  Do not drink alcohol if: ? Your doctor tells you not to drink. ? Your heart was damaged by alcohol, or you have very bad heart failure. ? You are pregnant, may be pregnant, or are planning to become pregnant.  If you drink alcohol: ? Limit how much you use to:  0-1 drink a day for women.  0-2 drinks a day for men. ? Be aware of how much alcohol is in your drink. In the [place] one drink equals one 12 oz bottle of beer (355 mL), one 5 oz glass of wine (148 mL), or one 1 oz glass of hard liquor (44 mL). Lifestyle   Do not use any products that contain nicotine or tobacco, such as cigarettes, e-cigarettes, and chewing tobacco. If you need help quitting, ask your doctor. ? Do not use nicotine gum or patches before talking to your doctor.  Do not use illegal drugs.  Lose weight if told by your doctor.  Do physical activity if told by your doctor. Talk to your doctor before you begin an exercise if: ? You are an older adult. ? You have very bad heart failure.  Learn to manage stress. If you need help, ask your doctor.  Get rehab (rehabilitation) to help you stay independent and to help with your quality of life.  Plan time to rest when you get tired. Check weight and blood pressure   Weigh yourself every day. This will help you to know if fluid is building up in your body. ? Weigh yourself every  morning after you pee (urinate) and before you eat breakfast. ? Wear the same amount of clothing each time. ? Write down your daily weight. Give your record to your doctor.  Check and write down your blood pressure as told by your doctor.  Check your pulse as told by your doctor. Dealing with very hot and very cold weather  If it is very hot: ? Avoid activities that take a lot of energy. ? Use air conditioning or fans, or find a cooler place. ? Avoid caffeine and alcohol. ? Wear clothing that is loose-fitting, lightweight, and light-colored.  If it is very  cold: ? Avoid activities that take a lot of energy. ? Layer your clothes. ? Wear mittens or gloves, a hat, and a scarf when you go outside. ? Avoid alcohol. Follow these instructions at home:  Stay up to date with shots (vaccines). Get pneumococcal and flu (influenza) shots.  Keep all follow-up visits as told by your doctor. This is important. Contact a doctor if:  You gain weight quickly.  You have increasing shortness of breath.  You cannot do your normal activities.  You get tired easily.  You cough a lot.  You don't feel like eating or feel like you may vomit (nauseous).  You become puffy (swell) in your hands, feet, ankles, or belly (abdomen).  You cannot sleep well because it is hard to breathe.  You feel like your heart is beating fast (palpitations).  You get dizzy when you stand up. Get help right away if:  You have trouble breathing.  You or someone else notices a change in your behavior, such as having trouble staying awake.  You have chest pain or discomfort.  You pass out (faint). These symptoms may be an emergency. Do not wait to see if the symptoms will go away. Get medical help right away. Call your local emergency services (911 in the [place] Do not drive yourself to the hospital. Summary  Heart failure is a serious condition. To care for yourself, you may have to change your diet, take medicines, and make other lifestyle changes.  Take your medicines every day. Do not stop taking them unless your doctor tells you to do so.  Eat heart-healthy foods, such as fresh or frozen fruits and vegetables, fish, lean meats, legumes, fat-free or low-fat dairy products, and whole-grain or high-fiber foods.  Ask your doctor if you can drink alcohol. You may have to stop alcohol use if you have very bad heart failure.  Contact your doctor if you gain weight quickly or feel that your heart is beating too fast. Get help right away if you pass out, or have chest pain  or trouble breathing. This information is not intended to replace advice given to you by your health care provider. Make sure you discuss any questions you have with your health care provider. Document Released: 07/18/2018 Document Revised: 07/17/2018 Document Reviewed: 07/18/2018 Elsevier Patient Education  2020 [employer].

## 2018-12-22 NOTE — Discharge Summary (Addendum)
Physician Discharge Summary  Thomas [name] FXT:[id number] DOB: 01-27-1956 DOA: 12/14/2018  PCP: Monico Blitz, MD  Admit date: 12/14/2018 Discharge date: 12/22/2018  Admitted From:home  Disposition:  home   Recommendations for Outpatient Follow-up:  1. Need pulmonary nodule followed as outpt 2. Needs sleep study  Home Health:  RN     Discharge Condition:  stable   CODE STATUS:  Full code   Diet recommendation:  Low sodium carb modified and hear healthy Consultations:  IR   Discharge Diagnoses:  Principal Problem:   Acute respiratory failure with hypoxia and hypercapnia (HCC) Active Problems:   Acute on chronic systolic CHF (congestive heart failure) (HCC)   Loculated pleural effusion on right   Right-sided multifocal PNA (pneumonia)   Morbid obesity ([place])   Ischemic cardiomyopathy   Tobacco abuse   DM (diabetes mellitus) ([place])     Brief Summary: Thomas [name] is a 63 year old male with coronary artery disease/NSTEMI in 03/2018, ischemic cardiomyopathy with an EF of 45% on echo on 10/2018, diabetes mellitus, tobacco abuse, morbid obesity who was admitted on 12/14/18 admitted from the cardiology office with acute respiratory failure secondary to acute systolic heart failure.  He was started on IV Lasix and metolazone. He was diuresed with a noted improvement in weight from 303 to 275 pounds.  He remained hypoxic and had dyspnea and a CT scan was done on 9/2 which suggested a possible pneumonia with a small to moderate complicated right-sided pleural effusion.  He was treated with Rocephin and azithromycin. An ultrasound on 9/3 showed a small complicated pleural effusion  He was transferred to [facility] for a thoracentesis.  Hospital Course:  Principal Problem:   Acute respiratory failure with hypoxia and hypercapnia -Initially suspected to be secondary to acute systolic heart failure and therefore he was diuresed with IV Lasix- Interestingly he states he is not aware  he has CHF and drinks large amounts of water a day- his wife states he drinks large amounts of gatoraid and was getting at least 600 mg of sodium from this - I have increased his lasix from 20 mg daily to 20 mg BID- I have explained to him to drink no more thatn 5-6 cups of liquid a day - Subsequently found to have a right-sided consolidation on CT scan suggestive of a right lobar pneumonia on 9/2 and therefore ceftriaxone and azithromycin were started - Also has what appears to be a small right complicated pleural effusion and therefore, he has been transferred to [facility] and IR plans for thoracentesis today-  IR has evaluated the patient and noted effusion is quite small- patient refuses a thoracentesis as he is afraid of needle - he has no fever or leukocytosis to suggest that he may have an empyema - I have had a conversation with the patient and with his wife explaining his medical issues and have answers all questions     Active Problems:    DM (diabetes mellitus) ([place]) - Hemoglobin A1c is 8.2 -He has been refusing insulin sliding scale - cont home Invokana and Metformin   Ischemic cardiomyopathy/ CAD - NSTEMI- HLD - Cont ASA, Plavix, Coreg, Lipitor, Imdur    Morbid obesity Body mass index is 39.88 kg/m.  Needs to lose weight     Tobacco abuse -  Nicotine patch ordered but he has declined- counseled to stop smoking  Pulmonary nodules in a smoker - Noted on CT- need to be followed closely as outpt   Discharge Exam:  Vitals:   12/22/18 0817 12/22/18 1251  BP: (!) 141/74 116/67  Pulse: 80 88  Resp: 20 19  Temp:  97.9 F (36.6 C)  SpO2:  93%   Vitals:   12/22/18 0505 12/22/18 0548 12/22/18 0817 12/22/18 1251  BP: 132/62  (!) 141/74 116/67  Pulse: 75  80 88  Resp: 20  20 19   Temp: (!) 97.4 F (36.3 C)   97.9 F (36.6 C)  TempSrc: Oral   Oral  SpO2: 95%   93%  Weight:  123.3 kg    Height:        General: Pt is alert, awake, not in acute  distress Cardiovascular: RRR, S1/S2 +, no rubs, no gallops Respiratory: CTA bilaterally, no wheezing, no rhonchi Abdominal: Soft, NT, ND, bowel sounds + Extremities: no edema, no cyanosis   Discharge Instructions  Discharge Instructions    Diet - low sodium heart healthy   Complete by: As directed    Diet Carb Modified   Complete by: As directed    Increase activity slowly   Complete by: As directed      Allergies as of 12/22/2018      Reactions   Codeine Nausea And Vomiting   Doxycycline       Medication List    STOP taking these medications   ibuprofen 200 MG tablet Commonly known as: ADVIL     TAKE these medications   alum & mag hydroxide-simeth 200-200-20 MG/5ML suspension Commonly known as: MAALOX/MYLANTA Take 30 mLs by mouth every 2 (two) hours as needed for indigestion.   aspirin EC 81 MG tablet Take 81 mg by mouth daily.   atorvastatin 80 MG tablet Commonly known as: LIPITOR Take 1 tablet (80 mg total) by mouth daily at 6 PM.   azithromycin 500 MG tablet Commonly known as: ZITHROMAX Take 1 tablet (500 mg total) by mouth daily at 6 PM.   carvedilol 12.5 MG tablet Commonly known as: COREG Take 1 tablet (12.5 mg total) by mouth 2 (two) times daily.   cefpodoxime 200 MG tablet Commonly known as: VANTIN Take 1 tablet (200 mg total) by mouth 2 (two) times daily.   clopidogrel 75 MG tablet Commonly known as: PLAVIX Take 1 tablet (75 mg total) by mouth daily with breakfast.   Entresto 24-26 MG Generic drug: sacubitril-valsartan Take 0.5 tablets by mouth 2 (two) times daily.   furosemide 20 MG tablet Commonly known as: LASIX Take 1 tablet (20 mg total) by mouth 2 (two) times daily. What changed: when to take this   gabapentin 300 MG capsule Commonly known as: NEURONTIN Take 1 capsule by mouth 3 (three) times daily.   Invokana 300 MG Tabs tablet Generic drug: canagliflozin Take 300 mg by mouth daily before breakfast.   isosorbide mononitrate 30  MG 24 hr tablet Commonly known as: IMDUR Take 1 tablet (30 mg total) by mouth daily.   metFORMIN 500 MG 24 hr tablet Commonly known as: GLUCOPHAGE-XR Take 1,000 mg by mouth 2 (two) times daily.   nitroGLYCERIN 0.4 MG SL tablet Commonly known as: NITROSTAT Place 1 tablet (0.4 mg total) under the tongue every 5 (five) minutes as needed for chest pain.   omeprazole 40 MG capsule Commonly known as: PRILOSEC Take 40 mg by mouth daily.   potassium chloride 10 MEQ tablet Commonly known as: K-DUR Take 1 tablet (10 mEq total) by mouth daily.      Follow-up Information    [facility] Follow up.   Why: Home  Oxygen        Monico Blitz, MD.   Specialty: Internal Medicine Contact information: [address] [place] [address] [phone]          Allergies  Allergen Reactions  . Codeine Nausea And Vomiting  . Doxycycline      Procedures/Studies:  Dg Chest 2 View  Result Date: 12/19/2018 CLINICAL DATA:  63 year old male with history of dyspnea. EXAM: CHEST - 2 VIEW COMPARISON:  Chest x-ray 12/16/2018. FINDINGS: Moderate right pleural effusion. Atelectasis and/or consolidation in the right lung base. Probable subsegmental atelectasis in the left base. No evidence of pulmonary edema. Heart size is normal. Upper mediastinal contours are within normal limits. Aortic atherosclerosis. IMPRESSION: 1. Moderate right-sided pleural effusion with increasing complexity compared to the prior examination. If there has been interval thoracentesis, this could reflect some internal pleural gas. Alternatively, this could simply reflect a decreasing right pleural effusion with some improving aeration in the right middle and lower lobes. Clinical correlation is recommended. If there is clinical concern for empyema or other complexity in the right pleural fluid or right lung, further evaluation with contrast enhanced chest CT could be considered. 2. Aortic atherosclerosis. Electronically Signed   By:  Vinnie Langton M.D.   On: 12/19/2018 10:07   Dg Chest 2 View  Result Date: 12/16/2018 CLINICAL DATA:  Shortness of breath EXAM: CHEST - 2 VIEW COMPARISON:  12/14/2018 FINDINGS: Cardiomegaly. Right effusion with atelectasis of the right lower lung. Patchy atelectasis in the left lower lung. Upper lungs are clear. IMPRESSION: Large right effusion with right lower lung volume loss. Patchy atelectasis at the left base. Electronically Signed   By: Nelson Chimes M.D.   On: 12/16/2018 11:41   Ct Chest Wo Contrast  Result Date: 12/19/2018 CLINICAL DATA:  Dyspnea EXAM: CT CHEST WITHOUT CONTRAST TECHNIQUE: Multidetector CT imaging of the chest was performed following the standard protocol without IV contrast. COMPARISON:  None. FINDINGS: Cardiovascular: The main pulmonary artery is dilated measuring approximately 4 cm in diameter. There are advanced coronary artery calcifications. The heart size is relatively normal. There is no significant pericardial effusion. There are mild atherosclerotic changes of the thoracic aorta. Mediastinum/Nodes: --there prominent but subcentimeter mediastinal and hilar lymph nodes. --No axillary lymphadenopathy. --No supraclavicular lymphadenopathy. --there is a 1.8 cm right-sided thyroid nodule. --The esophagus is unremarkable Lungs/Pleura: There is a small to moderate size possibly with loculated right-sided pleural effusion. There is airspace consolidation involving the right middle lobe and right lower lobe. There is some debris within the trachea. There is no convincing endobronchial lesion. There is a mild amount atelectasis at the left lung base. There is no pneumothorax. There is a relatively hyperdense 5 mm pulmonary nodule in the right upper lobe (axial series 4, image 66). There is a 4 mm pulmonary nodule in the right middle lobe (axial series 4, image 93). Upper Abdomen: There is hepatic steatosis. Musculoskeletal: No chest wall abnormality. No acute or significant osseous  findings. Review of the MIP images confirms the above findings. IMPRESSION: 1. Small to moderate size right-sided pleural effusion that appears to be at least partially loculated. 2. Airspace consolidation involving the right middle lobe and right lower lobe concerning for multifocal pneumonia. Follow-up to resolution is recommended as an underlying mass cannot be excluded. 3. There are few scattered 4 mm pulmonary nodules as detailed above. No follow-up needed if patient is low-risk (and has no known or suspected primary neoplasm). Non-contrast chest CT can be considered in 12 months if  patient is high-risk. This recommendation follows the consensus statement: Guidelines for Management of Incidental Pulmonary Nodules Detected on CT Images: From the Fleischner Society 2017; Radiology 2017; 284:228-243. 4. Right-sided thyroid nodule as detailed above. This can be further evaluated with ultrasound as clinically indicated. 5. Hepatic steatosis. 6. Advanced coronary artery calcifications are noted. 7. Dilated main pulmonary artery which can be seen in patients with elevated pulmonary artery pressures. Aortic Atherosclerosis (ICD10-I70.0). Electronically Signed   By: Constance Holster M.D.   On: 12/19/2018 17:04   [place] Chest (pleural Effusion)  Result Date: 12/22/2018 CLINICAL DATA:  63 year old with a small right pleural effusion. Evaluate for thoracentesis. EXAM: CHEST ULTRASOUND COMPARISON:  Chest CT 12/19/2018 FINDINGS: There is a very small amount of pleural fluid in the right chest. Right thoracentesis was discussed with the patient. IMPRESSION: Very small amount of right pleural fluid. The right pleural fluid was amendable for a diagnostic thoracentesis but patient refused the procedure. Electronically Signed   By: Markus Daft M.D.   On: 12/22/2018 15:23   [place] Chest (pleural Effusion)  Result Date: 12/20/2018 CLINICAL DATA:  RIGHT pleural effusion, for thoracentesis EXAM: CHEST ULTRASOUND COMPARISON:  CT chest  12/19/2018 FINDINGS: Sonography of the inferior RIGHT hemithorax was performed in anticipation of thoracentesis. Suboptimal visualization of pleural effusion due to body habitus. Pleural effusion appears to be partially loculated at the RIGHT lung base and small in size. Scattered internal echogenicity is seen. Inadequate pocket for thoracentesis was not visualized. IMPRESSION: Complicated small RIGHT pleural effusion. Thoracentesis not performed. Electronically Signed   By: Lavonia Dana M.D.   On: 12/20/2018 12:37   [place] Chest (pleural Effusion)  Result Date: 12/17/2018 CLINICAL DATA:  CHF, RIGHT pleural effusion, shortness of breath EXAM: CHEST ULTRASOUND COMPARISON:  Chest radiograph 12/16/2018 FINDINGS: Sonography demonstrates a small pleural effusion at the RIGHT lung base. Amount of pleural effusion is less than anticipated from the chest radiograph, which is due to elevation of the diaphragm sonographically. Thoracentesis not performed at this time. IMPRESSION: Small RIGHT pleural effusion. Electronically Signed   By: Lavonia Dana M.D.   On: 12/17/2018 10:31   Dg Chest Portable 1 View  Result Date: 12/14/2018 CLINICAL DATA:  Shortness of breath for the past month. 30 pound weight gain. EXAM: PORTABLE CHEST 1 VIEW COMPARISON:  Chest x-ray dated March 29, 2018. FINDINGS: The heart is borderline enlarged size. Mild pulmonary vascular congestion. Small right pleural effusion with adjacent right basilar atelectasis. No pneumothorax. No acute osseous abnormality. IMPRESSION: 1. Small right pleural effusion with adjacent right basilar atelectasis. Electronically Signed   By: Titus Dubin M.D.   On: 12/14/2018 18:28     The results of significant diagnostics from this hospitalization (including imaging, microbiology, ancillary and laboratory) are listed below for reference.     Microbiology: Recent Results (from the past 240 hour(s))  SARS Coronavirus 2 [facility] order, Performed in [facility]  hospital lab) Nasopharyngeal Nasopharyngeal Swab     Status: None   Collection Time: 12/14/18  5:56 PM   Specimen: Nasopharyngeal Swab  Result Value Ref Range Status   SARS Coronavirus 2 NEGATIVE NEGATIVE Final    Comment: (NOTE) If result is NEGATIVE SARS-CoV-2 target nucleic acids are NOT DETECTED. The SARS-CoV-2 RNA is generally detectable in upper and lower  respiratory specimens during the acute phase of infection. The lowest  concentration of SARS-CoV-2 viral copies this assay can detect is 250  copies / mL. A negative result does not preclude SARS-CoV-2 infection  and should  not be used as the sole basis for treatment or other  patient management decisions.  A negative result may occur with  improper specimen collection / handling, submission of specimen other  than nasopharyngeal swab, presence of viral mutation(s) within the  areas targeted by this assay, and inadequate number of viral copies  (<250 copies / mL). A negative result must be combined with clinical  observations, patient history, and epidemiological information. If result is POSITIVE SARS-CoV-2 target nucleic acids are DETECTED. The SARS-CoV-2 RNA is generally detectable in upper and lower  respiratory specimens dur ing the acute phase of infection.  Positive  results are indicative of active infection with SARS-CoV-2.  Clinical  correlation with patient history and other diagnostic information is  necessary to determine patient infection status.  Positive results do  not rule out bacterial infection or co-infection with other viruses. If result is PRESUMPTIVE POSTIVE SARS-CoV-2 nucleic acids MAY BE PRESENT.   A presumptive positive result was obtained on the submitted specimen  and confirmed on repeat testing.  While 2019 novel coronavirus  (SARS-CoV-2) nucleic acids may be present in the submitted sample  additional confirmatory testing may be necessary for epidemiological  and / or clinical management  purposes  to differentiate between  SARS-CoV-2 and other Sarbecovirus currently known to infect humans.  If clinically indicated additional testing with an alternate test  methodology [phone]) is advised. The SARS-CoV-2 RNA is generally  detectable in upper and lower respiratory sp ecimens during the acute  phase of infection. The expected result is Negative. Fact Sheet for Patients:  StrictlyIdeas.no Fact Sheet for Healthcare Providers: BankingDealers.co.za This test is not yet approved or cleared by the [place] FDA and has been authorized for detection and/or diagnosis of SARS-CoV-2 by FDA under an Emergency Use Authorization (EUA).  This EUA will remain in effect (meaning this test can be used) for the duration of the COVID-19 declaration under Section 564(b)(1) of the Act, 21 U.S.C. section 360bbb-3(b)(1), unless the authorization is terminated or revoked sooner. Performed at [facility], [address], [place], [place] [address]   SARS CORONAVIRUS 2 (TAT 6-24 HRS) Nasopharyngeal Nasopharyngeal Swab     Status: None   Collection Time: 12/21/18  8:07 AM   Specimen: Nasopharyngeal Swab  Result Value Ref Range Status   SARS Coronavirus 2 NEGATIVE NEGATIVE Final    Comment: (NOTE) SARS-CoV-2 target nucleic acids are NOT DETECTED. The SARS-CoV-2 RNA is generally detectable in upper and lower respiratory specimens during the acute phase of infection. Negative results do not preclude SARS-CoV-2 infection, do not rule out co-infections with other pathogens, and should not be used as the sole basis for treatment or other patient management decisions. Negative results must be combined with clinical observations, patient history, and epidemiological information. The expected result is Negative. Fact Sheet for Patients: SugarRoll.be Fact Sheet for Healthcare  Providers: https://www.woods-mathews.com/ This test is not yet approved or cleared by the [place] FDA and  has been authorized for detection and/or diagnosis of SARS-CoV-2 by FDA under an Emergency Use Authorization (EUA). This EUA will remain  in effect (meaning this test can be used) for the duration of the COVID-19 declaration under Section 56 4(b)(1) of the Act, 21 U.S.C. section 360bbb-3(b)(1), unless the authorization is terminated or revoked sooner. Performed at [facility], [place] [address], [place], [place] [address]      Labs: BNP (last 3 results) Recent Labs    12/14/18 1815  BNP 78.0   Basic  Metabolic Panel: Recent Labs  Lab 12/17/18 0508 12/18/18 0456 12/19/18 0438 12/20/18 0519 12/21/18 0836 12/22/18 0759  NA 141 140 138 138 137  --   K 4.1 3.9 3.3* 3.2* 2.8*  --   CL 80* 78* 77* 76* 72*  --   CO2 45* 44* 50* 45* 46*  --   GLUCOSE 149* 179* 200* 183* 193*  --   BUN 27* 39* 44* 51* 53*  --   CREATININE 0.87 0.83 0.89 0.91 1.05  --   CALCIUM 9.3 9.5 9.2 9.1 9.1  --   MG  --   --   --  2.7*  --  2.7*   Liver Function Tests: No results for input(s): AST, ALT, ALKPHOS, BILITOT, PROT, ALBUMIN in the last 168 hours. No results for input(s): LIPASE, AMYLASE in the last 168 hours. No results for input(s): AMMONIA in the last 168 hours. CBC: Recent Labs  Lab 12/19/18 0438 12/21/18 0836  WBC 8.7 7.2  HGB 18.7* 18.6*  HCT 61.9* 61.1*  MCV 90.9 90.7  PLT 180 173   Cardiac Enzymes: No results for input(s): CKTOTAL, CKMB, CKMBINDEX, TROPONINI in the last 168 hours. BNP: Invalid input(s): POCBNP CBG: Recent Labs  Lab 12/21/18 1153 12/21/18 1631 12/21/18 2119 12/22/18 0634 12/22/18 1117  GLUCAP 237* 226* 236* 203* 327*   D-Dimer No results for input(s): DDIMER in the last 72 hours. Hgb A1c No results for input(s): HGBA1C in the last 72 hours. Lipid Profile No results for input(s): CHOL, HDL, LDLCALC, TRIG, CHOLHDL,  LDLDIRECT in the last 72 hours. Thyroid function studies No results for input(s): TSH, T4TOTAL, T3FREE, THYROIDAB in the last 72 hours.  Invalid input(s): FREET3 Anemia work up No results for input(s): VITAMINB12, FOLATE, FERRITIN, TIBC, IRON, RETICCTPCT in the last 72 hours. Urinalysis No results found for: COLORURINE, APPEARANCEUR, LABSPEC, [place], GLUCOSEU, [place], BILIRUBINUR, KETONESUR, PROTEINUR, UROBILINOGEN, NITRITE, LEUKOCYTESUR Sepsis Labs Invalid input(s): PROCALCITONIN,  WBC,  [place] Microbiology Recent Results (from the past 240 hour(s))  SARS Coronavirus 2 [facility] order, Performed in [facility] hospital lab) Nasopharyngeal Nasopharyngeal Swab     Status: None   Collection Time: 12/14/18  5:56 PM   Specimen: Nasopharyngeal Swab  Result Value Ref Range Status   SARS Coronavirus 2 NEGATIVE NEGATIVE Final    Comment: (NOTE) If result is NEGATIVE SARS-CoV-2 target nucleic acids are NOT DETECTED. The SARS-CoV-2 RNA is generally detectable in upper and lower  respiratory specimens during the acute phase of infection. The lowest  concentration of SARS-CoV-2 viral copies this assay can detect is 250  copies / mL. A negative result does not preclude SARS-CoV-2 infection  and should not be used as the sole basis for treatment or other  patient management decisions.  A negative result may occur with  improper specimen collection / handling, submission of specimen other  than nasopharyngeal swab, presence of viral mutation(s) within the  areas targeted by this assay, and inadequate number of viral copies  (<250 copies / mL). A negative result must be combined with clinical  observations, patient history, and epidemiological information. If result is POSITIVE SARS-CoV-2 target nucleic acids are DETECTED. The SARS-CoV-2 RNA is generally detectable in upper and lower  respiratory specimens dur ing the acute phase of infection.  Positive  results are indicative of active  infection with SARS-CoV-2.  Clinical  correlation with patient history and other diagnostic information is  necessary to determine patient infection status.  Positive results do  not rule out bacterial infection or  co-infection with other viruses. If result is PRESUMPTIVE POSTIVE SARS-CoV-2 nucleic acids MAY BE PRESENT.   A presumptive positive result was obtained on the submitted specimen  and confirmed on repeat testing.  While 2019 novel coronavirus  (SARS-CoV-2) nucleic acids may be present in the submitted sample  additional confirmatory testing may be necessary for epidemiological  and / or clinical management purposes  to differentiate between  SARS-CoV-2 and other Sarbecovirus currently known to infect humans.  If clinically indicated additional testing with an alternate test  methodology [phone]) is advised. The SARS-CoV-2 RNA is generally  detectable in upper and lower respiratory sp ecimens during the acute  phase of infection. The expected result is Negative. Fact Sheet for Patients:  StrictlyIdeas.no Fact Sheet for Healthcare Providers: BankingDealers.co.za This test is not yet approved or cleared by the [place] FDA and has been authorized for detection and/or diagnosis of SARS-CoV-2 by FDA under an Emergency Use Authorization (EUA).  This EUA will remain in effect (meaning this test can be used) for the duration of the COVID-19 declaration under Section 564(b)(1) of the Act, 21 U.S.C. section 360bbb-3(b)(1), unless the authorization is terminated or revoked sooner. Performed at [facility], [address] [place], [place] [address]   SARS CORONAVIRUS 2 (TAT 6-24 HRS) Nasopharyngeal Nasopharyngeal Swab     Status: None   Collection Time: 12/21/18  8:07 AM   Specimen: Nasopharyngeal Swab  Result Value Ref Range Status   SARS Coronavirus 2 NEGATIVE NEGATIVE Final    Comment: (NOTE) SARS-CoV-2 target nucleic acids are  NOT DETECTED. The SARS-CoV-2 RNA is generally detectable in upper and lower respiratory specimens during the acute phase of infection. Negative results do not preclude SARS-CoV-2 infection, do not rule out co-infections with other pathogens, and should not be used as the sole basis for treatment or other patient management decisions. Negative results must be combined with clinical observations, patient history, and epidemiological information. The expected result is Negative. Fact Sheet for Patients: SugarRoll.be Fact Sheet for Healthcare Providers: https://www.woods-mathews.com/ This test is not yet approved or cleared by the [place] FDA and  has been authorized for detection and/or diagnosis of SARS-CoV-2 by FDA under an Emergency Use Authorization (EUA). This EUA will remain  in effect (meaning this test can be used) for the duration of the COVID-19 declaration under Section 56 4(b)(1) of the Act, 21 U.S.C. section 360bbb-3(b)(1), unless the authorization is terminated or revoked sooner. Performed at [facility], [place] [address], [place], [place] [address]      Time coordinating discharge in minutes: 75  SIGNED:   Debbe Odea, MD  [facility] 12/22/2018, 3:28 PM Pager   If 7PM-7AM, please contact night-coverage [email or web address] Password [email or web address]

## 2018-12-22 NOTE — Plan of Care (Signed)
  Problem: Education: Goal: Knowledge of General Education information will improve Description: Including pain rating scale, medication(s)/side effects and non-pharmacologic comfort measures Outcome: Progressing   Problem: Health Behavior/Discharge Planning: Goal: Ability to manage health-related needs will improve Outcome: Progressing   Problem: Clinical Measurements: Goal: Ability to maintain clinical measurements within normal limits will improve Outcome: Progressing   Problem: Clinical Measurements: Goal: Respiratory complications will improve Outcome: Progressing   Problem: Activity: Goal: Risk for activity intolerance will decrease Outcome: Progressing   Problem: Coping: Goal: Level of anxiety will decrease Outcome: Progressing

## 2018-12-22 NOTE — Care Management (Signed)
[place] RN set up with [employer]. Patient states he is on RA, RA charted, no order for home oxygen or mention of it in DC note.  No other CM needs identified.

## 2018-12-26 DIAGNOSIS — I219 Acute myocardial infarction, unspecified: Secondary | ICD-10-CM | POA: Diagnosis not present

## 2018-12-26 DIAGNOSIS — I5030 Unspecified diastolic (congestive) heart failure: Secondary | ICD-10-CM | POA: Diagnosis not present

## 2018-12-26 DIAGNOSIS — E1165 Type 2 diabetes mellitus with hyperglycemia: Secondary | ICD-10-CM | POA: Diagnosis not present

## 2018-12-26 DIAGNOSIS — F17210 Nicotine dependence, cigarettes, uncomplicated: Secondary | ICD-10-CM | POA: Diagnosis not present

## 2018-12-26 DIAGNOSIS — I110 Hypertensive heart disease with heart failure: Secondary | ICD-10-CM | POA: Diagnosis not present

## 2018-12-26 DIAGNOSIS — Z6841 Body Mass Index (BMI) 40.0 and over, adult: Secondary | ICD-10-CM | POA: Diagnosis not present

## 2018-12-26 DIAGNOSIS — I10 Essential (primary) hypertension: Secondary | ICD-10-CM | POA: Diagnosis not present

## 2018-12-27 DIAGNOSIS — I509 Heart failure, unspecified: Secondary | ICD-10-CM | POA: Diagnosis not present

## 2018-12-27 DIAGNOSIS — I10 Essential (primary) hypertension: Secondary | ICD-10-CM | POA: Diagnosis not present

## 2018-12-27 DIAGNOSIS — F17210 Nicotine dependence, cigarettes, uncomplicated: Secondary | ICD-10-CM | POA: Diagnosis not present

## 2018-12-27 DIAGNOSIS — I5030 Unspecified diastolic (congestive) heart failure: Secondary | ICD-10-CM | POA: Diagnosis not present

## 2018-12-27 DIAGNOSIS — Z299 Encounter for prophylactic measures, unspecified: Secondary | ICD-10-CM | POA: Diagnosis not present

## 2018-12-27 DIAGNOSIS — I700 Atherosclerosis of aorta: Secondary | ICD-10-CM | POA: Diagnosis not present

## 2018-12-27 DIAGNOSIS — Z6841 Body Mass Index (BMI) 40.0 and over, adult: Secondary | ICD-10-CM | POA: Diagnosis not present

## 2018-12-27 DIAGNOSIS — R339 Retention of urine, unspecified: Secondary | ICD-10-CM | POA: Diagnosis not present

## 2018-12-28 ENCOUNTER — Other Ambulatory Visit: Payer: Self-pay | Admitting: *Deleted

## 2018-12-28 NOTE — Patient Outreach (Signed)
Initial telephone outreach. Call went unanswered, however, was able to leave a message and request a return call.  Eulah Pont. Myrtie Neither, MSN, [facility] Gerontological Nurse Practitioner [facility] Care Management [phone]

## 2019-01-01 ENCOUNTER — Other Ambulatory Visit: Payer: Self-pay | Admitting: *Deleted

## 2019-01-01 ENCOUNTER — Encounter: Payer: Self-pay | Admitting: *Deleted

## 2019-01-01 NOTE — Patient Outreach (Signed)
Second outreach for initial care management assessment.  Introduced myself and explained the referral from [facility] for care management services. Mr. Zinn says he is perfect and doesn't need a thing. He says he already declined service from home health.  I am closing this case. I will send him a letter regarding our servies.  Eulah Pont. Myrtie Neither, MSN, [facility] Gerontological Nurse Practitioner [facility] Care Management [phone]

## 2019-01-02 ENCOUNTER — Encounter: Payer: Self-pay | Admitting: Cardiology

## 2019-01-02 ENCOUNTER — Telehealth (INDEPENDENT_AMBULATORY_CARE_PROVIDER_SITE_OTHER): Payer: Medicare HMO | Admitting: Cardiology

## 2019-01-02 VITALS — BP 147/76 | HR 92 | Ht 69.0 in | Wt 278.0 lb

## 2019-01-02 DIAGNOSIS — I429 Cardiomyopathy, unspecified: Secondary | ICD-10-CM | POA: Diagnosis not present

## 2019-01-02 DIAGNOSIS — E782 Mixed hyperlipidemia: Secondary | ICD-10-CM

## 2019-01-02 DIAGNOSIS — I25119 Atherosclerotic heart disease of native coronary artery with unspecified angina pectoris: Secondary | ICD-10-CM | POA: Diagnosis not present

## 2019-01-02 DIAGNOSIS — I5042 Chronic combined systolic (congestive) and diastolic (congestive) heart failure: Secondary | ICD-10-CM | POA: Diagnosis not present

## 2019-01-02 NOTE — Patient Instructions (Addendum)
Medication Instructions:   Your physician recommends that you continue on your current medications as directed. Please refer to the Current Medication list given to you today.  Labwork:  NONE  Testing/Procedures:  NONE  Follow-Up:  Your physician recommends that you schedule a follow-up appointment in: 4-6 weeks at the [place] office with Bernerd Pho  Any Other Special Instructions Will Be Listed Below (If Applicable).  If you need a refill on your cardiac medications before your next appointment, please call your pharmacy.

## 2019-01-02 NOTE — Progress Notes (Signed)
Virtual Visit via Telephone Note   This visit type was conducted due to national recommendations for restrictions regarding the COVID-19 Pandemic (e.g. social distancing) in an effort to limit this patient's exposure and mitigate transmission in our community.  Due to his co-morbid illnesses, this patient is at least at moderate risk for complications without adequate follow up.  This format is felt to be most appropriate for this patient at this time.  The patient did not have access to video technology/had technical difficulties with video requiring transitioning to audio format only (telephone).  All issues noted in this document were discussed and addressed.  No physical exam could be performed with this format.  Please refer to the patient's chart for his  consent to telehealth for [facility].   Date:  01/02/2019   ID:  Thomas [name], DOB [id number], MRN [id number]  Patient Location: Home Provider Location: Office  PCP:  Monico Blitz, MD  Cardiologist:  Rozann Lesches, MD Electrophysiologist:  None   Evaluation Performed:  Follow-Up Visit  Chief Complaint:   Cardiac follow-up  History of Present Illness:    Thomas [name] is a 63 y.o. male last seen in August and referred for hospitalization for management of acute on chronic combined heart failure.  He was managed on the hospitalist service at [facility], had a substantial diuresis on IV Lasix with metolazone and weight loss of 28 pounds.  Chest CT demonstrated possible pneumonia as well as a small to moderate sized complicated right-sided pleural effusion - he declined thoracentesis.  He was treated with antibiotics.  Cardiac regimen at discharge included carvedilol, Entresto, Imdur, Lasix at 20 mg twice daily, aspirin, and Plavix.  We spoke by phone today, also spoke with his wife present.  He states that he feels "great."  We discussed his hospitalization and current situation, he continues to go back to the fact that he was  treated for pneumonia, and seems to have difficulty believing that he has a cardiomyopathy and congestive heart failure.  I discussed these issues in great detail with both of them.  I reviewed his medications.  He reports compliance.  We discussed the importance of daily weights and fluid restriction to under 2 L a day.  We also discussed intermittent increase in Lasix based on weight change of 2 to 3 pounds in 24 hours.  He does not report any active angina at this time.  The patient does not have symptoms concerning for COVID-19 infection (fever, chills, cough, or new shortness of breath).    Past Medical History:  Diagnosis Date  . Morbid obesity ([place])   . NSTEMI (non-ST elevated myocardial infarction) [place])    December 2019  . Secondary cardiomyopathy ([place])   . Snoring   . Tobacco abuse   . Type 2 diabetes mellitus ([place])    Past Surgical History:  Procedure Laterality Date  . LEFT HEART CATH AND CORONARY ANGIOGRAPHY N/A 03/30/2018   Procedure: LEFT HEART CATH AND CORONARY ANGIOGRAPHY;  Surgeon: Troy Sine, MD;  Location: [place] [facility] LAB;  Service: Cardiovascular;  Laterality: N/A;     Current Meds  Medication Sig  . alum & mag hydroxide-simeth (MAALOX/MYLANTA) 200-200-20 MG/5ML suspension Take 30 mLs by mouth every 2 (two) hours as needed for indigestion.  Marland Kitchen aspirin EC 81 MG tablet Take 81 mg by mouth daily.  Marland Kitchen atorvastatin (LIPITOR) 80 MG tablet Take 1 tablet (80 mg total) by mouth daily at 6 PM.  . canagliflozin [facility])  300 MG TABS tablet Take 300 mg by mouth daily before breakfast.  . carvedilol (COREG) 12.5 MG tablet Take 1 tablet (12.5 mg total) by mouth 2 (two) times daily.  . clopidogrel (PLAVIX) 75 MG tablet Take 1 tablet (75 mg total) by mouth daily with breakfast.  . furosemide (LASIX) 20 MG tablet Take 1 tablet (20 mg total) by mouth 2 (two) times daily.  . isosorbide mononitrate (IMDUR) 30 MG 24 hr tablet Take 1 tablet (30 mg total) by mouth daily.  .  metFORMIN (GLUCOPHAGE-XR) 500 MG 24 hr tablet Take 1,000 mg by mouth 2 (two) times daily.  . nitroGLYCERIN (NITROSTAT) 0.4 MG SL tablet Place 1 tablet (0.4 mg total) under the tongue every 5 (five) minutes as needed for chest pain.  Marland Kitchen omeprazole (PRILOSEC) 40 MG capsule Take 40 mg by mouth daily.  . potassium chloride (K-DUR) 10 MEQ tablet Take 1 tablet (10 mEq total) by mouth daily.  . sacubitril-valsartan (ENTRESTO) 24-26 MG Take 0.5 tablets by mouth 2 (two) times daily.     Allergies:   Codeine and Doxycycline   Social History   Tobacco Use  . Smoking status: Current Every Day Smoker    Packs/day: 1.00    Types: Cigarettes    Start date: 02/21/1965  . Smokeless tobacco: Never Used  Substance Use Topics  . Alcohol use: Not Currently  . Drug use: Yes    Types: Marijuana     Family Hx: The patient's family history includes Emphysema in his father; Heart failure in his father.  ROS:   Please see the history of present illness. All other systems reviewed and are negative.   Prior CV studies:   The following studies were reviewed today:  Chest ultrasound 12/22/2018: FINDINGS: There is a very small amount of pleural fluid in the right chest. Right thoracentesis was discussed with the patient.  IMPRESSION: Very small amount of right pleural fluid. The right pleural fluid was amendable for a diagnostic thoracentesis but patient refused the procedure.  Chest CT 12/19/2018: IMPRESSION: 1. Small to moderate size right-sided pleural effusion that appears to be at least partially loculated. 2. Airspace consolidation involving the right middle lobe and right lower lobe concerning for multifocal pneumonia. Follow-up to resolution is recommended as an underlying mass cannot be excluded. 3. There are few scattered 4 mm pulmonary nodules as detailed above. No follow-up needed if patient is low-risk (and has no known or suspected primary neoplasm). Non-contrast chest CT can be  considered in 12 months if patient is high-risk. This recommendation follows the consensus statement: Guidelines for Management of Incidental Pulmonary Nodules Detected on CT Images: From the Fleischner Society 2017; Radiology 2017; 284:228-243. 4. Right-sided thyroid nodule as detailed above. This can be further evaluated with ultrasound as clinically indicated. 5. Hepatic steatosis. 6. Advanced coronary artery calcifications are noted. 7. Dilated main pulmonary artery which can be seen in patients with elevated pulmonary artery pressures.  Echocardiogram 11/08/2018:  1. The left ventricle has a visually estimated ejection fraction of approximately 45%. The cavity size was mildly dilated. Indeterminate diastolic function.  2. Moderate hypokinesis of the left ventricular, mid-apical anteroseptal wall.  3. The right ventricle has normal systolic function. The cavity was normal. There is no increase in right ventricular wall thickness. Right ventricular systolic pressure could not be assessed.  4. The aortic valve is tricuspid. Mild calcification of the aortic valve. Mild aortic annular calcification noted.  5. The mitral valve is grossly normal. There is mild mitral  annular calcification present.  6. The tricuspid valve is grossly normal.  7. The aorta is normal in size and structure.  Labs/Other Tests and Data Reviewed:    EKG:  An ECG dated 12/20/2018 was personally reviewed today and demonstrated:  Sinus rhythm with IVCD and left anterior fascicular block.  Recent Labs: 03/29/2018: TSH 1.080 12/14/2018: ALT 46; B Natriuretic Peptide 78.0 12/21/2018: BUN 53; Creatinine, Ser 1.05; Hemoglobin 18.6; Platelets 173; Potassium 2.8; Sodium 137 12/22/2018: Magnesium 2.7   Recent Lipid Panel Lab Results  Component Value Date/Time   CHOL 181 03/30/2018 05:43 AM   TRIG 322 (H) 03/30/2018 05:43 AM   HDL 26 (L) 03/30/2018 05:43 AM   CHOLHDL 7.0 03/30/2018 05:43 AM   LDLCALC 91 03/30/2018 05:43 AM     Wt Readings from Last 3 Encounters:  01/02/19 278 lb (126.1 kg)  12/22/18 271 lb 14.4 oz (123.3 kg)  12/14/18 (!) 303 lb 3.2 oz (137.5 kg)     Objective:    Vital Signs:  BP (!) 147/76   Pulse 92   Ht 5\' 9"  (1.753 m)   Wt 278 lb (126.1 kg)   BMI 41.05 kg/m    Patient spoke in full sentences, not short of breath. No audible wheezing or coughing. Speech pattern normal.  ASSESSMENT & PLAN:    1.  Chronic combined heart failure that is post recent hospitalization with significant fluid overload and hypoxic respiratory failure.  He underwent diuresis of nearly 30 pounds.  He is currently on Lasix at 20 mg twice daily, I talked with him about appropriate fluid restriction guidelines of less than 2 L a day and also the fact that he can take an additional Lasix dose on days that his weight goes up 2 to 3 pounds.  He states that he has a scale at home and weighs himself daily.  2.  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  Suspect largely nonischemic etiology with cardiac catheterization from December 2019 showing largely branch vessel disease involving a second obtuse marginal that was managed medically and no major obstruction in the larger epicardials.  He continues on Coreg, Entresto, Lasix with potassium supplements, Imdur, aspirin, and Lipitor.  3.  Branch vessel CAD, no active angina at this time.  Continue aspirin and statin.  COVID-19 Education: The signs and symptoms of COVID-19 were discussed with the patient and how to seek care for testing (follow up with PCP or arrange E-visit).  The importance of social distancing was discussed today.  Time:   Today, I have spent 14 minutes with the patient with telehealth technology discussing the above problems.     Medication Adjustments/Labs and Tests Ordered: Current medicines are reviewed at length with the patient today.  Concerns regarding medicines are outlined above.   Tests Ordered: No  orders of the defined types were placed in this encounter.   Medication Changes: No orders of the defined types were placed in this encounter.   Follow Up:  In Person 4 to 6 weeks with [place] in the [place] office.  Signed, Rozann Lesches, MD  01/02/2019 1:16 PM    [facility]

## 2019-01-03 ENCOUNTER — Telehealth: Payer: Self-pay | Admitting: Cardiology

## 2019-01-03 NOTE — Telephone Encounter (Signed)
Patient would like to know which appointment he should keep

## 2019-01-03 NOTE — Telephone Encounter (Signed)
OV scheduled for 01/31/19 with Bernerd Pho in [place] office and also for 02/14/19 with Dr. Domenic Polite here in [place].  Do you have a preference for either for this patient.

## 2019-01-03 NOTE — Telephone Encounter (Signed)
I think it would keep the earlier visit to make sure that he remains clinically stable after his recent hospitalization.

## 2019-01-03 NOTE — Telephone Encounter (Signed)
Patient notified and verbalized understanding.   OV for 02/14/19 cancelled.

## 2019-01-16 DIAGNOSIS — Z79899 Other long term (current) drug therapy: Secondary | ICD-10-CM | POA: Diagnosis not present

## 2019-01-16 DIAGNOSIS — I10 Essential (primary) hypertension: Secondary | ICD-10-CM | POA: Diagnosis not present

## 2019-01-16 DIAGNOSIS — Z6841 Body Mass Index (BMI) 40.0 and over, adult: Secondary | ICD-10-CM | POA: Diagnosis not present

## 2019-01-16 DIAGNOSIS — M940 Chondrocostal junction syndrome [Tietze]: Secondary | ICD-10-CM | POA: Diagnosis not present

## 2019-01-16 DIAGNOSIS — Z299 Encounter for prophylactic measures, unspecified: Secondary | ICD-10-CM | POA: Diagnosis not present

## 2019-01-16 DIAGNOSIS — M109 Gout, unspecified: Secondary | ICD-10-CM | POA: Diagnosis not present

## 2019-01-31 ENCOUNTER — Other Ambulatory Visit: Payer: Self-pay

## 2019-01-31 ENCOUNTER — Encounter: Payer: Self-pay | Admitting: Student

## 2019-01-31 ENCOUNTER — Ambulatory Visit (INDEPENDENT_AMBULATORY_CARE_PROVIDER_SITE_OTHER): Payer: Medicare HMO | Admitting: Student

## 2019-01-31 VITALS — BP 136/71 | HR 99 | Temp 97.5°F | Ht 69.0 in | Wt 288.0 lb

## 2019-01-31 DIAGNOSIS — E876 Hypokalemia: Secondary | ICD-10-CM

## 2019-01-31 DIAGNOSIS — I10 Essential (primary) hypertension: Secondary | ICD-10-CM | POA: Diagnosis not present

## 2019-01-31 DIAGNOSIS — I5042 Chronic combined systolic (congestive) and diastolic (congestive) heart failure: Secondary | ICD-10-CM

## 2019-01-31 DIAGNOSIS — I110 Hypertensive heart disease with heart failure: Secondary | ICD-10-CM | POA: Diagnosis not present

## 2019-01-31 DIAGNOSIS — I2510 Atherosclerotic heart disease of native coronary artery without angina pectoris: Secondary | ICD-10-CM | POA: Diagnosis not present

## 2019-01-31 DIAGNOSIS — E785 Hyperlipidemia, unspecified: Secondary | ICD-10-CM | POA: Diagnosis not present

## 2019-01-31 MED ORDER — ENTRESTO 24-26 MG PO TABS: 1.0000 | ORAL_TABLET | Freq: Two times a day (BID) | ORAL | 0 refills | Status: DC

## 2019-01-31 NOTE — Patient Instructions (Signed)
Medication Instructions:  Your physician recommends that you continue on your current medications as directed. Please refer to the Current Medication list given to you today.   Labwork: BMET NEEDED WHEN YOU HAVE LAB WORK DONE WITH PCP   Testing/Procedures: NONE  Follow-Up: Your physician recommends that you schedule a follow-up appointment in: 3-4 MONTHS    Any Other Special Instructions Will Be Listed Below (If Applicable).     If you need a refill on your cardiac medications before your next appointment, please call your pharmacy.

## 2019-01-31 NOTE — Progress Notes (Signed)
Cardiology Office Note    Date:  01/31/2019   ID:  Thomas [name], DOB 06-21-1955, MRN [id number]  PCP:  Monico Blitz, MD  Cardiologist: Rozann Lesches, MD    Chief Complaint  Patient presents with  . Follow-up    62-month visit    History of Present Illness:    Thomas [name] is a 63 y.o. male with past medical history of CAD (s/p cath in 03/2018 showing 90% OM2 stenosis and nonobstructive CAD along LAD and LCx with medical management recommended), NICM (EF reported as 20-25% by echo in 03/2018, at 45% by repeat imaging in 10/2018), HTN, HLD and Type 2 DM who presents to the office today for a 62-month follow-up.  He most recently had a telehealth visit with Dr. Domenic Polite last month following a hospitalization for an acute CHF exacerbation during which his weight declined by over 28 pounds. Was overall feeling well at the time of his follow-up visit and said that his breathing was back to baseline. Weight was recorded as 278 lbs and he was continued on his current medication regimen including Coreg, Entresto, Lasix, Imdur, ASA, Plavix and Lipitor.  In talking with the patient today, he reports overall doing well since his last visit. He says his breathing has been at baseline and he denies any recurrent dyspnea on exertion.  No recent chest pain or palpitations.  He denies any orthopnea, PND or lower extremity edema.  He has been weighing himself regularly and says that weight has overall been stable at 275 - 278 lbs on his home scales (at 288 lbs on the office scales today).   He reports good compliance with his current medication regimen and denies any noted side effects. He does not check his blood pressure regularly but it is well controlled at 136/71 during today's visit.  While briefly discussing COVID-19, he says that he has been going out in public regularly and does not wear a mask as he feels like he is suffocating with this. He also says he is not practicing social distancing  and we reviewed the importance of these measures as he is high-risk given his multiple medical issues.   Past Medical History:  Diagnosis Date  . Morbid obesity ([place])   . NSTEMI (non-ST elevated myocardial infarction) ([place])    a. s/p cath in 03/2018 showing 90% OM2 stenosis and nonobstructive CAD along LAD and LCx with medical management recommended  . Secondary cardiomyopathy ([place])    a. EF reported as 20-25% by echo in 03/2018 b. EF at 45% by repeat imaging in 10/2018  . Snoring   . Tobacco abuse   . Type 2 diabetes mellitus ([place])     Past Surgical History:  Procedure Laterality Date  . LEFT HEART CATH AND CORONARY ANGIOGRAPHY N/A 03/30/2018   Procedure: LEFT HEART CATH AND CORONARY ANGIOGRAPHY;  Surgeon: Troy Sine, MD;  Location: [facility];  Service: Cardiovascular;  Laterality: N/A;    Current Medications: Outpatient Medications Prior to Visit  Medication Sig Dispense Refill  . alum & mag hydroxide-simeth (MAALOX/MYLANTA) 200-200-20 MG/5ML suspension Take 30 mLs by mouth every 2 (two) hours as needed for indigestion. 355 mL 0  . aspirin EC 81 MG tablet Take 81 mg by mouth daily.    Marland Kitchen atorvastatin (LIPITOR) 80 MG tablet Take 1 tablet (80 mg total) by mouth daily at 6 PM. 90 tablet 1  . canagliflozin (INVOKANA) 300 MG TABS tablet Take 300 mg by mouth daily before  breakfast.    . carvedilol (COREG) 12.5 MG tablet Take 1 tablet (12.5 mg total) by mouth 2 (two) times daily. 180 tablet 3  . clopidogrel (PLAVIX) 75 MG tablet Take 1 tablet (75 mg total) by mouth daily with breakfast. 90 tablet 3  . furosemide (LASIX) 20 MG tablet Take 1 tablet (20 mg total) by mouth 2 (two) times daily. 90 tablet 0  . isosorbide mononitrate (IMDUR) 30 MG 24 hr tablet Take 1 tablet (30 mg total) by mouth daily. 90 tablet 1  . metFORMIN (GLUCOPHAGE-XR) 500 MG 24 hr tablet Take 1,000 mg by mouth 2 (two) times daily.    . nitroGLYCERIN (NITROSTAT) 0.4 MG SL tablet Place 1 tablet (0.4 mg total)  under the tongue every 5 (five) minutes as needed for chest pain. 25 tablet 0  . omeprazole (PRILOSEC) 40 MG capsule Take 40 mg by mouth daily.    . potassium chloride (K-DUR) 10 MEQ tablet Take 1 tablet (10 mEq total) by mouth daily. 90 tablet 1  . sacubitril-valsartan (ENTRESTO) 24-26 MG Take 0.5 tablets by mouth 2 (two) times daily. 56 tablet 0   No facility-administered medications prior to visit.      Allergies:   Codeine and Doxycycline   Social History   Socioeconomic History  . Marital status: Married    Spouse name: Not on file  . Number of children: Not on file  . Years of education: Not on file  . Highest education level: Not on file  Occupational History  . Not on file  Social Needs  . Financial resource strain: Not on file  . Food insecurity    Worry: Not on file    Inability: Not on file  . Transportation needs    Medical: Not on file    Non-medical: Not on file  Tobacco Use  . Smoking status: Current Every Day Smoker    Packs/day: 0.25    Types: Cigarettes    Start date: 02/21/1965  . Smokeless tobacco: Never Used  Substance and Sexual Activity  . Alcohol use: Not Currently  . Drug use: Yes    Types: Marijuana  . Sexual activity: Not on file  Lifestyle  . Physical activity    Days per week: Not on file    Minutes per session: Not on file  . Stress: Not on file  Relationships  . Social Herbalist on phone: Not on file    Gets together: Not on file    Attends religious service: Not on file    Active member of club or organization: Not on file    Attends meetings of clubs or organizations: Not on file    Relationship status: Not on file  Other Topics Concern  . Not on file  Social History Narrative  . Not on file     Family History:  The patient's family history includes Emphysema in his father; Heart failure in his father.   Review of Systems:   Please see the history of present illness.     General:  No chills, fever, night sweats  or weight changes.  Cardiovascular:  No chest pain, dyspnea on exertion, edema, orthopnea, palpitations, paroxysmal nocturnal dyspnea. Dermatological: No rash, lesions/masses Respiratory: No cough, dyspnea Urologic: No hematuria, dysuria Abdominal:   No nausea, vomiting, diarrhea, bright red blood per rectum, melena, or hematemesis Neurologic:  No visual changes, wkns, changes in mental status.  He denies any of the above symptoms.   All other systems  reviewed and are otherwise negative except as noted above.   Physical Exam:    VS:  BP 136/71 (BP Location: Right Arm)   Pulse 99   Temp (!) 97.5 F (36.4 C) (Temporal)   Ht 5\' 9"  (1.753 m)   Wt 288 lb (130.6 kg)   BMI 42.53 kg/m    General: Well developed, well nourished,male appearing in no acute distress. Head: Normocephalic, atraumatic, sclera non-icteric, no xanthomas, nares are without discharge.  Neck: No carotid bruits. JVD not elevated.  Lungs: Respirations regular and unlabored, without wheezes or rales.  Heart: Regular rate and rhythm. No S3 or S4.  No murmur, no rubs, or gallops appreciated. Abdomen: Soft, non-tender, non-distended with normoactive bowel sounds. No hepatomegaly. No rebound/guarding. No obvious abdominal masses. Msk:  Strength and tone appear normal for age. No joint deformities or effusions. Extremities: No clubbing or cyanosis. No lower extremity edema.  Distal pedal pulses are 2+ bilaterally. Neuro: Alert and oriented X 3. Moves all extremities spontaneously. No focal deficits noted. Psych:  Responds to questions appropriately with a normal affect. Skin: No rashes or lesions noted  Wt Readings from Last 3 Encounters:  01/31/19 288 lb (130.6 kg)  01/02/19 278 lb (126.1 kg)  12/22/18 271 lb 14.4 oz (123.3 kg)     Studies/Labs Reviewed:   EKG:  EKG is not ordered today.   Recent Labs: 03/29/2018: TSH 1.080 12/14/2018: ALT 46; B Natriuretic Peptide 78.0 12/21/2018: BUN 53; Creatinine, Ser 1.05;  Hemoglobin 18.6; Platelets 173; Potassium 2.8; Sodium 137 12/22/2018: Magnesium 2.7   Lipid Panel    Component Value Date/Time   CHOL 181 03/30/2018 0543   TRIG 322 (H) 03/30/2018 0543   HDL 26 (L) 03/30/2018 0543   CHOLHDL 7.0 03/30/2018 0543   VLDL 64 (H) 03/30/2018 0543   LDLCALC 91 03/30/2018 0543    Additional studies/ records that were reviewed today include:   Cardiac Catheterization: 03/30/2018  Prox Cx lesion is 30% stenosed.  Ost 2nd Mrg to 2nd Mrg lesion is 90% stenosed.  2nd Mrg lesion is 90% stenosed.  Prox LAD lesion is 30% stenosed.   Moderate global LV dysfunction with an ejection fraction of 35 to 40%.  LVEDP 13 mm.  There is evidence for coronary calcification.  The LAD has 30% proximal stenosis.  The Circumflex vessel has mild 30% narrowing prior to giving off 3 marginal vessels and the AV groove circumflex.  The second marginal branch has diffuse disease with 90% near ostial to proximal stenosis and diffuse 90% mid stenoses.  The RCA is a normal dominant vessel.  RECOMMENDATION: Due to the patient's large body habitus, visualization of vessels was difficult.  Recommend an initial aggressive medical therapy regimen with high potency statin therapy with target LDL less than 70, beta-blocker, amlodipine, in addition to nitrate therapy.  Since cardiac enzymes are mildly positive consistent with a NSTEMI recommend initial dual antiplatelet therapy with aspirin/Plavix even though stenting was not performed.  If patient fails medical therapy consider PCI to the diffusely diseased second marginal branch of the circumflex.  Smoking cessation is imperative.  Echocardiogram: 11/08/2018 IMPRESSIONS    1. The left ventricle has a visually estimated ejection fraction of approximately 45%. The cavity size was mildly dilated. Indeterminate diastolic function.  2. Moderate hypokinesis of the left ventricular, mid-apical anteroseptal wall.  3. The right ventricle has  normal systolic function. The cavity was normal. There is no increase in right ventricular wall thickness. Right ventricular systolic pressure could not be  assessed.  4. The aortic valve is tricuspid. Mild calcification of the aortic valve. Mild aortic annular calcification noted.  5. The mitral valve is grossly normal. There is mild mitral annular calcification present.  6. The tricuspid valve is grossly normal.  7. The aorta is normal in size and structure.  Assessment:    1. Chronic combined systolic and diastolic heart failure ([place])   2. Coronary artery disease involving native coronary artery of native heart without angina pectoris   3. Essential hypertension   4. Hyperlipidemia LDL goal <70   5. Hypokalemia      Plan:   In order of problems listed above:  1. Chronic Combined Systolic and Diastolic CHF - EF reported as 20-25% by echo in 03/2018, at 45% by repeat imaging in 10/2018. He denies any recent dyspnea on exertion, orthopnea, PND or lower extremity edema. Weight has overall been stable on his home scales. - Continue current medication regimen with Coreg 12.5 mg twice daily, Entresto 24-26mg  BID, Lasix 20mg  BID and Imdur 30mg  daily. He reports having cut his Entresto tablets in half prior to his recent admission but is now taking full tablets. I advised him against cutting them in half given that it is a combination tablet. Would consider further titration of Entresto in the future, but he needs follow-up labs and refuses to have them performed at this time.   2. CAD - s/p cath in 03/2018 showing 90% OM2 stenosis and nonobstructive CAD along LAD and LCx with medical management recommended as outlined above.  - He denies any recent chest pain or dyspnea on exertion. Continue ASA, Plavix, beta-blocker and statin therapy. Suspect that Plavix could be discontinued at the time of his next follow-up visit given that he will be a year out from his NSTEMI at that time.   3. HTN - BP  is well controlled at 136/71 during today's visit. Continue current medication regimen.  4. HLD - followed by PCP. No recent labs available for review. LDL was at 91 in 03/2018. Will request a copy of most recent labs once available. Continue Atorvastatin 80 mg daily.  5. Hypokalemia - K+ was 2.8 at the time of most recent labs in 12/2018. I recommended that he have a repeat BMET today to assess electrolytes and renal function but he declined. He says that he has upcoming labs scheduled with his PCP next week but is "unsure if he will keep this appointment as he hates getting stuck with needles".  Was encouraged to keep scheduled follow-up and the risk of electrolyte disturbances was reviewed with the patient.   Medication Adjustments/Labs and Tests Ordered: Current medicines are reviewed at length with the patient today.  Concerns regarding medicines are outlined above.  Medication changes, Labs and Tests ordered today are listed in the Patient Instructions below. Patient Instructions  Medication Instructions:  Your physician recommends that you continue on your current medications as directed. Please refer to the Current Medication list given to you today.   Labwork: BMET NEEDED WHEN YOU HAVE LAB WORK DONE WITH PCP   Testing/Procedures: NONE  Follow-Up: Your physician recommends that you schedule a follow-up appointment in: 3-4 MONTHS    Any Other Special Instructions Will Be Listed Below (If Applicable).  If you need a refill on your cardiac medications before your next appointment, please call your pharmacy.    Signed, Erma Heritage, PA-C  01/31/2019 4:51 PM    [facility] [address],  [place] [address] Phone: [phone] Fax: [phone]

## 2019-02-04 DIAGNOSIS — I10 Essential (primary) hypertension: Secondary | ICD-10-CM | POA: Diagnosis not present

## 2019-02-04 DIAGNOSIS — I7389 Other specified peripheral vascular diseases: Secondary | ICD-10-CM | POA: Diagnosis not present

## 2019-02-04 DIAGNOSIS — Z2821 Immunization not carried out because of patient refusal: Secondary | ICD-10-CM | POA: Diagnosis not present

## 2019-02-04 DIAGNOSIS — Z6841 Body Mass Index (BMI) 40.0 and over, adult: Secondary | ICD-10-CM | POA: Diagnosis not present

## 2019-02-04 DIAGNOSIS — I700 Atherosclerosis of aorta: Secondary | ICD-10-CM | POA: Diagnosis not present

## 2019-02-04 DIAGNOSIS — F17210 Nicotine dependence, cigarettes, uncomplicated: Secondary | ICD-10-CM | POA: Diagnosis not present

## 2019-02-04 DIAGNOSIS — I5030 Unspecified diastolic (congestive) heart failure: Secondary | ICD-10-CM | POA: Diagnosis not present

## 2019-02-04 DIAGNOSIS — H6690 Otitis media, unspecified, unspecified ear: Secondary | ICD-10-CM | POA: Diagnosis not present

## 2019-02-04 DIAGNOSIS — Z299 Encounter for prophylactic measures, unspecified: Secondary | ICD-10-CM | POA: Diagnosis not present

## 2019-02-14 ENCOUNTER — Ambulatory Visit: Payer: Medicare HMO | Admitting: Cardiology

## 2019-02-18 DIAGNOSIS — R5383 Other fatigue: Secondary | ICD-10-CM | POA: Diagnosis not present

## 2019-02-18 DIAGNOSIS — Z125 Encounter for screening for malignant neoplasm of prostate: Secondary | ICD-10-CM | POA: Diagnosis not present

## 2019-02-18 DIAGNOSIS — Z0000 Encounter for general adult medical examination without abnormal findings: Secondary | ICD-10-CM | POA: Diagnosis not present

## 2019-02-18 DIAGNOSIS — Z6841 Body Mass Index (BMI) 40.0 and over, adult: Secondary | ICD-10-CM | POA: Diagnosis not present

## 2019-02-18 DIAGNOSIS — Z1339 Encounter for screening examination for other mental health and behavioral disorders: Secondary | ICD-10-CM | POA: Diagnosis not present

## 2019-02-18 DIAGNOSIS — Z79899 Other long term (current) drug therapy: Secondary | ICD-10-CM | POA: Diagnosis not present

## 2019-02-18 DIAGNOSIS — I10 Essential (primary) hypertension: Secondary | ICD-10-CM | POA: Diagnosis not present

## 2019-02-18 DIAGNOSIS — Z299 Encounter for prophylactic measures, unspecified: Secondary | ICD-10-CM | POA: Diagnosis not present

## 2019-02-18 DIAGNOSIS — Z1211 Encounter for screening for malignant neoplasm of colon: Secondary | ICD-10-CM | POA: Diagnosis not present

## 2019-02-18 DIAGNOSIS — Z7189 Other specified counseling: Secondary | ICD-10-CM | POA: Diagnosis not present

## 2019-02-18 DIAGNOSIS — I5030 Unspecified diastolic (congestive) heart failure: Secondary | ICD-10-CM | POA: Diagnosis not present

## 2019-02-18 DIAGNOSIS — E7800 Pure hypercholesterolemia, unspecified: Secondary | ICD-10-CM | POA: Diagnosis not present

## 2019-02-18 DIAGNOSIS — Z1331 Encounter for screening for depression: Secondary | ICD-10-CM | POA: Diagnosis not present

## 2019-02-20 DIAGNOSIS — Z6841 Body Mass Index (BMI) 40.0 and over, adult: Secondary | ICD-10-CM | POA: Diagnosis not present

## 2019-02-20 DIAGNOSIS — R972 Elevated prostate specific antigen [PSA]: Secondary | ICD-10-CM | POA: Diagnosis not present

## 2019-02-20 DIAGNOSIS — F17210 Nicotine dependence, cigarettes, uncomplicated: Secondary | ICD-10-CM | POA: Diagnosis not present

## 2019-02-20 DIAGNOSIS — I5030 Unspecified diastolic (congestive) heart failure: Secondary | ICD-10-CM | POA: Diagnosis not present

## 2019-02-20 DIAGNOSIS — Z299 Encounter for prophylactic measures, unspecified: Secondary | ICD-10-CM | POA: Diagnosis not present

## 2019-02-20 DIAGNOSIS — I10 Essential (primary) hypertension: Secondary | ICD-10-CM | POA: Diagnosis not present

## 2019-03-06 ENCOUNTER — Other Ambulatory Visit: Payer: Self-pay | Admitting: *Deleted

## 2019-03-06 MED ORDER — FUROSEMIDE 20 MG PO TABS: 20.0000 mg | ORAL_TABLET | Freq: Two times a day (BID) | ORAL | 1 refills | Status: DC

## 2019-03-19 ENCOUNTER — Telehealth: Payer: Self-pay | Admitting: Cardiology

## 2019-03-19 ENCOUNTER — Encounter: Payer: Self-pay | Admitting: *Deleted

## 2019-03-19 DIAGNOSIS — R972 Elevated prostate specific antigen [PSA]: Secondary | ICD-10-CM | POA: Diagnosis not present

## 2019-03-19 NOTE — Telephone Encounter (Signed)
Patient called stating that he has papers sent to him about medication

## 2019-03-19 NOTE — Telephone Encounter (Signed)
Pt received application from [employer] for [place] - aware to bring all info for the pt portion of application to our office and we would complete provider section and submit for him

## 2019-03-28 DIAGNOSIS — I700 Atherosclerosis of aorta: Secondary | ICD-10-CM | POA: Diagnosis not present

## 2019-03-28 DIAGNOSIS — I5030 Unspecified diastolic (congestive) heart failure: Secondary | ICD-10-CM | POA: Diagnosis not present

## 2019-03-28 DIAGNOSIS — E1165 Type 2 diabetes mellitus with hyperglycemia: Secondary | ICD-10-CM | POA: Diagnosis not present

## 2019-03-28 DIAGNOSIS — F17210 Nicotine dependence, cigarettes, uncomplicated: Secondary | ICD-10-CM | POA: Diagnosis not present

## 2019-03-28 DIAGNOSIS — I10 Essential (primary) hypertension: Secondary | ICD-10-CM | POA: Diagnosis not present

## 2019-03-28 DIAGNOSIS — Z299 Encounter for prophylactic measures, unspecified: Secondary | ICD-10-CM | POA: Diagnosis not present

## 2019-03-28 DIAGNOSIS — Z6841 Body Mass Index (BMI) 40.0 and over, adult: Secondary | ICD-10-CM | POA: Diagnosis not present

## 2019-04-02 ENCOUNTER — Other Ambulatory Visit: Payer: Self-pay | Admitting: Cardiology

## 2019-04-05 ENCOUNTER — Other Ambulatory Visit: Payer: Self-pay | Admitting: Cardiology

## 2019-05-06 ENCOUNTER — Encounter: Payer: Self-pay | Admitting: Cardiology

## 2019-05-06 NOTE — Progress Notes (Signed)
Cardiology Office Note  Date: 05/07/2019   ID: Thomas [name], DOB [birth year], MRN [id number]  PCP:  Monico Blitz, MD  Cardiologist:  Rozann Lesches, MD Electrophysiologist:  None   Chief Complaint  Patient presents with  . Cardiac follow-up    History of Present Illness: Thomas [name] is a 64 y.o. male last seen in October 2020 by Ms. Strader PA-C.  He presents for a routine visit.  He tells me that overall he has been doing reasonably well from a cardiac perspective, no angina symptoms or nitroglycerin use, improved leg edema.  His weight has been creeping up but he attributes this to his diet.  He states that he is having good urine output on current diuretic regimen.  I reviewed his lab work from November 2020 as outlined below.  Current cardiac medications include aspirin, Lipitor, Coreg, Plavix, Lasix, Imdur, potassium supplements, and Entresto.  Past Medical History:  Diagnosis Date  . Morbid obesity ([place])   . NSTEMI (non-ST elevated myocardial infarction) ([place])    a. s/p cath in 03/2018 showing 90% OM2 stenosis and nonobstructive CAD along LAD and LCx with medical management recommended  . Secondary cardiomyopathy ([place])    a. EF reported as 20-25% by echo in 03/2018 b. EF at 45% by repeat imaging in 10/2018  . Snoring   . Type 2 diabetes mellitus ([place])     Past Surgical History:  Procedure Laterality Date  . LEFT HEART CATH AND CORONARY ANGIOGRAPHY N/A 03/30/2018   Procedure: LEFT HEART CATH AND CORONARY ANGIOGRAPHY;  Surgeon: Troy Sine, MD;  Location: [facility];  Service: Cardiovascular;  Laterality: N/A;    Current Outpatient Medications  Medication Sig Dispense Refill  . alum & mag hydroxide-simeth (MAALOX/MYLANTA) 200-200-20 MG/5ML suspension Take 30 mLs by mouth every 2 (two) hours as needed for indigestion. 355 mL 0  . aspirin EC 81 MG tablet Take 81 mg by mouth daily.    Marland Kitchen atorvastatin (LIPITOR) 80 MG tablet TAKE 1 TABLET (80 MG TOTAL) BY  MOUTH DAILY AT 6 PM. 90 tablet 3  . canagliflozin (INVOKANA) 300 MG TABS tablet Take 300 mg by mouth daily before breakfast.    . carvedilol (COREG) 12.5 MG tablet Take 1 tablet (12.5 mg total) by mouth 2 (two) times daily. 180 tablet 3  . clopidogrel (PLAVIX) 75 MG tablet Take 1 tablet (75 mg total) by mouth daily with breakfast. 90 tablet 3  . furosemide (LASIX) 20 MG tablet Take 1 tablet (20 mg total) by mouth 2 (two) times daily. 180 tablet 1  . isosorbide mononitrate (IMDUR) 30 MG 24 hr tablet TAKE 1 TABLET BY MOUTH EVERY DAY 90 tablet 1  . metFORMIN (GLUCOPHAGE-XR) 500 MG 24 hr tablet Take 1,000 mg by mouth 2 (two) times daily.    . nitroGLYCERIN (NITROSTAT) 0.4 MG SL tablet Place 1 tablet (0.4 mg total) under the tongue every 5 (five) minutes as needed for chest pain. 25 tablet 0  . omeprazole (PRILOSEC) 40 MG capsule Take 40 mg by mouth daily.    . sacubitril-valsartan (ENTRESTO) 24-26 MG Take 1 tablet by mouth 2 (two) times daily. 56 tablet 0  . potassium chloride (K-DUR) 10 MEQ tablet Take 1 tablet (10 mEq total) by mouth daily. 90 tablet 1   No current facility-administered medications for this visit.   Allergies:  Codeine and Doxycycline   Social History: The patient  reports that he has been smoking cigarettes. He started smoking about 54  years ago. He has been smoking about 0.25 packs per day. He has never used smokeless tobacco. He reports previous alcohol use. He reports current drug use. Drug: Marijuana.   ROS:  Please see the history of present illness. Otherwise, complete review of systems is positive for constipation and chronic intermittent abdominal pain.  All other systems are reviewed and negative.   Physical Exam: VS:  BP (!) 148/64   Pulse 85   Ht 5\' 9"  (1.753 m)   Wt 290 lb (131.5 kg)   SpO2 92%   BMI 42.83 kg/m , BMI Body mass index is 42.83 kg/m.  Wt Readings from Last 3 Encounters:  05/07/19 290 lb (131.5 kg)  01/31/19 288 lb (130.6 kg)  01/02/19 278 lb  (126.1 kg)    General: Morbidly obese male, no distress. HEENT: Conjunctiva and lids normal, wearing a mask. Neck: Supple, no elevated JVP or carotid bruits, no thyromegaly. Lungs: Decreased breath sounds without crackles, nonlabored breathing at rest. Cardiac: Regular rate and rhythm, no S3 or significant systolic murmur. Abdomen: Protuberant, bowel sounds present. Extremities: Mild lower leg edema, distal pulses 2+. Skin: Warm and dry. Musculoskeletal: No kyphosis. Neuropsychiatric: Alert and oriented x3, affect grossly appropriate.  ECG:  An ECG dated 12/20/2018 was personally reviewed today and demonstrated:  Sinus rhythm with borderline prolonged PR interval, rightward axis, poor anterior R wave progression.  Recent Labwork: 12/14/2018: ALT 46; AST 26; B Natriuretic Peptide 78.0 12/21/2018: BUN 53; Creatinine, Ser 1.05; Hemoglobin 18.6; Platelets 173; Potassium 2.8; Sodium 137 12/22/2018: Magnesium 2.7     Component Value Date/Time   CHOL 181 03/30/2018 0543   TRIG 322 (H) 03/30/2018 0543   HDL 26 (L) 03/30/2018 0543   CHOLHDL 7.0 03/30/2018 0543   VLDL 64 (H) 03/30/2018 0543   LDLCALC 91 03/30/2018 0543  November 2020: Hemoglobin 13.3, platelets 148 TSH 2.49, cholesterol 85, triglycerides 133, HDL 35, LDL 27, BUN 15, creatinine 0.86, potassium 3.8, AST 8, ALT 7  Other Studies Reviewed Today:  Echocardiogram 11/08/2018:  1. The left ventricle has a visually estimated ejection fraction of approximately 45%. The cavity size was mildly dilated. Indeterminate diastolic function.  2. Moderate hypokinesis of the left ventricular, mid-apical anteroseptal wall.  3. The right ventricle has normal systolic function. The cavity was normal. There is no increase in right ventricular wall thickness. Right ventricular systolic pressure could not be assessed.  4. The aortic valve is tricuspid. Mild calcification of the aortic valve. Mild aortic annular calcification noted.  5. The mitral valve is  grossly normal. There is mild mitral annular calcification present.  6. The tricuspid valve is grossly normal.  7. The aorta is normal in size and structure.  Assessment and Plan:  1.  Chronic combined heart failure with LVEF 45%.  Weight is creeping back up but he does attribute some of this to diet, I have talked with him about intermittent intensification of Lasix, we may even need to up his standard dose.  He does report good urine output on current regimen and last creatinine was 0.86.  Continue Coreg, Entresto, and Lasix with potassium supplements.  2.  Branch vessel CAD being managed medically.  He continues on aspirin, Plavix, and statin therapy.  No active angina at this time.  Last LDL 91.  Medication Adjustments/Labs and Tests Ordered: Current medicines are reviewed at length with the patient today.  Concerns regarding medicines are outlined above.   Tests Ordered: No orders of the defined types were placed in this  encounter.   Medication Changes: No orders of the defined types were placed in this encounter.   Disposition:  Follow up 4 months in the [place] office.  Signed, Satira Sark, MD, [facility] 05/07/2019 2:14 PM    [facility] [place], [place], [place] [address] Phone: [phone]; Fax: [phone]

## 2019-05-07 ENCOUNTER — Ambulatory Visit (INDEPENDENT_AMBULATORY_CARE_PROVIDER_SITE_OTHER): Payer: Medicare HMO | Admitting: Cardiology

## 2019-05-07 ENCOUNTER — Encounter: Payer: Self-pay | Admitting: Cardiology

## 2019-05-07 ENCOUNTER — Other Ambulatory Visit: Payer: Self-pay

## 2019-05-07 VITALS — BP 148/64 | HR 85 | Ht 69.0 in | Wt 290.0 lb

## 2019-05-07 DIAGNOSIS — I10 Essential (primary) hypertension: Secondary | ICD-10-CM | POA: Diagnosis not present

## 2019-05-07 DIAGNOSIS — I5042 Chronic combined systolic (congestive) and diastolic (congestive) heart failure: Secondary | ICD-10-CM | POA: Diagnosis not present

## 2019-05-07 DIAGNOSIS — I429 Cardiomyopathy, unspecified: Secondary | ICD-10-CM

## 2019-05-07 DIAGNOSIS — I2510 Atherosclerotic heart disease of native coronary artery without angina pectoris: Secondary | ICD-10-CM

## 2019-05-07 NOTE — Patient Instructions (Addendum)
Medication Instructions:    Your physician recommends that you continue on your current medications as directed. Please refer to the Current Medication list given to you today.  Labwork:  NONE  Testing/Procedures:  NONE  Follow-Up:  Your physician recommends that you schedule a follow-up appointment in: 4 months (office).  Any Other Special Instructions Will Be Listed Below (If Applicable).  Your patient assistance application for entresto has been sent and received by [employer]. It is currently in process for determination.  Please call [employer] at [phone] to request your last refill that's left on your previous order.   If you need a refill on your cardiac medications before your next appointment, please call your pharmacy.

## 2019-05-21 ENCOUNTER — Encounter: Payer: Self-pay | Admitting: *Deleted

## 2019-05-21 NOTE — Progress Notes (Signed)
Notification received via fax from [facility] that [employer] approved for the remainder of the calendar year.

## 2019-06-05 DIAGNOSIS — M549 Dorsalgia, unspecified: Secondary | ICD-10-CM | POA: Diagnosis not present

## 2019-06-05 DIAGNOSIS — I7389 Other specified peripheral vascular diseases: Secondary | ICD-10-CM | POA: Diagnosis not present

## 2019-06-05 DIAGNOSIS — Z6841 Body Mass Index (BMI) 40.0 and over, adult: Secondary | ICD-10-CM | POA: Diagnosis not present

## 2019-06-05 DIAGNOSIS — Z299 Encounter for prophylactic measures, unspecified: Secondary | ICD-10-CM | POA: Diagnosis not present

## 2019-06-05 DIAGNOSIS — E1165 Type 2 diabetes mellitus with hyperglycemia: Secondary | ICD-10-CM | POA: Diagnosis not present

## 2019-06-05 DIAGNOSIS — F17210 Nicotine dependence, cigarettes, uncomplicated: Secondary | ICD-10-CM | POA: Diagnosis not present

## 2019-06-05 DIAGNOSIS — I10 Essential (primary) hypertension: Secondary | ICD-10-CM | POA: Diagnosis not present

## 2019-06-15 ENCOUNTER — Other Ambulatory Visit: Payer: Self-pay | Admitting: Cardiology

## 2019-06-16 DIAGNOSIS — E7800 Pure hypercholesterolemia, unspecified: Secondary | ICD-10-CM | POA: Diagnosis not present

## 2019-06-16 DIAGNOSIS — I10 Essential (primary) hypertension: Secondary | ICD-10-CM | POA: Diagnosis not present

## 2019-07-03 DIAGNOSIS — E1165 Type 2 diabetes mellitus with hyperglycemia: Secondary | ICD-10-CM | POA: Diagnosis not present

## 2019-07-03 DIAGNOSIS — Z299 Encounter for prophylactic measures, unspecified: Secondary | ICD-10-CM | POA: Diagnosis not present

## 2019-07-03 DIAGNOSIS — I700 Atherosclerosis of aorta: Secondary | ICD-10-CM | POA: Diagnosis not present

## 2019-07-03 DIAGNOSIS — F17210 Nicotine dependence, cigarettes, uncomplicated: Secondary | ICD-10-CM | POA: Diagnosis not present

## 2019-07-03 DIAGNOSIS — I5030 Unspecified diastolic (congestive) heart failure: Secondary | ICD-10-CM | POA: Diagnosis not present

## 2019-07-03 DIAGNOSIS — Z6841 Body Mass Index (BMI) 40.0 and over, adult: Secondary | ICD-10-CM | POA: Diagnosis not present

## 2019-07-03 DIAGNOSIS — I10 Essential (primary) hypertension: Secondary | ICD-10-CM | POA: Diagnosis not present

## 2019-07-09 DIAGNOSIS — E1159 Type 2 diabetes mellitus with other circulatory complications: Secondary | ICD-10-CM | POA: Diagnosis not present

## 2019-07-09 DIAGNOSIS — E1140 Type 2 diabetes mellitus with diabetic neuropathy, unspecified: Secondary | ICD-10-CM | POA: Diagnosis not present

## 2019-07-26 DIAGNOSIS — H52209 Unspecified astigmatism, unspecified eye: Secondary | ICD-10-CM | POA: Diagnosis not present

## 2019-07-26 DIAGNOSIS — H5213 Myopia, bilateral: Secondary | ICD-10-CM | POA: Diagnosis not present

## 2019-08-13 DIAGNOSIS — E785 Hyperlipidemia, unspecified: Secondary | ICD-10-CM | POA: Diagnosis not present

## 2019-08-13 DIAGNOSIS — Z79899 Other long term (current) drug therapy: Secondary | ICD-10-CM | POA: Diagnosis not present

## 2019-08-13 DIAGNOSIS — E1151 Type 2 diabetes mellitus with diabetic peripheral angiopathy without gangrene: Secondary | ICD-10-CM | POA: Diagnosis not present

## 2019-08-13 DIAGNOSIS — Z720 Tobacco use: Secondary | ICD-10-CM | POA: Diagnosis not present

## 2019-08-13 DIAGNOSIS — K219 Gastro-esophageal reflux disease without esophagitis: Secondary | ICD-10-CM | POA: Diagnosis not present

## 2019-08-13 DIAGNOSIS — E119 Type 2 diabetes mellitus without complications: Secondary | ICD-10-CM | POA: Diagnosis not present

## 2019-08-16 DIAGNOSIS — E1165 Type 2 diabetes mellitus with hyperglycemia: Secondary | ICD-10-CM | POA: Diagnosis not present

## 2019-09-05 ENCOUNTER — Encounter: Payer: Self-pay | Admitting: Cardiology

## 2019-09-05 ENCOUNTER — Other Ambulatory Visit: Payer: Self-pay

## 2019-09-05 ENCOUNTER — Ambulatory Visit (INDEPENDENT_AMBULATORY_CARE_PROVIDER_SITE_OTHER): Payer: Medicare HMO | Admitting: Cardiology

## 2019-09-05 ENCOUNTER — Encounter: Payer: Self-pay | Admitting: *Deleted

## 2019-09-05 VITALS — BP 118/54 | HR 98 | Ht 69.0 in | Wt 294.0 lb

## 2019-09-05 DIAGNOSIS — I5042 Chronic combined systolic (congestive) and diastolic (congestive) heart failure: Secondary | ICD-10-CM | POA: Diagnosis not present

## 2019-09-05 DIAGNOSIS — I25119 Atherosclerotic heart disease of native coronary artery with unspecified angina pectoris: Secondary | ICD-10-CM

## 2019-09-05 DIAGNOSIS — I429 Cardiomyopathy, unspecified: Secondary | ICD-10-CM | POA: Diagnosis not present

## 2019-09-05 NOTE — Patient Instructions (Addendum)
Medication Instructions:    Your physician recommends that you continue on your current medications as directed. Please refer to the Current Medication list given to you today.  Labwork:  NONE  Testing/Procedures: Your physician has requested that you have an echocardiogram in 4 months just before your next visit. Echocardiography is a painless test that uses sound waves to create images of your heart. It provides your doctor with information about the size and shape of your heart and how well your heart's chambers and valves are working. This procedure takes approximately one hour. There are no restrictions for this procedure.  Follow-Up:  Your physician recommends that you schedule a follow-up appointment in: 4 months (office).  Any Other Special Instructions Will Be Listed Below (If Applicable).  If you need a refill on your cardiac medications before your next appointment, please call your pharmacy.

## 2019-09-05 NOTE — Progress Notes (Signed)
Cardiology Office Note  Date: 09/05/2019   ID: [name], DOB 1955-07-07, MRN [id number]  PCP:  [name], [place]  Cardiologist:  Rozann Lesches, MD Electrophysiologist:  None   Chief Complaint  Patient presents with  . Cardiac follow-up    History of Present Illness: Thomas [name] is a 64 y.o. male last seen in January.  He presents for routine visit.  From a cardiac perspective, he does not report any obvious angina symptoms or nitroglycerin use.  He remains fairly sedentary.  He does report compliance with his medications.  He is now following with a new PCP and had recent lab work which we are requesting for review.  I reviewed his medications which are outlined below.  Current cardiac regimen is stable.  He reports adequate control of leg swelling on current dose of Lasix.  Last echocardiogram was in July 2020 as reviewed below.  He states that he has had the coronavirus vaccine.  Past Medical History:  Diagnosis Date  . Morbid obesity ([place])   . NSTEMI (non-ST elevated myocardial infarction) ([place])    a. s/p cath in 03/2018 showing 90% OM2 stenosis and nonobstructive CAD along LAD and LCx with medical management recommended  . Secondary cardiomyopathy ([place])    a. EF reported as 20-25% by echo in 03/2018 b. EF at 45% by repeat imaging in 10/2018  . Snoring   . Type 2 diabetes mellitus ([place])     Past Surgical History:  Procedure Laterality Date  . LEFT HEART CATH AND CORONARY ANGIOGRAPHY N/A 03/30/2018   Procedure: LEFT HEART CATH AND CORONARY ANGIOGRAPHY;  Surgeon: Troy Sine, MD;  Location: [facility];  Service: Cardiovascular;  Laterality: N/A;    Current Outpatient Medications  Medication Sig Dispense Refill  . alum & mag hydroxide-simeth (MAALOX/MYLANTA) 200-200-20 MG/5ML suspension Take 30 mLs by mouth every 2 (two) hours as needed for indigestion. 355 mL 0  . aspirin EC 81 MG tablet Take 81 mg by mouth daily.    Marland Kitchen  atorvastatin (LIPITOR) 80 MG tablet TAKE 1 TABLET (80 MG TOTAL) BY MOUTH DAILY AT 6 PM. 90 tablet 3  . canagliflozin (INVOKANA) 300 MG TABS tablet Take 300 mg by mouth daily before breakfast.    . clopidogrel (PLAVIX) 75 MG tablet Take 1 tablet (75 mg total) by mouth daily with breakfast. 90 tablet 3  . isosorbide mononitrate (IMDUR) 30 MG 24 hr tablet TAKE 1 TABLET BY MOUTH EVERY DAY 90 tablet 1  . metFORMIN (GLUCOPHAGE-XR) 500 MG 24 hr tablet Take 1,000 mg by mouth 2 (two) times daily.    . nitroGLYCERIN (NITROSTAT) 0.4 MG SL tablet Place 1 tablet (0.4 mg total) under the tongue every 5 (five) minutes as needed for chest pain. 25 tablet 0  . omeprazole (PRILOSEC) 40 MG capsule Take 40 mg by mouth daily.    . potassium chloride (KLOR-CON) 10 MEQ tablet TAKE 1 TABLET BY MOUTH EVERY DAY 90 tablet 1  . sacubitril-valsartan (ENTRESTO) 24-26 MG Take 1 tablet by mouth 2 (two) times daily. 56 tablet 0  . carvedilol (COREG) 12.5 MG tablet Take 1 tablet (12.5 mg total) by mouth 2 (two) times daily. 180 tablet 3  . furosemide (LASIX) 20 MG tablet Take 1 tablet (20 mg total) by mouth 2 (two) times daily. 180 tablet 1   No current facility-administered medications for this visit.   Allergies:  Codeine and Doxycycline   ROS:   No palpitations or syncope.  Physical Exam: VS:  BP (!) 118/54   Pulse 98   Ht 5\' 9"  (1.753 m)   Wt 294 lb (133.4 kg)   SpO2 (!) 89%   BMI 43.42 kg/m , BMI Body mass index is 43.42 kg/m.  Wt Readings from Last 3 Encounters:  09/05/19 294 lb (133.4 kg)  05/07/19 290 lb (131.5 kg)  01/31/19 288 lb (130.6 kg)    General: Obese male, appears comfortable at rest. HEENT: Conjunctiva and lids normal, wearing a mask. Neck: Supple, no elevated JVP or carotid bruits, no thyromegaly. Lungs: Decreased breath sounds without wheezes, nonlabored breathing at rest. Cardiac: Regular rate and rhythm, no S3 or significant systolic murmur, no pericardial rub. Extremities: No pitting  edema, distal pulses 2+.  ECG:  An ECG dated 01/16/2019 was personally reviewed today and demonstrated:  Sinus rhythm with borderline prolonged PR interval, rightward axis, poor anterior R wave progression.  Recent Labwork: 12/14/2018: ALT 46; AST 26; B Natriuretic Peptide 78.0 12/21/2018: BUN 53; Creatinine, Ser 1.05; Hemoglobin 18.6; Platelets 173; Potassium 2.8; Sodium 137 12/22/2018: Magnesium 2.23 February 2019: Hemoglobin 13.3, platelets 148, TSH 2.49, cholesterol 85, triglycerides 133, HDL 35, LDL 27, BUN 15, creatinine 0.86, potassium 3.8, AST 8, ALT 7  Other Studies Reviewed Today:  Echocardiogram 11/08/2018: 1. The left ventricle has a visually estimated ejection fraction of approximately 45%. The cavity size was mildly dilated. Indeterminate diastolic function. 2. Moderate hypokinesis of the left ventricular, mid-apical anteroseptal wall. 3. The right ventricle has normal systolic function. The cavity was normal. There is no increase in right ventricular wall thickness. Right ventricular systolic pressure could not be assessed. 4. The aortic valve is tricuspid. Mild calcification of the aortic valve. Mild aortic annular calcification noted. 5. The mitral valve is grossly normal. There is mild mitral annular calcification present. 6. The tricuspid valve is grossly normal. 7. The aorta is normal in size and structure.  Assessment and Plan:  1.  Ischemic cardiomyopathy, LVEF approximately 45% by assessment in July 2020.  He has been relatively stable in terms of chronic combined heart failure symptoms, reports no worsening leg swelling on current diuretic regimen.  Continue Coreg, Imdur, Entresto, and Lasix.  2.  Branch vessel CAD with plan for medical therapy.  Continue aspirin, Plavix, and Lipitor.  Medication Adjustments/Labs and Tests Ordered: Current medicines are reviewed at length with the patient today.  Concerns regarding medicines are outlined above.   Tests  Ordered: Orders Placed This Encounter  Procedures  . ECHOCARDIOGRAM COMPLETE    Medication Changes: No orders of the defined types were placed in this encounter.   Disposition:  Follow up 4 months in the [place] office.  Signed, Satira Sark, MD, [facility] 09/05/2019 2:43 PM    [facility] at [place], [place], [place] [address] Phone: [phone]; Fax: [phone]

## 2019-09-09 ENCOUNTER — Telehealth: Payer: Self-pay | Admitting: Cardiology

## 2019-09-09 NOTE — Telephone Encounter (Signed)
Woke up out of his sleep around midnight.   Soaking wet and chest was hurting real bad.    Chest is not hurting now - just doesn't feel right

## 2019-09-09 NOTE — Telephone Encounter (Signed)
Pt says he woke up last night sweating with chest pain lasting for 15 mins was able to go back to sleep - hasn't taken any NTG - woke up this morning and feels weak and left arm hurting - denies chest pain/dizziness/SOB at this time - doesn't know what HR/BP has been - scheduled with NP on 5/26 - pt aware that if symptoms worsen that he needs evaluation at [facility] ED

## 2019-09-09 NOTE — Progress Notes (Addendum)
Cardiology Office Note  Date: 09/11/2019   ID: Thomas [name], DOB 09/17/1955, MRN [id number]  PCP:  [name], [place]  Cardiologist:  Rozann Lesches, MD Electrophysiologist:  None   Chief Complaint: F/U CP, chronic combined systolic and diastolic HF, CAD (hx of NSTEMI, secondary cardiomyopathy  History of Present Illness: Thomas [name] is a 63 y.o. male with a history of  chronic combined systolic and diastolic HF, CAD (hx of NSTEMI), secondary cardiomyopathy, morbid obesity, snoring, DM2.  Last seen by Dr Domenic Polite 09/05/2019. He reported no angina symptoms or NTG use. He was compliant with his medications. His leg swelling was controlled with current dosage of Lasix. He had received the Covid vaccine. His HF symptoms were stable. He was continuing Coreg, Imdur, Entresto, Lasix. He had no CAD symptoms and was continuing ASA, Plavix, and Lipitor.  He called yesterday stating he woke up night before last with CP lasting 15 minutes, but was able to go back to sleep. When he woke up in the morning he felt weak and left arm was hurting. At the time of the call he was not having CP, dizziness, or SOB.  Cardiac catheterization 03/30/2018: Diffusely diseased 2nd marginal of LCX. Recommendations were for initial medical therapy and if medical therapy failed, consider PCI to this vessel.  Patient states he woke up late Sunday night early Monday morning with left lower epigastric pain and diaphoresis.  He denied any radiation to neck, arm, back, jaw, or chest.  Denies any associated nausea or vomiting.  He states he has had issues with pain in this area and has had extensive work-up in the past and no one seems to be able to find the problem.  States he had a recent colonoscopy which showed polyps which were removed.  He continues to smoke.  States he has been smoking since the age of 45.  He states he wants  to stop but he believes he cannot.  He has significant issues with  diabetes.  He denies any CVA or TIA-like symptoms, orthostatic symptoms, bleeding in stool or urine but does have some hemorrhoids.  Denies any PND or orthopnea, claudication-like symptoms, or lower extremity edema.  Has issues with morbid obesity and arthritis issues in his back.  Past Medical History:  Diagnosis Date  . Morbid obesity ([place])   . NSTEMI (non-ST elevated myocardial infarction) ([place])    a. s/p cath in 03/2018 showing 90% OM2 stenosis and nonobstructive CAD along LAD and LCx with medical management recommended  . Secondary cardiomyopathy ([place])    a. EF reported as 20-25% by echo in 03/2018 b. EF at 45% by repeat imaging in 10/2018  . Snoring   . Type 2 diabetes mellitus ([place])     Past Surgical History:  Procedure Laterality Date  . LEFT HEART CATH AND CORONARY ANGIOGRAPHY N/A 03/30/2018   Procedure: LEFT HEART CATH AND CORONARY ANGIOGRAPHY;  Surgeon: Troy Sine, MD;  Location: [facility];  Service: Cardiovascular;  Laterality: N/A;    Current Outpatient Medications  Medication Sig Dispense Refill  . alum & mag hydroxide-simeth (MAALOX/MYLANTA) 200-200-20 MG/5ML suspension Take 30 mLs by mouth every 2 (two) hours as needed for indigestion. 355 mL 0  . aspirin EC 81 MG tablet Take 81 mg by mouth daily.    Marland Kitchen atorvastatin (LIPITOR) 80 MG tablet TAKE 1 TABLET (80 MG TOTAL) BY MOUTH DAILY AT 6 PM. 90 tablet 3  . canagliflozin (INVOKANA) 300 MG TABS tablet  Take 300 mg by mouth daily before breakfast.    . carvedilol (COREG) 12.5 MG tablet Take 1 tablet (12.5 mg total) by mouth 2 (two) times daily. 180 tablet 3  . clopidogrel (PLAVIX) 75 MG tablet Take 1 tablet (75 mg total) by mouth daily with breakfast. 90 tablet 3  . furosemide (LASIX) 20 MG tablet Take 1 tablet (20 mg total) by mouth 2 (two) times daily. 180 tablet 1  . ibuprofen (ADVIL) 200 MG tablet Take 200 mg by mouth every 6 (six) hours as needed.    . isosorbide mononitrate (IMDUR) 30 MG 24 hr tablet TAKE 1  TABLET BY MOUTH EVERY DAY 90 tablet 1  . metFORMIN (GLUCOPHAGE-XR) 500 MG 24 hr tablet Take 1,000 mg by mouth 2 (two) times daily.    . nitroGLYCERIN (NITROSTAT) 0.4 MG SL tablet Place 1 tablet (0.4 mg total) under the tongue every 5 (five) minutes as needed for chest pain. 25 tablet 0  . omeprazole (PRILOSEC) 40 MG capsule Take 40 mg by mouth daily.    . potassium chloride (KLOR-CON) 10 MEQ tablet TAKE 1 TABLET BY MOUTH EVERY DAY 90 tablet 1  . sacubitril-valsartan (ENTRESTO) 24-26 MG Take 1 tablet by mouth 2 (two) times daily. 56 tablet 0   No current facility-administered medications for this visit.   Allergies:  Codeine and Doxycycline   Social History: The patient  reports that he has been smoking cigarettes. He started smoking about 54 years ago. He has been smoking about 0.25 packs per day. He has never used smokeless tobacco. He reports previous alcohol use. He reports current drug use. Drug: Marijuana.   Family History: The patient's family history includes Emphysema in his father; Heart failure in his father.   ROS:  Please see the history of present illness. Otherwise, complete review of systems is positive for none.  All other systems are reviewed and negative.   Physical Exam: VS:  BP 124/76   Pulse 92   Ht 5\' 9"  (1.753 m)   Wt 289 lb (131.1 kg)   SpO2 90%   BMI 42.68 kg/m , BMI Body mass index is 42.68 kg/m.  Wt Readings from Last 3 Encounters:  09/11/19 289 lb (131.1 kg)  09/05/19 294 lb (133.4 kg)  05/07/19 290 lb (131.5 kg)    General: Morbidly obese patient appears comfortable at rest. Neck: Supple, no elevated JVP or carotid bruits, no thyromegaly. Lungs: Clear to auscultation, nonlabored breathing at rest. Cardiac: Regular rate and rhythm, no S3 or significant systolic murmur, no pericardial rub. Abdomen: Soft, has an area in his left upper gastric region which is tender to palpation., no hepatomegaly, bowel sounds present, no guarding or rebound.   Extremities: No pitting edema, distal pulses 2+. Skin: Warm and dry. Musculoskeletal: No kyphosis. Neuropsychiatric: Alert and oriented x3, affect grossly appropriate.  ECG:  An ECG dated 09/11/2019 was personally reviewed today and demonstrated:  Normal sinus rhythm rate of 87, septal infarct, age undetermined, lateral infarct age undetermined.  Recent Labwork: 12/14/2018: ALT 46; AST 26; B Natriuretic Peptide 78.0 12/21/2018: BUN 53; Creatinine, Ser 1.05; Hemoglobin 18.6; Platelets 173; Potassium 2.8; Sodium 137 12/22/2018: Magnesium 2.7     Component Value Date/Time   CHOL 181 03/30/2018 0543   TRIG 322 (H) 03/30/2018 0543   HDL 26 (L) 03/30/2018 0543   CHOLHDL 7.0 03/30/2018 0543   VLDL 64 (H) 03/30/2018 0543   LDLCALC 91 03/30/2018 0543    Other Studies Reviewed Today:  Cardiac catheterization 03/30/2018  Prox Cx lesion is 30% stenosed.  Ost 2nd Mrg to 2nd Mrg lesion is 90% stenosed.  2nd Mrg lesion is 90% stenosed.  Prox LAD lesion is 30% stenosed.   Moderate global LV dysfunction with an ejection fraction of 35 to 40%.  LVEDP 13 mm.  There is evidence for coronary calcification.  The LAD has 30% proximal stenosis.  The Circumflex vessel has mild 30% narrowing prior to giving off 3 marginal vessels and the AV groove circumflex.  The second marginal branch has diffuse disease with 90% near ostial to proximal stenosis and diffuse 90% mid stenoses.  The RCA is a normal dominant vessel.  RECOMMENDATION: Due to the patient's large body habitus, visualization of vessels was difficult.  Recommend an initial aggressive medical therapy regimen with high potency statin therapy with target LDL less than 70, beta-blocker, amlodipine, in addition to nitrate therapy.  Since cardiac enzymes are mildly positive consistent with a NSTEMI recommend initial dual antiplatelet therapy with aspirin/Plavix even though stenting was not performed.  If patient fails medical therapy consider  PCI to the diffusely diseased second marginal branch of the circumflex.  Smoking cessation is imperative.  Diagnostic Dominance: Right  Intervention     Echocardiogram 11/08/2018:  1. The left ventricle has a visually estimated ejection fraction of approximately 45%. The cavity size was mildly dilated. Indeterminate diastolic function. 2. Moderate hypokinesis of the left ventricular, mid-apical anteroseptal wall. 3. The right ventricle has normal systolic function. The cavity was normal. There is no increase in right ventricular wall thickness. Right ventricular systolic pressure could not be assessed. 4. The aortic valve is tricuspid. Mild calcification of the aortic valve. Mild aortic annular calcification noted. 5. The mitral valve is grossly normal. There is mild mitral annular calcification present. 6. The tricuspid valve is grossly normal. 7. The aorta is normal in size and structure.   Assessment and Plan:  1. Coronary artery disease involving native coronary artery of native heart with angina pectoris ([place])   2. Chronic combined systolic and diastolic heart failure ([place])   3. Ischemic cardiomyopathy   4. Smoking   5. Mixed hyperlipidemia    1. Coronary artery disease involving native coronary artery of native heart with angina pectoris [facility]) Had a recent episode of epigastric pain in the left upper gastric region.  Area is tender to palpation.  Patient states when he presses on the area he becomes nauseated and starts belching.  He states he woke up Sunday night early Monday morning with pain in this area and diaphoresis.  He denied any radiation to neck, arm, back, jaw or chest.  He denied any nausea, or vomiting.  He has not had an EGD.  He states he will talk to his PCP and get a referral to a gastroenterologist for evaluation.  Increase Imdur to 60 mg.  Continue aspirin 81 mg daily, nitroglycerin sublingual as needed,, carvedilol 12.5 mg p.o. twice daily, Plavix 75  mg daily.  Advised patient to have his PCP refer him to GI  2. Chronic combined systolic and diastolic heart failure [facility]) Patient states he had an episode of heart failure secondary to onset of pneumonia.  States that once the pneumonia was resolved he had no more episodes of heart failure.  Continue Entresto 24/26 mg p.o. twice daily, Lasix 20 mg p.o. twice daily, Coreg 12.5 mg p.o. twice daily,   3. Ischemic cardiomyopathy Last echocardiogram showed EF of 45%.  LV cavity was mildly dilated, indeterminate diastolic function.  Moderate  hypokinesis of LV, mid apical anteroseptal wall.  4. Smoking Significant history of smoking since age 59.  States he cannot seem to quit.  Has audible wheezing when examining chest.  Advised the patient to have his PCP refer him to pulmonology.  5. Mixed hyperlipidemia Last known lipid panel 1 year ago total cholesterol 181, triglycerides 322, HDL 26, LDL 91.  Not at goal.  Continue high intensity statin atorvastatin 80 mg daily.   Medication Adjustments/Labs and Tests Ordered: Current medicines are reviewed at length with the patient today.  Concerns regarding medicines are outlined above.   Disposition: Follow-up with Dr. Domenic Polite or APP 3 months. Signed, Levell July, NP 09/11/2019 8:47 AM    [facility] at [place], [place], [place] [address] Phone: [phone]; Fax: [phone]

## 2019-09-11 ENCOUNTER — Ambulatory Visit (INDEPENDENT_AMBULATORY_CARE_PROVIDER_SITE_OTHER): Payer: Medicare HMO | Admitting: Family Medicine

## 2019-09-11 ENCOUNTER — Encounter: Payer: Self-pay | Admitting: Family Medicine

## 2019-09-11 ENCOUNTER — Other Ambulatory Visit: Payer: Self-pay

## 2019-09-11 VITALS — BP 124/76 | HR 92 | Ht 69.0 in | Wt 289.0 lb

## 2019-09-11 DIAGNOSIS — E782 Mixed hyperlipidemia: Secondary | ICD-10-CM

## 2019-09-11 DIAGNOSIS — F17200 Nicotine dependence, unspecified, uncomplicated: Secondary | ICD-10-CM | POA: Diagnosis not present

## 2019-09-11 DIAGNOSIS — Z6841 Body Mass Index (BMI) 40.0 and over, adult: Secondary | ICD-10-CM | POA: Diagnosis not present

## 2019-09-11 DIAGNOSIS — I255 Ischemic cardiomyopathy: Secondary | ICD-10-CM | POA: Diagnosis not present

## 2019-09-11 DIAGNOSIS — I5042 Chronic combined systolic (congestive) and diastolic (congestive) heart failure: Secondary | ICD-10-CM

## 2019-09-11 DIAGNOSIS — I25119 Atherosclerotic heart disease of native coronary artery with unspecified angina pectoris: Secondary | ICD-10-CM | POA: Diagnosis not present

## 2019-09-11 MED ORDER — ISOSORBIDE MONONITRATE ER 60 MG PO TB24: 60.0000 mg | ORAL_TABLET | Freq: Every day | ORAL | 3 refills | Status: DC

## 2019-09-11 NOTE — Patient Instructions (Signed)
Medication Instructions:  Your physician recommends that you continue on your current medications as directed. Please refer to the Current Medication list given to you today.  Increase Imdur to 60 mg Daily    *If you need a refill on your cardiac medications before your next appointment, please call your pharmacy*   Lab Work: NONE   If you have labs (blood work) drawn today and your tests are completely normal, you will receive your results only by: Marland Kitchen MyChart Message (if you have MyChart) OR . A paper copy in the mail If you have any lab test that is abnormal or we need to change your treatment, we will call you to review the results.   Testing/Procedures: NONE   Follow-Up: At [facility], you and your health needs are our priority.  As part of our continuing mission to provide you with exceptional heart care, we have created designated Provider Care Teams.  These Care Teams include your primary Cardiologist (physician) and Advanced Practice Providers (APPs -  Physician Assistants and Nurse Practitioners) who all work together to provide you with the care you need, when you need it.  We recommend signing up for the patient portal called "MyChart".  Sign up information is provided on this After Visit Summary.  MyChart is used to connect with patients for Virtual Visits (Telemedicine).  Patients are able to view lab/test results, encounter notes, upcoming appointments, etc.  Non-urgent messages can be sent to your provider as well.   To learn more about what you can do with MyChart, go to [email or web address].    Your next appointment:   6 month(s)  The format for your next appointment:   In Person  Provider:   Rozann Lesches, MD   Other Instructions Thank you for choosing [facility]!

## 2019-09-12 NOTE — Addendum Note (Signed)
Addended by: Julian Hy T on: 09/12/2019 10:13 AM   Modules accepted: Orders

## 2019-09-16 DIAGNOSIS — K219 Gastro-esophageal reflux disease without esophagitis: Secondary | ICD-10-CM | POA: Diagnosis not present

## 2019-09-16 DIAGNOSIS — M549 Dorsalgia, unspecified: Secondary | ICD-10-CM | POA: Diagnosis not present

## 2019-09-18 ENCOUNTER — Other Ambulatory Visit: Payer: Self-pay | Admitting: Cardiology

## 2019-09-18 ENCOUNTER — Encounter: Payer: Self-pay | Admitting: Cardiology

## 2019-09-18 NOTE — Telephone Encounter (Signed)
°*  STAT* If patient is at the pharmacy, call can be transferred to refill team.   1. Which medications need to be refilled?  furosemide (LASIX) 20 MG tablet    2. Which pharmacy/location (including street and city if local pharmacy) is medication to be sent to? [facility]  3. Do they need a 30 day or 90 day supply?

## 2019-09-18 NOTE — Telephone Encounter (Signed)
This encounter was created in error - please disregard.

## 2019-09-19 ENCOUNTER — Other Ambulatory Visit: Payer: Self-pay | Admitting: Cardiology

## 2019-09-30 ENCOUNTER — Other Ambulatory Visit: Payer: Self-pay | Admitting: Cardiology

## 2019-10-14 DIAGNOSIS — W57XXXA Bitten or stung by nonvenomous insect and other nonvenomous arthropods, initial encounter: Secondary | ICD-10-CM | POA: Diagnosis not present

## 2019-10-14 DIAGNOSIS — E119 Type 2 diabetes mellitus without complications: Secondary | ICD-10-CM | POA: Diagnosis not present

## 2019-10-14 DIAGNOSIS — K635 Polyp of colon: Secondary | ICD-10-CM | POA: Diagnosis not present

## 2019-11-13 ENCOUNTER — Other Ambulatory Visit: Payer: Self-pay | Admitting: Cardiology

## 2019-12-14 ENCOUNTER — Other Ambulatory Visit: Payer: Self-pay | Admitting: Cardiology

## 2019-12-16 DIAGNOSIS — E785 Hyperlipidemia, unspecified: Secondary | ICD-10-CM | POA: Diagnosis not present

## 2019-12-16 DIAGNOSIS — Z79899 Other long term (current) drug therapy: Secondary | ICD-10-CM | POA: Diagnosis not present

## 2019-12-16 DIAGNOSIS — E119 Type 2 diabetes mellitus without complications: Secondary | ICD-10-CM | POA: Diagnosis not present

## 2019-12-16 DIAGNOSIS — Z720 Tobacco use: Secondary | ICD-10-CM | POA: Diagnosis not present

## 2019-12-16 DIAGNOSIS — K635 Polyp of colon: Secondary | ICD-10-CM | POA: Diagnosis not present

## 2020-01-06 DIAGNOSIS — R079 Chest pain, unspecified: Secondary | ICD-10-CM | POA: Diagnosis not present

## 2020-01-06 DIAGNOSIS — J90 Pleural effusion, not elsewhere classified: Secondary | ICD-10-CM | POA: Diagnosis not present

## 2020-01-06 DIAGNOSIS — R0781 Pleurodynia: Secondary | ICD-10-CM | POA: Diagnosis not present

## 2020-01-06 DIAGNOSIS — I700 Atherosclerosis of aorta: Secondary | ICD-10-CM | POA: Diagnosis not present

## 2020-01-06 DIAGNOSIS — S299XXA Unspecified injury of thorax, initial encounter: Secondary | ICD-10-CM | POA: Diagnosis not present

## 2020-01-06 DIAGNOSIS — J9811 Atelectasis: Secondary | ICD-10-CM | POA: Diagnosis not present

## 2020-01-06 DIAGNOSIS — W19XXXA Unspecified fall, initial encounter: Secondary | ICD-10-CM | POA: Diagnosis not present

## 2020-01-06 DIAGNOSIS — L918 Other hypertrophic disorders of the skin: Secondary | ICD-10-CM | POA: Diagnosis not present

## 2020-01-08 ENCOUNTER — Other Ambulatory Visit: Payer: Medicare HMO

## 2020-01-09 ENCOUNTER — Ambulatory Visit: Payer: Medicare HMO | Admitting: Cardiology

## 2020-01-23 DIAGNOSIS — E119 Type 2 diabetes mellitus without complications: Secondary | ICD-10-CM | POA: Diagnosis not present

## 2020-01-23 DIAGNOSIS — Z720 Tobacco use: Secondary | ICD-10-CM | POA: Diagnosis not present

## 2020-01-23 DIAGNOSIS — R0781 Pleurodynia: Secondary | ICD-10-CM | POA: Diagnosis not present

## 2020-01-30 ENCOUNTER — Ambulatory Visit (INDEPENDENT_AMBULATORY_CARE_PROVIDER_SITE_OTHER): Payer: Medicare HMO

## 2020-01-30 ENCOUNTER — Other Ambulatory Visit: Payer: Self-pay

## 2020-01-30 DIAGNOSIS — I517 Cardiomegaly: Secondary | ICD-10-CM

## 2020-01-30 DIAGNOSIS — I429 Cardiomyopathy, unspecified: Secondary | ICD-10-CM | POA: Diagnosis not present

## 2020-01-30 LAB — ECHOCARDIOGRAM COMPLETE
Area-P 1/2: 3.6 cm2
S' Lateral: 5.69 cm

## 2020-02-03 ENCOUNTER — Telehealth: Payer: Self-pay | Admitting: *Deleted

## 2020-02-03 NOTE — Telephone Encounter (Signed)
-----   Message from Satira Sark, MD sent at 02/02/2020  4:14 PM EDT ----- Results reviewed.  Difficult images with echocardiogram, however LVEF looks to be better than at last assessment, greater than 50%.  He has been clinically stable as of recent visit, continue medical therapy and follow-up plan.

## 2020-02-03 NOTE — Telephone Encounter (Signed)
Laurine Blazer, LPN  70/14/1030 1:31 AM EDT Back to Top    Notified, copy to pcp.

## 2020-02-14 ENCOUNTER — Other Ambulatory Visit: Payer: Self-pay | Admitting: Cardiology

## 2020-02-14 MED ORDER — NITROGLYCERIN 0.4 MG SL SUBL: 0.4000 mg | SUBLINGUAL_TABLET | SUBLINGUAL | 3 refills | Status: DC | PRN

## 2020-02-14 NOTE — Telephone Encounter (Signed)
Refilled ntg

## 2020-02-14 NOTE — Telephone Encounter (Signed)
New message      *STAT* If patient is at the pharmacy, call can be transferred to refill team.   1. Which medications need to be refilled? (please list name of each medication and dose if known) nitro -his rx expired   2. Which pharmacy/location (including street and city if local pharmacy) is medication to be sent to? [facility]   3. Do they need a 30 day or 90 day supply? [place]

## 2020-02-18 DIAGNOSIS — E119 Type 2 diabetes mellitus without complications: Secondary | ICD-10-CM | POA: Diagnosis not present

## 2020-02-18 DIAGNOSIS — M179 Osteoarthritis of knee, unspecified: Secondary | ICD-10-CM | POA: Diagnosis not present

## 2020-02-18 DIAGNOSIS — R0781 Pleurodynia: Secondary | ICD-10-CM | POA: Diagnosis not present

## 2020-02-18 DIAGNOSIS — Z720 Tobacco use: Secondary | ICD-10-CM | POA: Diagnosis not present

## 2020-02-20 ENCOUNTER — Encounter: Payer: Self-pay | Admitting: Cardiology

## 2020-02-20 ENCOUNTER — Encounter: Payer: Self-pay | Admitting: *Deleted

## 2020-02-20 ENCOUNTER — Ambulatory Visit: Payer: Medicare HMO | Admitting: Cardiology

## 2020-02-20 ENCOUNTER — Other Ambulatory Visit: Payer: Self-pay | Admitting: *Deleted

## 2020-02-20 VITALS — BP 150/70 | HR 98 | Ht 69.0 in | Wt 298.2 lb

## 2020-02-20 DIAGNOSIS — E782 Mixed hyperlipidemia: Secondary | ICD-10-CM | POA: Diagnosis not present

## 2020-02-20 DIAGNOSIS — I255 Ischemic cardiomyopathy: Secondary | ICD-10-CM

## 2020-02-20 DIAGNOSIS — I25119 Atherosclerotic heart disease of native coronary artery with unspecified angina pectoris: Secondary | ICD-10-CM | POA: Diagnosis not present

## 2020-02-20 MED ORDER — NITROGLYCERIN 0.4 MG SL SUBL: 0.4000 mg | SUBLINGUAL_TABLET | SUBLINGUAL | 3 refills | Status: DC | PRN

## 2020-02-20 NOTE — Patient Instructions (Addendum)

## 2020-02-20 NOTE — Progress Notes (Signed)
Cardiology Office Note  Date: 02/20/2020   ID: Thomas [name], DOB 1956-01-26, MRN [id number]  PCP:  Thomas [name], [place]  Cardiologist:  Thomas Lesches, MD Electrophysiologist:  None   Chief Complaint  Patient presents with  . Cardiac follow-up    History of Present Illness: Thomas [name] is a 64 y.o. male last seen in May by Mr. Thomas Sake NP.  He presents for a routine visit.  He does not report any active angina at this time.  Does state that he has had an intermittently productive cough, no fevers or chills.  He has a longstanding history of tobacco abuse, trying to cut back, but has had a very difficult time quitting.  He follows at the [facility].  He did have a chest x-ray in late September that showed a small right pleural effusion with right base atelectasis, mild cardiomegaly.  His weight has increased since last encounter.  I talked with him about daily weights and self adjustment in Lasix.  Imdur was increased to 60 mg daily at the last visit.  Otherwise cardiac regimen is stable.  Recent echocardiogram in October revealed very limited images, however LVEF greater than 50% which represented an improvement compared to prior assessment, also normal RV contraction.  Past Medical History:  Diagnosis Date  . Morbid obesity ([place])   . NSTEMI (non-ST elevated myocardial infarction) ([place])    a. s/p cath in 03/2018 showing 90% OM2 stenosis and nonobstructive CAD along LAD and LCx with medical management recommended  . Secondary cardiomyopathy ([place])    a. EF reported as 20-25% by echo in 03/2018 b. EF at 45% by repeat imaging in 10/2018  . Snoring   . Type 2 diabetes mellitus ([place])     Past Surgical History:  Procedure Laterality Date  . LEFT HEART CATH AND CORONARY ANGIOGRAPHY N/A 03/30/2018   Procedure: LEFT HEART CATH AND CORONARY ANGIOGRAPHY;  Surgeon: Troy Sine, MD;  Location: [place] [facility];  Service: Cardiovascular;   Laterality: N/A;    Current Outpatient Medications  Medication Sig Dispense Refill  . alum & mag hydroxide-simeth (MAALOX/MYLANTA) 200-200-20 MG/5ML suspension Take 30 mLs by mouth every 2 (two) hours as needed for indigestion. 355 mL 0  . aspirin EC 81 MG tablet Take 81 mg by mouth daily.    Marland Kitchen atorvastatin (LIPITOR) 80 MG tablet TAKE 1 TABLET (80 MG TOTAL) BY MOUTH DAILY AT 6 PM. 90 tablet 3  . canagliflozin (INVOKANA) 300 MG TABS tablet Take 300 mg by mouth daily before breakfast.    . carvedilol (COREG) 12.5 MG tablet TAKE 1 TABLET (12.5 MG TOTAL) BY MOUTH 2 (TWO) TIMES DAILY. 180 tablet 3  . clopidogrel (PLAVIX) 75 MG tablet TAKE 1 TABLET (75 MG TOTAL) BY MOUTH DAILY WITH BREAKFAST. 90 tablet 3  . furosemide (LASIX) 20 MG tablet TAKE 1 TABLET BY MOUTH TWICE A DAY 180 tablet 1  . ibuprofen (ADVIL) 200 MG tablet Take 200 mg by mouth every 6 (six) hours as needed.    . isosorbide mononitrate (IMDUR) 60 MG 24 hr tablet Take 1 tablet (60 mg total) by mouth daily. 90 tablet 3  . metFORMIN (GLUCOPHAGE-XR) 500 MG 24 hr tablet Take 1,000 mg by mouth 2 (two) times daily.    . nitroGLYCERIN (NITROSTAT) 0.4 MG SL tablet Place 1 tablet (0.4 mg total) under the tongue every 5 (five) minutes as needed for chest pain. 25 tablet 3  . omeprazole (PRILOSEC) 40 MG  capsule Take 40 mg by mouth daily.    . potassium chloride (KLOR-CON) 10 MEQ tablet TAKE 1 TABLET BY MOUTH EVERY DAY 90 tablet 1  . sacubitril-valsartan (ENTRESTO) 24-26 MG Take 1 tablet by mouth 2 (two) times daily. 56 tablet 0   No current facility-administered medications for this visit.   Allergies:  Codeine and Doxycycline   ROS: No palpitations or syncope.  Physical Exam: VS:  BP (!) 150/70   Pulse 98   Ht 5\' 9"  (1.753 m)   Wt 298 lb 3.2 oz (135.3 kg)   SpO2 92%   BMI 44.04 kg/m , BMI Body mass index is 44.04 kg/m.  Wt Readings from Last 3 Encounters:  02/20/20 298 lb 3.2 oz (135.3 kg)  09/11/19 289 lb (131.1 kg)  09/05/19 294  lb (133.4 kg)    General: Morbidly obese male, no distress. HEENT: Conjunctiva and lids normal, wearing a mask. Neck: Supple, increased girth without obvious JVP. Lungs: Decreased breath sounds with prolonged expiratory phase and end expiratory wheeze, nonlabored at rest. Cardiac: Distant regular rate and rhythm, no gallop or rub. Abdomen: Protuberant, bowel sounds present. Extremities: Stable appearing lower leg edema.  ECG:  An ECG dated 09/11/2019 was personally reviewed today and demonstrated:  Sinus rhythm with old anterior infarct pattern.  Recent Labwork:  November 2020: Hemoglobin 13.3, platelets 148, TSH 2.49, cholesterol 85, triglycerides 133, HDL 35, LDL 27, BUN 15, creatinine 0.86, potassium 3.8, AST 8, ALT 7, PSA 12.8  Other Studies Reviewed Today:  Echocardiogram 01/30/2020: 1. Very difficult visualization of the left ventricle, recommend  echocontrast for better evaluation. LVEF is probably normal >50%. Left  ventricular ejection fraction, by estimation, is difficult to assess%. The  left ventricle has normal function. Left  ventricular endocardial border not optimally defined to evaluate regional  wall motion. There is mild left ventricular hypertrophy. Left ventricular  diastolic parameters are indeterminate.  2. Right ventricular systolic function is normal. The right ventricular  size is normal.  3. The mitral valve was not well visualized. No evidence of mitral valve  regurgitation. No evidence of mitral stenosis.  4. The aortic valve is tricuspid. Aortic valve regurgitation is not  visualized. No aortic stenosis is present.   Comparison(s): Previous Echo TDS. Visual EF estimated at 45%, moderate  hypokinesis of mid-apical anteroseptal wall.   Assessment and Plan:  1.  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  Recent echocardiogram had limited images, but LVEF looked to be greater than 50% representing an improvement compared to last  evaluation.  He has evidence of fluid overload today however, plan to take an additional Lasix for at least the next 2 days.  We discussed daily weights, use of extra Lasix if he gains 2 to 3 pounds in 24 hours or 5 pounds in 1 week.  Otherwise continue aspirin, Coreg, Plavix, Imdur, Entresto, and potassium supplement.  Requesting interval lab work from PCP.  2.  Probable chronic, intermittent hypoxic respiratory failure in the setting of longstanding tobacco abuse and likely COPD.  He is trying to cut back on his smoking.  Keep follow-up with PCP and consider referral to Pulmonary.  3.  Branch vessel CAD, no progressive angina on current regimen.  Medication Adjustments/Labs and Tests Ordered: Current medicines are reviewed at length with the patient today.  Concerns regarding medicines are outlined above.   Tests Ordered: No orders of the defined types were placed in this encounter.   Medication Changes: No orders of the defined types  were placed in this encounter.   Disposition:  Follow up 6 months in the [place] office.  Signed, Satira Sark, MD, [facility] 02/20/2020 2:19 PM    [facility] [place], [place], [place] [address] Phone: [phone]; Fax: [phone]

## 2020-03-02 ENCOUNTER — Other Ambulatory Visit: Payer: Self-pay | Admitting: Cardiology

## 2020-03-02 MED ORDER — FUROSEMIDE 20 MG PO TABS: 20.0000 mg | ORAL_TABLET | Freq: Two times a day (BID) | ORAL | 1 refills | Status: DC

## 2020-03-02 NOTE — Telephone Encounter (Signed)
*  STAT* If patient is at the pharmacy, call can be transferred to refill team.   1. Which medications need to be refilled? (please list name of each medication and dose if known)  furosemide (LASIX) 20 MG tablet [[id number]]    2. Which pharmacy/location (including street and city if local pharmacy) is medication to be sent to? [facility]    3. Do they need a 30 day or 90 day supply?

## 2020-03-02 NOTE — Telephone Encounter (Signed)
Medication sent to pharmacy  

## 2020-03-05 IMAGING — US US CHEST/MEDIASTINUM
1 series · 3 of 3 positions shown · non-contrast
Comparison: Chest CT 12/19/2018

CLINICAL DATA: 62-year-old with a small right pleural effusion.
Evaluate for thoracentesis.

EXAM:
CHEST ULTRASOUND

[Series 1: us chest/mediastinum · 3 of 3 slices shown]
[im 1/3]
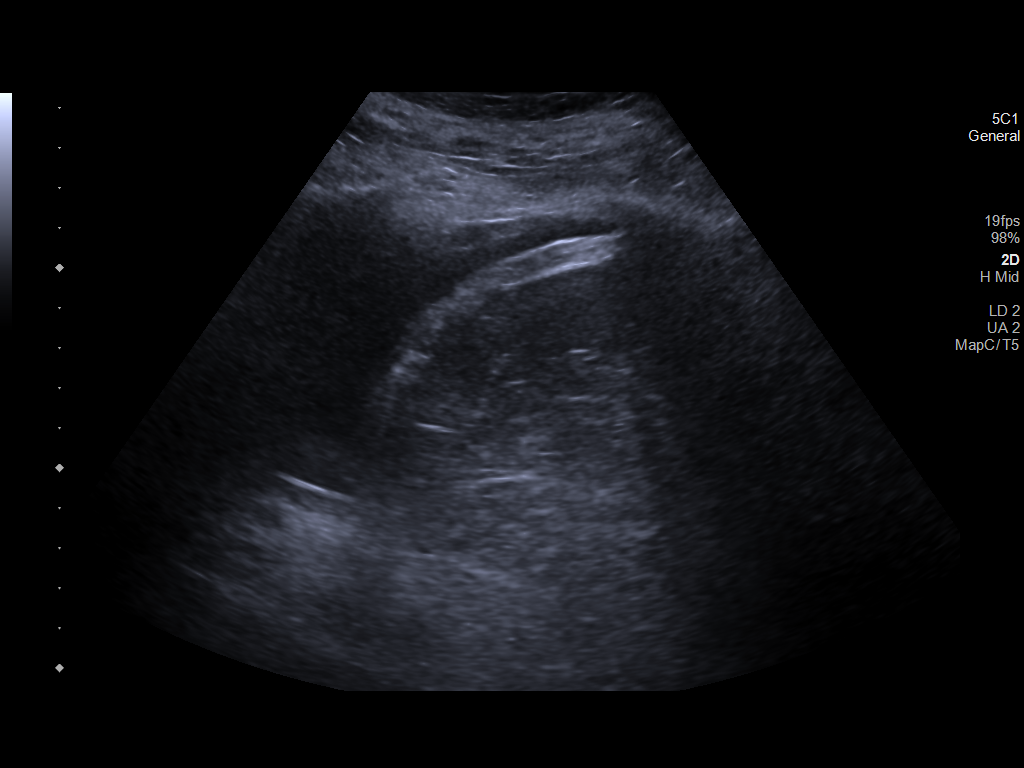
[im 2/3]
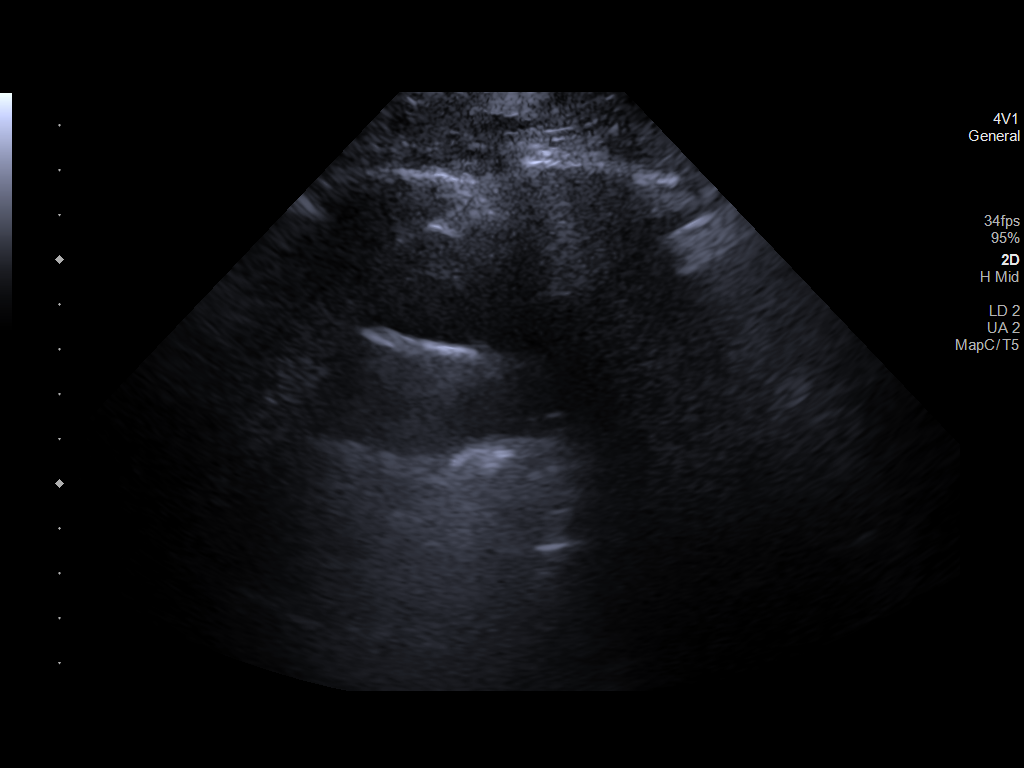
[im 3/3]
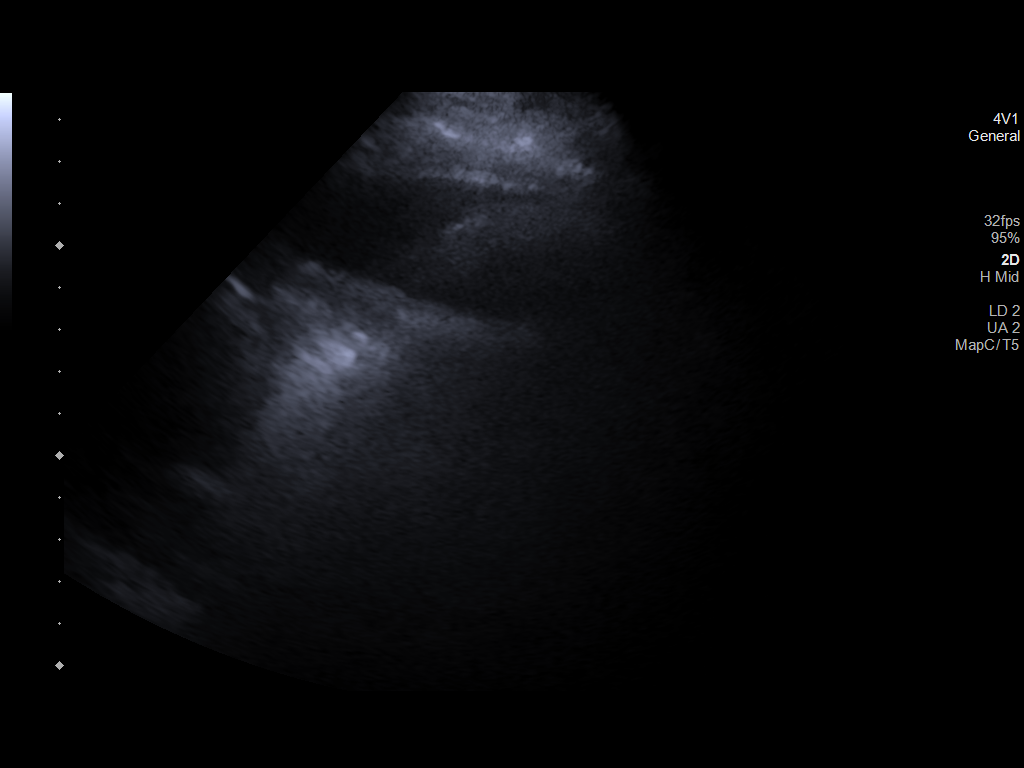

[3 of 3 positions shown; findings below may reference images not displayed]

FINDINGS: There is a very small amount of pleural fluid in the right chest.
Right thoracentesis was discussed with the patient.
IMPRESSION: Very small amount of right pleural fluid. The right pleural fluid
was amendable for a diagnostic thoracentesis but patient refused the
procedure.

## 2020-03-10 ENCOUNTER — Other Ambulatory Visit: Payer: Self-pay | Admitting: *Deleted

## 2020-03-10 MED ORDER — ENTRESTO 24-26 MG PO TABS: 1.0000 | ORAL_TABLET | Freq: Two times a day (BID) | ORAL | 2 refills | Status: DC

## 2020-03-15 ENCOUNTER — Other Ambulatory Visit: Payer: Self-pay | Admitting: Cardiology

## 2020-03-17 ENCOUNTER — Other Ambulatory Visit: Payer: Self-pay | Admitting: *Deleted

## 2020-03-17 MED ORDER — ATORVASTATIN CALCIUM 80 MG PO TABS: 80.0000 mg | ORAL_TABLET | Freq: Every day | ORAL | 1 refills | Status: DC

## 2020-03-27 ENCOUNTER — Other Ambulatory Visit: Payer: Self-pay | Admitting: Cardiology

## 2020-04-07 DIAGNOSIS — J440 Chronic obstructive pulmonary disease with acute lower respiratory infection: Secondary | ICD-10-CM | POA: Diagnosis not present

## 2020-04-07 DIAGNOSIS — J180 Bronchopneumonia, unspecified organism: Secondary | ICD-10-CM | POA: Diagnosis not present

## 2020-04-07 DIAGNOSIS — I10 Essential (primary) hypertension: Secondary | ICD-10-CM | POA: Diagnosis not present

## 2020-04-07 DIAGNOSIS — Z720 Tobacco use: Secondary | ICD-10-CM | POA: Diagnosis not present

## 2020-04-24 DIAGNOSIS — S2232XD Fracture of one rib, left side, subsequent encounter for fracture with routine healing: Secondary | ICD-10-CM | POA: Diagnosis not present

## 2020-04-24 DIAGNOSIS — R0781 Pleurodynia: Secondary | ICD-10-CM | POA: Diagnosis not present

## 2020-04-24 DIAGNOSIS — J440 Chronic obstructive pulmonary disease with acute lower respiratory infection: Secondary | ICD-10-CM | POA: Diagnosis not present

## 2020-04-26 DIAGNOSIS — I517 Cardiomegaly: Secondary | ICD-10-CM | POA: Diagnosis not present

## 2020-04-26 DIAGNOSIS — J9811 Atelectasis: Secondary | ICD-10-CM | POA: Diagnosis not present

## 2020-04-26 DIAGNOSIS — J9601 Acute respiratory failure with hypoxia: Secondary | ICD-10-CM | POA: Diagnosis not present

## 2020-04-26 DIAGNOSIS — E785 Hyperlipidemia, unspecified: Secondary | ICD-10-CM | POA: Diagnosis not present

## 2020-04-26 DIAGNOSIS — I2510 Atherosclerotic heart disease of native coronary artery without angina pectoris: Secondary | ICD-10-CM | POA: Diagnosis not present

## 2020-04-26 DIAGNOSIS — I700 Atherosclerosis of aorta: Secondary | ICD-10-CM | POA: Diagnosis not present

## 2020-04-26 DIAGNOSIS — I509 Heart failure, unspecified: Secondary | ICD-10-CM | POA: Diagnosis not present

## 2020-04-26 DIAGNOSIS — I110 Hypertensive heart disease with heart failure: Secondary | ICD-10-CM | POA: Diagnosis not present

## 2020-04-26 DIAGNOSIS — Z6841 Body Mass Index (BMI) 40.0 and over, adult: Secondary | ICD-10-CM | POA: Diagnosis not present

## 2020-04-26 DIAGNOSIS — J9602 Acute respiratory failure with hypercapnia: Secondary | ICD-10-CM | POA: Diagnosis not present

## 2020-04-26 DIAGNOSIS — E119 Type 2 diabetes mellitus without complications: Secondary | ICD-10-CM | POA: Diagnosis not present

## 2020-04-26 DIAGNOSIS — I10 Essential (primary) hypertension: Secondary | ICD-10-CM | POA: Insufficient documentation

## 2020-04-26 DIAGNOSIS — J449 Chronic obstructive pulmonary disease, unspecified: Secondary | ICD-10-CM | POA: Diagnosis not present

## 2020-04-26 DIAGNOSIS — I5023 Acute on chronic systolic (congestive) heart failure: Secondary | ICD-10-CM | POA: Diagnosis not present

## 2020-04-26 DIAGNOSIS — R0602 Shortness of breath: Secondary | ICD-10-CM | POA: Diagnosis not present

## 2020-04-26 DIAGNOSIS — E872 Acidosis: Secondary | ICD-10-CM | POA: Diagnosis not present

## 2020-04-26 DIAGNOSIS — J189 Pneumonia, unspecified organism: Secondary | ICD-10-CM | POA: Diagnosis not present

## 2020-04-26 DIAGNOSIS — J90 Pleural effusion, not elsewhere classified: Secondary | ICD-10-CM | POA: Diagnosis not present

## 2020-04-26 DIAGNOSIS — I255 Ischemic cardiomyopathy: Secondary | ICD-10-CM | POA: Diagnosis not present

## 2020-04-26 DIAGNOSIS — Z20822 Contact with and (suspected) exposure to covid-19: Secondary | ICD-10-CM | POA: Diagnosis not present

## 2020-04-27 ENCOUNTER — Encounter (HOSPITAL_COMMUNITY): Payer: Self-pay | Admitting: Emergency Medicine

## 2020-04-27 ENCOUNTER — Other Ambulatory Visit (HOSPITAL_COMMUNITY): Payer: Self-pay

## 2020-04-27 ENCOUNTER — Other Ambulatory Visit: Payer: Self-pay

## 2020-04-27 ENCOUNTER — Emergency Department (HOSPITAL_COMMUNITY): Payer: Medicare HMO

## 2020-04-27 ENCOUNTER — Inpatient Hospital Stay (HOSPITAL_COMMUNITY)
Admission: EM | Admit: 2020-04-27 | Discharge: 2020-05-05 | DRG: 193 | Disposition: A | Discharge status: Home / Self Care | Payer: Medicare HMO | Attending: Internal Medicine | Admitting: Internal Medicine

## 2020-04-27 DIAGNOSIS — I255 Ischemic cardiomyopathy: Secondary | ICD-10-CM | POA: Diagnosis present

## 2020-04-27 DIAGNOSIS — J9690 Respiratory failure, unspecified, unspecified whether with hypoxia or hypercapnia: Secondary | ICD-10-CM

## 2020-04-27 DIAGNOSIS — I2510 Atherosclerotic heart disease of native coronary artery without angina pectoris: Secondary | ICD-10-CM | POA: Diagnosis present

## 2020-04-27 DIAGNOSIS — Z7902 Long term (current) use of antithrombotics/antiplatelets: Secondary | ICD-10-CM

## 2020-04-27 DIAGNOSIS — J441 Chronic obstructive pulmonary disease with (acute) exacerbation: Secondary | ICD-10-CM | POA: Diagnosis not present

## 2020-04-27 DIAGNOSIS — E1165 Type 2 diabetes mellitus with hyperglycemia: Secondary | ICD-10-CM | POA: Diagnosis present

## 2020-04-27 DIAGNOSIS — E662 Morbid (severe) obesity with alveolar hypoventilation: Secondary | ICD-10-CM | POA: Diagnosis not present

## 2020-04-27 DIAGNOSIS — Z20822 Contact with and (suspected) exposure to covid-19: Secondary | ICD-10-CM | POA: Diagnosis not present

## 2020-04-27 DIAGNOSIS — Z6841 Body Mass Index (BMI) 40.0 and over, adult: Secondary | ICD-10-CM

## 2020-04-27 DIAGNOSIS — F17210 Nicotine dependence, cigarettes, uncomplicated: Secondary | ICD-10-CM | POA: Diagnosis present

## 2020-04-27 DIAGNOSIS — J90 Pleural effusion, not elsewhere classified: Secondary | ICD-10-CM | POA: Diagnosis not present

## 2020-04-27 DIAGNOSIS — J189 Pneumonia, unspecified organism: Principal | ICD-10-CM | POA: Diagnosis present

## 2020-04-27 DIAGNOSIS — J9621 Acute and chronic respiratory failure with hypoxia: Secondary | ICD-10-CM | POA: Diagnosis present

## 2020-04-27 DIAGNOSIS — I5031 Acute diastolic (congestive) heart failure: Secondary | ICD-10-CM | POA: Diagnosis not present

## 2020-04-27 DIAGNOSIS — Z881 Allergy status to other antibiotic agents status: Secondary | ICD-10-CM | POA: Diagnosis not present

## 2020-04-27 DIAGNOSIS — E875 Hyperkalemia: Secondary | ICD-10-CM | POA: Diagnosis not present

## 2020-04-27 DIAGNOSIS — I5023 Acute on chronic systolic (congestive) heart failure: Secondary | ICD-10-CM | POA: Diagnosis not present

## 2020-04-27 DIAGNOSIS — I252 Old myocardial infarction: Secondary | ICD-10-CM

## 2020-04-27 DIAGNOSIS — I5043 Acute on chronic combined systolic (congestive) and diastolic (congestive) heart failure: Secondary | ICD-10-CM | POA: Diagnosis not present

## 2020-04-27 DIAGNOSIS — Z79899 Other long term (current) drug therapy: Secondary | ICD-10-CM | POA: Diagnosis not present

## 2020-04-27 DIAGNOSIS — E119 Type 2 diabetes mellitus without complications: Secondary | ICD-10-CM | POA: Diagnosis not present

## 2020-04-27 DIAGNOSIS — D751 Secondary polycythemia: Secondary | ICD-10-CM | POA: Diagnosis not present

## 2020-04-27 DIAGNOSIS — R0602 Shortness of breath: Secondary | ICD-10-CM | POA: Diagnosis not present

## 2020-04-27 DIAGNOSIS — I517 Cardiomegaly: Secondary | ICD-10-CM | POA: Diagnosis not present

## 2020-04-27 DIAGNOSIS — Z7982 Long term (current) use of aspirin: Secondary | ICD-10-CM

## 2020-04-27 DIAGNOSIS — J9601 Acute respiratory failure with hypoxia: Secondary | ICD-10-CM | POA: Diagnosis present

## 2020-04-27 DIAGNOSIS — Z7984 Long term (current) use of oral hypoglycemic drugs: Secondary | ICD-10-CM | POA: Diagnosis not present

## 2020-04-27 DIAGNOSIS — Z720 Tobacco use: Secondary | ICD-10-CM | POA: Diagnosis present

## 2020-04-27 DIAGNOSIS — F40240 Claustrophobia: Secondary | ICD-10-CM | POA: Diagnosis present

## 2020-04-27 DIAGNOSIS — J9602 Acute respiratory failure with hypercapnia: Secondary | ICD-10-CM | POA: Diagnosis not present

## 2020-04-27 DIAGNOSIS — J440 Chronic obstructive pulmonary disease with acute lower respiratory infection: Secondary | ICD-10-CM | POA: Diagnosis present

## 2020-04-27 DIAGNOSIS — I509 Heart failure, unspecified: Secondary | ICD-10-CM | POA: Diagnosis not present

## 2020-04-27 DIAGNOSIS — J9610 Chronic respiratory failure, unspecified whether with hypoxia or hypercapnia: Secondary | ICD-10-CM | POA: Diagnosis not present

## 2020-04-27 DIAGNOSIS — Z885 Allergy status to narcotic agent status: Secondary | ICD-10-CM

## 2020-04-27 DIAGNOSIS — Z825 Family history of asthma and other chronic lower respiratory diseases: Secondary | ICD-10-CM | POA: Diagnosis not present

## 2020-04-27 DIAGNOSIS — J9622 Acute and chronic respiratory failure with hypercapnia: Secondary | ICD-10-CM | POA: Diagnosis present

## 2020-04-27 DIAGNOSIS — J449 Chronic obstructive pulmonary disease, unspecified: Secondary | ICD-10-CM | POA: Diagnosis not present

## 2020-04-27 DIAGNOSIS — Z8249 Family history of ischemic heart disease and other diseases of the circulatory system: Secondary | ICD-10-CM

## 2020-04-27 DIAGNOSIS — Z888 Allergy status to other drugs, medicaments and biological substances status: Secondary | ICD-10-CM

## 2020-04-27 DIAGNOSIS — J9811 Atelectasis: Secondary | ICD-10-CM | POA: Diagnosis not present

## 2020-04-27 DIAGNOSIS — I700 Atherosclerosis of aorta: Secondary | ICD-10-CM | POA: Diagnosis not present

## 2020-04-27 HISTORY — DX: Pneumonia, unspecified organism: J18.9

## 2020-04-27 HISTORY — DX: Chronic obstructive pulmonary disease, unspecified: J44.9

## 2020-04-27 HISTORY — DX: Heart failure, unspecified: I50.9

## 2020-04-27 HISTORY — DX: Atherosclerotic heart disease of native coronary artery without angina pectoris: I25.10

## 2020-04-27 LAB — HEPATIC FUNCTION PANEL
ALT: 49 U/L — ABNORMAL HIGH (ref 0–44)
AST: 23 U/L (ref 15–41)
Albumin: 3.3 g/dL — ABNORMAL LOW (ref 3.5–5.0)
Alkaline Phosphatase: 141 U/L — ABNORMAL HIGH (ref 38–126)
Bilirubin, Direct: 0.2 mg/dL (ref 0.0–0.2)
Indirect Bilirubin: 0.6 mg/dL (ref 0.3–0.9)
Total Bilirubin: 0.8 mg/dL (ref 0.3–1.2)
Total Protein: 6.7 g/dL (ref 6.5–8.1)

## 2020-04-27 LAB — BASIC METABOLIC PANEL
Anion gap: 12 (ref 5–15)
BUN: 33 mg/dL — ABNORMAL HIGH (ref 8–23)
CO2: 32 mmol/L (ref 22–32)
Calcium: 8.5 mg/dL — ABNORMAL LOW (ref 8.9–10.3)
Chloride: 95 mmol/L — ABNORMAL LOW (ref 98–111)
Creatinine, Ser: 1.05 mg/dL (ref 0.61–1.24)
GFR, Estimated: >60 mL/min (ref >60)
Glucose, Bld: 277 mg/dL — ABNORMAL HIGH (ref 70–99)
Potassium: 4.3 mmol/L (ref 3.5–5.1)
Sodium: 139 mmol/L (ref 135–145)

## 2020-04-27 LAB — GLUCOSE, CAPILLARY
Glucose-Capillary: 297 mg/dL — ABNORMAL HIGH (ref 70–99)
Glucose-Capillary: 188 mg/dL — ABNORMAL HIGH (ref 70–99)

## 2020-04-27 LAB — CBC
HCT: 58.8 % — ABNORMAL HIGH (ref 39.0–52.0)
Hemoglobin: 17 g/dL (ref 13.0–17.0)
MCH: 26.1 pg (ref 26.0–34.0)
MCHC: 28.9 g/dL — ABNORMAL LOW (ref 30.0–36.0)
MCV: 90.2 fL (ref 80.0–100.0)
Platelets: 211 10*3/uL (ref 150–400)
RBC: 6.52 MIL/uL — ABNORMAL HIGH (ref 4.22–5.81)
RDW: 17.8 % — ABNORMAL HIGH (ref 11.5–15.5)
WBC: 6.5 10*3/uL (ref 4.0–10.5)
nRBC: 0.6 % — ABNORMAL HIGH (ref 0.0–0.2)

## 2020-04-27 LAB — LACTIC ACID, PLASMA: Lactic Acid, Venous: 0.8 mmol/L (ref 0.5–1.9)

## 2020-04-27 LAB — RESP PANEL BY RT-PCR (FLU A&B, COVID) ARPGX2
Influenza A by PCR: NEGATIVE
Influenza B by PCR: NEGATIVE
SARS Coronavirus 2 by RT PCR: NEGATIVE

## 2020-04-27 LAB — BRAIN NATRIURETIC PEPTIDE: B Natriuretic Peptide: 149 pg/mL — ABNORMAL HIGH (ref 0.0–100.0)

## 2020-04-27 MED ORDER — CARVEDILOL 12.5 MG PO TABS: 12.5000 mg | ORAL_TABLET | Freq: Two times a day (BID) | ORAL | Status: DC

## 2020-04-27 MED ORDER — FUROSEMIDE 10 MG/ML IJ SOLN
40.0000 mg | Freq: Once | INTRAMUSCULAR | Status: AC
  Administered 2020-04-27: 40 mg via INTRAVENOUS
  Filled 2020-04-27: qty 4

## 2020-04-27 MED ORDER — ACETAMINOPHEN 325 MG PO TABS: 650.0000 mg | ORAL_TABLET | Freq: Four times a day (QID) | ORAL | Status: DC | PRN

## 2020-04-27 MED ORDER — IPRATROPIUM-ALBUTEROL 0.5-2.5 (3) MG/3ML IN SOLN: 3.0000 mL | RESPIRATORY_TRACT | Status: DC | PRN

## 2020-04-27 MED ORDER — ENOXAPARIN SODIUM 40 MG/0.4ML ~~LOC~~ SOLN
40.0000 mg | Freq: Every day | SUBCUTANEOUS | Status: DC
  Administered 2020-04-28: 40 mg via SUBCUTANEOUS
  Filled 2020-04-27: qty 0.4

## 2020-04-27 MED ORDER — ASPIRIN EC 81 MG PO TBEC
81.0000 mg | DELAYED_RELEASE_TABLET | Freq: Every day | ORAL | Status: DC
  Administered 2020-04-28 – 2020-05-05 (×8): 81 mg via ORAL
  Filled 2020-04-27 (×8): qty 1

## 2020-04-27 MED ORDER — CANAGLIFLOZIN 100 MG PO TABS
300.0000 mg | ORAL_TABLET | Freq: Every day | ORAL | Status: DC
  Administered 2020-04-29 – 2020-05-05 (×7): 300 mg via ORAL
  Filled 2020-04-27 (×4): qty 3
  Filled 2020-04-27: qty 1
  Filled 2020-04-27: qty 3
  Filled 2020-04-27: qty 1
  Filled 2020-04-27 (×2): qty 3
  Filled 2020-04-27 (×2): qty 1

## 2020-04-27 MED ORDER — PANTOPRAZOLE SODIUM 40 MG PO TBEC
40.0000 mg | DELAYED_RELEASE_TABLET | Freq: Every day | ORAL | Status: DC
  Administered 2020-04-28 – 2020-05-05 (×8): 40 mg via ORAL
  Filled 2020-04-27 (×8): qty 1

## 2020-04-27 MED ORDER — SACUBITRIL-VALSARTAN 24-26 MG PO TABS: 1.0000 | ORAL_TABLET | Freq: Two times a day (BID) | ORAL | Status: DC

## 2020-04-27 MED ORDER — ACETAMINOPHEN 650 MG RE SUPP: 650.0000 mg | Freq: Four times a day (QID) | RECTAL | Status: DC | PRN

## 2020-04-27 MED ORDER — ONDANSETRON HCL 4 MG PO TABS: 4.0000 mg | ORAL_TABLET | Freq: Four times a day (QID) | ORAL | Status: DC | PRN

## 2020-04-27 MED ORDER — POLYETHYLENE GLYCOL 3350 17 G PO PACK: 17.0000 g | PACK | Freq: Every day | ORAL | Status: DC | PRN

## 2020-04-27 MED ORDER — ATORVASTATIN CALCIUM 40 MG PO TABS
80.0000 mg | ORAL_TABLET | Freq: Every day | ORAL | Status: DC
  Administered 2020-04-28 – 2020-05-04 (×7): 80 mg via ORAL
  Filled 2020-04-27 (×7): qty 2

## 2020-04-27 MED ORDER — IPRATROPIUM-ALBUTEROL 0.5-2.5 (3) MG/3ML IN SOLN
3.0000 mL | Freq: Four times a day (QID) | RESPIRATORY_TRACT | Status: AC
  Administered 2020-04-28: 3 mL via RESPIRATORY_TRACT
  Filled 2020-04-27: qty 3

## 2020-04-27 MED ORDER — INSULIN ASPART 100 UNIT/ML ~~LOC~~ SOLN
0.0000 [IU] | Freq: Every day | SUBCUTANEOUS | Status: DC
  Administered 2020-05-01: 3 [IU] via SUBCUTANEOUS

## 2020-04-27 MED ORDER — SODIUM CHLORIDE 0.9 % IV SOLN
500.0000 mg | INTRAVENOUS | Status: DC
  Administered 2020-04-27 – 2020-05-02 (×6): 500 mg via INTRAVENOUS
  Filled 2020-04-27 (×6): qty 500

## 2020-04-27 MED ORDER — SODIUM CHLORIDE 0.9 % IV SOLN
2.0000 g | INTRAVENOUS | Status: DC
  Administered 2020-04-28 – 2020-05-02 (×5): 2 g via INTRAVENOUS
  Filled 2020-04-27 (×5): qty 20

## 2020-04-27 MED ORDER — ONDANSETRON HCL 4 MG/2ML IJ SOLN: 4.0000 mg | Freq: Four times a day (QID) | INTRAMUSCULAR | Status: DC | PRN

## 2020-04-27 MED ORDER — CLOPIDOGREL BISULFATE 75 MG PO TABS
75.0000 mg | ORAL_TABLET | Freq: Every day | ORAL | Status: DC
  Administered 2020-04-29 – 2020-05-05 (×7): 75 mg via ORAL
  Filled 2020-04-27 (×7): qty 1

## 2020-04-27 MED ORDER — GUAIFENESIN-DM 100-10 MG/5ML PO SYRP: 5.0000 mL | ORAL_SOLUTION | ORAL | Status: DC | PRN

## 2020-04-27 MED ORDER — INSULIN ASPART 100 UNIT/ML ~~LOC~~ SOLN
0.0000 [IU] | Freq: Three times a day (TID) | SUBCUTANEOUS | Status: DC
  Administered 2020-04-28 (×3): 2 [IU] via SUBCUTANEOUS
  Administered 2020-04-29: 3 [IU] via SUBCUTANEOUS
  Administered 2020-04-30: 8 [IU] via SUBCUTANEOUS
  Administered 2020-04-30: 3 [IU] via SUBCUTANEOUS
  Administered 2020-04-30 – 2020-05-01 (×2): 5 [IU] via SUBCUTANEOUS
  Administered 2020-05-01: 8 [IU] via SUBCUTANEOUS
  Administered 2020-05-01 – 2020-05-02 (×2): 5 [IU] via SUBCUTANEOUS

## 2020-04-27 MED ORDER — FUROSEMIDE 10 MG/ML IJ SOLN
40.0000 mg | Freq: Two times a day (BID) | INTRAMUSCULAR | Status: DC
  Administered 2020-04-28 – 2020-04-29 (×4): 40 mg via INTRAVENOUS
  Filled 2020-04-27 (×4): qty 4

## 2020-04-27 MED ORDER — ISOSORBIDE MONONITRATE ER 60 MG PO TB24: 60.0000 mg | ORAL_TABLET | Freq: Every day | ORAL | Status: DC

## 2020-04-27 MED ORDER — SODIUM CHLORIDE 0.9 % IV SOLN
1.0000 g | Freq: Once | INTRAVENOUS | Status: AC
  Administered 2020-04-27: 1 g via INTRAVENOUS
  Filled 2020-04-27: qty 10

## 2020-04-27 NOTE — ED Triage Notes (Addendum)
Pt to the ED with complaints of shortness of breath. The pt sstates he has PNU and was seen at [facility] yesterday. Pt left AMA.  Pt has oxygen saturations of 77 in triage on RA.  Pt placed on 2 liters of Oxygen and sat increased to 94. Pt tested negative for Covid yesterday.

## 2020-04-27 NOTE — ED Provider Notes (Signed)
[facility] Provider Note   CSN: [id number] Arrival date & time: 04/27/20  1029     History Chief Complaint  Patient presents with  . Shortness of Breath    Thomas [name] is a 65 y.o. male who presents with concern for shortness of breath secondary to known right lower lobe pneumonia. Patient has been on azithromycin and cefuroxime p.o. outpatient since Friday (04/24/20) from his primary care doctor for pneumonia and productive cough. Patient was evaluated in [place] ED yesterday due to concern for persistent shortness of breath, though there had been improvement of his cough. At that time patient was found to be hypoxic requiring BiPAP in the ED. Decision was made to admit the patient, however he did not like the way that he was being treated and left AMA from [facility] emergency department. He presents today with request to be provided with home O2 that he may return his home to recover.  He denies any chest pain or cough at this time, endorses shortness of breath and DOE. Denies palpitations. Endorses bilateral lower extremity edema worse than baseline for him. He states he has been taking his Lasix as prescribed, however is not urinating as much as he usually does when he is taking his Lasix. Denies any abdominal pain, nausea, vomiting, diarrhea. He denies fevers at home. Patient is not vaccinated against COVID-19, however he tested negative for COVID by PCR yesterday in the emergency department.  I personally reviewed the patient's medical records. History of diabetes mellitus, CHF, NSTEMI, morbid obesity, COPD, tobacco abuse.  HPI     Past Medical History:  Diagnosis Date  . CHF (congestive heart failure) ([place])   . COPD (chronic obstructive pulmonary disease) ([place])   . Coronary artery disease   . Morbid obesity ([place])   . NSTEMI (non-ST elevated myocardial infarction) ([place])    a. s/p cath in 03/2018 showing 90% OM2 stenosis and nonobstructive CAD along LAD  and LCx with medical management recommended  . Pneumonia   . Secondary cardiomyopathy ([place])    a. EF reported as 20-25% by echo in 03/2018 b. EF at 45% by repeat imaging in 10/2018  . Snoring   . Type 2 diabetes mellitus [facility])     Patient Active Problem List   Diagnosis Date Noted  . PNA (pneumonia) 04/27/2020  . Acute respiratory failure with hypoxia ([place]) 04/27/2020  . Loculated pleural effusion on right 12/19/2018  . Acute respiratory failure with hypoxia and hypercapnia ([place]) 12/14/2018  . Acute on chronic systolic CHF (congestive heart failure) ([place]) 12/14/2018  . Morbid obesity ([place]) 03/31/2018  . Ischemic cardiomyopathy 03/31/2018  . Tobacco abuse 03/31/2018  . DM (diabetes mellitus) ([place]) 03/31/2018  . NSTEMI (non-ST elevated myocardial infarction) ([place]) 03/29/2018    Past Surgical History:  Procedure Laterality Date  . LEFT HEART CATH AND CORONARY ANGIOGRAPHY N/A 03/30/2018   Procedure: LEFT HEART CATH AND CORONARY ANGIOGRAPHY;  Surgeon: Troy Sine, MD;  Location: [facility];  Service: Cardiovascular;  Laterality: N/A;       Family History  Problem Relation Age of Onset  . Emphysema Father   . Heart failure Father     Social History   Tobacco Use  . Smoking status: Current Every Day Smoker    Packs/day: 0.25    Types: Cigarettes    Start date: 02/21/1965  . Smokeless tobacco: Never Used  Vaping Use  . Vaping Use: Never used  Substance Use Topics  . Alcohol use:  Not Currently  . Drug use: Yes    Types: Marijuana    Comment: denies    Home Medications Prior to Admission medications   Medication Sig Start Date End Date Taking? Authorizing Provider  aspirin EC 81 MG tablet Take 81 mg by mouth daily.   Yes [provider]  atorvastatin (LIPITOR) 80 MG tablet TAKE 1 TABLET (80 MG TOTAL) BY MOUTH DAILY AT 6 PM. Patient taking differently: Take 80 mg by mouth every evening. 03/27/20  Yes Satira Sark, MD  azithromycin (ZITHROMAX)  500 MG tablet Take 500 mg by mouth daily.   Yes [provider]  canagliflozin (INVOKANA) 300 MG TABS tablet Take 300 mg by mouth daily before breakfast.   Yes [provider]  carvedilol (COREG) 12.5 MG tablet TAKE 1 TABLET (12.5 MG TOTAL) BY MOUTH 2 (TWO) TIMES DAILY. 11/13/19 02/20/20 Yes Satira Sark, MD  cefUROXime (CEFTIN) 250 MG tablet Take 250 mg by mouth 2 (two) times daily with a meal.   Yes [provider]  clopidogrel (PLAVIX) 75 MG tablet TAKE 1 TABLET (75 MG TOTAL) BY MOUTH DAILY WITH BREAKFAST. 09/19/19  Yes Satira Sark, MD  furosemide (LASIX) 20 MG tablet Take 1 tablet (20 mg total) by mouth 2 (two) times daily. 03/02/20  Yes Satira Sark, MD  ibuprofen (ADVIL) 200 MG tablet Take 200 mg by mouth every 6 (six) hours as needed for fever or mild pain.   Yes [provider]  isosorbide mononitrate (IMDUR) 60 MG 24 hr tablet Take 1 tablet (60 mg total) by mouth daily. 09/11/19 02/20/20 Yes Verta Ellen., NP  metFORMIN (GLUCOPHAGE-XR) 500 MG 24 hr tablet Take 1,000 mg by mouth 2 (two) times daily. 11/25/18  Yes [provider]  nitroGLYCERIN (NITROSTAT) 0.4 MG SL tablet Place 1 tablet (0.4 mg total) under the tongue every 5 (five) minutes x 3 doses as needed for chest pain (if no relief after 2nd dose, proceed to the ED for an evaluation or call 911). 02/20/20  Yes Satira Sark, MD  omeprazole (PRILOSEC) 40 MG capsule Take 40 mg by mouth daily.   Yes [provider]  potassium chloride (KLOR-CON) 10 MEQ tablet TAKE 1 TABLET BY MOUTH EVERY DAY Patient taking differently: Take 10 mEq by mouth daily. 12/16/19  Yes Satira Sark, MD  sacubitril-valsartan (ENTRESTO) 24-26 MG Take 1 tablet by mouth 2 (two) times daily. 03/10/20  Yes Satira Sark, MD    Allergies    Glipizide, Codeine, and Doxycycline  Review of Systems   Review of Systems  Constitutional: Positive for activity change. Negative for appetite  change, chills, diaphoresis, fatigue and fever.  HENT: Negative for congestion, sore throat, trouble swallowing and voice change.   Eyes: Negative.   Respiratory: Positive for cough and shortness of breath. Negative for chest tightness, wheezing and stridor.   Cardiovascular: Positive for leg swelling. Negative for chest pain and palpitations.  Gastrointestinal: Negative for abdominal pain, constipation, diarrhea, nausea, rectal pain and vomiting.  Genitourinary: Negative.   Musculoskeletal: Negative.   Skin: Negative.   Neurological: Negative.   Hematological: Negative.   Psychiatric/Behavioral: Negative.     Physical Exam Updated Vital Signs BP 127/73 (BP Location: Left Arm)   Pulse (!) 101   Temp 97.9 F (36.6 C) (Oral)   Resp 18   Ht 5\' 9"  (1.753 m)   Wt (!) 137.7 kg   SpO2 (!) 84%   BMI 44.83 kg/m  Physical Exam Vitals and nursing note reviewed.  Constitutional:      Appearance: He is obese. He is not diaphoretic.  HENT:     Head: Normocephalic and atraumatic.     Nose: Nose normal.     Mouth/Throat:     Mouth: Mucous membranes are moist.     Pharynx: Oropharynx is clear. Uvula midline. No oropharyngeal exudate, posterior oropharyngeal erythema or uvula swelling.     Tonsils: No tonsillar exudate.  Eyes:     General:        Right eye: No discharge.        Left eye: No discharge.     Extraocular Movements: Extraocular movements intact.     Conjunctiva/sclera: Conjunctivae normal.     Pupils: Pupils are equal, round, and reactive to light.  Neck:     Trachea: Trachea and phonation normal.  Cardiovascular:     Rate and Rhythm: Normal rate and regular rhythm.     Pulses:          Radial pulses are 2+ on the right side and 2+ on the left side.       Dorsalis pedis pulses are 1+ on the right side and 1+ on the left side.     Heart sounds: Heart sounds are distant. No murmur heard.     Comments: Cardiac exam complicated by patient body habitus.  Pulmonary:      Effort: Pulmonary effort is normal. No respiratory distress.     Breath sounds: Examination of the right-middle field reveals rhonchi. Examination of the right-lower field reveals rales. Decreased breath sounds, rhonchi and rales present. No wheezing.  Chest:     Chest wall: No deformity, swelling, tenderness, crepitus or edema.  Abdominal:     General: Abdomen is protuberant. Bowel sounds are normal. There is no distension.     Palpations: Abdomen is soft.     Tenderness: There is no abdominal tenderness. There is no guarding.  Musculoskeletal:        General: No deformity.     Cervical back: Neck supple. No rigidity or crepitus. No pain with movement, spinous process tenderness or muscular tenderness.     Right lower leg: 2+ Pitting Edema present.     Left lower leg: 2+ Pitting Edema present.  Lymphadenopathy:     Cervical: No cervical adenopathy.  Skin:    General: Skin is warm and dry.     Capillary Refill: Capillary refill takes less than 2 seconds.  Neurological:     General: No focal deficit present.     Mental Status: He is alert and oriented to person, place, and time. Mental status is at baseline.     Sensory: Sensation is intact.     Motor: Motor function is intact.  Psychiatric:        Mood and Affect: Mood normal.     ED Results / Procedures / Treatments   Labs (all labs ordered are listed, but only abnormal results are displayed) Labs Reviewed  BASIC METABOLIC PANEL - Abnormal; Notable for the following components:      Result Value   Chloride 95 (*)    Glucose, Bld 277 (*)    BUN 33 (*)    Calcium 8.5 (*)    All other components within normal limits  CBC - Abnormal; Notable for the following components:   RBC 6.52 (*)    HCT 58.8 (*)    MCHC 28.9 (*)    RDW 17.8 (*)  nRBC 0.6 (*)    All other components within normal limits  GLUCOSE, CAPILLARY - Abnormal; Notable for the following components:   Glucose-Capillary 297 (*)    All other components within  normal limits  HEPATIC FUNCTION PANEL - Abnormal; Notable for the following components:   Albumin 3.3 (*)    ALT 49 (*)    Alkaline Phosphatase 141 (*)    All other components within normal limits  BRAIN NATRIURETIC PEPTIDE - Abnormal; Notable for the following components:   B Natriuretic Peptide 149.0 (*)    All other components within normal limits  CULTURE, BLOOD (ROUTINE X 2)  CULTURE, BLOOD (ROUTINE X 2)  RESP PANEL BY RT-PCR (FLU A&B, COVID) ARPGX2  LACTIC ACID, PLASMA  HIV ANTIBODY (ROUTINE TESTING W REFLEX)  HEMOGLOBIN C1Y  BASIC METABOLIC PANEL    EKG EKG: normal sinus rhythm, no STEMI.   Radiology DG Chest 2 View  Result Date: 04/27/2020 CLINICAL DATA:  Short of breath EXAM: CHEST - 2 VIEW COMPARISON:  04/26/2020 FINDINGS: Right lower lobe airspace disease and small infiltrate unchanged. Left lung remains clear.  Negative for heart failure or edema. IMPRESSION: No interval change. Persistent right lower lobe infiltrate and effusion consistent with pneumonia. Electronically Signed   By: Franchot Gallo M.D.   On: 04/27/2020 11:36    Procedures Procedures (including critical care time)  Medications Ordered in ED Medications  azithromycin (ZITHROMAX) 500 mg in sodium chloride 0.9 % 250 mL IVPB ( Intravenous Rate/Dose Verify 04/27/20 2123)  cefTRIAXone (ROCEPHIN) 2 g in sodium chloride 0.9 % 100 mL IVPB (has no administration in time range)  aspirin EC tablet 81 mg (has no administration in time range)  atorvastatin (LIPITOR) tablet 80 mg (has no administration in time range)  canagliflozin (INVOKANA) tablet 300 mg (has no administration in time range)  carvedilol (COREG) tablet 12.5 mg (has no administration in time range)  clopidogrel (PLAVIX) tablet 75 mg (has no administration in time range)  isosorbide mononitrate (IMDUR) 24 hr tablet 60 mg (has no administration in time range)  pantoprazole (PROTONIX) EC tablet 40 mg (has no administration in time range)   sacubitril-valsartan (ENTRESTO) 24-26 mg per tablet (has no administration in time range)  insulin aspart (novoLOG) injection 0-15 Units (has no administration in time range)  insulin aspart (novoLOG) injection 0-5 Units (has no administration in time range)  furosemide (LASIX) injection 40 mg (has no administration in time range)  enoxaparin (LOVENOX) injection 40 mg (has no administration in time range)  acetaminophen (TYLENOL) tablet 650 mg (has no administration in time range)    Or  acetaminophen (TYLENOL) suppository 650 mg (has no administration in time range)  ondansetron (ZOFRAN) tablet 4 mg (has no administration in time range)    Or  ondansetron (ZOFRAN) injection 4 mg (has no administration in time range)  polyethylene glycol (MIRALAX / GLYCOLAX) packet 17 g (has no administration in time range)  guaiFENesin-dextromethorphan (ROBITUSSIN DM) 100-10 MG/5ML syrup 5 mL (has no administration in time range)  ipratropium-albuterol (DUONEB) 0.5-2.5 (3) MG/3ML nebulizer solution 3 mL (has no administration in time range)  ipratropium-albuterol (DUONEB) 0.5-2.5 (3) MG/3ML nebulizer solution 3 mL (has no administration in time range)  cefTRIAXone (ROCEPHIN) 1 g in sodium chloride 0.9 % 100 mL IVPB (0 g Intravenous Stopping Infusion hung by another clincian 04/27/20 2024)  furosemide (LASIX) injection 40 mg (40 mg Intravenous Given 04/27/20 1935)    ED Course  I have reviewed the triage vital signs and the nursing  notes.  Pertinent labs & imaging results that were available during my care of the patient were reviewed by me and considered in my medical decision making (see chart for details).    MDM Rules/Calculators/A&P                         65 year old male with history of CHF and COPD who presents with concern for progressive shortness of breath in context of known right lower lobe pneumonia.  Patient has been on cefuroxime and azithromycin x4 days, PO.  On intake, patient was found  to be hypoxic on room air to 77% oxygen saturation.  Vital signs were otherwise normal.  Saturations improved after administration of supplemental oxygen via nasal cannula.  Patient is now requiring 4 L to maintain saturations over 90%.  Physical exam significant for decreased breath sounds in the right lower lobe, as well as decreased air movement throughout lungs fields bilaterally.  Cardiopulmonary exam complicated by patient body habitus.  Patient is not tachycardic or hypotensive at this time.  Does not meet sepsis criteria.  Basic laboratory studies obtained in triage.  CBC without leukocytosis, BMP with hyperglycemia, otherwise at patient baseline. Chest x-ray with persistent right lower lobe infiltrate and effusion consistent with pneumonia.  Will proceed with ambulatory oxygen saturation, IV antibiotics to cover CAP, will add on lactic acid, hepatic function panel, blood cultures, as well as respiratory pathogen panel.  Patient seen by attending physician as well at the bedside.  We will proceed with admission to the hospital for hypoxia secondary to pneumonia.  Patient is amenable to this plan.  Consult placed to hospitalist, Dr. Denton Brick, who is agreeable to seeing this patient in the emergency department and admitting him to her service.  I appreciate your collaboration in the care of this patient.  Thomas [name] voiced understanding of his medical evaluation and treatment plan.  His questions were answered to his expressed satisfaction.  He is amenable to plan for admission at this time.  Thomas [name] was evaluated in Emergency Department on 04/27/2020 for the symptoms described in the history of present illness. He was evaluated in the context of the global COVID-19 pandemic, which necessitated consideration that the patient might be at risk for infection with the SARS-CoV-2 virus that causes COVID-19. Institutional protocols and algorithms that pertain to the evaluation of patients at risk for  COVID-19 are in a state of rapid change based on information released by regulatory bodies including the CDC and federal and state organizations. These policies and algorithms were followed during the patient's care in the ED.  Final Clinical Impression(s) / ED Diagnoses Final diagnoses:  None    Rx / DC Orders ED Discharge Orders    None       Aura Dials 04/27/20 2312    Isla Pence, MD 04/28/20 [phone]

## 2020-04-27 NOTE — Clinical Social Work Note (Signed)
TOC consulted due to pt needing home O2. CSW reached out to [employer] who states that they are not in network with [facility]. CSW spoke with Barbaraann Rondo with [employer], they are able to process the referral however due to pt being in ED not currently admitted there is not a guarantee that the insurance will accept and pt will then have to pay out of pocket.   CSW spoke with Attending Dr. Gilford Raid in ED about pt. CSW updated that pt is being admitted. CSW updated Barbaraann Rondo with [employer], he will f/u with TOC and keep pt on list for referral. TOC to follow.

## 2020-04-27 NOTE — H&P (Addendum)
History and Physical    Thomas [name] CXK:[id number] DOB: 08-09-1955 DOA: 04/27/2020  PCP: Leonie Douglas, MD   Patient coming from: Home  I have personally briefly reviewed patient's old medical records in [place]  Chief Complaint: SOB  HPI: Thomas [name] is a 65 y.o. male with medical history significant for systolic CHF/ischemic cardiomyopathy, COPD, diabetes mellitus. Patient presented to the ED with complaints of increasing difficulty breathing over the past 2 months, productive cough.  He was evaluated by his primary care doctor on Friday, 3 days ago, 1/7, prescribed a course of antibiotics for his pneumonia, azithromycin and cefuroxime and breathing treatments.  Patient started medications next day and today was his 3rd dose.  Reports his symptoms are not worse.  Patient also reports bilateral lower extremity swelling over the past several weeks to months.  He reports compliance with Lasix 40 mg in the morning and 20 mg at night. Patient also reports recent weight gain, reports normal weight within the past week- 282 lb, weight today 300Lbs.   Vaccinated for COVID x 3.  Details from care everywhere- Patient presented to [facility] yesterday, he had a CT scan of his chest with contrast which showed the infiltrate in the anterior right lower lobe along the fissure previously is more bandlike today and could represent chronic  atelectasis, scarring, or less likely recurrence of infiltrate.  There is no identified central mass to explain this bandlike  opacity. Recommend attention on follow-up. The right middle lobe infiltrate on the previous study has nearly  resolved with only mild atelectasis remaining right anterior lower lobe pneumonia, with moderate right pleural effusion and associated atelectasis.    Admission was offered for CHF decompensation and pneumonia, but patient left AMA.  ED Course: O2 sats 77 %, on room air.  On 4 L.  Sats 89 to 92%.  Temperature 97.9.   WBC 6.5.  Portable chest x-ray shows right lower lobe infiltrate and effusion consistent with pneumonia. Ceftriaxone and azithromycin started in the ED.  Hospitalist to admit for further evaluation and management.  Review of Systems: As per HPI all other systems reviewed and negative.  Past Medical History:  Diagnosis Date  . CHF (congestive heart failure) ([place])   . COPD (chronic obstructive pulmonary disease) ([place])   . Coronary artery disease   . Morbid obesity ([place])   . NSTEMI (non-ST elevated myocardial infarction) ([place])    a. s/p cath in 03/2018 showing 90% OM2 stenosis and nonobstructive CAD along LAD and LCx with medical management recommended  . Pneumonia   . Secondary cardiomyopathy ([place])    a. EF reported as 20-25% by echo in 03/2018 b. EF at 45% by repeat imaging in 10/2018  . Snoring   . Type 2 diabetes mellitus ([place])     Past Surgical History:  Procedure Laterality Date  . LEFT HEART CATH AND CORONARY ANGIOGRAPHY N/A 03/30/2018   Procedure: LEFT HEART CATH AND CORONARY ANGIOGRAPHY;  Surgeon: Troy Sine, MD;  Location: [place] [facility];  Service: Cardiovascular;  Laterality: N/A;     reports that he has been smoking cigarettes. He started smoking about 55 years ago. He has been smoking about 0.25 packs per day. He has never used smokeless tobacco. He reports previous alcohol use. He reports current drug use. Drug: Marijuana.  Allergies  Allergen Reactions  . Glipizide Palpitations  . Codeine Nausea And Vomiting  . Doxycycline     Family History  Problem Relation Age of  Onset  . Emphysema Father   . Heart failure Father     Prior to Admission medications   Medication Sig Start Date End Date Taking? Authorizing Provider  alum & mag hydroxide-simeth (MAALOX/MYLANTA) 200-200-20 MG/5ML suspension Take 30 mLs by mouth every 2 (two) hours as needed for indigestion. 12/22/18   Debbe Odea, MD  aspirin EC 81 MG tablet Take 81 mg by mouth daily.    [provider]  atorvastatin (LIPITOR) 80 MG tablet TAKE 1 TABLET (80 MG TOTAL) BY MOUTH DAILY AT 6 PM. 03/27/20   Satira Sark, MD  canagliflozin (INVOKANA) 300 MG TABS tablet Take 300 mg by mouth daily before breakfast.    [provider]  carvedilol (COREG) 12.5 MG tablet TAKE 1 TABLET (12.5 MG TOTAL) BY MOUTH 2 (TWO) TIMES DAILY. 11/13/19 02/20/20  Satira Sark, MD  clopidogrel (PLAVIX) 75 MG tablet TAKE 1 TABLET (75 MG TOTAL) BY MOUTH DAILY WITH BREAKFAST. 09/19/19   Satira Sark, MD  furosemide (LASIX) 20 MG tablet Take 1 tablet (20 mg total) by mouth 2 (two) times daily. 03/02/20   Satira Sark, MD  ibuprofen (ADVIL) 200 MG tablet Take 200 mg by mouth every 6 (six) hours as needed.    [provider]  isosorbide mononitrate (IMDUR) 60 MG 24 hr tablet Take 1 tablet (60 mg total) by mouth daily. 09/11/19 02/20/20  Verta Ellen., NP  metFORMIN (GLUCOPHAGE-XR) 500 MG 24 hr tablet Take 1,000 mg by mouth 2 (two) times daily. 11/25/18   [provider]  nitroGLYCERIN (NITROSTAT) 0.4 MG SL tablet Place 1 tablet (0.4 mg total) under the tongue every 5 (five) minutes x 3 doses as needed for chest pain (if no relief after 2nd dose, proceed to the ED for an evaluation or call 911). 02/20/20   Satira Sark, MD  omeprazole (PRILOSEC) 40 MG capsule Take 40 mg by mouth daily.    [provider]  potassium chloride (KLOR-CON) 10 MEQ tablet TAKE 1 TABLET BY MOUTH EVERY DAY 12/16/19   Satira Sark, MD  sacubitril-valsartan (ENTRESTO) 24-26 MG Take 1 tablet by mouth 2 (two) times daily. 03/10/20   Satira Sark, MD    Physical Exam: Vitals:   04/27/20 1051 04/27/20 1056 04/27/20 1630  BP: (!) 105/49  121/62  Pulse: 90  82  Resp: 20    Temp: 97.9 F (36.6 C)    TempSrc: Oral    SpO2: (!) 77%  90%  Weight:  136.1 kg   Height:  5\' 9"  (1.753 m)     Constitutional: calm, comfortable Vitals:   04/27/20 1051 04/27/20 1056 04/27/20  1630  BP: (!) 105/49  121/62  Pulse: 90  82  Resp: 20    Temp: 97.9 F (36.6 C)    TempSrc: Oral    SpO2: (!) 77%  90%  Weight:  136.1 kg   Height:  5\' 9"  (1.753 m)    Eyes: PERRL, lids and conjunctivae normal ENMT: Mucous membranes are moist.  Neck: normal, supple, no masses, no thyromegaly Respiratory: Very faint/mild scattered expiratory wheezes, no crackles. Normal respiratory effort. No accessory muscle use.  Cardiovascular: Regular rate and rhythm, heart sounds difficult to appreciate due to habitus, 1+ pitting extremity edema to knees,.  Both feet warm and well perfused. Abdomen: Obese, no tenderness, no masses palpated. No hepatosplenomegaly. Bowel sounds positive.  Musculoskeletal: no clubbing / cyanosis. No joint deformity upper and lower extremities. Good ROM, no contractures.  Normal muscle tone.  Skin: no rashes, lesions, ulcers. No induration Neurologic: No apparent cranial nerve abnormalities, moving extremities spontaneously. Psychiatric: Normal judgment and insight. Alert and oriented x 3. Normal mood.   Labs on Admission: I have personally reviewed following labs and imaging studies  CBC: Recent Labs  Lab 04/27/20 1109  WBC 6.5  HGB 17.0  HCT 58.8*  MCV 90.2  PLT 782   Basic Metabolic Panel: Recent Labs  Lab 04/27/20 1109  NA 139  K 4.3  CL 95*  CO2 32  GLUCOSE 277*  BUN 33*  CREATININE 1.05  CALCIUM 8.5*   CBG: Recent Labs  Lab 04/27/20 1101  GLUCAP 297*    Radiological Exams on Admission: DG Chest 2 View  Result Date: 04/27/2020 CLINICAL DATA:  Short of breath EXAM: CHEST - 2 VIEW COMPARISON:  04/26/2020 FINDINGS: Right lower lobe airspace disease and small infiltrate unchanged. Left lung remains clear.  Negative for heart failure or edema. IMPRESSION: No interval change. Persistent right lower lobe infiltrate and effusion consistent with pneumonia. Electronically Signed   By: Franchot Gallo M.D.   On: 04/27/2020 11:36    EKG:  Independently reviewed.  Sinus.  Rate 92.  QTc 462.  No significant change from prior.  Assessment/Plan Principal Problem:   PNA (pneumonia) Active Problems:   Acute respiratory failure with hypoxia (HCC)   Acute on chronic systolic CHF (congestive heart failure) (HCC)   Morbid obesity (HCC)   Ischemic cardiomyopathy   Tobacco abuse   DM (diabetes mellitus) ([place])  Acute hypoxic respiratory failure-O2 sats 77% on room air, currently on 4 L with sats 89-92 %.  Likely due to decompensated CHF, and pneumonia and likely combination of baseline obstructive lung disease. Supposed to be on CPAP, but he is claustrophobic so does not use it. 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  He reports weight gain baseline (? Accuracy) 282-300 lbs today. BNP elevated at 149, compared to 78 1 yr ago. LAst echo 10/21-  diffcult study but shows probable EF 50%, this has improved from EF 20- 25% in 2019, when he had an NSTEMI.  -IV Lasix 40 mg twice daily -Strict input output, daily weights -Obtain BNP - Daily BMP -Hold home Lasix 40-20 mg  Pneumonia -not septic.  Appears to be resolving, chest x-ray today shows right lower lobe pneumonia, but chest CT done at [facility] yesterday- right middle lobe infiltrate on the previous study has nearly resolved with only mild atelectasis remaining.  Started outpatient antibiotic course 1/8, with cefuroxime and azithromycin. -Day 3 of antibiotics, complete at least 5-day course with IV ceftriaxone and azithromycin -Follow-up blood cultures ordered in ED  COPD -very faint scattered expiratory wheezing on auscultation. -DuoNebs as needed and scheduled. - PRN Mucolytics  Ischemic cardiomyopathy-history of  NSTEMI 2019.  Cardiac cath revealed diffuse disease in second marginal branch with 90% stenosis.  Medical therapy was recommended with medications to  include dual antiplatelets though stenting was not performed, Follows with Dr. Conni Elliot. -Resume home medications-aspirin, Plavix, carvedilol, Imdur, atorvastatin, Entresto  Diabetes mellitus-random glucose 277, patient is requesting a regular diet declines carb modified diet, I have explained that this could affect his blood sugars. -Obtain hemoglobin A1c -Resume home Invokana -Hold home Metformin -SSI- M  Tobacco abuse-reports he has cut down his smoking to 2 cigarettes daily.  Morbid obesity BMI 44  DVT prophylaxis: Lovenox Code  Status: Full code Family Communication: None at bedside Disposition Plan: ~ 2 days, ending improvement in respiratory status and response to diuretics.  Consults called: None Admission status: Inpatient, telemetry I certify that at the point of admission it is my clinical judgment that the patient will require inpatient hospital care spanning beyond 2 midnights from the point of admission due to high intensity of service, high risk for further deterioration and high frequency of surveillance required. The following factors support the patient status of inpatient: Volume overload and treatment of residual pneumonia.   Bethena Roys MD [facility]  04/27/2020, 7:28 PM

## 2020-04-28 ENCOUNTER — Inpatient Hospital Stay (HOSPITAL_COMMUNITY): Payer: Medicare HMO

## 2020-04-28 ENCOUNTER — Inpatient Hospital Stay: Payer: Self-pay

## 2020-04-28 DIAGNOSIS — I255 Ischemic cardiomyopathy: Secondary | ICD-10-CM | POA: Diagnosis not present

## 2020-04-28 DIAGNOSIS — I5031 Acute diastolic (congestive) heart failure: Secondary | ICD-10-CM | POA: Diagnosis not present

## 2020-04-28 DIAGNOSIS — J9602 Acute respiratory failure with hypercapnia: Secondary | ICD-10-CM

## 2020-04-28 DIAGNOSIS — J189 Pneumonia, unspecified organism: Principal | ICD-10-CM

## 2020-04-28 DIAGNOSIS — J9601 Acute respiratory failure with hypoxia: Secondary | ICD-10-CM | POA: Diagnosis not present

## 2020-04-28 LAB — BLOOD GAS, ARTERIAL
Acid-Base Excess: 10.9 mmol/L — ABNORMAL HIGH (ref 0.0–2.0)
Acid-Base Excess: 11.1 mmol/L — ABNORMAL HIGH (ref 0.0–2.0)
Acid-Base Excess: 8.8 mmol/L — ABNORMAL HIGH (ref 0.0–2.0)
Bicarbonate: 25.7 mmol/L (ref 20.0–28.0)
Bicarbonate: 27.9 mmol/L (ref 20.0–28.0)
Bicarbonate: 29.9 mmol/L — ABNORMAL HIGH (ref 20.0–28.0)
Delivery systems: POSITIVE
Drawn by: 21310
Drawn by: 35043
Expiratory PAP: 7
FIO2: 100
FIO2: 100
FIO2: 100
Inspiratory PAP: 23
O2 Saturation: 52 %
O2 Saturation: 81.2 %
O2 Saturation: 93.8 %
Patient temperature: 36.4
Patient temperature: 36.8
Patient temperature: 36.8
RATE: 20 resp/min
pCO2 arterial: 106 mmHg (ref 32.0–48.0)
pCO2 arterial: 115 mmHg (ref 32.0–48.0)
pCO2 arterial: 90.9 mmHg (ref 32.0–48.0)
pH, Arterial: 7.139 — CL (ref 7.350–7.450)
pH, Arterial: 7.192 — CL (ref 7.350–7.450)
pH, Arterial: 7.246 — ABNORMAL LOW (ref 7.350–7.450)
pO2, Arterial: 32.1 mmHg — CL (ref 83.0–108.0)
pO2, Arterial: 55.5 mmHg — ABNORMAL LOW (ref 83.0–108.0)
pO2, Arterial: 81.9 mmHg — ABNORMAL LOW (ref 83.0–108.0)

## 2020-04-28 LAB — BASIC METABOLIC PANEL
Anion gap: 17 — ABNORMAL HIGH (ref 5–15)
Anion gap: 11 (ref 5–15)
BUN: 31 mg/dL — ABNORMAL HIGH (ref 8–23)
BUN: 26 mg/dL — ABNORMAL HIGH (ref 8–23)
CO2: 26 mmol/L (ref 22–32)
CO2: 35 mmol/L — ABNORMAL HIGH (ref 22–32)
Calcium: 8.3 mg/dL — ABNORMAL LOW (ref 8.9–10.3)
Calcium: 8.4 mg/dL — ABNORMAL LOW (ref 8.9–10.3)
Chloride: 98 mmol/L (ref 98–111)
Chloride: 98 mmol/L (ref 98–111)
Creatinine, Ser: 1.01 mg/dL (ref 0.61–1.24)
Creatinine, Ser: 0.72 mg/dL (ref 0.61–1.24)
GFR, Estimated: >60 mL/min (ref >60)
GFR, Estimated: >60 mL/min (ref >60)
Glucose, Bld: 160 mg/dL — ABNORMAL HIGH (ref 70–99)
Glucose, Bld: 111 mg/dL — ABNORMAL HIGH (ref 70–99)
Potassium: 5.9 mmol/L — ABNORMAL HIGH (ref 3.5–5.1)
Potassium: 4.1 mmol/L (ref 3.5–5.1)
Sodium: 141 mmol/L (ref 135–145)
Sodium: 144 mmol/L (ref 135–145)

## 2020-04-28 LAB — ECHOCARDIOGRAM LIMITED
Area-P 1/2: 2.61 cm2
Height: 69 in
S' Lateral: 4.76 cm
Weight: 4867.76 oz

## 2020-04-28 LAB — TROPONIN I (HIGH SENSITIVITY)
Troponin I (High Sensitivity): 14 ng/L (ref <18)
Troponin I (High Sensitivity): 15 ng/L (ref <18)

## 2020-04-28 LAB — HEMOGLOBIN A1C
Hgb A1c MFr Bld: 8.8 % — ABNORMAL HIGH (ref 4.8–5.6)
Mean Plasma Glucose: 205.86 mg/dL

## 2020-04-28 LAB — BLOOD GAS, VENOUS
Acid-Base Excess: 10.3 mmol/L — ABNORMAL HIGH (ref 0.0–2.0)
Bicarbonate: 31.1 mmol/L — ABNORMAL HIGH (ref 20.0–28.0)
FIO2: 60
O2 Saturation: 80.2 %
Patient temperature: 36.2
pCO2, Ven: 64.7 mmHg — ABNORMAL HIGH (ref 44.0–60.0)
pH, Ven: 7.363 (ref 7.250–7.430)
pO2, Ven: 43.3 mmHg (ref 32.0–45.0)

## 2020-04-28 LAB — GLUCOSE, CAPILLARY
Glucose-Capillary: 130 mg/dL — ABNORMAL HIGH (ref 70–99)
Glucose-Capillary: 135 mg/dL — ABNORMAL HIGH (ref 70–99)
Glucose-Capillary: 135 mg/dL — ABNORMAL HIGH (ref 70–99)
Glucose-Capillary: 155 mg/dL — ABNORMAL HIGH (ref 70–99)
Glucose-Capillary: 274 mg/dL — ABNORMAL HIGH (ref 70–99)

## 2020-04-28 LAB — HIV ANTIBODY (ROUTINE TESTING W REFLEX): HIV Screen 4th Generation wRfx: NONREACTIVE

## 2020-04-28 MED ORDER — DEXTROSE 50 % IV SOLN
25.0000 g | Freq: Once | INTRAVENOUS | Status: AC
  Administered 2020-04-28: 25 g via INTRAVENOUS
  Filled 2020-04-28: qty 50

## 2020-04-28 MED ORDER — CHLORHEXIDINE GLUCONATE CLOTH 2 % EX PADS
6.0000 | MEDICATED_PAD | Freq: Every day | CUTANEOUS | Status: DC
  Administered 2020-04-29 – 2020-05-04 (×4): 6 via TOPICAL

## 2020-04-28 MED ORDER — PERFLUTREN LIPID MICROSPHERE
1.0000 mL | INTRAVENOUS | Status: AC | PRN
  Administered 2020-04-28: 2 mL via INTRAVENOUS
  Filled 2020-04-28: qty 10

## 2020-04-28 MED ORDER — INSULIN ASPART 100 UNIT/ML IV SOLN
10.0000 [IU] | Freq: Once | INTRAVENOUS | Status: AC
  Administered 2020-04-28: 10 [IU] via INTRAVENOUS

## 2020-04-28 MED ORDER — SODIUM POLYSTYRENE SULFONATE 15 GM/60ML PO SUSP
30.0000 g | Freq: Once | ORAL | Status: AC
  Administered 2020-04-28: 30 g via RECTAL
  Filled 2020-04-28: qty 120

## 2020-04-28 MED ORDER — FUROSEMIDE 10 MG/ML IJ SOLN
40.0000 mg | Freq: Once | INTRAMUSCULAR | Status: AC
  Administered 2020-04-28: 40 mg via INTRAVENOUS
  Filled 2020-04-28: qty 4

## 2020-04-28 NOTE — Progress Notes (Addendum)
Rapid Response called at 0025 due to patient being hypoxic.  Patient placed on non-rebreather when previously patient had been on 8l nasal cannula.   ICU nurse, respiratory, and MD arrived to room.  At 0030 ABG done, CBG 275.  At 0035 vitals were 123/50 97.5, pulse 100 and 84% on  [place].   Patient is lethargic but responsive to painful stimuli.   At [place], labs drawn and taken to lab and ekg performed.  0050, labs called back and patient had critical lab values, and were read back to md in room. Another iv access started.   MD ordered for transfer to icu for further monitoring.  At [place] patient transferred to icu.  Patient is still lethargic, but is becoming combative with staff interaction.

## 2020-04-28 NOTE — Progress Notes (Signed)
Notified patient spouse of patient transfer to ICU.

## 2020-04-28 NOTE — Progress Notes (Signed)
Informed on call MD concerning patient's fluctuating sats.

## 2020-04-28 NOTE — Progress Notes (Signed)
Rapid response called at 0025 for increased O2 requirement and lethargy. Pt evaluated at bedside. Pt satting at 84% on high flow cannula and NRB. Breath sounds diminished in lower lung fields, tachycardic. EKG shows sinus tach. ABG done that revealed pH 7.0 and CO2 115. Patient started on Bipap. Please see nursing notes for further details.

## 2020-04-28 NOTE — Progress Notes (Signed)
Patient's daughter contacted in regards to giving consent for patient to have a thoracentesis. Gave her the number to call : [phone] to inquire details about the thoracentesis as well as to give consent. Patient unable to consent at this time due to altered mental status.

## 2020-04-28 NOTE — Progress Notes (Signed)
*  PRELIMINARY RESULTS* Echocardiogram 2D Echocardiogram with definity has been performed.  Thomas [name] 04/28/2020, 10:44 AM

## 2020-04-28 NOTE — Progress Notes (Signed)
Patient's RN called me to inform this RT that patient's sats had dropped.  Placed patient on Bipap after 10 pm, patient is already at 100% FIO2.  Asked RN to see if MD would be willing to order another administration of lasix since it helped the patient earlier this morning.  Will continue to monitor.

## 2020-04-28 NOTE — Progress Notes (Addendum)
PROGRESS NOTE    CULLEN LAHAIE  TTS:[id number] DOB: Sep 01, 1955 DOA: 04/27/2020 PCP: Leonie Douglas, MD   Brief Narrative:  ANTAEUS KAREL is a 65 y.o. male with medical history significant for systolic CHF/ischemic cardiomyopathy, COPD, diabetes mellitus. Patient presented to the ED with complaints of increasing difficulty breathing over the past 2 months, productive cough.  Patient was initially admitted with acute hypoxemic respiratory failure that appears multifactorial in the setting of pneumonia and COPD as well as acute on chronic heart failure.  Unfortunately, he has had worsening oxygen saturations and dyspnea noted overnight on 1/11 prompting transfer to ICU and placement on BiPAP.  He is noted to have worsening pleural effusions on chest x-ray with thoracentesis ordered.  Assessment & Plan:   Principal Problem:   PNA (pneumonia) Active Problems:   Morbid obesity ([place])   Ischemic cardiomyopathy   Tobacco abuse   DM (diabetes mellitus) ([place])   Acute on chronic systolic CHF (congestive heart failure) (HCC)   Acute respiratory failure with hypoxia (HCC)   Acute hypoxic and hypercapnic respiratory failure-worsening -Appears to be multifactorial in the setting of worsening CHF as well as COPD with pneumonia -Noted bilateral pleural effusions on chest x-ray and will plan thoracentesis -Continue on BiPAP -Appreciate pulmonology evaluation   Acute on chronic diastolic congestive heart failure-  -Echo in 10/21 with LVEF 50%, repeat limited echo to reevaluate, now with worsening failure and pleural effusions -IV Lasix 40 mg twice daily for SBP greater than 100 -Strict input output, daily weights - Daily BMP -Plan for thoracentesis due to worsening effusions  Hyperkalemia -Planning to address with lactulose via rectal tube -D50 and insulin to be given -Repeat BMP this afternoon and recheck as well in a.m.  Pneumonia -not septic.  Appears to be resolving, chest x-ray  today shows right lower lobe pneumonia, but chest CT done at [facility] yesterday- right middle lobe infiltrate on the previous study has nearly resolved with only mild atelectasis remaining.  Started outpatient antibiotic course 1/8, with cefuroxime and azithromycin. -Day 3 of antibiotics, complete at least 5-day course with IV ceftriaxone and azithromycin -Follow-up blood cultures ordered in ED, pending  COPD -very faint scattered expiratory wheezing on auscultation. -DuoNebs as needed and scheduled. - PRN Mucolytics  Ischemic cardiomyopathy-history of  NSTEMI 2019.  Cardiac cath revealed diffuse disease in second marginal branch with 90% stenosis.  Medical therapy was recommended with medications to include dual antiplatelets though stenting was not performed, Follows with Dr. Conni Elliot. -Resume home medications-aspirin, Plavix, carvedilol, Imdur, atorvastatin, Entresto -Currently cannot take these medications as he is on backup  Diabetes mellitus-hyperglycemia improved -Obtain hemoglobin A1c, pending -, Being held at this time -Hold home Metformin -SSI- M, continued for now given adequate blood glucose readings  Tobacco abuse-reports he has cut down his smoking to 2 cigarettes daily.  Morbid obesity BMI 44   DVT prophylaxis:Lovenox Code Status: Full Family Communication: Tried calling spouse with no response 1/11 Disposition Plan:  Status is: Inpatient  Remains inpatient appropriate because:Unsafe d/c plan, IV treatments appropriate due to intensity of illness or inability to take PO and Inpatient level of care appropriate due to severity of illness   Dispo: The patient is from: Home              Anticipated d/c is to: Home              Anticipated d/c date is: > 3 days  Patient currently is not medically stable to d/c.  Patient reports some ongoing diuresis as well as thoracentesis today.  Currently on BiPAP.   Consultants:    Pulmonology  Procedures:   See below  Antimicrobials:  Anti-infectives (From admission, onward)   Start     Dose/Rate Route Frequency Ordered Stop   04/28/20 1700  cefTRIAXone (ROCEPHIN) 2 g in sodium chloride 0.9 % 100 mL IVPB        2 g 200 mL/hr over 30 Minutes Intravenous Every 24 hours 04/27/20 2140     04/27/20 1730  cefTRIAXone (ROCEPHIN) 1 g in sodium chloride 0.9 % 100 mL IVPB        1 g 200 mL/hr over 30 Minutes Intravenous  Once 04/27/20 1719 04/27/20 2024   04/27/20 1730  azithromycin (ZITHROMAX) 500 mg in sodium chloride 0.9 % 250 mL IVPB        500 mg 250 mL/hr over 60 Minutes Intravenous Every 24 hours 04/27/20 1719         Subjective: Patient seen and evaluated today with worsening respiratory status noted overnight.  He is currently on BiPAP and is responsive to questioning.  Objective: Vitals:   04/28/20 0700 04/28/20 0734 04/28/20 0738 04/28/20 0800  BP: (!) 109/58   (!) 91/30  Pulse: 85   80  Resp: (!) 22   18  Temp:  98.6 F (37 C)    TempSrc:  Axillary    SpO2: 93%  94% 93%  Weight:      Height:        Intake/Output Summary (Last 24 hours) at 04/28/2020 5188 Last data filed at 04/28/2020 0800 Gross per 24 hour  Intake 251.28 ml  Output 875 ml  Net -623.72 ml   Filed Weights   04/27/20 2139 04/28/20 0138 04/28/20 0500  Weight: (!) 137.7 kg (!) 138 kg (!) 138 kg    Examination:  General exam: Appears somnolent, but arousable, morbidly obese Respiratory system: Diminished to auscultation on the right side with no ability to auscultate on the left side.  Currently on BiPAP with FiO2 100% Cardiovascular system: S1 & S2 heard, RRR.  Gastrointestinal system: Abdomen is soft, obese Central nervous system: Alert and awake Extremities: Bilateral 1-2+ edema Skin: No significant lesions noted Psychiatry: Difficult to assess    Data Reviewed: I have personally reviewed following labs and imaging studies  CBC: Recent Labs  Lab  04/27/20 1109  WBC 6.5  HGB 17.0  HCT 58.8*  MCV 90.2  PLT 416   Basic Metabolic Panel: Recent Labs  Lab 04/27/20 1109 04/28/20 0325  NA 139 141  K 4.3 5.9*  CL 95* 98  CO2 32 26  GLUCOSE 277* 160*  BUN 33* 31*  CREATININE 1.05 1.01  CALCIUM 8.5* 8.3*   GFR: Estimated Creatinine Clearance: 103.3 mL/min (by C-G formula based on SCr of 1.01 mg/dL). Liver Function Tests: Recent Labs  Lab 04/27/20 1647  AST 23  ALT 49*  ALKPHOS 141*  BILITOT 0.8  PROT 6.7  ALBUMIN 3.3*   No results for input(s): LIPASE, AMYLASE in the last 168 hours. No results for input(s): AMMONIA in the last 168 hours. Coagulation Profile: No results for input(s): INR, PROTIME in the last 168 hours. Cardiac Enzymes: No results for input(s): CKTOTAL, CKMB, CKMBINDEX, TROPONINI in the last 168 hours. BNP (last 3 results) No results for input(s): PROBNP in the last 8760 hours. HbA1C: Recent Labs    04/27/20 1943  HGBA1C 8.8*   CBG:  Recent Labs  Lab 04/27/20 1101 04/27/20 2318 04/28/20 0032 04/28/20 0738  GLUCAP 297* 188* 274* 135*   Lipid Profile: No results for input(s): CHOL, HDL, LDLCALC, TRIG, CHOLHDL, LDLDIRECT in the last 72 hours. Thyroid Function Tests: No results for input(s): TSH, T4TOTAL, FREET4, T3FREE, THYROIDAB in the last 72 hours. Anemia Panel: No results for input(s): VITAMINB12, FOLATE, FERRITIN, TIBC, IRON, RETICCTPCT in the last 72 hours. Sepsis Labs: Recent Labs  Lab 04/27/20 1647  LATICACIDVEN 0.8    Recent Results (from the past 240 hour(s))  Blood culture (routine x 2)     Status: None (Preliminary result)   Collection Time: 04/27/20  4:47 PM   Specimen: Left Antecubital; Blood  Result Value Ref Range Status   Specimen Description LEFT ANTECUBITAL  Final   Special Requests   Final    BOTTLES DRAWN AEROBIC AND ANAEROBIC Blood Culture adequate volume   Culture   Final    NO GROWTH < 24 HOURS Performed at [facility], [address], [place],  [place] [address]    Report Status PENDING  Incomplete  Resp Panel by RT-PCR (Flu A&B, Covid) Nasopharyngeal Swab     Status: None   Collection Time: 04/27/20  5:20 PM   Specimen: Nasopharyngeal Swab; Nasopharyngeal(NP) swabs in vial transport medium  Result Value Ref Range Status   SARS Coronavirus 2 by RT PCR NEGATIVE NEGATIVE Final    Comment: (NOTE) SARS-CoV-2 target nucleic acids are NOT DETECTED.  The SARS-CoV-2 RNA is generally detectable in upper respiratory specimens during the acute phase of infection. The lowest concentration of SARS-CoV-2 viral copies this assay can detect is 138 copies/mL. A negative result does not preclude SARS-Cov-2 infection and should not be used as the sole basis for treatment or other patient management decisions. A negative result may occur with  improper specimen collection/handling, submission of specimen other than nasopharyngeal swab, presence of viral mutation(s) within the areas targeted by this assay, and inadequate number of viral copies(<138 copies/mL). A negative result must be combined with clinical observations, patient history, and epidemiological information. The expected result is Negative.  Fact Sheet for Patients:  EntrepreneurPulse.com.au  Fact Sheet for Healthcare Providers:  IncredibleEmployment.be  This test is no t yet approved or cleared by the [place] FDA and  has been authorized for detection and/or diagnosis of SARS-CoV-2 by FDA under an Emergency Use Authorization (EUA). This EUA will remain  in effect (meaning this test can be used) for the duration of the COVID-19 declaration under Section 564(b)(1) of the Act, 21 U.S.C.section 360bbb-3(b)(1), unless the authorization is terminated  or revoked sooner.       Influenza A by PCR NEGATIVE NEGATIVE Final   Influenza B by PCR NEGATIVE NEGATIVE Final    Comment: (NOTE) The Xpert Xpress SARS-CoV-2/FLU/RSV plus assay is intended as an  aid in the diagnosis of influenza from Nasopharyngeal swab specimens and should not be used as a sole basis for treatment. Nasal washings and aspirates are unacceptable for Xpert Xpress SARS-CoV-2/FLU/RSV testing.  Fact Sheet for Patients: EntrepreneurPulse.com.au  Fact Sheet for Healthcare Providers: IncredibleEmployment.be  This test is not yet approved or cleared by the [place] FDA and has been authorized for detection and/or diagnosis of SARS-CoV-2 by FDA under an Emergency Use Authorization (EUA). This EUA will remain in effect (meaning this test can be used) for the duration of the COVID-19 declaration under Section 564(b)(1) of the Act, 21 U.S.C. section 360bbb-3(b)(1), unless the authorization is terminated or revoked.  Performed  at [facility], [address], [place], [place] [address]   Blood culture (routine x 2)     Status: None (Preliminary result)   Collection Time: 04/27/20  7:43 PM   Specimen: BLOOD RIGHT ARM  Result Value Ref Range Status   Specimen Description BLOOD RIGHT ARM  Final   Special Requests   Final    BOTTLES DRAWN AEROBIC AND ANAEROBIC Blood Culture adequate volume   Culture   Final    NO GROWTH < 24 HOURS Performed at [facility], [address], [place], [place] [address]    Report Status PENDING  Incomplete         Radiology Studies: DG Chest 2 View  Result Date: 04/27/2020 CLINICAL DATA:  Short of breath EXAM: CHEST - 2 VIEW COMPARISON:  04/26/2020 FINDINGS: Right lower lobe airspace disease and small infiltrate unchanged. Left lung remains clear.  Negative for heart failure or edema. IMPRESSION: No interval change. Persistent right lower lobe infiltrate and effusion consistent with pneumonia. Electronically Signed   By: Franchot Gallo M.D.   On: 04/27/2020 11:36   DG CHEST PORT 1 VIEW  Result Date: 04/28/2020 CLINICAL DATA:  Respiratory failure EXAM: PORTABLE CHEST 1 VIEW COMPARISON:   04/27/2020 FINDINGS: New large left pleural effusion and diffuse airspace disease throughout the left lung. Moderate right pleural effusion, slightly increased since prior study with increasing right lung airspace disease. IMPRESSION: Enlarging bilateral effusions and worsening bilateral airspace disease, left worse than right. Electronically Signed   By: Rolm Baptise M.D.   On: 04/28/2020 01:13        Scheduled Meds: . aspirin EC  81 mg Oral Daily  . atorvastatin  80 mg Oral q1800  . canagliflozin  300 mg Oral QAC breakfast  . Chlorhexidine Gluconate Cloth  6 each Topical Daily  . clopidogrel  75 mg Oral Q breakfast  . dextrose  25 g Intravenous Once  . enoxaparin (LOVENOX) injection  40 mg Subcutaneous QHS  . furosemide  40 mg Intravenous BID  . insulin aspart  0-15 Units Subcutaneous TID WC  . insulin aspart  0-5 Units Subcutaneous QHS  . insulin aspart  10 Units Intravenous Once  . pantoprazole  40 mg Oral Daily  . sodium polystyrene  30 g Rectal Once   Continuous Infusions: . azithromycin 250 mL/hr at 04/27/20 2123  . cefTRIAXone (ROCEPHIN)  IV       LOS: 1 day    Time spent: 35 minutes    Monique Gift Darleen Crocker, DO [facility]  If 7PM-7AM, please contact night-coverage [email or web address] 04/28/2020, 9:37 AM

## 2020-04-28 NOTE — Progress Notes (Signed)
[facility] Progress Note Patient Name: Thomas [name] DOB: [birth year] MRN: [id number]   Date of Service  04/28/2020  HPI/Events of Note  Hyperkalemia - K+ = 5.9. Last ABG = 7.162/106/32.1 Sat = 93% by oximetry. Repaat ABG post BiPAP setting adjustment pending.   eICU Interventions  Plan: 1. Kayexalate 30 gm PR now.  2. Repeat BMP at 12 noon.      Intervention Category Major Interventions: Electrolyte abnormality - evaluation and management  [name] 04/28/2020, 6:25 AM

## 2020-04-28 NOTE — Progress Notes (Signed)
Pt came to floor about 0110 in bilateral wrist restraints but once in ICU and transferred to bed restraints were never reapplied as patient was placed on BIPAP

## 2020-04-28 NOTE — Consult Note (Signed)
NAME:  Thomas [name], MRN:  [id number], DOB:  Feb 25, 1956, LOS: 1 ADMISSION DATE:  04/27/2020, CONSULTATION DATE:  1/10 REFERRING MD:  Shah/ Triad  CHIEF COMPLAINT:  AMS/ C02 narci   Brief History:  31 yowm smoker from [place] with MO complicated by CHF/IHD and DM dx with progressive sob x 2 months and ? pna dx 1/9 [facility] in [place] but left AMA and admitted to [facility] 1/10 with severe hypoxemia/ hypercarbia improved on bipap and PCCM consulted am 1/11  History of Present Illness:  65 y.o. MO male with medical history significant for systolic CHF/ischemic cardiomyopathy, COPD, diabetes mellitus. Patient presented to the ED with complaints of increasing difficulty breathing over the past 2 months, productive cough.  He was evaluated by his primary care doctor on  1/7, prescribed a course of antibiotics for his pneumonia, azithromycin and cefuroxime and breathing treatments.  Patient started medications 1/8 and 1/10 was his 3rd dose.  Reports his symptoms are not worse.  Patient also reports bilateral lower extremity swelling over the past several weeks to months.  He reports compliance with Lasix 40 mg in the morning and 20 mg at night. Patient also reports recent weight gain, reports show  weight within the past week- 282 lb, weight  1/0 300Lbs.   Vaccinated for COVID x 3.  Details from care everywhere- Patient presented to [facility] 1/9 he had a CT scan of his chest with contrast which showed the infiltrate in the anterior right lower lobe along the fissure previously is more bandlike today and could represent chronic  atelectasis, scarring, or less likely recurrence of infiltrate.  There is no identified central mass to explain this bandlike opacity. Recommend attention on follow-up. The right middle lobe infiltrate on the previous study has nearly  resolved with only mild atelectasis remaining right anterior lower lobe pneumonia, with moderate right pleural effusion and associated atelectasis.     Admission was offered for CHF decompensation and pneumonia, but patient left AMA.  ED Course: O2 sats 77 %, on room air.  On 4 L.  Sats 89 to 92%.  Temperature 97.9.  WBC 6.5.  Portable chest x-ray shows right lower lobe infiltrate and effusion consistent with pneumonia. Ceftriaxone and azithromycin started in the ED.  Hospitalist to admit for further evaluation and management.  Past Medical History:  CHF (congestive heart failure)  Coronary artery disease, Morbid obesity ([place]), NSTEMI (non-ST elevated myocardial infarction) ([place]), COPD (chronic obstructive pulmonary disease)  Pneumonia Snoring Type 2 diabetes mellitus ([place]).   Significant Hospital Events:    Consults:  PCCM   Procedures:     Significant Diagnostic Tests:  U/S bilateral chest 1/11 > not enough to tap Echo 1/11 1. Left ventricular ejection fraction, by estimation, is 55 to 60%. The  left ventricle has normal function. The left ventricle has no regional  wall motion abnormalities. Left ventricular diastolic parameters were  normal.  2. Right ventricular systolic function is normal. The right ventricular  size is normal.  3. The mitral valve is normal in structure. No evidence of mitral valve  regurgitation. No evidence of mitral stenosis.  4. The aortic valve was not well visualized.  5. The inferior vena cava is normal in size with greater than 50%  respiratory variability, suggesting right atrial pressure of 3 mmHg.   Micro Data:  Resp viral Panel PCR  1/10>> neg covid/ flu BC x 2  1/10 >>  Antimicrobials:  Zmax  1/10 Rocephin 1/10  Scheduled Meds: . aspirin EC  81 mg Oral Daily  . atorvastatin  80 mg Oral q1800  . canagliflozin  300 mg Oral QAC breakfast  . Chlorhexidine Gluconate Cloth  6 each Topical Daily  . clopidogrel  75 mg Oral Q breakfast  . enoxaparin (LOVENOX) injection  40 mg Subcutaneous QHS  . furosemide  40 mg Intravenous BID  . insulin aspart  0-15 Units Subcutaneous  TID WC  . insulin aspart  0-5 Units Subcutaneous QHS  . pantoprazole  40 mg Oral Daily   Continuous Infusions: . azithromycin 250 mL/hr at 04/27/20 2123  . cefTRIAXone (ROCEPHIN)  IV     PRN Meds:.acetaminophen **OR** acetaminophen, guaiFENesin-dextromethorphan, ipratropium-albuterol, ondansetron **OR** ondansetron (ZOFRAN) IV, polyethylene glycol   Interim History / Subjective:  No complaints at rest/ admits he's extremely sedentary at home/ rides scooter to shop x "years"  Objective   Blood pressure (!) 110/54, pulse 92, temperature (!) 96.9 F (36.1 C), temperature source Axillary, resp. rate (!) 22, height 5\' 9"  (1.753 m), weight (!) 138 kg, SpO2 (!) 88 %.    Vent Mode: BIPAP FiO2 (%):  [100 %] 100 % Set Rate:  [20 bmp] 20 bmp PEEP:  [7 cmH20] 7 cmH20   Intake/Output Summary (Last 24 hours) at 04/28/2020 1615 Last data filed at 04/28/2020 1400 Gross per 24 hour  Intake 251.28 ml  Output 875 ml  Net -623.72 ml   Filed Weights   04/27/20 2139 04/28/20 0138 04/28/20 0500  Weight: (!) 137.7 kg (!) 138 kg (!) 138 kg    Examination: General: MO wm minimizes his symptoms Tmax 98.6 with sats upper 80s on HFNC Lungs: distant bs, minimal rhonchi Cardiovascular: RRR  NSR distant S1s2 Abdomen: massively obese/ poor excursion Extremities: trace to 1 + pitting both LEs Neuro: intact sensorium, no motor def   I personally reviewed images and agree with radiology impression as follows:  CXR:   Portable 1/11 Enlarging bilateral effusions and worsening bilateral airspace disease, left worse than right. My review:  Decreased aeration both bases, no def effusion     Resolved Hospital Problem list      Assessment & Plan:  1) MO complicated by OHS/ hypoxemic and hypercarbic resp failure aute on chronic  and severe restrictive > obst physiology to point where likely will need trelegy as apparently failed bibap previously    2) Abn cxr c/w atx though hard to exclude chf/ pna  - no  def pleural effusions. >>> check urine ag's  >>> IS/ up in chair as much as possible  >>> ABX for CAP see flowsheet   3) polycythemia, mild, due to  #1 (not primary likely)  >>>  lovenox a good idea here, should correct with adequate sats provided   4) carries dx of chf but bnp / echo not impressive so ? Why on entresto ?   Best practice (evaluated daily)  Diet: per triad Pain/Anxiety/Delirium protocol (if indicated): per triad/ would minimize VAP protocol (if indicated): DVT prophylaxis: per triad  GI prophylaxis: per triad Glucose control: per triad Mobility: up as tol Disposition:icu  Goals of Care:  Last date of multidisciplinary goals of care discussion:per triad Family and staff present:  Summary of discussion:  Follow up goals of care discussion due:  Code Status: full code   Labs   CBC: Recent Labs  Lab 04/27/20 1109  WBC 6.5  HGB 17.0  HCT 58.8*  MCV 90.2  PLT 295    Basic Metabolic Panel: Recent  Labs  Lab 04/27/20 1109 04/28/20 0325 04/28/20 1144  NA 139 141 144  K 4.3 5.9* 4.1  CL 95* 98 98  CO2 32 26 35*  GLUCOSE 277* 160* 111*  BUN 33* 31* 26*  CREATININE 1.05 1.01 0.72  CALCIUM 8.5* 8.3* 8.4*   GFR: Estimated Creatinine Clearance: 130.5 mL/min (by C-G formula based on SCr of 0.72 mg/dL). Recent Labs  Lab 04/27/20 1109 04/27/20 1647  WBC 6.5  --   LATICACIDVEN  --  0.8    Liver Function Tests: Recent Labs  Lab 04/27/20 1647  AST 23  ALT 49*  ALKPHOS 141*  BILITOT 0.8  PROT 6.7  ALBUMIN 3.3*   No results for input(s): LIPASE, AMYLASE in the last 168 hours. No results for input(s): AMMONIA in the last 168 hours.  ABG    Component Value Date/Time   PHART 7.246 (L) 04/28/2020 0615   PCO2ART 90.9 (HH) 04/28/2020 0615   PO2ART 81.9 (L) 04/28/2020 0615   HCO3 31.1 (H) 04/28/2020 1255   O2SAT 80.2 04/28/2020 1255     Coagulation Profile: No results for input(s): INR, PROTIME in the last 168 hours.  Cardiac Enzymes: No  results for input(s): CKTOTAL, CKMB, CKMBINDEX, TROPONINI in the last 168 hours.  HbA1C: Hgb A1c MFr Bld  Date/Time Value Ref Range Status  04/27/2020 07:43 PM 8.8 (H) 4.8 - 5.6 % Final    Comment:    (NOTE) Pre diabetes:          5.7%-6.4%  Diabetes:              >6.4%  Glycemic control for   <7.0% adults with diabetes   12/14/2018 05:36 PM 8.2 (H) 4.8 - 5.6 % Final    Comment:    (NOTE) Pre diabetes:          5.7%-6.4% Diabetes:              >6.4% Glycemic control for   <7.0% adults with diabetes     CBG: Recent Labs  Lab 04/27/20 2318 04/28/20 0032 04/28/20 0738 04/28/20 1140 04/28/20 1606  GLUCAP 188* 274* 135* 130* 135*      Past Medical History:  He,  has a past medical history of CHF (congestive heart failure) (HCC), COPD (chronic obstructive pulmonary disease) ([place]), Coronary artery disease, Morbid obesity ([place]), NSTEMI (non-ST elevated myocardial infarction) ([place]), Pneumonia, Secondary cardiomyopathy ([place]), Snoring, and Type 2 diabetes mellitus ([place]).   Surgical History:   Past Surgical History:  Procedure Laterality Date  . LEFT HEART CATH AND CORONARY ANGIOGRAPHY N/A 03/30/2018   Procedure: LEFT HEART CATH AND CORONARY ANGIOGRAPHY;  Surgeon: Troy Sine, MD;  Location: [facility];  Service: Cardiovascular;  Laterality: N/A;     Social History:   reports that he has been smoking cigarettes. He started smoking about 55 years ago. He has been smoking about 0.25 packs per day. He has never used smokeless tobacco. He reports previous alcohol use. He reports current drug use. Drug: Marijuana.   Family History:  His family history includes Emphysema in his father; Heart failure in his father.   Allergies Allergies  Allergen Reactions  . Glipizide Palpitations  . Codeine Nausea And Vomiting  . Doxycycline      Home Medications  Prior to Admission medications   Medication Sig Start Date End Date Taking? Authorizing Provider  aspirin EC 81  MG tablet Take 81 mg by mouth daily.   Yes [provider]  atorvastatin (LIPITOR) 80  MG tablet TAKE 1 TABLET (80 MG TOTAL) BY MOUTH DAILY AT 6 PM. Patient taking differently: Take 80 mg by mouth every evening. 03/27/20  Yes Satira Sark, MD  azithromycin (ZITHROMAX) 500 MG tablet Take 500 mg by mouth daily.   Yes [provider]  canagliflozin (INVOKANA) 300 MG TABS tablet Take 300 mg by mouth daily before breakfast.   Yes [provider]  carvedilol (COREG) 12.5 MG tablet TAKE 1 TABLET (12.5 MG TOTAL) BY MOUTH 2 (TWO) TIMES DAILY. 11/13/19 02/20/20 Yes Satira Sark, MD  cefUROXime (CEFTIN) 250 MG tablet Take 250 mg by mouth 2 (two) times daily with a meal.   Yes [provider]  clopidogrel (PLAVIX) 75 MG tablet TAKE 1 TABLET (75 MG TOTAL) BY MOUTH DAILY WITH BREAKFAST. 09/19/19  Yes Satira Sark, MD  furosemide (LASIX) 20 MG tablet Take 1 tablet (20 mg total) by mouth 2 (two) times daily. 03/02/20  Yes Satira Sark, MD  ibuprofen (ADVIL) 200 MG tablet Take 200 mg by mouth every 6 (six) hours as needed for fever or mild pain.   Yes [provider]  isosorbide mononitrate (IMDUR) 60 MG 24 hr tablet Take 1 tablet (60 mg total) by mouth daily. 09/11/19 02/20/20 Yes Verta Ellen., NP  metFORMIN (GLUCOPHAGE-XR) 500 MG 24 hr tablet Take 1,000 mg by mouth 2 (two) times daily. 11/25/18  Yes [provider]  nitroGLYCERIN (NITROSTAT) 0.4 MG SL tablet Place 1 tablet (0.4 mg total) under the tongue every 5 (five) minutes x 3 doses as needed for chest pain (if no relief after 2nd dose, proceed to the ED for an evaluation or call 911). 02/20/20  Yes Satira Sark, MD  omeprazole (PRILOSEC) 40 MG capsule Take 40 mg by mouth daily.   Yes [provider]  potassium chloride (KLOR-CON) 10 MEQ tablet TAKE 1 TABLET BY MOUTH EVERY DAY Patient taking differently: Take 10 mEq by mouth daily. 12/16/19  Yes Satira Sark, MD   sacubitril-valsartan (ENTRESTO) 24-26 MG Take 1 tablet by mouth 2 (two) times daily. 03/10/20  Yes Satira Sark, MD       The patient is critically ill with multiple organ systems failure and requires high complexity decision making for assessment and support, frequent evaluation and titration of therapies, application of advanced monitoring technologies and extensive interpretation of multiple databases. Critical Care Time devoted to patient care services described in this note is 45 minutes.    Christinia Gully, MD Pulmonary and [place] [phone]   After 7:00 pm call Elink  [phone]

## 2020-04-29 DIAGNOSIS — J9602 Acute respiratory failure with hypercapnia: Secondary | ICD-10-CM | POA: Diagnosis not present

## 2020-04-29 DIAGNOSIS — J9601 Acute respiratory failure with hypoxia: Secondary | ICD-10-CM | POA: Diagnosis not present

## 2020-04-29 DIAGNOSIS — J189 Pneumonia, unspecified organism: Secondary | ICD-10-CM | POA: Diagnosis not present

## 2020-04-29 LAB — CBC
HCT: 61 % — ABNORMAL HIGH (ref 39.0–52.0)
Hemoglobin: 17 g/dL (ref 13.0–17.0)
MCH: 25.8 pg — ABNORMAL LOW (ref 26.0–34.0)
MCHC: 27.9 g/dL — ABNORMAL LOW (ref 30.0–36.0)
MCV: 92.6 fL (ref 80.0–100.0)
Platelets: 206 10*3/uL (ref 150–400)
RBC: 6.59 MIL/uL — ABNORMAL HIGH (ref 4.22–5.81)
RDW: 18.1 % — ABNORMAL HIGH (ref 11.5–15.5)
WBC: 8 10*3/uL (ref 4.0–10.5)
nRBC: 0 % (ref 0.0–0.2)

## 2020-04-29 LAB — BASIC METABOLIC PANEL
Anion gap: 13 (ref 5–15)
BUN: 23 mg/dL (ref 8–23)
CO2: 37 mmol/L — ABNORMAL HIGH (ref 22–32)
Calcium: 8.4 mg/dL — ABNORMAL LOW (ref 8.9–10.3)
Chloride: 93 mmol/L — ABNORMAL LOW (ref 98–111)
Creatinine, Ser: 0.76 mg/dL (ref 0.61–1.24)
GFR, Estimated: >60 mL/min (ref >60)
Glucose, Bld: 111 mg/dL — ABNORMAL HIGH (ref 70–99)
Potassium: 3.7 mmol/L (ref 3.5–5.1)
Sodium: 143 mmol/L (ref 135–145)

## 2020-04-29 LAB — TSH: TSH: 0.481 u[IU]/mL (ref 0.350–4.500)

## 2020-04-29 LAB — STREP PNEUMONIAE URINARY ANTIGEN: Strep Pneumo Urinary Antigen: NEGATIVE

## 2020-04-29 LAB — GLUCOSE, CAPILLARY
Glucose-Capillary: 107 mg/dL — ABNORMAL HIGH (ref 70–99)
Glucose-Capillary: 180 mg/dL — ABNORMAL HIGH (ref 70–99)
Glucose-Capillary: 109 mg/dL — ABNORMAL HIGH (ref 70–99)
Glucose-Capillary: 137 mg/dL — ABNORMAL HIGH (ref 70–99)

## 2020-04-29 LAB — SEDIMENTATION RATE: Sed Rate: 12 mm/hr (ref 0–16)

## 2020-04-29 LAB — MAGNESIUM: Magnesium: 2.3 mg/dL (ref 1.7–2.4)

## 2020-04-29 MED ORDER — METHYLPREDNISOLONE SODIUM SUCC 125 MG IJ SOLR
80.0000 mg | Freq: Two times a day (BID) | INTRAMUSCULAR | Status: DC
  Administered 2020-04-29 – 2020-05-01 (×5): 80 mg via INTRAVENOUS
  Filled 2020-04-29 (×5): qty 2

## 2020-04-29 MED ORDER — IPRATROPIUM-ALBUTEROL 0.5-2.5 (3) MG/3ML IN SOLN
3.0000 mL | Freq: Three times a day (TID) | RESPIRATORY_TRACT | Status: DC
  Administered 2020-04-29: 3 mL via RESPIRATORY_TRACT
  Filled 2020-04-29: qty 3

## 2020-04-29 MED ORDER — IPRATROPIUM-ALBUTEROL 0.5-2.5 (3) MG/3ML IN SOLN
3.0000 mL | Freq: Four times a day (QID) | RESPIRATORY_TRACT | Status: DC
  Administered 2020-04-29 (×2): 3 mL via RESPIRATORY_TRACT
  Filled 2020-04-29 (×2): qty 3

## 2020-04-29 MED ORDER — FUROSEMIDE 10 MG/ML IJ SOLN
40.0000 mg | Freq: Once | INTRAMUSCULAR | Status: AC
  Administered 2020-04-29: 40 mg via INTRAVENOUS
  Filled 2020-04-29: qty 4

## 2020-04-29 MED ORDER — ENOXAPARIN SODIUM 80 MG/0.8ML ~~LOC~~ SOLN
65.0000 mg | Freq: Every day | SUBCUTANEOUS | Status: DC
  Administered 2020-04-29 – 2020-05-04 (×5): 65 mg via SUBCUTANEOUS
  Filled 2020-04-29 (×5): qty 0.8

## 2020-04-29 MED ORDER — ALBUTEROL SULFATE (2.5 MG/3ML) 0.083% IN NEBU
2.5000 mg | INHALATION_SOLUTION | Freq: Four times a day (QID) | RESPIRATORY_TRACT | Status: DC
  Administered 2020-04-29 – 2020-04-30 (×4): 2.5 mg via RESPIRATORY_TRACT
  Filled 2020-04-29 (×4): qty 3

## 2020-04-29 MED FILL — Medication: Qty: 1 | Status: AC

## 2020-04-29 NOTE — Progress Notes (Signed)
NAME:  Thomas [name], MRN:  [id number], DOB:  26-Aug-1955, LOS: 2 ADMISSION DATE:  04/27/2020, CONSULTATION DATE:  1/11 REFERRING MD:  Shah/ Triad  CHIEF COMPLAINT:  AMS/ C02 narci   Brief History:  18 yowm [place]oker from [place] with [place] complicated by CHF/IHD and DM dx with progressive sob x 2 months and ? pna dx 1/9 [facility] in [place] but left AMA and admitted to [facility] 1/10 with severe hypoxemia/ hypercarbia improved on bipap and PCCM consulted am 1/11  History of Present Illness:  65 y.o. MO male with medical history significant for systolic CHF/ischemic cardiomyopathy, COPD, diabetes mellitus. Patient presented to the ED with complaints of increasing difficulty breathing over the past 2 months, productive cough.  He was evaluated by his primary care doctor on  1/7, prescribed a course of antibiotics for his pneumonia, azithromycin and cefuroxime and breathing treatments.  Patient started medications 1/8 and 1/10 was his 3rd dose.  Reports his symptoms are not worse.  Patient also reports bilateral lower extremity swelling over the past several weeks to months.  He reports compliance with Lasix 40 mg in the morning and 20 mg at night. Patient also reports recent weight gain, reports show  weight within the past week- 282 lb, weight  1/0 300Lbs.   Vaccinated for COVID x 3.  Details from care everywhere- Patient presented to [facility] 1/9 he had a CT scan of his chest with contrast which showed the infiltrate in the anterior right lower lobe along the fissure previously is more bandlike today and could represent chronic  atelectasis, scarring, or less likely recurrence of infiltrate.  There is no identified central mass to explain this bandlike opacity. Recommend attention on follow-up. The right middle lobe infiltrate on the previous study has nearly  resolved with only mild atelectasis remaining right anterior lower lobe pneumonia, with moderate right pleural effusion and associated atelectasis.     Admission was offered for CHF decompensation and pneumonia, but patient left AMA.  ED Course: O2 sats 77 %, on room air.  On 4 L.  Sats 89 to 92%.  Temperature 97.9.  WBC 6.5.  Portable chest x-ray shows right lower lobe infiltrate and effusion consistent with pneumonia. Ceftriaxone and azithromycin started in the ED.  Hospitalist to admit for further evaluation and management.  Past Medical History:  CHF (congestive heart failure)  Coronary artery disease, Morbid obesity ([place]), NSTEMI (non-ST elevated myocardial infarction) ([place]), COPD (chronic obstructive pulmonary disease)  Pneumonia Snoring Type 2 diabetes mellitus ([place]).   Significant Hospital Events:  Added solumedrol 1/12 for airways dz   Consults:  PCCM   Procedures:     Significant Diagnostic Tests:  U/S bilateral chest 1/11 > not enough to tap Echo 1/11 1. Left ventricular ejection fraction, by estimation, is 55 to 60%. The  left ventricle has normal function. The left ventricle has no regional  wall motion abnormalities. Left ventricular diastolic parameters were  normal.  2. Right ventricular systolic function is normal. The right ventricular  size is normal.  3. The mitral valve is normal in structure. No evidence of mitral valve  regurgitation. No evidence of mitral stenosis.  4. The aortic valve was not well visualized.  5. The inferior vena cava is normal in size with greater than 50%  respiratory variability, suggesting right atrial pressure of 3 mmHg.     Micro Data:  Resp viral Panel PCR  1/10>> neg covid/ flu BC x 2  1/10 >> Urine  Strep  1/11 > neg  Urine Legionella 1/11 >>>  Antimicrobials:  Zmax  1/10 Rocephin 1/10   Scheduled Meds: . aspirin EC  81 mg Oral Daily  . atorvastatin  80 mg Oral q1800  . canagliflozin  300 mg Oral QAC breakfast  . Chlorhexidine Gluconate Cloth  6 each Topical Daily  . clopidogrel  75 mg Oral Q breakfast  . enoxaparin (LOVENOX) injection  65 mg  Subcutaneous QHS  . furosemide  40 mg Intravenous BID  . insulin aspart  0-15 Units Subcutaneous TID WC  . insulin aspart  0-5 Units Subcutaneous QHS  . ipratropium-albuterol  3 mL Nebulization TID  . pantoprazole  40 mg Oral Daily   Continuous Infusions: . azithromycin 500 mg (04/28/20 1837)  . cefTRIAXone (ROCEPHIN)  IV 2 g (04/28/20 1744)   PRN Meds:.acetaminophen **OR** acetaminophen, guaiFENesin-dextromethorphan, ipratropium-albuterol, ondansetron **OR** ondansetron (ZOFRAN) IV, polyethylene glycol   Interim History / Subjective:  Becoming more bipap dependent/ have not been able to mobilize to recliner as rec   Objective   Blood pressure (!) 131/54, pulse (!) 108, temperature 98.9 F (37.2 C), temperature source Axillary, resp. rate (!) 24, height '5\' 9"'  (1.753 m), weight 134.7 kg, SpO2 (!) 89 %.    Vent Mode: BIPAP FiO2 (%):  [100 %] 100 % Set Rate:  [20 bmp] 20 bmp PEEP:  [5 cmH20-7 cmH20] 5 cmH20   Intake/Output Summary (Last 24 hours) at 04/29/2020 1333 Last data filed at 04/29/2020 0800 Gross per 24 hour  Intake 2605.67 ml  Output 1825 ml  Net 780.67 ml   Filed Weights   04/28/20 0138 04/28/20 0500 04/29/20 0500  Weight: (!) 138 kg (!) 138 kg 134.7 kg    Examination: General:   Tmax 99 with sats upper 80s on BIPAP ST with rate 20  Pt sleepy, wants coffee on stimulation No jvd Oropharynx on bipap Neck supple Lungs  exp > insp rhonchi bilaterally RRR no s3 or or sign murmur Abd obese with poor air movement  excursion  Extr warm with trace edema  Neuro   no apparent motor deficits         Resolved Hospital Problem list      Assessment & Plan:  1) MO complicated by OHS/ hypoxemic and hypercarbic resp failure aute on chronic  and severe restrictive > obst physiology to point where likely will need trilegy as apparently failed bibap previously  >>> check tsh   2) Abn cxr c/w atx though hard to exclude chf/ pna  - no def pleural effusions. >>> IS/ up in  chair as much as possible  >>> ABX for CAP see flowsheet  >>> check esr ,  added steroids 1/12 for airways dz  And ? Component FP ALI   3) polycythemia, mild, due to  #1 (not primary likely) worse with diuresis   >>>  lovenox a good idea here, should correct with adequate sats provided   4) carries dx of  chf but bnp / echo not impressive so ? Why on entresto ? - leave off entresto  / would not diures further   Best practice (evaluated daily)  Diet: per triad Pain/Anxiety/Delirium protocol (if indicated): per triad/ would minimize VAP protocol (if indicated): DVT prophylaxis: per triad  GI prophylaxis: per triad Glucose control: per triad Mobility: up as tol Disposition:icu  Goals of Care:  Last date of multidisciplinary goals of care discussion:per triad Family and staff present:  Summary of discussion:  Follow up goals of  care discussion due:  Code Status: full code   Labs   CBC: Recent Labs  Lab 04/27/20 1109 04/29/20 0500  WBC 6.5 8.0  HGB 17.0 17.0  HCT 58.8* 61.0*  MCV 90.2 92.6  PLT 211 175    Basic Metabolic Panel: Recent Labs  Lab 04/27/20 1109 04/28/20 0325 04/28/20 1144 04/29/20 0500  NA 139 141 144 143  K 4.3 5.9* 4.1 3.7  CL 95* 98 98 93*  CO2 32 26 35* 37*  GLUCOSE 277* 160* 111* 111*  BUN 33* 31* 26* 23  CREATININE 1.05 1.01 0.72 0.76  CALCIUM 8.5* 8.3* 8.4* 8.4*  MG  --   --   --  2.3   GFR: Estimated Creatinine Clearance: 128.7 mL/min (by C-G formula based on SCr of 0.76 mg/dL). Recent Labs  Lab 04/27/20 1109 04/27/20 1647 04/29/20 0500  WBC 6.5  --  8.0  LATICACIDVEN  --  0.8  --     Liver Function Tests: Recent Labs  Lab 04/27/20 1647  AST 23  ALT 49*  ALKPHOS 141*  BILITOT 0.8  PROT 6.7  ALBUMIN 3.3*   No results for input(s): LIPASE, AMYLASE in the last 168 hours. No results for input(s): AMMONIA in the last 168 hours.  ABG    Component Value Date/Time   PHART 7.246 (L) 04/28/2020 0615   PCO2ART 90.9 (HH)  04/28/2020 0615   PO2ART 81.9 (L) 04/28/2020 0615   HCO3 31.1 (H) 04/28/2020 1255   O2SAT 80.2 04/28/2020 1255     Coagulation Profile: No results for input(s): INR, PROTIME in the last 168 hours.  Cardiac Enzymes: No results for input(s): CKTOTAL, CKMB, CKMBINDEX, TROPONINI in the last 168 hours.  HbA1C: Hgb A1c MFr Bld  Date/Time Value Ref Range Status  04/27/2020 07:43 PM 8.8 (H) 4.8 - 5.6 % Final    Comment:    (NOTE) Pre diabetes:          5.7%-6.4%  Diabetes:              >6.4%  Glycemic control for   <7.0% adults with diabetes   12/14/2018 05:36 PM 8.2 (H) 4.8 - 5.6 % Final    Comment:    (NOTE) Pre diabetes:          5.7%-6.4% Diabetes:              >6.4% Glycemic control for   <7.0% adults with diabetes     CBG: Recent Labs  Lab 04/28/20 1140 04/28/20 1606 04/28/20 2111 04/29/20 0748 04/29/20 1120  GLUCAP 130* 135* 155* 107* 180*        The patient is critically ill with multiple organ systems failure and requires high complexity decision making for assessment and support, frequent evaluation and titration of therapies, application of advanced monitoring technologies and extensive interpretation of multiple databases. Critical Care Time devoted to patient care services described in this note is 35 minutes.    Christinia Gully, MD Pulmonary and [place] [phone]   After 7:00 pm call Elink  [phone]

## 2020-04-29 NOTE — Progress Notes (Signed)
PROGRESS NOTE    Thomas [name]  OHY:[id number] DOB: Mar 24, 1956 DOA: 04/27/2020 PCP: Leonie Douglas, MD   Brief Narrative:  Thomas [name] a 65 y.o.malewith medical history significant forsystolic CHF/ischemic cardiomyopathy, COPD, diabetes mellitus. Patient presented to the ED with complaints of increasing difficulty breathing over the past 2 months, productive cough.  Patient was initially admitted with acute hypoxemic respiratory failure that appears multifactorial in the setting of pneumonia and COPD as well as acute on chronic heart failure.  Unfortunately, he has had worsening oxygen saturations and dyspnea noted overnight on 1/11 prompting transfer to ICU and placement on BiPAP.  He continues to remain on BiPAP this morning with ABG that is improving.  Appreciate pulmonology consultation.  Assessment & Plan:   Principal Problem:   PNA (pneumonia) Active Problems:   Morbid obesity ([place])   Ischemic cardiomyopathy   Tobacco abuse   DM (diabetes mellitus) ([place])   Acute respiratory failure with hypoxia and hypercapnia (HCC)   Acute on chronic systolic CHF (congestive heart failure) (HCC)   Acute respiratory failure with hypoxia (HCC)   Acute on chronic hypoxic and hypercapnic respiratory failure -Appears to be multifactorial in the setting of worsening CHF as well as COPD with pneumonia -Appears to have obesity hypoventilation syndrome and has failed BiPAP in the past.  Plan for trilogy outpatient -Noted bilateral pleural effusions on chest x-ray, that were not duplicated on ultrasound.  Thoracentesis avoided. -Continue on BiPAP at nighttime and high flow nasal cannula in a.m. -Appreciate ongoing pulmonology evaluation  Acute on chronic diastolic congestive heart failure -Echo in 10/21 with LVEF 50%, repeat limited echo with LVEF 55 to 60% on 1/11 -IV Lasix 40 mg twice daily for SBP greater than 100 -Strict input output, daily weights, limit fluid intake -Daily BMP  and magnesium  Hyperkalemia-resolved -Recheck in a.m.  Pneumonia-not septic. Appears to beresolving, chest x-ray today shows right lower lobe pneumonia, [facility]che[facility] middle lobe infiltrate on the previous study has nearly resolved with only mild atelectasis remaining.Started outpatient antibiotic course 1/8,with cefuroxime and azithromycin. -Plan to complete at least 5-day course of IV antibiotics, currently day 3 -Follow-upblood cultures ordered in ED, pending  COPD-veryfaint scattered expiratory wheezing on auscultation. -DuoNebs as needed and scheduled. - PRN Mucolytics  Ischemic cardiomyopathy-history ofNSTEMI 2019. Cardiac cath revealed diffuse disease in second marginal branch with 90% stenosis.Medical therapy was recommended with medications to include dual antiplatelets thoughstenting was not performed, Follows with Dr. Conni Elliot. -Resume home medications-aspirin, Plavix, carvedilol, Imdur, atorvastatin, Entresto -Currently cannot take these medications as he is on backup  Diabetes mellitus-hyperglycemia improved -Obtain hemoglobin A1c 8.8% -, Being held at this time -Hold home Metformin -SSI- M, continued for now given adequate blood glucose readings  Tobacco abuse-reports he has cut down his smoking to 2 cigarettes daily.  Morbid obesity BMI 44   DVT prophylaxis:Lovenox Code Status: Full Family Communication: Tried calling spouse with no response 1/12 Disposition Plan:  Status is: Inpatient  Remains inpatient appropriate because:Unsafe d/c plan, IV treatments appropriate due to intensity of illness or inability to take PO and Inpatient level of care appropriate due to severity of illness   Dispo: The patient is from: Home  Anticipated d/c is to: Home  Anticipated d/c date is: 2-3 days  Patient currently is not medically stable to d/c.  Patient reports some ongoing  diuresis as well as thoracentesis today.  Currently on BiPAP.   Consultants:   Pulmonology  Procedures:   See below  Antimicrobials:  Anti-infectives (From admission, onward)   Start     Dose/Rate Route Frequency Ordered Stop   04/28/20 1700  cefTRIAXone (ROCEPHIN) 2 g in sodium chloride 0.9 % 100 mL IVPB        2 g 200 mL/hr over 30 Minutes Intravenous Every 24 hours 04/27/20 2140     04/27/20 1730  cefTRIAXone (ROCEPHIN) 1 g in sodium chloride 0.9 % 100 mL IVPB        1 g 200 mL/hr over 30 Minutes Intravenous  Once 04/27/20 1719 04/27/20 2024   04/27/20 1730  azithromycin (ZITHROMAX) 500 mg in sodium chloride 0.9 % 250 mL IVPB        500 mg 250 mL/hr over 60 Minutes Intravenous Every 24 hours 04/27/20 1719         Subjective: Patient seen and evaluated today with no new acute complaints or concerns. No acute concerns or events noted overnight.  He has rested overnight on BiPAP.  Objective: Vitals:   04/29/20 0400 04/29/20 0500 04/29/20 0727 04/29/20 0800  BP:    (!) 131/54  Pulse:    (!) 108  Resp:    (!) 24  Temp: 98.2 F (36.8 C)  99 F (37.2 C)   TempSrc: Axillary  Axillary   SpO2:   (!) 85% (!) 86%  Weight:  134.7 kg    Height:        Intake/Output Summary (Last 24 hours) at 04/29/2020 1037 Last data filed at 04/29/2020 0800 Gross per 24 hour  Intake 2605.67 ml  Output 1825 ml  Net 780.67 ml   Filed Weights   04/28/20 0138 04/28/20 0500 04/29/20 0500  Weight: (!) 138 kg (!) 138 kg 134.7 kg    Examination:  General exam: Appears calm and comfortable, morbidly obese Respiratory system: Diminished bilaterally.  Continues on BiPAP 100%. Cardiovascular system: S1 & S2 heard, RRR.  Gastrointestinal system: Abdomen is soft Central nervous system: Alert and awake Extremities: Scant bilateral edema Skin: No significant lesions noted Psychiatry: Flat affect.    Data Reviewed: I have personally reviewed following labs and imaging  studies  CBC: Recent Labs  Lab 04/27/20 1109 04/29/20 0500  WBC 6.5 8.0  HGB 17.0 17.0  HCT 58.8* 61.0*  MCV 90.2 92.6  PLT 211 638   Basic Metabolic Panel: Recent Labs  Lab 04/27/20 1109 04/28/20 0325 04/28/20 1144 04/29/20 0500  NA 139 141 144 143  K 4.3 5.9* 4.1 3.7  CL 95* 98 98 93*  CO2 32 26 35* 37*  GLUCOSE 277* 160* 111* 111*  BUN 33* 31* 26* 23  CREATININE 1.05 1.01 0.72 0.76  CALCIUM 8.5* 8.3* 8.4* 8.4*  MG  --   --   --  2.3   GFR: Estimated Creatinine Clearance: 128.7 mL/min (by C-G formula based on SCr of 0.76 mg/dL). Liver Function Tests: Recent Labs  Lab 04/27/20 1647  AST 23  ALT 49*  ALKPHOS 141*  BILITOT 0.8  PROT 6.7  ALBUMIN 3.3*   No results for input(s): LIPASE, AMYLASE in the last 168 hours. No results for input(s): AMMONIA in the last 168 hours. Coagulation Profile: No results for input(s): INR, PROTIME in the last 168 hours. Cardiac Enzymes: No results for input(s): CKTOTAL, CKMB, CKMBINDEX, TROPONINI in the last 168 hours. BNP (last 3 results) No results for input(s): PROBNP in the last 8760 hours. HbA1C: Recent Labs    04/27/20 1943  HGBA1C 8.8*   CBG: Recent Labs  Lab 04/28/20 0738 04/28/20  1140 04/28/20 1606 04/28/20 2111 04/29/20 0748  GLUCAP 135* 130* 135* 155* 107*   Lipid Profile: No results for input(s): CHOL, HDL, LDLCALC, TRIG, CHOLHDL, LDLDIRECT in the last 72 hours. Thyroid Function Tests: No results for input(s): TSH, T4TOTAL, FREET4, T3FREE, THYROIDAB in the last 72 hours. Anemia Panel: No results for input(s): VITAMINB12, FOLATE, FERRITIN, TIBC, IRON, RETICCTPCT in the last 72 hours. Sepsis Labs: Recent Labs  Lab 04/27/20 1647  LATICACIDVEN 0.8    Recent Results (from the past 240 hour(s))  Blood culture (routine x 2)     Status: None (Preliminary result)   Collection Time: 04/27/20  4:47 PM   Specimen: Left Antecubital; Blood  Result Value Ref Range Status   Specimen Description LEFT  ANTECUBITAL  Final   Special Requests   Final    BOTTLES DRAWN AEROBIC AND ANAEROBIC Blood Culture adequate volume   Culture   Final    NO GROWTH 2 DAYS Performed at [facility], [address] [place], [place] [address]    Report Status PENDING  Incomplete  Resp Panel by RT-PCR (Flu A&B, Covid) Nasopharyngeal Swab     Status: None   Collection Time: 04/27/20  5:20 PM   Specimen: Nasopharyngeal Swab; Nasopharyngeal(NP) swabs in vial transport medium  Result Value Ref Range Status   SARS Coronavirus 2 by RT PCR NEGATIVE NEGATIVE Final    Comment: (NOTE) SARS-CoV-2 target nucleic acids are NOT DETECTED.  The SARS-CoV-2 RNA is generally detectable in upper respiratory specimens during the acute phase of infection. The lowest concentration of SARS-CoV-2 viral copies this assay can detect is 138 copies/mL. A negative result does not preclude SARS-Cov-2 infection and should not be used as the sole basis for treatment or other patient management decisions. A negative result may occur with  improper specimen collection/handling, submission of specimen other than nasopharyngeal swab, presence of viral mutation(s) within the areas targeted by this assay, and inadequate number of viral copies(<138 copies/mL). A negative result must be combined with clinical observations, patient history, and epidemiological information. The expected result is Negative.  Fact Sheet for Patients:  EntrepreneurPulse.com.au  Fact Sheet for Healthcare Providers:  IncredibleEmployment.be  This test is no t yet approved or cleared by the [place] FDA and  has been authorized for detection and/or diagnosis of SARS-CoV-2 by FDA under an Emergency Use Authorization (EUA). This EUA will remain  in effect (meaning this test can be used) for the duration of the COVID-19 declaration under Section 564(b)(1) of the Act, 21 U.S.C.section 360bbb-3(b)(1), unless the authorization is  terminated  or revoked sooner.       Influenza A by PCR NEGATIVE NEGATIVE Final   Influenza B by PCR NEGATIVE NEGATIVE Final    Comment: (NOTE) The Xpert Xpress SARS-CoV-2/FLU/RSV plus assay is intended as an aid in the diagnosis of influenza from Nasopharyngeal swab specimens and should not be used as a sole basis for treatment. Nasal washings and aspirates are unacceptable for Xpert Xpress SARS-CoV-2/FLU/RSV testing.  Fact Sheet for Patients: EntrepreneurPulse.com.au  Fact Sheet for Healthcare Providers: IncredibleEmployment.be  This test is not yet approved or cleared by the [place] FDA and has been authorized for detection and/or diagnosis of SARS-CoV-2 by FDA under an Emergency Use Authorization (EUA). This EUA will remain in effect (meaning this test can be used) for the duration of the COVID-19 declaration under Section 564(b)(1) of the Act, 21 U.S.C. section 360bbb-3(b)(1), unless the authorization is terminated or revoked.  Performed at [facility], [phone]  [address], [place], [place] [address]   Blood culture (routine x 2)     Status: None (Preliminary result)   Collection Time: 04/27/20  7:43 PM   Specimen: BLOOD RIGHT ARM  Result Value Ref Range Status   Specimen Description BLOOD RIGHT ARM  Final   Special Requests   Final    BOTTLES DRAWN AEROBIC AND ANAEROBIC Blood Culture adequate volume   Culture   Final    NO GROWTH 2 DAYS Performed at [facility], [address] [place], [place] [address]    Report Status PENDING  Incomplete         Radiology Studies: DG Chest 2 View  Result Date: 04/27/2020 CLINICAL DATA:  Short of breath EXAM: CHEST - 2 VIEW COMPARISON:  04/26/2020 FINDINGS: Right lower lobe airspace disease and small infiltrate unchanged. Left lung remains clear.  Negative for heart failure or edema. IMPRESSION: No interval change. Persistent right lower lobe infiltrate and effusion consistent with  pneumonia. Electronically Signed   By: Franchot Gallo M.D.   On: 04/27/2020 11:36   [place] CHEST (PLEURAL EFFUSION)  Result Date: 04/28/2020 CLINICAL DATA:  Pleural effusions, for thoracentesis EXAM: CHEST ULTRASOUND COMPARISON:  Chest radiograph 04/28/2020 FINDINGS: Ultrasound the chest demonstrates small BILATERAL pleural effusions. Volume of pleural effusions since identified sonographically is insufficient for expected therapeutic benefit from thoracentesis. Volume of fluid identified on the LEFT is less than expected based on chest radiograph. Procedure not performed. IMPRESSION: Small BILATERAL pleural effusions; thoracentesis not performed. Electronically Signed   By: Lavonia Dana M.D.   On: 04/28/2020 11:58   DG CHEST PORT 1 VIEW  Result Date: 04/28/2020 CLINICAL DATA:  Respiratory failure EXAM: PORTABLE CHEST 1 VIEW COMPARISON:  04/27/2020 FINDINGS: New large left pleural effusion and diffuse airspace disease throughout the left lung. Moderate right pleural effusion, slightly increased since prior study with increasing right lung airspace disease. IMPRESSION: Enlarging bilateral effusions and worsening bilateral airspace disease, left worse than right. Electronically Signed   By: Rolm Baptise M.D.   On: 04/28/2020 01:13   ECHOCARDIOGRAM LIMITED  Result Date: 04/28/2020    ECHOCARDIOGRAM LIMITED REPORT   Patient Name:   Thomas [name] Date of Exam: 04/28/2020 Medical Rec #:  [id number]      Height:       69.0 in Accession #:    [id number]     Weight:       304.2 lb Date of Birth:  10/26/1955      BSA:          2.469 m Patient Age:    41 years       BP:           91/30 mmHg Patient Gender: M              HR:           80 bpm. Exam Location:  [facility] Procedure: Limited Echo and Intracardiac Opacification Agent Indications:    CHF-Acute Diastolic P82.42  History:        Patient has prior history of Echocardiogram examinations, most                 recent 01/30/2020. Previous Myocardial Infarction; Risk                  Factors:Current Smoker and Diabetes. Ischemic cardiomyopathy.  Sonographer:    Leavy Cella RDCS (AE) Referring Phys: [phone] Royanne Foots [place]  1. Left ventricular ejection fraction, by estimation, is 55  to 60%. The left ventricle has normal function. The left ventricle has no regional wall motion abnormalities. Left ventricular diastolic parameters were normal.  2. Right ventricular systolic function is normal. The right ventricular size is normal.  3. The mitral valve is normal in structure. No evidence of mitral valve regurgitation. No evidence of mitral stenosis.  4. The aortic valve was not well visualized.  5. The inferior vena cava is normal in size with greater than 50% respiratory variability, suggesting right atrial pressure of 3 mmHg. FINDINGS  Left Ventricle: Left ventricular ejection fraction, by estimation, is 55 to 60%. The left ventricle has normal function. The left ventricle has no regional wall motion abnormalities. Definity contrast agent was given IV to delineate the left ventricular  endocardial borders. The left ventricular internal cavity size was normal in size. Left ventricular diastolic parameters were normal. Right Ventricle: The right ventricular size is normal. Right vetricular wall thickness was not assessed. Right ventricular systolic function is normal. Left Atrium: Left atrial size was normal in size. Right Atrium: Right atrial size was not well visualized. Pericardium: There is no evidence of pericardial effusion. Mitral Valve: The mitral valve is normal in structure. No evidence of mitral valve stenosis. Tricuspid Valve: The tricuspid valve is not well visualized. Aortic Valve: The aortic valve was not well visualized. Pulmonic Valve: The pulmonic valve was not well visualized. Pulmonic valve regurgitation is not visualized. No evidence of pulmonic stenosis. Aorta: The aortic root is normal in size and structure. Venous: The inferior vena cava is normal  in size with greater than 50% respiratory variability, suggesting right atrial pressure of 3 mmHg. IAS/Shunts: No atrial level shunt detected by color flow Doppler. LEFT VENTRICLE PLAX 2D LVIDd:         6.25 cm  Diastology LVIDs:         4.76 cm  LV e' medial:    8.38 cm/s LV PW:         0.90 cm  LV E/e' medial:  10.3 LV IVS:        0.95 cm  LV e' lateral:   8.81 cm/s LVOT diam:     2.10 cm  LV E/e' lateral: 9.8 LVOT Area:     3.46 cm  LEFT ATRIUM           Index LA diam:      3.60 cm 1.46 cm/m LA Vol (A4C): 30.0 ml 12.15 ml/m   AORTA Ao Root diam: 3.20 cm MITRAL VALVE MV Area (PHT): 2.61 cm    SHUNTS MV Decel Time: 291 msec    Systemic Diam: 2.10 cm MV E velocity: 85.90 cm/s MV A velocity: 80.40 cm/s MV E/A ratio:  1.07 Carlyle Dolly MD Electronically signed by Carlyle Dolly MD Signature Date/Time: 04/28/2020/11:16:07 AM    Final    [place] EKG SITE RITE  Result Date: 04/28/2020 If Site Rite image not attached, placement could not be confirmed due to current cardiac rhythm.       Scheduled Meds: . aspirin EC  81 mg Oral Daily  . atorvastatin  80 mg Oral q1800  . canagliflozin  300 mg Oral QAC breakfast  . Chlorhexidine Gluconate Cloth  6 each Topical Daily  . clopidogrel  75 mg Oral Q breakfast  . enoxaparin (LOVENOX) injection  65 mg Subcutaneous QHS  . furosemide  40 mg Intravenous BID  . insulin aspart  0-15 Units Subcutaneous TID WC  . insulin aspart  0-5 Units Subcutaneous QHS  . ipratropium-albuterol  3 mL Nebulization TID  . pantoprazole  40 mg Oral Daily   Continuous Infusions: . azithromycin 500 mg (04/28/20 1837)  . cefTRIAXone (ROCEPHIN)  IV 2 g (04/28/20 1744)     LOS: 2 days    Time spent: 35 minutes    Thomas Delcid D Manuella Ghazi, DO [facility]  If 7PM-7AM, please contact night-coverage [email or web address] 04/29/2020, 10:37 AM

## 2020-04-29 NOTE — Progress Notes (Signed)
Came down on pressure on Bipap, patient's sats came up and so did his map.  Patient is now 17/7.  Sats came up to 88%, but did drop again to 83%.  RN has given lasix, so hopefully the changes will improve sat and pressures.  Will continue to monitor.

## 2020-04-30 ENCOUNTER — Inpatient Hospital Stay (HOSPITAL_COMMUNITY): Payer: Medicare HMO

## 2020-04-30 DIAGNOSIS — J9621 Acute and chronic respiratory failure with hypoxia: Secondary | ICD-10-CM | POA: Diagnosis not present

## 2020-04-30 DIAGNOSIS — J9622 Acute and chronic respiratory failure with hypercapnia: Secondary | ICD-10-CM | POA: Diagnosis not present

## 2020-04-30 LAB — GLUCOSE, CAPILLARY
Glucose-Capillary: 188 mg/dL — ABNORMAL HIGH (ref 70–99)
Glucose-Capillary: 258 mg/dL — ABNORMAL HIGH (ref 70–99)
Glucose-Capillary: 205 mg/dL — ABNORMAL HIGH (ref 70–99)
Glucose-Capillary: 179 mg/dL — ABNORMAL HIGH (ref 70–99)

## 2020-04-30 LAB — LEGIONELLA PNEUMOPHILA SEROGP 1 UR AG: L. pneumophila Serogp 1 Ur Ag: NEGATIVE

## 2020-04-30 LAB — BASIC METABOLIC PANEL
Anion gap: 15 (ref 5–15)
BUN: 38 mg/dL — ABNORMAL HIGH (ref 8–23)
CO2: 38 mmol/L — ABNORMAL HIGH (ref 22–32)
Calcium: 9 mg/dL (ref 8.9–10.3)
Chloride: 94 mmol/L — ABNORMAL LOW (ref 98–111)
Creatinine, Ser: 0.94 mg/dL (ref 0.61–1.24)
GFR, Estimated: >60 mL/min (ref >60)
Glucose, Bld: 171 mg/dL — ABNORMAL HIGH (ref 70–99)
Potassium: 4.1 mmol/L (ref 3.5–5.1)
Sodium: 147 mmol/L — ABNORMAL HIGH (ref 135–145)

## 2020-04-30 LAB — MAGNESIUM: Magnesium: 2.4 mg/dL (ref 1.7–2.4)

## 2020-04-30 MED ORDER — ALBUTEROL SULFATE (2.5 MG/3ML) 0.083% IN NEBU: 2.5000 mg | INHALATION_SOLUTION | Freq: Four times a day (QID) | RESPIRATORY_TRACT | Status: DC | PRN

## 2020-04-30 MED ORDER — IPRATROPIUM-ALBUTEROL 0.5-2.5 (3) MG/3ML IN SOLN
3.0000 mL | Freq: Four times a day (QID) | RESPIRATORY_TRACT | Status: DC
  Administered 2020-04-30 – 2020-05-03 (×14): 3 mL via RESPIRATORY_TRACT
  Filled 2020-04-30 (×13): qty 3

## 2020-04-30 NOTE — Care Management (Signed)
Patient has chronic respiratory failure and severe COPD. Pt requires frequent durations of respiratory support and deteriorates quickly in the absence of non-invasive mechanical ventilator. BIPAP has been considered, but has been ruled-out as insufficient to effectively manage the patient's PCO2 levels. Pt's PCO2 was 90.9 on 04/29/2020. Interruption or failure to provide NIMV would quickly lead to exacerbation of the patient's condition, lead to hospitalization, and likely harm the patient. Continued use of the NIMV is preferred.

## 2020-04-30 NOTE — Progress Notes (Signed)
PROGRESS NOTE    Thomas [name]  YNW:[id number] DOB: [birth year] DOA: 04/27/2020 PCP: Leonie Douglas, MD   Brief Narrative:  Thomas [name] a 65 y.o.malewith medical history significant forsystolic CHF/ischemic cardiomyopathy, COPD, diabetes mellitus. Patient presented to the ED with complaints of increasing difficulty breathing over the past 2 months, productive cough.Patient was initially admitted with acute hypoxemic respiratory failure that appears multifactorial in the setting of pneumonia and COPD as well as acute on chronic heart failure. Unfortunately, he has had worsening oxygen saturations and dyspnea noted overnight on 1/11 prompting transfer to ICU and placement on BiPAP.  He continues to remain on BiPAP this morning with ABG that is improving.  Appreciate pulmonology consultation.  Assessment & Plan:   Principal Problem:   PNA (pneumonia) Active Problems:   Morbid obesity ([place])   Ischemic cardiomyopathy   Tobacco abuse   DM (diabetes mellitus) ([place])   Acute respiratory failure with hypoxia and hypercapnia (HCC)   Acute on chronic systolic CHF (congestive heart failure) (HCC)   Acute respiratory failure with hypoxia (HCC)   Acute on chronic hypoxicand hypercapnicrespiratory failure-ongoing -Appears to be multifactorial in the setting of worsening CHF as well as COPD with pneumonia -Appears to have obesity hypoventilation syndrome and has failed BiPAP in the past.  Plan for trilogy outpatient -Noted bilateral pleural effusions on chest x-ray, that were not duplicated on ultrasound.  Thoracentesis avoided. -Started on Solu-Medrol per pulmonology -Continue on BiPAP at nighttime and high flow nasal cannula in a.m. -Appreciate ongoing pulmonology evaluation -Decreased DuoNeb frequency to every 6 hours due to worsening tachycardia  Acute on chronicdiastolic congestiveheart failure -Echo in 10/21 with LVEF 50%, repeat limited echo with LVEF 55 to 60% on  1/11 -Further diuresis withheld -Currently n.p.o. due to BiPAP needs -Strict input output, daily weights, limit fluid intake -Daily BMP and magnesium  Hyperkalemia-resolved -Recheck in a.m.  Pneumonia-not septic. Appears to beresolving, chest x-ray today shows right lower lobe pneumonia, butchest CT [facility] middle lobe infiltrate on the previous study has nearly resolved with only mild atelectasis remaining.Started outpatient antibiotic course 1/8,with cefuroxime and azithromycin. -Plan to complete at least 5-day course of IV antibiotics, currently day 4 -Follow-upblood cultures ordered in ED, pending  COPD-veryfaint scattered expiratory wheezing on auscultation. -DuoNebs as needed and scheduled. - PRN Mucolytics  Ischemic cardiomyopathy-history ofNSTEMI 2019. Cardiac cath revealed diffuse disease in second marginal branch with 90% stenosis.Medical therapy was recommended with medications to include dual antiplatelets thoughstenting was not performed, Follows with Dr. Conni Elliot. -Resume home medications-aspirin, Plavix, carvedilol, Imdur, atorvastatin, Entresto -Currently cannot take these medications as he is on backup  Diabetes mellitus-hyperglycemia improved -Obtain hemoglobin A1c 8.8% -, Being held at this time -Hold home Metformin -SSI- M, continued for now given adequate blood glucose readings  Tobacco abuse-reports he has cut down his smoking to 2 cigarettes daily.  Morbid obesity BMI 44   DVT prophylaxis:Lovenox Code Status:Full Family Communication:Tried calling spouse with no response 1/13 Disposition Plan: Status is: Inpatient  Remains inpatient appropriate because:Unsafe d/c plan, IV treatments appropriate due to intensity of illness or inability to take PO and Inpatient level of care appropriate due to severity of illness   Dispo: The patient is from:Home Anticipated d/c is  MV:[facility] Anticipated d/c date is: 2-3 days Patient currently is not medically stable to d/c.Patient reports some ongoing diuresis as well as thoracentesis today. Currently on BiPAP.   Consultants:  Pulmonology  Procedures:  See below  Antimicrobials:  Anti-infectives (From admission,  onward)   Start     Dose/Rate Route Frequency Ordered Stop   04/28/20 1700  cefTRIAXone (ROCEPHIN) 2 g in sodium chloride 0.9 % 100 mL IVPB        2 g 200 mL/hr over 30 Minutes Intravenous Every 24 hours 04/27/20 2140     04/27/20 1730  cefTRIAXone (ROCEPHIN) 1 g in sodium chloride 0.9 % 100 mL IVPB        1 g 200 mL/hr over 30 Minutes Intravenous  Once 04/27/20 1719 04/27/20 2024   04/27/20 1730  azithromycin (ZITHROMAX) 500 mg in sodium chloride 0.9 % 250 mL IVPB        500 mg 250 mL/hr over 60 Minutes Intravenous Every 24 hours 04/27/20 1719         Subjective: Patient seen and evaluated today and continues to remain on BiPAP and has significant hypoxemia with removal of BiPAP.  No acute overnight events noted.  He is signaling that he is thirsty.  Objective: Vitals:   04/30/20 0800 04/30/20 0900 04/30/20 1000 04/30/20 1030  BP: (!) 151/50 (!) 117/38 (!) 118/48   Pulse: (!) 124 (!) 107 (!) 135 (!) 117  Resp: (!) 27 (!) 22 (!) 22 (!) 28  Temp: 99.5 F (37.5 C)     TempSrc: Oral     SpO2: (!) 87% 91% (!) 82% (!) 86%  Weight:      Height:        Intake/Output Summary (Last 24 hours) at 04/30/2020 1056 Last data filed at 04/30/2020 0509 Gross per 24 hour  Intake -  Output 3000 ml  Net -3000 ml   Filed Weights   04/28/20 0500 04/29/20 0500 04/30/20 0508  Weight: (!) 138 kg 134.7 kg 132.3 kg    Examination:  General exam: Appears calm and comfortable, morbidly obese Respiratory system: Wheezing noted bilaterally, continues on BiPAP 100% FiO2 with Cardiovascular system: S1 & S2 heard, RRR.  Gastrointestinal system: Abdomen is soft Central nervous  system: Alert and awake Extremities: No edema Skin: No significant lesions noted Psychiatry: Flat affect.    Data Reviewed: I have personally reviewed following labs and imaging studies  CBC: Recent Labs  Lab 04/27/20 1109 04/29/20 0500  WBC 6.5 8.0  HGB 17.0 17.0  HCT 58.8* 61.0*  MCV 90.2 92.6  PLT 211 937   Basic Metabolic Panel: Recent Labs  Lab 04/27/20 1109 04/28/20 0325 04/28/20 1144 04/29/20 0500 04/30/20 0401  NA 139 141 144 143 147*  K 4.3 5.9* 4.1 3.7 4.1  CL 95* 98 98 93* 94*  CO2 32 26 35* 37* 38*  GLUCOSE 277* 160* 111* 111* 171*  BUN 33* 31* 26* 23 38*  CREATININE 1.05 1.01 0.72 0.76 0.94  CALCIUM 8.5* 8.3* 8.4* 8.4* 9.0  MG  --   --   --  2.3 2.4   GFR: Estimated Creatinine Clearance: 108.4 mL/min (by C-G formula based on SCr of 0.94 mg/dL). Liver Function Tests: Recent Labs  Lab 04/27/20 1647  AST 23  ALT 49*  ALKPHOS 141*  BILITOT 0.8  PROT 6.7  ALBUMIN 3.3*   No results for input(s): LIPASE, AMYLASE in the last 168 hours. No results for input(s): AMMONIA in the last 168 hours. Coagulation Profile: No results for input(s): INR, PROTIME in the last 168 hours. Cardiac Enzymes: No results for input(s): CKTOTAL, CKMB, CKMBINDEX, TROPONINI in the last 168 hours. BNP (last 3 results) No results for input(s): PROBNP in the last 8760 hours. HbA1C: Recent Labs  04/27/20 1943  HGBA1C 8.8*   CBG: Recent Labs  Lab 04/29/20 0748 04/29/20 1120 04/29/20 1636 04/29/20 2013 04/30/20 0829  GLUCAP 107* 180* 109* 137* 188*   Lipid Profile: No results for input(s): CHOL, HDL, LDLCALC, TRIG, CHOLHDL, LDLDIRECT in the last 72 hours. Thyroid Function Tests: Recent Labs    04/28/20 1255  TSH 0.481   Anemia Panel: No results for input(s): VITAMINB12, FOLATE, FERRITIN, TIBC, IRON, RETICCTPCT in the last 72 hours. Sepsis Labs: Recent Labs  Lab 04/27/20 1647  LATICACIDVEN 0.8    Recent Results (from the past 240 hour(s))  Blood  culture (routine x 2)     Status: None (Preliminary result)   Collection Time: 04/27/20  4:47 PM   Specimen: Left Antecubital; Blood  Result Value Ref Range Status   Specimen Description LEFT ANTECUBITAL  Final   Special Requests   Final    BOTTLES DRAWN AEROBIC AND ANAEROBIC Blood Culture adequate volume   Culture   Final    NO GROWTH 3 DAYS Performed at [facility], [address] [place], [place] [address]    Report Status PENDING  Incomplete  Resp Panel by RT-PCR (Flu A&B, Covid) Nasopharyngeal Swab     Status: None   Collection Time: 04/27/20  5:20 PM   Specimen: Nasopharyngeal Swab; Nasopharyngeal(NP) swabs in vial transport medium  Result Value Ref Range Status   SARS Coronavirus 2 by RT PCR NEGATIVE NEGATIVE Final    Comment: (NOTE) SARS-CoV-2 target nucleic acids are NOT DETECTED.  The SARS-CoV-2 RNA is generally detectable in upper respiratory specimens during the acute phase of infection. The lowest concentration of SARS-CoV-2 viral copies this assay can detect is 138 copies/mL. A negative result does not preclude SARS-Cov-2 infection and should not be used as the sole basis for treatment or other patient management decisions. A negative result may occur with  improper specimen collection/handling, submission of specimen other than nasopharyngeal swab, presence of viral mutation(s) within the areas targeted by this assay, and inadequate number of viral copies(<138 copies/mL). A negative result must be combined with clinical observations, patient history, and epidemiological information. The expected result is Negative.  Fact Sheet for Patients:  EntrepreneurPulse.com.au  Fact Sheet for Healthcare Providers:  IncredibleEmployment.be  This test is no t yet approved or cleared by the [place] FDA and  has been authorized for detection and/or diagnosis of SARS-CoV-2 by FDA under an Emergency Use Authorization (EUA). This EUA  will remain  in effect (meaning this test can be used) for the duration of the COVID-19 declaration under Section 564(b)(1) of the Act, 21 U.S.C.section 360bbb-3(b)(1), unless the authorization is terminated  or revoked sooner.       Influenza A by PCR NEGATIVE NEGATIVE Final   Influenza B by PCR NEGATIVE NEGATIVE Final    Comment: (NOTE) The Xpert Xpress SARS-CoV-2/FLU/RSV plus assay is intended as an aid in the diagnosis of influenza from Nasopharyngeal swab specimens and should not be used as a sole basis for treatment. Nasal washings and aspirates are unacceptable for Xpert Xpress SARS-CoV-2/FLU/RSV testing.  Fact Sheet for Patients: EntrepreneurPulse.com.au  Fact Sheet for Healthcare Providers: IncredibleEmployment.be  This test is not yet approved or cleared by the [place] FDA and has been authorized for detection and/or diagnosis of SARS-CoV-2 by FDA under an Emergency Use Authorization (EUA). This EUA will remain in effect (meaning this test can be used) for the duration of the COVID-19 declaration under Section 564(b)(1) of the Act, 21 U.S.C. section 360bbb-3(b)(1), unless  the authorization is terminated or revoked.  Performed at [facility], [address] [place], [place] [address]   Blood culture (routine x 2)     Status: None (Preliminary result)   Collection Time: 04/27/20  7:43 PM   Specimen: BLOOD RIGHT ARM  Result Value Ref Range Status   Specimen Description BLOOD RIGHT ARM  Final   Special Requests   Final    BOTTLES DRAWN AEROBIC AND ANAEROBIC Blood Culture adequate volume   Culture   Final    NO GROWTH 3 DAYS Performed at [facility], [address] [place], [place] [address]    Report Status PENDING  Incomplete         Radiology Studies: [place] CHEST (PLEURAL EFFUSION)  Result Date: 04/28/2020 CLINICAL DATA:  Pleural effusions, for thoracentesis EXAM: CHEST ULTRASOUND COMPARISON:  Chest radiograph  04/28/2020 FINDINGS: Ultrasound the chest demonstrates small BILATERAL pleural effusions. Volume of pleural effusions since identified sonographically is insufficient for expected therapeutic benefit from thoracentesis. Volume of fluid identified on the LEFT is less than expected based on chest radiograph. Procedure not performed. IMPRESSION: Small BILATERAL pleural effusions; thoracentesis not performed. Electronically Signed   By: Lavonia Dana M.D.   On: 04/28/2020 11:58   [place] EKG SITE RITE  Result Date: 04/28/2020 If Site Rite image not attached, placement could not be confirmed due to current cardiac rhythm.       Scheduled Meds: . aspirin EC  81 mg Oral Daily  . atorvastatin  80 mg Oral q1800  . canagliflozin  300 mg Oral QAC breakfast  . Chlorhexidine Gluconate Cloth  6 each Topical Daily  . clopidogrel  75 mg Oral Q breakfast  . enoxaparin (LOVENOX) injection  65 mg Subcutaneous QHS  . insulin aspart  0-15 Units Subcutaneous TID WC  . insulin aspart  0-5 Units Subcutaneous QHS  . ipratropium-albuterol  3 mL Nebulization Q6H  . methylPREDNISolone (SOLU-MEDROL) injection  80 mg Intravenous Q12H  . pantoprazole  40 mg Oral Daily   Continuous Infusions: . azithromycin 500 mg (04/29/20 1835)  . cefTRIAXone (ROCEPHIN)  IV 2 g (04/29/20 1723)     LOS: 3 days    Time spent: 35 minutes    Sondra Blixt D Manuella Ghazi, DO [facility]  If 7PM-7AM, please contact night-coverage [email or web address] 04/30/2020, 10:56 AM

## 2020-04-30 NOTE — Progress Notes (Signed)
NAME:  Thomas [name], MRN:  [id number], DOB:  03/31/1956, LOS: 3 ADMISSION DATE:  04/27/2020, CONSULTATION DATE:  1/11 REFERRING MD:  [facility]/ [facility]  CHIEF COMPLAINT:  AMS/ C02 narcosis   Brief History:  27 yowm smoker from [place] with MO complicated by CHF/IHD and DM dx with progressive sob x 2 months and ? pna dx 1/9 [facility] in [place] but left AMA and admitted to [facility] 1/10 with severe hypoxemia/ hypercarbia improved on bipap and PCCM consulted am 1/11  History of Present Illness:  65 y.o. MO male with medical history significant for systolic CHF/ischemic cardiomyopathy, COPD, diabetes mellitus. Patient presented to the ED with complaints of increasing difficulty breathing over the past 2 months, productive cough.  He was evaluated by his primary care doctor on  1/7, prescribed a course of antibiotics for his pneumonia, azithromycin and cefuroxime and breathing treatments.  Patient started medications 1/8 and 1/10 was his 3rd dose.  Reports his symptoms are not worse.  Patient also reports bilateral lower extremity swelling over the past several weeks to months.  He reports compliance with Lasix 40 mg in the morning and 20 mg at night. Patient also reports recent weight gain, reports show  weight within the past week- 282 lb, weight  1/0 300Lbs.   Vaccinated for COVID x 3.  Details from care everywhere- Patient presented to [facility] 1/9 he had a CT scan of his chest with contrast which showed the infiltrate in the anterior right lower lobe along the fissure previously is more bandlike today and could represent chronic  atelectasis, scarring, or less likely recurrence of infiltrate.  There is no identified central mass to explain this bandlike opacity. Recommend attention on follow-up. The right middle lobe infiltrate on the previous study has nearly  resolved with only mild atelectasis remaining right anterior lower lobe pneumonia, with moderate right pleural effusion and associated  atelectasis.    Admission was offered for CHF decompensation and pneumonia, but patient left AMA.  ED Course: O2 sats 77 %, on room air.  On 4 L.  Sats 89 to 92%.  Temperature 97.9.  WBC 6.5.  Portable chest x-ray shows right lower lobe infiltrate and effusion consistent with pneumonia. Ceftriaxone and azithromycin started in the ED.  Hospitalist to admit for further evaluation and management.  Past Medical History:  CHF (congestive heart failure)  Coronary artery disease, Morbid obesity ([place]), NSTEMI (non-ST elevated myocardial infarction) ([place]), COPD (chronic obstructive pulmonary disease)  Pneumonia [place] Type 2 diabetes mellitus ([place]).   Significant Hospital Events:  Added solumedrol 1/12 for airways dz   Consults:  PCCM   Procedures:     Significant Diagnostic Tests:  U/S bilateral chest 1/11 > not enough to tap Echo 1/11 1. Left ventricular ejection fraction, by estimation, is 55 to 60%. The  left ventricle has normal function. The left ventricle has no regional  wall motion abnormalities. Left ventricular diastolic parameters were  normal.  2. Right ventricular systolic function is normal. The right ventricular  size is normal.  3. The mitral valve is normal in structure. No evidence of mitral valve  regurgitation. No evidence of mitral stenosis.  4. The aortic valve was not well visualized.  5. The inferior vena cava is normal in size with greater than 50%  respiratory variability, suggesting right atrial pressure of 3 mmHg.     Micro Data:  Resp viral Panel PCR  1/10>> neg covid/ flu BC x 2  1/10 >>> Urine  Strep  1/11 > neg  Urine Legionella 1/11 >  neg  Antimicrobials:  Zmax  1/10 Rocephin 1/10   Scheduled Meds: . aspirin EC  81 mg Oral Daily  . atorvastatin  80 mg Oral q1800  . canagliflozin  300 mg Oral QAC breakfast  . Chlorhexidine Gluconate Cloth  6 each Topical Daily  . clopidogrel  75 mg Oral Q breakfast  . enoxaparin (LOVENOX)  injection  65 mg Subcutaneous QHS  . insulin aspart  0-15 Units Subcutaneous TID WC  . insulin aspart  0-5 Units Subcutaneous QHS  . ipratropium-albuterol  3 mL Nebulization Q6H  . methylPREDNISolone (SOLU-MEDROL) injection  80 mg Intravenous Q12H  . pantoprazole  40 mg Oral Daily   Continuous Infusions: . azithromycin 500 mg (04/29/20 1835)  . cefTRIAXone (ROCEPHIN)  IV 2 g (04/29/20 1723)   PRN Meds:.acetaminophen **OR** acetaminophen, albuterol, guaiFENesin-dextromethorphan, ondansetron **OR** ondansetron (ZOFRAN) IV, polyethylene glycol   Interim History / Subjective:  Sitting up in chair on nasal 02 s increaed wob req food  Objective   Blood pressure (!) 122/106, pulse (!) 122, temperature 99.5 F (37.5 C), temperature source Oral, resp. rate 17, height 5\' 9"  (1.753 m), weight 132.3 kg, SpO2 91 %.    Vent Mode: BIPAP FiO2 (%):  [100 %] 100 % Set Rate:  [20 bmp] 20 bmp   Intake/Output Summary (Last 24 hours) at 04/30/2020 1459 Last data filed at 04/30/2020 9622 Gross per 24 hour  Intake --  Output 1750 ml  Net -1750 ml   Filed Weights   04/28/20 0500 04/29/20 0500 04/30/20 0508  Weight: (!) 138 kg 134.7 kg 132.3 kg    Examination:     Tmax  99 Pt alert, approp nad sitting up in chair on HFNC No jvd Oropharynx clear,  mucosa nl Neck supple Lungs with a distant  rhonchi bilaterally, decreased in bases RRR no s3 or or sign murmur Abd  Tensely bese with very limited  excursion  Extr warm with no edema or clubbing noted Neuro  Sensorium nows name of hospital ,  no apparent motor deficits         Resolved Hospital Problem list      Assessment & Plan:  1) MO complicated by OHS/ hypoxemic and hypercarbic resp failure aute on chronic  and severe restrictive > obst physiology to point where likely will need trilegy as apparently failed bibap previously  -  Tells me now hasn't been in bed in years - sleeps in recliner at home, so no reason he should be in bed here x at  hs if can't get an adequate recliner here    2) Abn cxr c/w atx though hard to exclude chf/ pna  - no def pleural effusions. >>> IS/ up in chair as much as possible  >>> ABX for CAP see flowsheet  >>> added steroids 1/12 for airways dz and much better air movement 1/13 > continue  3) polycythemia, mild, due to  #1 (not primary likely) worse with diuresis   Lab Results  Component Value Date   HGB 17.0 04/29/2020   HGB 17.0 04/27/2020   HGB 18.6 (H) 12/21/2018    >>>  lovenox a good idea here as high risk dvt/PE , hgb should improvewith adequate sats provided   4) carries dx of  chf but bnp / echo not impressive so ? Why on entresto ? - leave off entresto  / would not diures further   5) Hypernatremia >> liberalize h20  access   Best practice (evaluated daily)  Diet: per [facility] Pain/Anxiety/Delirium protocol (if indicated): per triad/ would minimize VAP protocol (if indicated): DVT prophylaxis: per triad  GI prophylaxis: per triad Glucose control: per triad Mobility: up as tol Disposition:icu  Goals of Care:  Last date of multidisciplinary goals of care discussion:per [facility] Family and staff present:  Summary of discussion:  Follow up goals of care discussion due:  Code Status: full code   Labs   CBC: Recent Labs  Lab 04/27/20 1109 04/29/20 0500  WBC 6.5 8.0  HGB 17.0 17.0  HCT 58.8* 61.0*  MCV 90.2 92.6  PLT 211 397    Basic Metabolic Panel: Recent Labs  Lab 04/27/20 1109 04/28/20 0325 04/28/20 1144 04/29/20 0500 04/30/20 0401  NA 139 141 144 143 147*  K 4.3 5.9* 4.1 3.7 4.1  CL 95* 98 98 93* 94*  CO2 32 26 35* 37* 38*  GLUCOSE 277* 160* 111* 111* 171*  BUN 33* 31* 26* 23 38*  CREATININE 1.05 1.01 0.72 0.76 0.94  CALCIUM 8.5* 8.3* 8.4* 8.4* 9.0  MG  --   --   --  2.3 2.4   GFR: Estimated Creatinine Clearance: 108.4 mL/min (by C-G formula based on SCr of 0.94 mg/dL). Recent Labs  Lab 04/27/20 1109 04/27/20 1647 04/29/20 0500  WBC 6.5  --  8.0   LATICACIDVEN  --  0.8  --     Liver Function Tests: Recent Labs  Lab 04/27/20 1647  AST 23  ALT 49*  ALKPHOS 141*  BILITOT 0.8  PROT 6.7  ALBUMIN 3.3*   No results for input(s): LIPASE, AMYLASE in the last 168 hours. No results for input(s): AMMONIA in the last 168 hours.  ABG    Component Value Date/Time   PHART 7.246 (L) 04/28/2020 0615   PCO2ART 90.9 (HH) 04/28/2020 0615   PO2ART 81.9 (L) 04/28/2020 0615   HCO3 31.1 (H) 04/28/2020 1255   O2SAT 80.2 04/28/2020 1255     Coagulation Profile: No results for input(s): INR, PROTIME in the last 168 hours.  Cardiac Enzymes: No results for input(s): CKTOTAL, CKMB, CKMBINDEX, TROPONINI in the last 168 hours.  HbA1C: Hgb A1c MFr Bld  Date/Time Value Ref Range Status  04/27/2020 07:43 PM 8.8 (H) 4.8 - 5.6 % Final    Comment:    (NOTE) Pre diabetes:          5.7%-6.4%  Diabetes:              >6.4%  Glycemic control for   <7.0% adults with diabetes   12/14/2018 05:36 PM 8.2 (H) 4.8 - 5.6 % Final    Comment:    (NOTE) Pre diabetes:          5.7%-6.4% Diabetes:              >6.4% Glycemic control for   <7.0% adults with diabetes     CBG: Recent Labs  Lab 04/29/20 1120 04/29/20 1636 04/29/20 2013 04/30/20 0829 04/30/20 1159  GLUCAP 180* 109* 137* 188* 258*        The patient is critically ill with multiple organ systems failure and requires high complexity decision making for assessment and support, frequent evaluation and titration of therapies, application of advanced monitoring technologies and extensive interpretation of multiple databases. Critical Care Time devoted to patient care services described in this note is 35 minutes.    Christinia Gully, MD [facility]monary and [place] [phone]   After 7:00  pm call Elink  [phone]

## 2020-04-30 NOTE — TOC Initial Note (Signed)
Transition of Care [facility]) - Initial/Assessment Note   Patient Details  Name: Thomas [name] MRN: [id number] Date of Birth: 06/16/1955  Transition of Care [facility]) CM/SW Contact:    Sherie Don, LCSW Phone Number: 04/30/2020, 4:12 PM  Clinical Narrative: Patient is a 65 year old male who was admitted for pneumonia. Patient has a history of ischemic cardiomyopathy, tobacco abuse, obesity, DM, acute respiratory failure with hypoxia and hypercapnia, and chronic systolic CHF. TOC received consult for CHF screening.  CSW met with patient and wife to complete assessment. Patient resides at home with wife and children. Patient does not have any DME at home and is not active with any HH services at this time. Patient stated he is independent with his ADLs at baseline, but is only able to walk short distances, like to the mailbox. Wife and patient reported he has difficulty affording 2 of his medications each month.  Per CHF screening, patient reported he is not following a heart healthy diet or restricting his salt and fluid intake. Patient is also not completing daily weights. TOC to follow for discharge needs.  Expected Discharge Plan: Home/Self Care Barriers to Discharge: Continued Medical Work up  Patient Goals and CMS Choice Patient states their goals for this hospitalization and ongoing recovery are:: Discharge home CMS Medicare.gov Compare Post Acute Care list provided to:: Patient Choice offered to / list presented to : Patient  Expected Discharge Plan and Services Expected Discharge Plan: Home/Self Care In-house Referral: Clinical Social Work Discharge Planning Services: NA Post Acute Care Choice: Durable Medical Equipment Living arrangements for the past 2 months: Single Family Home  Prior Living Arrangements/Services Living arrangements for the past 2 months: Single Family Home Lives with:: Spouse Patient language and need for interpreter reviewed:: Yes Do you feel safe going back  to the place where you live?: Yes      Need for Family Participation in Patient Care: Yes (Comment) Care giver support system in place?: Yes (comment) Criminal Activity/Legal Involvement Pertinent to Current Situation/Hospitalization: No - Comment as needed  Activities of Daily Living Home Assistive Devices/Equipment: None ADL Screening (condition at time of admission) Patient's cognitive ability adequate to safely complete daily activities?: No Is the patient deaf or have difficulty hearing?: No Does the patient have difficulty seeing, even when wearing glasses/contacts?: No Does the patient have difficulty concentrating, remembering, or making decisions?: No Patient able to express need for assistance with ADLs?: No Does the patient have difficulty dressing or bathing?: No Independently performs ADLs?: Yes (appropriate for developmental age) Does the patient have difficulty walking or climbing stairs?: No Weakness of Legs: None Weakness of Arms/Hands: None  Emotional Assessment Appearance:: Appears stated age Attitude/Demeanor/Rapport: Engaged Affect (typically observed): Accepting Orientation: : Oriented to Self,Oriented to Place,Oriented to  Time,Oriented to Situation Alcohol / Substance Use: Not Applicable Psych Involvement: No (comment)  Admission diagnosis:  PNA (pneumonia) [J18.9] Patient Active Problem List   Diagnosis Date Noted  . PNA (pneumonia) 04/27/2020  . Acute respiratory failure with hypoxia ([place]) 04/27/2020  . Loculated pleural effusion on right 12/19/2018  . Acute respiratory failure with hypoxia and hypercapnia ([place]) 12/14/2018  . Acute on chronic systolic CHF (congestive heart failure) ([place]) 12/14/2018  . Morbid obesity ([place]) 03/31/2018  . Ischemic cardiomyopathy 03/31/2018  . Tobacco abuse 03/31/2018  . DM (diabetes mellitus) ([place]) 03/31/2018  . NSTEMI (non-ST elevated myocardial infarction) ([place]) 03/29/2018   PCP:  Leonie Douglas, MD Pharmacy:    [place], [place] -  [address] [place] [address] Phone: [phone] Fax: [phone]  [facility] [place], [place] - [address] [place] [address] Phone: [phone] Fax: [phone]  [place] [id number] [place] [id number]: [phone]: [phone] Readmission Risk Interventions Readmission Risk Prevention Plan 12/17/2018  Medication Screening Complete  Transportation Screening Complete  Some recent data might be hidden

## 2020-05-01 DIAGNOSIS — J9622 Acute and chronic respiratory failure with hypercapnia: Secondary | ICD-10-CM

## 2020-05-01 DIAGNOSIS — J9621 Acute and chronic respiratory failure with hypoxia: Secondary | ICD-10-CM | POA: Diagnosis not present

## 2020-05-01 LAB — CBC
HCT: 56.7 % — ABNORMAL HIGH (ref 39.0–52.0)
Hemoglobin: 16.1 g/dL (ref 13.0–17.0)
MCH: 25.5 pg — ABNORMAL LOW (ref 26.0–34.0)
MCHC: 28.4 g/dL — ABNORMAL LOW (ref 30.0–36.0)
MCV: 89.7 fL (ref 80.0–100.0)
Platelets: 193 10*3/uL (ref 150–400)
RBC: 6.32 MIL/uL — ABNORMAL HIGH (ref 4.22–5.81)
RDW: 17.2 % — ABNORMAL HIGH (ref 11.5–15.5)
WBC: 7.7 10*3/uL (ref 4.0–10.5)
nRBC: 0 % (ref 0.0–0.2)

## 2020-05-01 LAB — BASIC METABOLIC PANEL
Anion gap: 14 (ref 5–15)
BUN: 45 mg/dL — ABNORMAL HIGH (ref 8–23)
CO2: 38 mmol/L — ABNORMAL HIGH (ref 22–32)
Calcium: 9.1 mg/dL (ref 8.9–10.3)
Chloride: 91 mmol/L — ABNORMAL LOW (ref 98–111)
Creatinine, Ser: 0.84 mg/dL (ref 0.61–1.24)
GFR, Estimated: >60 mL/min (ref >60)
Glucose, Bld: 160 mg/dL — ABNORMAL HIGH (ref 70–99)
Potassium: 3.9 mmol/L (ref 3.5–5.1)
Sodium: 143 mmol/L (ref 135–145)

## 2020-05-01 LAB — GLUCOSE, CAPILLARY
Glucose-Capillary: 207 mg/dL — ABNORMAL HIGH (ref 70–99)
Glucose-Capillary: 259 mg/dL — ABNORMAL HIGH (ref 70–99)
Glucose-Capillary: 206 mg/dL — ABNORMAL HIGH (ref 70–99)
Glucose-Capillary: 293 mg/dL — ABNORMAL HIGH (ref 70–99)

## 2020-05-01 LAB — MAGNESIUM: Magnesium: 2.7 mg/dL — ABNORMAL HIGH (ref 1.7–2.4)

## 2020-05-01 MED ORDER — METHYLPREDNISOLONE SODIUM SUCC 40 MG IJ SOLR
40.0000 mg | Freq: Two times a day (BID) | INTRAMUSCULAR | Status: DC
  Administered 2020-05-02 – 2020-05-04 (×6): 40 mg via INTRAVENOUS
  Filled 2020-05-01 (×6): qty 1

## 2020-05-01 MED ORDER — FUROSEMIDE 10 MG/ML IJ SOLN
40.0000 mg | Freq: Every day | INTRAMUSCULAR | Status: DC
  Administered 2020-05-01 – 2020-05-02 (×2): 40 mg via INTRAVENOUS
  Filled 2020-05-01 (×2): qty 4

## 2020-05-01 NOTE — Progress Notes (Signed)
Foley catheter removed per protocol.

## 2020-05-01 NOTE — TOC Progression Note (Signed)
Transition of Care [facility]) - Progression Note   Patient Details  Name: Thomas [name] MRN: [id number] Date of Birth: 02-Jan-1956  Transition of Care [facility]) CM/SW [place], LCSW Phone Number: 05/01/2020, 12:10 PM   Clinical Narrative: Patient will need to discharge with a Trilogy and likely O2. Trilogy note already completed. CSW spoke with Barbaraann Rondo and Andee Poles with [employer] about ensuring a timely delivery of the Trilogy. Per Barbaraann Rondo, the electronic order for the Trilogy signed by the hospitalist yesterday is sufficient and a DME order in Epic will not be needed. Patient will require O2 orders prior to discharge. Barbaraann Rondo to follow up with weekend CSW. TOC to follow.  Expected Discharge Plan: Home/Self Care Barriers to Discharge: Continued Medical Work up  Expected Discharge Plan and Services Expected Discharge Plan: Home/Self Care In-house Referral: Clinical Social Work Discharge Planning Services: NA Post Acute Care Choice: Durable Medical Equipment Living arrangements for the past 2 months: Single Family Home              DME Arranged: Oxygen,Other see comment (Trilogy) DME Agency: [employer] Date DME Agency Contacted: 05/01/20 Representative spoke with at DME Agency: Barbaraann Rondo  Readmission Risk Interventions Readmission Risk Prevention Plan 12/17/2018  Medication Screening Complete  Transportation Screening Complete  Some recent data might be hidden

## 2020-05-01 NOTE — Progress Notes (Signed)
Patient off BIPAP and back on 15 lpm HFNC.

## 2020-05-01 NOTE — Progress Notes (Signed)
PROGRESS NOTE    Thomas [name]  YTK:[id number] DOB: [birth year] DOA: 04/27/2020 PCP: Leonie Douglas, MD   Brief Narrative:  Thomas [name] a 65 y.o.malewith medical history significant forsystolic CHF/ischemic cardiomyopathy, COPD, diabetes mellitus. Patient presented to the ED with complaints of increasing difficulty breathing over the past 2 months, productive cough.Patient was initially admitted with acute hypoxemic respiratory failure that appears multifactorial in the setting of pneumonia and COPD as well as acute on chronic heart failure. Unfortunately, he has had worsening oxygen saturations and dyspnea noted overnight on 1/11 prompting transfer to ICU and placement on BiPAP.He has been weaned off BiPAP and is currently on high flow nasal cannula oxygen.  He continues to require 9 L nasal cannula oxygen which needs further weaning through today.  Pulmonology following.   Assessment & Plan:   Principal Problem:   PNA (pneumonia) Active Problems:   Morbid obesity ([place])   Ischemic cardiomyopathy   Tobacco abuse   DM (diabetes mellitus) ([place])   Acute respiratory failure with hypoxia and hypercapnia ([facility])   Acute on chronic systolic CHF (congestive heart failure) ([facility])   Acute respiratory failure with hypoxia ([place])   Acuteon chronichypoxicand hypercapnicrespiratory failure-improved -Appears to be multifactorial in the setting of worsening CHF as well as COPD with pneumonia -Appears to have obesity hypoventilation syndrome and has failed BiPAP in the past. Plan for trilogy outpatient -Noted bilateral pleural effusions on chest x-ray, that were not duplicated on ultrasound. Thoracentesis avoided. -Started on Solu-Medrol per pulmonology -Continue on BiPAPat nighttime and high flow nasal cannula in a.m. -Appreciateongoingpulmonology evaluation -Decreased DuoNeb frequency to every 6 hours due to worsening tachycardia -Trying to obtain home trilogy  machine  Acute on chronicdiastolic congestiveheart failure -Echo in 10/21 with LVEF 50%, repeat limited echowith LVEF 55 to 60% on 1/11 -Further diuresis withheld -Currently n.p.o. due to BiPAP needs -Strict input output, daily weights, limit fluid intake  Hyperkalemia-resolved  Pneumonia-not septic. Appears to beresolving, chest x-ray today shows right lower lobe pneumonia, [facility]che[facility] middle lobe infiltrate on the previous study has nearly resolved with only mild atelectasis remaining.Started outpatient antibiotic course 1/8,with cefuroxime and azithromycin. -Plan to complete at least 5-day course of IV antibiotics, currently day 4 -Follow-upblood cultures ordered in ED, pending  COPD-veryfaint scattered expiratory wheezing on auscultation. -DuoNebs as needed and scheduled. - PRN Mucolytics  Ischemic cardiomyopathy-history ofNSTEMI 2019. Cardiac cath revealed diffuse disease in second marginal branch with 90% stenosis.Medical therapy was recommended with medications to include dual antiplatelets thoughstenting was not performed, Follows with Dr. Conni Elliot. -Resume home medications-aspirin, Plavix, carvedilol, Imdur, atorvastatin, Entresto -Currently cannot take these medications as he is on backup  Diabetes mellitus-hyperglycemia improved -Obtain hemoglobin A1c8.8% -, Being held at this time -Hold home Metformin -SSI- M, continued for now given adequate blood glucose readings  Tobacco abuse-reports he has cut down his smoking to 2 cigarettes daily.  Morbid obesity BMI 44   DVT prophylaxis:Lovenox Code Status:Full Family Communication:Tried calling spouse with no response 1/13 Disposition Plan: Status is: Inpatient  Remains inpatient appropriate because:Unsafe d/c plan, IV treatments appropriate due to intensity of illness or inability to take PO and Inpatient level of care appropriate due to severity of  illness   Dispo: The patient is from:Home Anticipated d/c is XN:TZGY Anticipated d/c date is:1-2days Patient currently is not medically stable to d/c. Currently on high flow nasal cannula oxygen.  Needs home trilogy machine   Consultants:  Pulmonology  Procedures:  See below  Antimicrobials:  Anti-infectives (From admission, onward)   Start     Dose/Rate Route Frequency Ordered Stop   04/28/20 1700  cefTRIAXone (ROCEPHIN) 2 g in sodium chloride 0.9 % 100 mL IVPB        2 g 200 mL/hr over 30 Minutes Intravenous Every 24 hours 04/27/20 2140     04/27/20 1730  cefTRIAXone (ROCEPHIN) 1 g in sodium chloride 0.9 % 100 mL IVPB        1 g 200 mL/hr over 30 Minutes Intravenous  Once 04/27/20 1719 04/27/20 2024   04/27/20 1730  azithromycin (ZITHROMAX) 500 mg in sodium chloride 0.9 % 250 mL IVPB        500 mg 250 mL/hr over 60 Minutes Intravenous Every 24 hours 04/27/20 1719         Subjective: Patient seen and evaluated today and is awake and alert on high flow nasal cannula oxygen.  He states that he would like to go home when possible.  And that he would like to see his son.  Objective: Vitals:   05/01/20 0700 05/01/20 0811 05/01/20 0858 05/01/20 0900  BP: (!) 148/63     Pulse: 93 93  99  Resp: 18 19  (!) 24  Temp:      TempSrc:      SpO2: 91% (!) 88% (!) 86% 91%  Weight:      Height:        Intake/Output Summary (Last 24 hours) at 05/01/2020 1038 Last data filed at 05/01/2020 1038 Gross per 24 hour  Intake 998.12 ml  Output 2575 ml  Net -1576.88 ml   Filed Weights   04/29/20 0500 04/30/20 0508 05/01/20 0500  Weight: 134.7 kg 132.3 kg 133.9 kg    Examination:  General exam: Appears calm and comfortable, obese Respiratory system: Diminished breath sounds noted bilaterally.  Currently on 9 L nasal cannula oxygen Cardiovascular system: S1 & S2 heard, RRR.  Gastrointestinal system: Abdomen is soft Central nervous  system: Alert and awake Extremities: Persistent bilateral edema Skin: No significant lesions noted Psychiatry: Flat affect.    Data Reviewed: I have personally reviewed following labs and imaging studies  CBC: Recent Labs  Lab 04/27/20 1109 04/29/20 0500 05/01/20 0516  WBC 6.5 8.0 7.7  HGB 17.0 17.0 16.1  HCT 58.8* 61.0* 56.7*  MCV 90.2 92.6 89.7  PLT 211 206 295   Basic Metabolic Panel: Recent Labs  Lab 04/28/20 0325 04/28/20 1144 04/29/20 0500 04/30/20 0401 05/01/20 0516  NA 141 144 143 147* 143  K 5.9* 4.1 3.7 4.1 3.9  CL 98 98 93* 94* 91*  CO2 26 35* 37* 38* 38*  GLUCOSE 160* 111* 111* 171* 160*  BUN 31* 26* 23 38* 45*  CREATININE 1.01 0.72 0.76 0.94 0.84  CALCIUM 8.3* 8.4* 8.4* 9.0 9.1  MG  --   --  2.3 2.4 2.7*   GFR: Estimated Creatinine Clearance: 122.2 mL/min (by C-G formula based on SCr of 0.84 mg/dL). Liver Function Tests: Recent Labs  Lab 04/27/20 1647  AST 23  ALT 49*  ALKPHOS 141*  BILITOT 0.8  PROT 6.7  ALBUMIN 3.3*   No results for input(s): LIPASE, AMYLASE in the last 168 hours. No results for input(s): AMMONIA in the last 168 hours. Coagulation Profile: No results for input(s): INR, PROTIME in the last 168 hours. Cardiac Enzymes: No results for input(s): CKTOTAL, CKMB, CKMBINDEX, TROPONINI in the last 168 hours. BNP (last 3 results) No results for input(s): PROBNP in the last 8760 hours.  HbA1C: No results for input(s): HGBA1C in the last 72 hours. CBG: Recent Labs  Lab 04/30/20 0829 04/30/20 1159 04/30/20 1657 04/30/20 2024 05/01/20 0755  GLUCAP 188* 258* 205* 179* 207*   Lipid Profile: No results for input(s): CHOL, HDL, LDLCALC, TRIG, CHOLHDL, LDLDIRECT in the last 72 hours. Thyroid Function Tests: Recent Labs    04/28/20 1255  TSH 0.481   Anemia Panel: No results for input(s): VITAMINB12, FOLATE, FERRITIN, TIBC, IRON, RETICCTPCT in the last 72 hours. Sepsis Labs: Recent Labs  Lab 04/27/20 1647  LATICACIDVEN 0.8     Recent Results (from the past 240 hour(s))  Blood culture (routine x 2)     Status: None (Preliminary result)   Collection Time: 04/27/20  4:47 PM   Specimen: Left Antecubital; Blood  Result Value Ref Range Status   Specimen Description LEFT ANTECUBITAL  Final   Special Requests   Final    BOTTLES DRAWN AEROBIC AND ANAEROBIC Blood Culture adequate volume   Culture   Final    NO GROWTH 4 DAYS Performed at [facility], [address], [place], [place] [address]    Report Status PENDING  Incomplete  Resp Panel by RT-PCR (Flu A&B, Covid) Nasopharyngeal Swab     Status: None   Collection Time: 04/27/20  5:20 PM   Specimen: Nasopharyngeal Swab; Nasopharyngeal(NP) swabs in vial transport medium  Result Value Ref Range Status   SARS Coronavirus 2 by RT PCR NEGATIVE NEGATIVE Final    Comment: (NOTE) SARS-CoV-2 target nucleic acids are NOT DETECTED.  The SARS-CoV-2 RNA is generally detectable in upper respiratory specimens during the acute phase of infection. The lowest concentration of SARS-CoV-2 viral copies this assay can detect is 138 copies/mL. A negative result does not preclude SARS-Cov-2 infection and should not be used as the sole basis for treatment or other patient management decisions. A negative result may occur with  improper specimen collection/handling, submission of specimen other than nasopharyngeal swab, presence of viral mutation(s) within the areas targeted by this assay, and inadequate number of viral copies(<138 copies/mL). A negative result must be combined with clinical observations, patient history, and epidemiological information. The expected result is Negative.  Fact Sheet for Patients:  EntrepreneurPulse.com.au  Fact Sheet for Healthcare Providers:  IncredibleEmployment.be  This test is no t yet approved or cleared by the [place] FDA and  has been authorized for detection and/or diagnosis of SARS-CoV-2 by FDA  under an Emergency Use Authorization (EUA). This EUA will remain  in effect (meaning this test can be used) for the duration of the COVID-19 declaration under Section 564(b)(1) of the Act, 21 U.S.C.section 360bbb-3(b)(1), unless the authorization is terminated  or revoked sooner.       Influenza A by PCR NEGATIVE NEGATIVE Final   Influenza B by PCR NEGATIVE NEGATIVE Final    Comment: (NOTE) The Xpert Xpress SARS-CoV-2/FLU/RSV plus assay is intended as an aid in the diagnosis of influenza from Nasopharyngeal swab specimens and should not be used as a sole basis for treatment. Nasal washings and aspirates are unacceptable for Xpert Xpress SARS-CoV-2/FLU/RSV testing.  Fact Sheet for Patients: EntrepreneurPulse.com.au  Fact Sheet for Healthcare Providers: IncredibleEmployment.be  This test is not yet approved or cleared by the [place] FDA and has been authorized for detection and/or diagnosis of SARS-CoV-2 by FDA under an Emergency Use Authorization (EUA). This EUA will remain in effect (meaning this test can be used) for the duration of the COVID-19 declaration under Section 564(b)(1) of the Act, 21  U.S.C. section 360bbb-3(b)(1), unless the authorization is terminated or revoked.  Performed at [facility], [address] [place], [place] [address]   Blood culture (routine x 2)     Status: None (Preliminary result)   Collection Time: 04/27/20  7:43 PM   Specimen: BLOOD RIGHT ARM  Result Value Ref Range Status   Specimen Description BLOOD RIGHT ARM  Final   Special Requests   Final    BOTTLES DRAWN AEROBIC AND ANAEROBIC Blood Culture adequate volume   Culture   Final    NO GROWTH 4 DAYS Performed at [facility], [address] [place], [place] [address]    Report Status PENDING  Incomplete         Radiology Studies: DG Chest Port 1 View  Result Date: 04/30/2020 CLINICAL DATA:  Shortness of breath EXAM: PORTABLE CHEST 1 VIEW  COMPARISON:  04/28/2020 FINDINGS: Small bilateral pleural effusions. Significantly improved aeration of the left lung compared to prior. Patchy bibasilar airspace opacities. Stable heart size. No pneumothorax. IMPRESSION: 1. Significantly improved aeration of the left lung compared to prior. 2. Small bilateral pleural effusions with patchy bibasilar airspace opacities, atelectasis versus pneumonia. Electronically Signed   By: Davina Poke D.O.   On: 04/30/2020 15:47        Scheduled Meds: . aspirin EC  81 mg Oral Daily  . atorvastatin  80 mg Oral q1800  . canagliflozin  300 mg Oral QAC breakfast  . Chlorhexidine Gluconate Cloth  6 each Topical Daily  . clopidogrel  75 mg Oral Q breakfast  . enoxaparin (LOVENOX) injection  65 mg Subcutaneous QHS  . insulin aspart  0-15 Units Subcutaneous TID WC  . insulin aspart  0-5 Units Subcutaneous QHS  . ipratropium-albuterol  3 mL Nebulization Q6H  . methylPREDNISolone (SOLU-MEDROL) injection  80 mg Intravenous Q12H  . pantoprazole  40 mg Oral Daily   Continuous Infusions: . azithromycin Stopped (04/30/20 1841)  . cefTRIAXone (ROCEPHIN)  IV Stopped (04/30/20 1728)     LOS: 4 days    Time spent: 35 minutes    Pratik D Manuella Ghazi, DO [facility]  If 7PM-7AM, please contact night-coverage [email or web address] 05/01/2020, 10:38 AM

## 2020-05-01 NOTE — Progress Notes (Signed)
NAME:  Thomas [name], MRN:  [id number], DOB:  03/25/1956, LOS: 4 ADMISSION DATE:  04/27/2020, CONSULTATION DATE:  1/11 REFERRING MD:  [facility]  CHIEF COMPLAINT:  AMS/ C02 narcosis   Brief History:  35 yowm smoker from [place] with MO complicated by CHF/IHD and DM dx with progressive sob x 2 months and ? pna dx 1/9 [facility] in [place] but left AMA and admitted to [facility] 1/10 with severe hypoxemia/ hypercarbia improved on bipap and PCCM consulted am 1/11  History of Present Illness:  65 y.o. MO male with medical history significant for systolic CHF/ischemic cardiomyopathy, COPD, diabetes mellitus. Patient presented to the ED with complaints of increasing difficulty breathing over the past 2 months, productive cough.  He was evaluated by his primary care doctor on  1/7, prescribed a course of antibiotics for his pneumonia, azithromycin and cefuroxime and breathing treatments.  Patient started medications 1/8 and 1/10 was his 3rd dose.  Reports his symptoms are not worse.  Patient also reports bilateral lower extremity swelling over the past several weeks to months.  He reports compliance with Lasix 40 mg in the morning and 20 mg at night. Patient also reports recent weight gain, reports show  weight within the past week- 282 lb, weight  1/0 300Lbs.   Vaccinated for COVID x 3.  Details from care everywhere- Patient presented to [facility] 1/9 he had a CT scan of his chest with contrast which showed the infiltrate in the anterior right lower lobe along the fissure previously is more bandlike today and could represent chronic  atelectasis, scarring, or less likely recurrence of infiltrate.  There is no identified central mass to explain this bandlike opacity. Recommend attention on follow-up. The right middle lobe infiltrate on the previous study has nearly  resolved with only mild atelectasis remaining right anterior lower lobe pneumonia, with moderate right pleural effusion and associated  atelectasis.    Admission was offered for CHF decompensation and pneumonia, but patient left AMA.  ED Course: O2 sats 77 %, on room air.  On 4 L.  Sats 89 to 92%.  Temperature 97.9.  WBC 6.5.  Portable chest x-ray shows right lower lobe infiltrate and effusion consistent with pneumonia. Ceftriaxone and azithromycin started in the ED.  Hospitalist to admit for further evaluation and management.  Past Medical History:  CHF (congestive heart failure)  Coronary artery disease, Morbid obesity ([place]), NSTEMI (non-ST elevated myocardial infarction) ([place]), COPD (chronic obstructive pulmonary disease)  Pneumonia Sn[place] Type 2 diabetes mellitus ([place]).   Significant Hospital Events:  Added solumedrol 1/12 for airways dz   Consults:  PCCM   Procedures:     Significant Diagnostic Tests:  U/S bilateral chest 1/11 > not enough to tap Echo 1/11 1. Left ventricular ejection fraction, by estimation, is 55 to 60%. The  left ventricle has normal function. The left ventricle has no regional  wall motion abnormalities. Left ventricular diastolic parameters were  normal.  2. Right ventricular systolic function is normal. The right ventricular  size is normal.  3. The mitral valve is normal in structure. No evidence of mitral valve  regurgitation. No evidence of mitral stenosis.  4. The aortic valve was not well visualized.  5. The inferior vena cava is normal in size with greater than 50%  respiratory variability, suggesting right atrial pressure of 3 mmHg.   CT chest with contrast/[facility] 04/26/2020 There is a new nodule in the left lower lobe measuring 11 x a by  8 mm with a mean size of 9 mm. There is a 6 mm nodule in the left  upper lobe    Micro Data:  Resp viral Panel PCR  1/10>> neg covid/ flu BC x 2  1/10 >>>ng Urine Strep  1/11 > neg  Urine Legionella 1/11 >  neg  Antimicrobials:  Zmax  1/10 Rocephin 1/10   Scheduled Meds: . aspirin EC  81 mg Oral Daily  . atorvastatin  80  mg Oral q1800  . canagliflozin  300 mg Oral QAC breakfast  . Chlorhexidine Gluconate Cloth  6 each Topical Daily  . clopidogrel  75 mg Oral Q breakfast  . enoxaparin (LOVENOX) injection  65 mg Subcutaneous QHS  . insulin aspart  0-15 Units Subcutaneous TID WC  . insulin aspart  0-5 Units Subcutaneous QHS  . ipratropium-albuterol  3 mL Nebulization Q6H  . methylPREDNISolone (SOLU-MEDROL) injection  80 mg Intravenous Q12H  . pantoprazole  40 mg Oral Daily   Continuous Infusions: . azithromycin Stopped (04/30/20 1841)  . cefTRIAXone (ROCEPHIN)  IV Stopped (04/30/20 1728)   PRN Meds:.acetaminophen **OR** acetaminophen, albuterol, guaiFENesin-dextromethorphan, ondansetron **OR** ondansetron (ZOFRAN) IV, polyethylene glycol   Interim History / Subjective:   Sitting up in a chair, breathing better. Critically ill Came off BiPAP around 5 AM to 15 L high flow nasal cannula. Currently on 8 L with saturation 86%   Objective   Blood pressure (!) 159/71, pulse 95, temperature (!) 97.4 F (36.3 C), resp. rate (!) 24, height 5\' 9"  (1.753 m), weight 133.9 kg, SpO2 91 %.    Vent Mode: BIPAP FiO2 (%):  [100 %] 100 % Set Rate:  [20 bmp] 20 bmp PEEP:  [8 cmH20] 8 cmH20   Intake/Output Summary (Last 24 hours) at 05/01/2020 1234 Last data filed at 05/01/2020 1038 Gross per 24 hour  Intake 998.12 ml  Output 2575 ml  Net -1576.88 ml   Filed Weights   04/29/20 0500 04/30/20 0508 05/01/20 0500  Weight: 134.7 kg 132.3 kg 133.9 kg    Examination:      Gen. Pleasant, obese, in no distress, normal affect, on high flow nasal cannula ENT - no pallor,icterus, no post nasal drip, class 2 airway Neck: No JVD, no thyromegaly, no carotid bruits Lungs: no use of accessory muscles, no dullness to percussion, decreased without rales or rhonchi  Cardiovascular: Rhythm regular, heart sounds  normal, no murmurs or gallops, 1+ peripheral edema Abdomen: soft and non-tender, no hepatosplenomegaly, BS  normal. Musculoskeletal: No deformities, no cyanosis or clubbing Neuro:  alert, non focal, no tremors   Chest x-ray shows improved aeration on left Labs show normal electrolytes, no leukocytosis, mild polycythemia    Resolved Hospital Problem list      Assessment & Plan:  1) MO complicated by OHS/ hypoxemic and hypercarbic resp failure aute on chronic  and severe restrictive > obst physiology  -   sleeps in recliner at home  -Continue nocturnal BiPAP, may need NIV -Will need this on discharge , not clear what work-up he has had for OSA in the past   2) left atelectasis that developed after admission , now resolved though hard to exclude chf/ pna  - no def pleural effusions. >>> IS/ up in chair as much as possible  >>> Can DC azithromycin complete ceftriaxone for 7 days >>> added steroids 1/12 for airways dz , decrease Solu-Medrol to 40 every 12  3) polycythemia, mild, due to  #1 (not primary likely) worse with diuresis  Lab Results  Component Value Date   HGB 16.1 05/01/2020   HGB 17.0 04/29/2020   HGB 17.0 04/27/2020    >>>  lovenox a good idea here as high risk dvt/PE , hgb should improvewith adequate sats provided   4) carries dx of  chf but bnp / echo not impressive so ? Why on entresto ? Pulmonary hypertension on CT but echo does not correlate - leave off entresto  -Resume Lasix 40 daily, 5 L negative so far    Labs   CBC: Recent Labs  Lab 04/27/20 1109 04/29/20 0500 05/01/20 0516  WBC 6.5 8.0 7.7  HGB 17.0 17.0 16.1  HCT 58.8* 61.0* 56.7*  MCV 90.2 92.6 89.7  PLT 211 206 846    Basic Metabolic Panel: Recent Labs  Lab 04/28/20 0325 04/28/20 1144 04/29/20 0500 04/30/20 0401 05/01/20 0516  NA 141 144 143 147* 143  K 5.9* 4.1 3.7 4.1 3.9  CL 98 98 93* 94* 91*  CO2 26 35* 37* 38* 38*  GLUCOSE 160* 111* 111* 171* 160*  BUN 31* 26* 23 38* 45*  CREATININE 1.01 0.72 0.76 0.94 0.84  CALCIUM 8.3* 8.4* 8.4* 9.0 9.1  MG  --   --  2.3 2.4 2.7*    GFR: Estimated Creatinine Clearance: 122.2 mL/min (by C-G formula based on SCr of 0.84 mg/dL). Recent Labs  Lab 04/27/20 1109 04/27/20 1647 04/29/20 0500 05/01/20 0516  WBC 6.5  --  8.0 7.7  LATICACIDVEN  --  0.8  --   --     Liver Function Tests: Recent Labs  Lab 04/27/20 1647  AST 23  ALT 49*  ALKPHOS 141*  BILITOT 0.8  PROT 6.7  ALBUMIN 3.3*   No results for input(s): LIPASE, AMYLASE in the last 168 hours. No results for input(s): AMMONIA in the last 168 hours.  ABG    Component Value Date/Time   PHART 7.246 (L) 04/28/2020 0615   PCO2ART 90.9 (HH) 04/28/2020 0615   PO2ART 81.9 (L) 04/28/2020 0615   HCO3 31.1 (H) 04/28/2020 1255   O2SAT 80.2 04/28/2020 1255     Kara Mead MD. FCCP. [facility] See Amion for pager  If no response to pager , please call [phone]  After 7:00 pm call Elink  [phone]   05/01/2020

## 2020-05-01 NOTE — Care Management Important Message (Signed)
Important Message  Patient Details  Name: Thomas [name] MRN: [id number] Date of Birth: 1955-05-04   Medicare Important Message Given:  Yes     [name] 05/01/2020, 1:06 PM

## 2020-05-01 NOTE — TOC Progression Note (Signed)
Transition of Care [facility]) - Progression Note    Patient Details  Name: Thomas [name] MRN: [id number] Date of Birth: 11/09/1955  Transition of Care [facility]) CM/SW Contact  Salome Arnt, [place] Phone Number: 05/01/2020, 12:46 PM  Clinical Narrative:  Discussed HHRN with pt's wife who is agreeable to refer to [facility]. Cory with [facility] accepts. MD notified for [facility] order. TOC will follow.       Expected Discharge Plan: Home/Self Care Barriers to Discharge: Continued Medical Work up  Expected Discharge Plan and Services Expected Discharge Plan: Home/Self Care In-house Referral: Clinical Social Work Discharge Planning Services: NA Post Acute Care Choice: Durable Medical Equipment Living arrangements for the past 2 months: Single Family Home                 DME Arranged: Oxygen,Other see comment ([facility]) DME Agency: [employer] Date DME Agency Contacted: 05/01/20   Representative spoke with at DME Agency: [place] Determinants of Health ([place]) Interventions    Readmission Risk Interventions Readmission Risk Prevention Plan 12/17/2018  Medication Screening Complete  Transportation Screening Complete  Some recent data might be hidden

## 2020-05-02 LAB — GLUCOSE, CAPILLARY
Glucose-Capillary: 223 mg/dL — ABNORMAL HIGH (ref 70–99)
Glucose-Capillary: 178 mg/dL — ABNORMAL HIGH (ref 70–99)
Glucose-Capillary: 250 mg/dL — ABNORMAL HIGH (ref 70–99)
Glucose-Capillary: 204 mg/dL — ABNORMAL HIGH (ref 70–99)

## 2020-05-02 LAB — CULTURE, BLOOD (ROUTINE X 2)
Culture: NO GROWTH
Culture: NO GROWTH
Special Requests: ADEQUATE
Special Requests: ADEQUATE

## 2020-05-02 MED ORDER — FUROSEMIDE 10 MG/ML IJ SOLN
40.0000 mg | Freq: Two times a day (BID) | INTRAMUSCULAR | Status: DC
  Administered 2020-05-02 – 2020-05-05 (×6): 40 mg via INTRAVENOUS
  Filled 2020-05-02 (×6): qty 4

## 2020-05-02 MED ORDER — INSULIN ASPART 100 UNIT/ML ~~LOC~~ SOLN
0.0000 [IU] | Freq: Every day | SUBCUTANEOUS | Status: DC
  Administered 2020-05-02: 1 [IU] via SUBCUTANEOUS
  Administered 2020-05-03 – 2020-05-04 (×2): 2 [IU] via SUBCUTANEOUS

## 2020-05-02 MED ORDER — INSULIN ASPART 100 UNIT/ML ~~LOC~~ SOLN
0.0000 [IU] | Freq: Three times a day (TID) | SUBCUTANEOUS | Status: DC
  Administered 2020-05-02: 7 [IU] via SUBCUTANEOUS
  Administered 2020-05-02: 4 [IU] via SUBCUTANEOUS
  Administered 2020-05-03 (×2): 7 [IU] via SUBCUTANEOUS
  Administered 2020-05-03: 11 [IU] via SUBCUTANEOUS
  Administered 2020-05-04: 4 [IU] via SUBCUTANEOUS
  Administered 2020-05-04: 7 [IU] via SUBCUTANEOUS
  Administered 2020-05-04: 3 [IU] via SUBCUTANEOUS
  Administered 2020-05-05: 11 [IU] via SUBCUTANEOUS
  Administered 2020-05-05: 3 [IU] via SUBCUTANEOUS

## 2020-05-02 NOTE — Progress Notes (Signed)
SATURATION QUALIFICATIONS: (This note is used to comply with regulatory documentation for home oxygen)  Patient Saturations on Room Air at Rest = 83%  Patient Saturations on Room air walking  78%  Patient Saturations on 3 Liters of oxygen while Ambulating = 80%  Please briefly explain why patient needs home oxygen: Extremely short of breath only 85% 3 lpm resting.

## 2020-05-02 NOTE — Progress Notes (Signed)
PROGRESS NOTE    Thomas [name]  NLG:[id number] DOB: 1955/08/27 DOA: 04/27/2020 PCP: Leonie Douglas, MD   Brief Narrative:  Thomas [name] a 65 y.o.malewith medical history significant forsystolic CHF/ischemic cardiomyopathy, COPD, diabetes mellitus. Patient presented to the ED with complaints of increasing difficulty breathing over the past 2 months, productive cough.Patient was initially admitted with acute hypoxemic respiratory failure that appears multifactorial in the setting of pneumonia and COPD as well as acute on chronic heart failure. Unfortunately, he has had worsening oxygen saturations and dyspnea noted overnight on 1/11 prompting transfer to ICU and placement on BiPAP.He has been weaned off BiPAP and is currently on high flow nasal cannula oxygen.  He continues to require 4 L nasal cannula oxygen which needs further weaning through today, but still remains around the 80th percentile. Continue diuresis.  Pulmonology following.   Assessment & Plan:   Principal Problem:   PNA (pneumonia) Active Problems:   Morbid obesity ([place])   Ischemic cardiomyopathy   Tobacco abuse   DM (diabetes mellitus) ([place])   Acute respiratory failure with hypoxia and hypercapnia (H[facility])   Acute on chronic systolic CHF (congestive heart failure) (H[facility])   Acute respiratory failure with hypoxia (H[facility])   Acute on chronic respiratory failure with hypoxia and hypercapnia ([place])   Acuteon chronichypoxicand hypercapnicrespiratory failure-improved -Appears to be multifactorial in the setting of worsening CHF as well as COPD with pneumonia -Appears to have obesity hypoventilation syndrome and has failed BiPAP in the past. Plan for [facility] outpatient -Noted bilateral pleural effusions on chest x-ray, that were not duplicated on ultrasound. Thoracentesis avoided. -Continue on Solu-Medrol per pulmonology -Lasix to BID dosing as noted below -Continue on BiPAPat nighttime and high flow nasal  cannula in a.m. -Appreciateongoingpulmonology evaluation -Decreased DuoNeb frequency to every 6 hours due to worsening tachycardia -Trying to obtain home trilogy machine  Acute on chronicdiastolic congestiveheart failure -Echo in 10/21 with LVEF 50%, repeat limited echowith LVEF 55 to 60% on 1/11 -Further diuresis now with Lasix IV BID due to ongoing volume overload -Currently n.p.o. due to BiPAP needs -Strict input output, daily weights, limit fluid intake  Hyperkalemia-resolved -Follow am labs  Pneumonia-not septic. Appears to beresolving, chest x-ray today shows right lower lobe pneumonia, [facility] middle lobe infiltrate on the previous study has nearly resolved with only mild atelectasis remaining.Started outpatient antibiotic course 1/8,with cefuroxime and azithromycin. -Plan to complete at least 5-day course of IV antibiotics, currently day5, dc in am -Follow-upblood cultures ordered in ED, pending  COPD-veryfaint scattered expiratory wheezing on auscultation. -DuoNebs as needed and scheduled. - PRN Mucolytics  Ischemic cardiomyopathy-history ofNSTEMI 2019. Cardiac cath revealed diffuse disease in second marginal branch with 90% stenosis.Medical therapy was recommended with medications to include dual antiplatelets thoughstenting was not performed, Follows with Dr. Conni Elliot. -Resume home medications-aspirin, Plavix, carvedilol, Imdur, atorvastatin, Entresto -Currently cannot take these medications as he is on backup  Diabetes mellitus-hyperglycemia persistent -Obtain hemoglobin A1c8.8% -Hold home Metformin -SSI- R, increased due to hyperglycemia  Tobacco abuse-reports he has cut down his smoking to 2 cigarettes daily.  Morbid obesity BMI 44   DVT prophylaxis:Lovenox Code Status:Full Family Communication:Called spouse on 1/15 at [phone] Disposition Plan: Status is: Inpatient  Remains  inpatient appropriate because:Unsafe d/c plan, IV treatments appropriate due to intensity of illness or inability to take PO and Inpatient level of care appropriate due to severity of illness   Dispo: The patient is from:Home Anticipated d/c is DJ:[facility] Anticipated d/c date is:2-3days Patient  currently is not medically stable to d/c. Currently on high flow nasal cannula oxygen.  Needs home [facility] machine arranged. Ongoing diuresis.   Consultants:  Pulmonology  Procedures:  See below  Antimicrobials:  Anti-infectives (From admission, onward)   Start     Dose/Rate Route Frequency Ordered Stop   04/28/20 1700  cefTRIAXone (ROCEPHIN) 2 g in sodium chloride 0.9 % 100 mL IVPB        2 g 200 mL/hr over 30 Minutes Intravenous Every 24 hours 04/27/20 2140     04/27/20 1730  cefTRIAXone (ROCEPHIN) 1 g in sodium chloride 0.9 % 100 mL IVPB        1 g 200 mL/hr over 30 Minutes Intravenous  Once 04/27/20 1719 04/27/20 2024   04/27/20 1730  azithromycin (ZITHROMAX) 500 mg in sodium chloride 0.9 % 250 mL IVPB        500 mg 250 mL/hr over 60 Minutes Intravenous Every 24 hours 04/27/20 1719         Subjective: Patient seen and evaluated today and is eager to go home.  He denies any significant shortness of breath, but is still quite volume overloaded.  He desaturates significantly with ambulation and his heart rate goes up.  Objective: Vitals:   05/02/20 0800 05/02/20 0818 05/02/20 0825 05/02/20 0900  BP: (!) 149/67   (!) 160/89  Pulse: 86  81 100  Resp: (!) 24  14 (!) 25  Temp:   98.5 F (36.9 C)   TempSrc:   Oral   SpO2: (!) 86% (!) 85% 92% (!) 86%  Weight:      Height:        Intake/Output Summary (Last 24 hours) at 05/02/2020 1042 Last data filed at 05/02/2020 1009 Gross per 24 hour  Intake 950 ml  Output 4825 ml  Net -3875 ml   Filed Weights   04/29/20 0500 04/30/20 0508 05/01/20 0500  Weight: 134.7 kg 132.3 kg 133.9 kg     Examination:  General exam: Appears calm and comfortable , morbidly obese Respiratory system: Diminished to auscultation. Respiratory effort normal. Currently on 4L [place]. Cardiovascular system: S1 & S2 heard, RRR.  Gastrointestinal system: Abdomen is soft Central nervous system: Alert and awake Extremities: bilateral 2+ edema Skin: No significant lesions noted Psychiatry: Flat affect.    Data Reviewed: I have personally reviewed following labs and imaging studies  CBC: Recent Labs  Lab 04/27/20 1109 04/29/20 0500 05/01/20 0516  WBC 6.5 8.0 7.7  HGB 17.0 17.0 16.1  HCT 58.8* 61.0* 56.7*  MCV 90.2 92.6 89.7  PLT 211 206 417   Basic Metabolic Panel: Recent Labs  Lab 04/28/20 0325 04/28/20 1144 04/29/20 0500 04/30/20 0401 05/01/20 0516  NA 141 144 143 147* 143  K 5.9* 4.1 3.7 4.1 3.9  CL 98 98 93* 94* 91*  CO2 26 35* 37* 38* 38*  GLUCOSE 160* 111* 111* 171* 160*  BUN 31* 26* 23 38* 45*  CREATININE 1.01 0.72 0.76 0.94 0.84  CALCIUM 8.3* 8.4* 8.4* 9.0 9.1  MG  --   --  2.3 2.4 2.7*   GFR: Estimated Creatinine Clearance: 122.2 mL/min (by C-G formula based on SCr of 0.84 mg/dL). Liver Function Tests: Recent Labs  Lab 04/27/20 1647  AST 23  ALT 49*  ALKPHOS 141*  BILITOT 0.8  PROT 6.7  ALBUMIN 3.3*   No results for input(s): LIPASE, AMYLASE in the last 168 hours. No results for input(s): AMMONIA in the last 168 hours. Coagulation Profile: No  results for input(s): INR, PROTIME in the last 168 hours. Cardiac Enzymes: No results for input(s): CKTOTAL, CKMB, CKMBINDEX, TROPONINI in the last 168 hours. BNP (last 3 results) No results for input(s): PROBNP in the last 8760 hours. HbA1C: No results for input(s): HGBA1C in the last 72 hours. CBG: Recent Labs  Lab 05/01/20 0755 05/01/20 1128 05/01/20 1608 05/01/20 1951 05/02/20 0824  GLUCAP 207* 259* 206* 293* 223*   Lipid Profile: No results for input(s): CHOL, HDL, LDLCALC, TRIG, CHOLHDL, LDLDIRECT in  the last 72 hours. Thyroid Function Tests: No results for input(s): TSH, T4TOTAL, FREET4, T3FREE, THYROIDAB in the last 72 hours. Anemia Panel: No results for input(s): VITAMINB12, FOLATE, FERRITIN, TIBC, IRON, RETICCTPCT in the last 72 hours. Sepsis Labs: Recent Labs  Lab 04/27/20 1647  LATICACIDVEN 0.8    Recent Results (from the past 240 hour(s))  Blood culture (routine x 2)     Status: None   Collection Time: 04/27/20  4:47 PM   Specimen: Left Antecubital; Blood  Result Value Ref Range Status   Specimen Description LEFT ANTECUBITAL  Final   Special Requests   Final    BOTTLES DRAWN AEROBIC AND ANAEROBIC Blood Culture adequate volume   Culture   Final    NO GROWTH 5 DAYS Performed at [facility], [address], [place], [place] [address]    Report Status 05/02/2020 FINAL  Final  Resp Panel by RT-PCR (Flu A&B, Covid) Nasopharyngeal Swab     Status: None   Collection Time: 04/27/20  5:20 PM   Specimen: Nasopharyngeal Swab; Nasopharyngeal(NP) swabs in vial transport medium  Result Value Ref Range Status   SARS Coronavirus 2 by RT PCR NEGATIVE NEGATIVE Final    Comment: (NOTE) SARS-CoV-2 target nucleic acids are NOT DETECTED.  The SARS-CoV-2 RNA is generally detectable in upper respiratory specimens during the acute phase of infection. The lowest concentration of SARS-CoV-2 viral copies this assay can detect is 138 copies/mL. A negative result does not preclude SARS-Cov-2 infection and should not be used as the sole basis for treatment or other patient management decisions. A negative result may occur with  improper specimen collection/handling, submission of specimen other than nasopharyngeal swab, presence of viral mutation(s) within the areas targeted by this assay, and inadequate number of viral copies(<138 copies/mL). A negative result must be combined with clinical observations, patient history, and epidemiological information. The expected result is  Negative.  Fact Sheet for Patients:  EntrepreneurPulse.com.au  Fact Sheet for Healthcare Providers:  IncredibleEmployment.be  This test is no t yet approved or cleared by the [place] FDA and  has been authorized for detection and/or diagnosis of SARS-CoV-2 by FDA under an Emergency Use Authorization (EUA). This EUA will remain  in effect (meaning this test can be used) for the duration of the COVID-19 declaration under Section 564(b)(1) of the Act, 21 U.S.C.section 360bbb-3(b)(1), unless the authorization is terminated  or revoked sooner.       Influenza A by PCR NEGATIVE NEGATIVE Final   Influenza B by PCR NEGATIVE NEGATIVE Final    Comment: (NOTE) The Xpert Xpress SARS-CoV-2/FLU/RSV plus assay is intended as an aid in the diagnosis of influenza from Nasopharyngeal swab specimens and should not be used as a sole basis for treatment. Nasal washings and aspirates are unacceptable for Xpert Xpress SARS-CoV-2/FLU/RSV testing.  Fact Sheet for Patients: EntrepreneurPulse.com.au  Fact Sheet for Healthcare Providers: IncredibleEmployment.be  This test is not yet approved or cleared by the [place] and has been authorized  for detection and/or diagnosis of SARS-CoV-2 by FDA under an Emergency Use Authorization (EUA). This EUA will remain in effect (meaning this test can be used) for the duration of the COVID-19 declaration under Section 564(b)(1) of the Act, 21 U.S.C. section 360bbb-3(b)(1), unless the authorization is terminated or revoked.  Performed at [facility], [address], [place], [place] [address]   Blood culture (routine x 2)     Status: None   Collection Time: 04/27/20  7:43 PM   Specimen: BLOOD RIGHT ARM  Result Value Ref Range Status   Specimen Description BLOOD RIGHT ARM  Final   Special Requests   Final    BOTTLES DRAWN AEROBIC AND ANAEROBIC Blood Culture adequate volume    Culture   Final    NO GROWTH 5 DAYS Performed at [facility], [address] [place],  [address]    Report Status 05/02/2020 FINAL  Final         Radiology Studies: [facility] Chest Port 1 View  Result Date: 04/30/2020 CLINICAL DATA:  Shortness of breath EXAM: PORTABLE CHEST 1 VIEW COMPARISON:  04/28/2020 FINDINGS: Small bilateral pleural effusions. Significantly improved aeration of the left lung compared to prior. Patchy bibasilar airspace opacities. Stable heart size. No pneumothorax. IMPRESSION: 1. Significantly improved aeration of the left lung compared to prior. 2. Small bilateral pleural effusions with patchy bibasilar airspace opacities, atelectasis versus pneumonia. Electronically Signed   By: Davina Poke D.O.   On: 04/30/2020 15:47        Scheduled Meds: . aspirin EC  81 mg Oral Daily  . atorvastatin  80 mg Oral q1800  . canagliflozin  300 mg Oral QAC breakfast  . Chlorhexidine Gluconate Cloth  6 each Topical Daily  . clopidogrel  75 mg Oral Q breakfast  . enoxaparin (LOVENOX) injection  65 mg Subcutaneous QHS  . furosemide  40 mg Intravenous Q12H  . insulin aspart  0-20 Units Subcutaneous TID WC  . insulin aspart  0-5 Units Subcutaneous QHS  . ipratropium-albuterol  3 mL Nebulization Q6H  . methylPREDNISolone (SOLU-MEDROL) injection  40 mg Intravenous Q12H  . pantoprazole  40 mg Oral Daily   Continuous Infusions: . azithromycin 500 mg (05/01/20 1840)  . cefTRIAXone (ROCEPHIN)  IV 2 g (05/01/20 1738)     LOS: 5 days    Time spent: 35 minutes    Shontae Rosiles D Manuella Ghazi, DO [facility]  If 7PM-7AM, please contact night-coverage [email or web address] 05/02/2020, 10:42 AM

## 2020-05-03 LAB — BASIC METABOLIC PANEL
Anion gap: 16 — ABNORMAL HIGH (ref 5–15)
BUN: 36 mg/dL — ABNORMAL HIGH (ref 8–23)
CO2: 40 mmol/L — ABNORMAL HIGH (ref 22–32)
Calcium: 8.9 mg/dL (ref 8.9–10.3)
Chloride: 87 mmol/L — ABNORMAL LOW (ref 98–111)
Creatinine, Ser: 0.74 mg/dL (ref 0.61–1.24)
GFR, Estimated: >60 mL/min (ref >60)
Glucose, Bld: 121 mg/dL — ABNORMAL HIGH (ref 70–99)
Potassium: 3.5 mmol/L (ref 3.5–5.1)
Sodium: 143 mmol/L (ref 135–145)

## 2020-05-03 LAB — GLUCOSE, CAPILLARY
Glucose-Capillary: 204 mg/dL — ABNORMAL HIGH (ref 70–99)
Glucose-Capillary: 263 mg/dL — ABNORMAL HIGH (ref 70–99)
Glucose-Capillary: 211 mg/dL — ABNORMAL HIGH (ref 70–99)
Glucose-Capillary: 224 mg/dL — ABNORMAL HIGH (ref 70–99)

## 2020-05-03 LAB — CBC
HCT: 58.9 % — ABNORMAL HIGH (ref 39.0–52.0)
Hemoglobin: 17 g/dL (ref 13.0–17.0)
MCH: 25.3 pg — ABNORMAL LOW (ref 26.0–34.0)
MCHC: 28.9 g/dL — ABNORMAL LOW (ref 30.0–36.0)
MCV: 87.5 fL (ref 80.0–100.0)
Platelets: 188 10*3/uL (ref 150–400)
RBC: 6.73 MIL/uL — ABNORMAL HIGH (ref 4.22–5.81)
RDW: 17.2 % — ABNORMAL HIGH (ref 11.5–15.5)
WBC: 5.8 10*3/uL (ref 4.0–10.5)
nRBC: 0 % (ref 0.0–0.2)

## 2020-05-03 LAB — MAGNESIUM: Magnesium: 2.4 mg/dL (ref 1.7–2.4)

## 2020-05-03 MED ORDER — SALINE SPRAY 0.65 % NA SOLN
1.0000 | NASAL | Status: DC | PRN
  Filled 2020-05-03: qty 44

## 2020-05-03 MED ORDER — POTASSIUM CHLORIDE CRYS ER 20 MEQ PO TBCR
40.0000 meq | EXTENDED_RELEASE_TABLET | Freq: Once | ORAL | Status: AC
  Administered 2020-05-03: 40 meq via ORAL
  Filled 2020-05-03: qty 2

## 2020-05-03 NOTE — Progress Notes (Signed)
PROGRESS NOTE    Thomas [name]  KYH:[id number] DOB: 23-Dec-1955 DOA: 04/27/2020 PCP: Leonie Douglas, MD   Brief Narrative:  Thomas [name] a 65 y.o.malewith medical history significant forsystolic CHF/ischemic cardiomyopathy, COPD, diabetes mellitus. Patient presented to the ED with complaints of increasing difficulty breathing over the past 2 months, productive cough.Patient was initially admitted with acute hypoxemic respiratory failure that appears multifactorial in the setting of pneumonia and COPD as well as acute on chronic heart failure. Unfortunately, he has had worsening oxygen saturations and dyspnea noted overnight on 1/11 prompting transfer to ICU and placement on BiPAP.Hehas been weaned off BiPAP and is currently on high flow nasal cannula oxygen. He continues to require 4 L nasal cannula oxygen which needs further weaning through today, but still remains around the 80th percentile. Continue diuresis. Pulmonology following.  Assessment & Plan:   Principal Problem:   PNA (pneumonia) Active Problems:   Morbid obesity ([place])   Ischemic cardiomyopathy   Tobacco abuse   DM (diabetes mellitus) ([place])   Acute respiratory failure with hypoxia and hypercapnia (HCC)   Acute on chronic systolic CHF (congestive heart failure) (HCC)   Acute respiratory failure with hypoxia (HCC)   Acute on chronic respiratory failure with hypoxia and hypercapnia (HCC)   Acuteon chronichypoxicand hypercapnicrespiratory failure-improving -Appears to be multifactorial in the setting of worsening CHF as well as COPD with pneumonia -Appears to have obesity hypoventilation syndrome and has failed BiPAP in the past. Plan for trilogy outpatient -Noted bilateral pleural effusions on chest x-ray, that were not duplicated on ultrasound. Thoracentesis avoided. -Continue on Solu-Medrol per pulmonology -Lasix to BID dosing as noted below -Continue on BiPAPat nighttime and high flow nasal  cannula in a.m. -Appreciateongoingpulmonology evaluation -Decreased DuoNeb frequency to every 6 hours due to worsening tachycardia -Trying to obtain home trilogy machine  Acute on chronicdiastolic congestiveheart failure -Echo in 10/21 with LVEF 50%, repeat limited echowith LVEF 55 to 60% on 1/11 -Continue IV Lasix BID with good diuresis ongoing -Currently n.p.o. due to BiPAP needs -Strict input output, daily weights, limit fluid intake   Pneumonia-not septic. Appears to beresolving, chest x-ray today shows right lower lobe pneumonia, butchest CT [facility] at [facility] middle lobe infiltrate on the previous study has nearly resolved with only mild atelectasis remaining.Started outpatient antibiotic course 1/8,with cefuroxime and azithromycin. -DC further abx 1/16 -Follow-upblood cultures with no growth x 5 days  COPD-veryfaint scattered expiratory wheezing on auscultation. -DuoNebs as needed and scheduled. - PRN Mucolytics  Ischemic cardiomyopathy-history ofNSTEMI 2019. Cardiac cath revealed diffuse disease in second marginal branch with 90% stenosis.Medical therapy was recommended with medications to include dual antiplatelets thoughstenting was not performed, Follows with Dr. Conni Elliot. -Resume home medications-aspirin, Plavix, carvedilol, Imdur, atorvastatin, Entresto -Currently cannot take these medications as he is on backup  Diabetes mellitus-hyperglycemia persistent -Obtain hemoglobin A1c8.8% -Hold home Metformin -SSI- R, increased due to hyperglycemia  Tobacco abuse-reports he has cut down his smoking to 2 cigarettes daily.  Morbid obesity BMI 44   DVT prophylaxis:Lovenox Code Status:Full Family Communication:Called spouse on 1/15 at [phone], talked with daughter Thomas [name] on 1/16 Disposition Plan: Status is: Inpatient  Remains inpatient appropriate because:Unsafe d/c plan, IV treatments appropriate due to  intensity of illness or inability to take PO and Inpatient level of care appropriate due to severity of illness   Dispo: The patient is from:Home Anticipated d/c is XT:GGYI Anticipated d/c date is:2-3days Patient currently is not medically stable to d/c.Currently on high flow nasal cannula oxygen. Needs home  trilogy machine arranged. Ongoing diuresis.   Consultants:  Pulmonology  Procedures:  See below  Antimicrobials:  Anti-infectives (From admission, onward)   Start     Dose/Rate Route Frequency Ordered Stop   04/28/20 1700  cefTRIAXone (ROCEPHIN) 2 g in sodium chloride 0.9 % 100 mL IVPB  Status:  Discontinued        2 g 200 mL/hr over 30 Minutes Intravenous Every 24 hours 04/27/20 2140 05/03/20 0906   04/27/20 1730  cefTRIAXone (ROCEPHIN) 1 g in sodium chloride 0.9 % 100 mL IVPB        1 g 200 mL/hr over 30 Minutes Intravenous  Once 04/27/20 1719 04/27/20 2024   04/27/20 1730  azithromycin (ZITHROMAX) 500 mg in sodium chloride 0.9 % 250 mL IVPB  Status:  Discontinued        500 mg 250 mL/hr over 60 Minutes Intravenous Every 24 hours 04/27/20 1719 05/03/20 0906       Subjective: Patient seen and evaluated today with no new acute complaints or concerns. No acute concerns or events noted overnight. He is eager to go home. Diuresed very well overnight. No significant dyspnea or chest pain at rest.  Objective: Vitals:   05/03/20 0600 05/03/20 0700 05/03/20 0724 05/03/20 0819  BP: 136/62 (!) 147/73    Pulse: 79 82  92  Resp: 17 (!) 21  15  Temp:    (!) 97.5 F (36.4 C)  TempSrc:    Oral  SpO2: 90% (!) 89% 92% (!) 89%  Weight:      Height:        Intake/Output Summary (Last 24 hours) at 05/03/2020 1017 Last data filed at 05/03/2020 1010 Gross per 24 hour  Intake 950 ml  Output 5725 ml  Net -4775 ml   Filed Weights   04/29/20 0500 04/30/20 0508 05/01/20 0500  Weight: 134.7 kg 132.3 kg 133.9 kg     Examination:  General exam: Appears calm and comfortable, morbid obesity Respiratory system: Clear to auscultation. Respiratory effort normal. Currently on 4L [place] Cardiovascular system: S1 & S2 heard, RRR.  Gastrointestinal system: Abdomen is soft Central nervous system: Alert and awake Extremities: Bilateral 2+ edema Skin: No significant lesions noted Psychiatry: Flat affect.    Data Reviewed: I have personally reviewed following labs and imaging studies  CBC: Recent Labs  Lab 04/27/20 1109 04/29/20 0500 05/01/20 0516 05/03/20 0500  WBC 6.5 8.0 7.7 5.8  HGB 17.0 17.0 16.1 17.0  HCT 58.8* 61.0* 56.7* 58.9*  MCV 90.2 92.6 89.7 87.5  PLT 211 206 193 578   Basic Metabolic Panel: Recent Labs  Lab 04/28/20 1144 04/29/20 0500 04/30/20 0401 05/01/20 0516 05/03/20 0500  NA 144 143 147* 143 143  K 4.1 3.7 4.1 3.9 3.5  CL 98 93* 94* 91* 87*  CO2 35* 37* 38* 38* 40*  GLUCOSE 111* 111* 171* 160* 121*  BUN 26* 23 38* 45* 36*  CREATININE 0.72 0.76 0.94 0.84 0.74  CALCIUM 8.4* 8.4* 9.0 9.1 8.9  MG  --  2.3 2.4 2.7* 2.4   GFR: Estimated Creatinine Clearance: 128.3 mL/min (by C-G formula based on SCr of 0.74 mg/dL). Liver Function Tests: Recent Labs  Lab 04/27/20 1647  AST 23  ALT 49*  ALKPHOS 141*  BILITOT 0.8  PROT 6.7  ALBUMIN 3.3*   No results for input(s): LIPASE, AMYLASE in the last 168 hours. No results for input(s): AMMONIA in the last 168 hours. Coagulation Profile: No results for input(s): INR, PROTIME in the  last 168 hours. Cardiac Enzymes: No results for input(s): CKTOTAL, CKMB, CKMBINDEX, TROPONINI in the last 168 hours. BNP (last 3 results) No results for input(s): PROBNP in the last 8760 hours. HbA1C: No results for input(s): HGBA1C in the last 72 hours. CBG: Recent Labs  Lab 05/02/20 0824 05/02/20 1125 05/02/20 1617 05/02/20 2122 05/03/20 0821  GLUCAP 223* 178* 250* 204* 204*   Lipid Profile: No results for input(s): CHOL, HDL,  LDLCALC, TRIG, CHOLHDL, LDLDIRECT in the last 72 hours. Thyroid Function Tests: No results for input(s): TSH, T4TOTAL, FREET4, T3FREE, THYROIDAB in the last 72 hours. Anemia Panel: No results for input(s): VITAMINB12, FOLATE, FERRITIN, TIBC, IRON, RETICCTPCT in the last 72 hours. Sepsis Labs: Recent Labs  Lab 04/27/20 1647  LATICACIDVEN 0.8    Recent Results (from the past 240 hour(s))  Blood culture (routine x 2)     Status: None   Collection Time: 04/27/20  4:47 PM   Specimen: Left Antecubital; Blood  Result Value Ref Range Status   Specimen Description LEFT ANTECUBITAL  Final   Special Requests   Final    BOTTLES DRAWN AEROBIC AND ANAEROBIC Blood Culture adequate volume   Culture   Final    NO GROWTH 5 DAYS Performed at [facility], [address] [place], [place] [address]    Report Status 05/02/2020 FINAL  Final  Resp Panel by RT-PCR (Flu A&B, Covid) Nasopharyngeal Swab     Status: None   Collection Time: 04/27/20  5:20 PM   Specimen: Nasopharyngeal Swab; Nasopharyngeal(NP) swabs in vial transport medium  Result Value Ref Range Status   SARS Coronavirus 2 by RT PCR NEGATIVE NEGATIVE Final    Comment: (NOTE) SARS-CoV-2 target nucleic acids are NOT DETECTED.  The SARS-CoV-2 RNA is generally detectable in upper respiratory specimens during the acute phase of infection. The lowest concentration of SARS-CoV-2 viral copies this assay can detect is 138 copies/mL. A negative result does not preclude SARS-Cov-2 infection and should not be used as the sole basis for treatment or other patient management decisions. A negative result may occur with  improper specimen collection/handling, submission of specimen other than nasopharyngeal swab, presence of viral mutation(s) within the areas targeted by this assay, and inadequate number of viral copies(<138 copies/mL). A negative result must be combined with clinical observations, patient history, and epidemiological information.  The expected result is Negative.  Fact Sheet for Patients:  EntrepreneurPulse.com.au  Fact Sheet for Healthcare Providers:  IncredibleEmployment.be  This test is no t yet approved or cleared by the [place] FDA and  has been authorized for detection and/or diagnosis of SARS-CoV-2 by FDA under an Emergency Use Authorization (EUA). This EUA will remain  in effect (meaning this test can be used) for the duration of the COVID-19 declaration under Section 564(b)(1) of the Act, 21 U.S.C.section 360bbb-3(b)(1), unless the authorization is terminated  or revoked sooner.       Influenza A by PCR NEGATIVE NEGATIVE Final   Influenza B by PCR NEGATIVE NEGATIVE Final    Comment: (NOTE) The Xpert Xpress SARS-CoV-2/FLU/RSV plus assay is intended as an aid in the diagnosis of influenza from Nasopharyngeal swab specimens and should not be used as a sole basis for treatment. Nasal washings and aspirates are unacceptable for Xpert Xpress SARS-CoV-2/FLU/RSV testing.  Fact Sheet for Patients: EntrepreneurPulse.com.au  Fact Sheet for Healthcare Providers: IncredibleEmployment.be  This test is not yet approved or cleared by the [place] FDA and has been authorized for detection and/or diagnosis of SARS-CoV-2 by  FDA under an Emergency Use Authorization (EUA). This EUA will remain in effect (meaning this test can be used) for the duration of the COVID-19 declaration under Section 564(b)(1) of the Act, 21 U.S.C. section 360bbb-3(b)(1), unless the authorization is terminated or revoked.  Performed at [facility], [address], [place], [place] [address]   Blood culture (routine x 2)     Status: None   Collection Time: 04/27/20  7:43 PM   Specimen: BLOOD RIGHT ARM  Result Value Ref Range Status   Specimen Description BLOOD RIGHT ARM  Final   Special Requests   Final    BOTTLES DRAWN AEROBIC AND ANAEROBIC Blood  Culture adequate volume   Culture   Final    NO GROWTH 5 DAYS Performed at [facility], [address], [place], [place] [address]    Report Status 05/02/2020 FINAL  Final         Radiology Studies: No results found.      Scheduled Meds: . aspirin EC  81 mg Oral Daily  . atorvastatin  80 mg Oral q1800  . canagliflozin  300 mg Oral QAC breakfast  . Chlorhexidine Gluconate Cloth  6 each Topical Daily  . clopidogrel  75 mg Oral Q breakfast  . enoxaparin (LOVENOX) injection  65 mg Subcutaneous QHS  . furosemide  40 mg Intravenous Q12H  . insulin aspart  0-20 Units Subcutaneous TID WC  . insulin aspart  0-5 Units Subcutaneous QHS  . ipratropium-albuterol  3 mL Nebulization Q6H  . methylPREDNISolone (SOLU-MEDROL) injection  40 mg Intravenous Q12H  . pantoprazole  40 mg Oral Daily    LOS: 6 days    Time spent: 35 minutes    Kaevion Sinclair Darleen Crocker, DO [facility]  If 7PM-7AM, please contact night-coverage [email or web address] 05/03/2020, 10:17 AM

## 2020-05-04 DIAGNOSIS — J9602 Acute respiratory failure with hypercapnia: Secondary | ICD-10-CM | POA: Diagnosis not present

## 2020-05-04 DIAGNOSIS — J9601 Acute respiratory failure with hypoxia: Secondary | ICD-10-CM | POA: Diagnosis not present

## 2020-05-04 LAB — BASIC METABOLIC PANEL
Anion gap: 10 (ref 5–15)
BUN: 28 mg/dL — ABNORMAL HIGH (ref 8–23)
CO2: 43 mmol/L — ABNORMAL HIGH (ref 22–32)
Calcium: 8.7 mg/dL — ABNORMAL LOW (ref 8.9–10.3)
Chloride: 87 mmol/L — ABNORMAL LOW (ref 98–111)
Creatinine, Ser: 0.67 mg/dL (ref 0.61–1.24)
GFR, Estimated: >60 mL/min (ref >60)
Glucose, Bld: 152 mg/dL — ABNORMAL HIGH (ref 70–99)
Potassium: 3.7 mmol/L (ref 3.5–5.1)
Sodium: 140 mmol/L (ref 135–145)

## 2020-05-04 LAB — GLUCOSE, CAPILLARY
Glucose-Capillary: 195 mg/dL — ABNORMAL HIGH (ref 70–99)
Glucose-Capillary: 221 mg/dL — ABNORMAL HIGH (ref 70–99)
Glucose-Capillary: 123 mg/dL — ABNORMAL HIGH (ref 70–99)
Glucose-Capillary: 211 mg/dL — ABNORMAL HIGH (ref 70–99)

## 2020-05-04 LAB — MAGNESIUM: Magnesium: 2.4 mg/dL (ref 1.7–2.4)

## 2020-05-04 MED ORDER — PREDNISONE 20 MG PO TABS
40.0000 mg | ORAL_TABLET | Freq: Every day | ORAL | Status: DC
  Administered 2020-05-05: 40 mg via ORAL
  Filled 2020-05-04: qty 2

## 2020-05-04 MED ORDER — IPRATROPIUM-ALBUTEROL 0.5-2.5 (3) MG/3ML IN SOLN
3.0000 mL | Freq: Three times a day (TID) | RESPIRATORY_TRACT | Status: DC
  Administered 2020-05-04 – 2020-05-05 (×4): 3 mL via RESPIRATORY_TRACT
  Filled 2020-05-04 (×4): qty 3

## 2020-05-04 NOTE — Progress Notes (Signed)
Patient stayed off Bipap last night. He slept on 4 liters with saturation running around 88 to 89.

## 2020-05-04 NOTE — Evaluation (Signed)
Physical Therapy Evaluation Patient Details Name: Thomas [name] MRN: [id number] DOB: [birth year] Today's Date: 05/04/2020   History of Present Illness  Thomas [name] is a 65 y.o. male with medical history significant for systolic CHF/ischemic cardiomyopathy, COPD, diabetes mellitus.  Patient presented to the ED with complaints of increasing difficulty breathing over the past 2 months, productive cough.  He was evaluated by his primary care doctor on Friday, 3 days ago, 1/7, prescribed a course of antibiotics for his pneumonia, azithromycin and cefuroxime and breathing treatments.  Patient started medications next day and today was his 3rd dose.  Reports his symptoms are not worse.  Patient also reports bilateral lower extremity swelling over the past several weeks to months.  He reports compliance with Lasix 40 mg in the morning and 20 mg at night. Patient also reports recent weight gain, reports normal weight within the past week- 282 lb, weight today 300Lbs.    Clinical Impression  Patient seated in chair at beginning of session and is eager to participate with PT. Patient transfers to standing relying on momentum without AD secondary to impaired LE strength. Patient ambulates without AD with slightly wide BOS and intermittently reaching for nearby objects/walls for support. Patient does not have loss of balance. Patient's vitals were monitored throughout session with O2 sat between 88-92% on 4L with HR increasing to 138 BPM while ambulating which decreased to 110-115 BPM immediately following return to sitting. Patient very eager to to be discharged and return home with his family. Patient will benefit from continued physical therapy in hospital and recommended venue below to increase strength, balance, endurance for safe ADLs and gait.     Follow Up Recommendations No PT follow up    Equipment Recommendations  None recommended by PT    Recommendations for Other Services       Precautions /  Restrictions Precautions Precautions: Fall Restrictions Weight Bearing Restrictions: No      Mobility  Bed Mobility               General bed mobility comments: Patient seated in chair at bedside at beginning of session    Transfers Overall transfer level: Needs assistance Equipment used: None Transfers: Sit to/from Stand;Stand Pivot Transfers Sit to Stand: Supervision Stand pivot transfers: Supervision       General transfer comment: without AD, uses momentum to transfer from sit to stand, decreased eccentric control from stand to sit  Ambulation/Gait Ambulation/Gait assistance: Modified independent (Device/Increase time) Gait Distance (Feet): 100 Feet Assistive device: None Gait Pattern/deviations: Decreased step length - right;Decreased step length - left;Wide base of support;Decreased stride length Gait velocity: decreased   General Gait Details: slightly slow cadence without AD on 4L 02 intermittent reaching for nearby objects/walls for support  Stairs            Wheelchair Mobility    Modified Rankin (Stroke Patients Only)       Balance Overall balance assessment: Needs assistance Sitting-balance support: Feet supported Sitting balance-Leahy Scale: Normal Sitting balance - Comments: seated edge of chair   Standing balance support: No upper extremity supported;During functional activity Standing balance-Leahy Scale: Good Standing balance comment: good/ fair without AD                             Pertinent Vitals/Pain Pain Assessment: No/denies pain    Home Living Family/patient expects to be discharged to:: Private residence Living Arrangements: Spouse/significant other;Children Available Help at  Discharge: Family Type of Home: House Home Access: Stairs to enter Entrance Stairs-Rails: None Entrance Stairs-Number of Steps: 3 Home Layout: One level;Laundry or work area in [place]: Environmental consultant - 2 wheels;Cane -  single point      Prior Function Level of Independence: Independent         Comments: Patient states short distance community ambulator without AD, independent with basic ADL     Hand Dominance        Extremity/Trunk Assessment   Upper Extremity Assessment Upper Extremity Assessment: Overall WFL for tasks assessed    Lower Extremity Assessment Lower Extremity Assessment: Overall WFL for tasks assessed    Cervical / Trunk Assessment Cervical / Trunk Assessment: Normal  Communication   Communication: No difficulties  Cognition Arousal/Alertness: Awake/alert Behavior During Therapy: WFL for tasks assessed/performed Overall Cognitive Status: Within Functional Limits for tasks assessed                                        General Comments      Exercises     Assessment/Plan    PT Assessment Patient needs continued PT services  PT Problem List Decreased mobility;Decreased activity tolerance;Decreased balance;Cardiopulmonary status limiting activity       PT Treatment Interventions DME instruction;Therapeutic exercise;Gait training;Balance training;Stair training;Neuromuscular re-education;Functional mobility training;Therapeutic activities;Patient/family education    PT Goals (Current goals can be found in the Care Plan section)  Acute Rehab PT Goals Patient Stated Goal: Return home with family PT Goal Formulation: With patient Time For Goal Achievement: 05/18/20 Potential to Achieve Goals: Good    Frequency Min 3X/week   Barriers to discharge        Co-evaluation               AM-PAC PT "6 Clicks" Mobility  Outcome Measure Help needed turning from your back to your side while in a flat bed without using bedrails?: None Help needed moving from lying on your back to sitting on the side of a flat bed without using bedrails?: A Little Help needed moving to and from a bed to a chair (including a wheelchair)?: None Help needed  standing up from a chair using your arms (e.g., wheelchair or bedside chair)?: None Help needed to walk in hospital room?: A Little Help needed climbing 3-5 steps with a railing? : A Little 6 Click Score: 21    End of Session Equipment Utilized During Treatment: Oxygen Activity Tolerance: Patient tolerated treatment well Patient left: in chair;with call bell/phone within reach Nurse Communication: Mobility status PT Visit Diagnosis: Unsteadiness on feet (R26.81);Other abnormalities of gait and mobility (R26.89);Muscle weakness (generalized) (M62.81)    Time: 6389-3734 PT Time Calculation (min) (ACUTE ONLY): 22 min   Charges:   PT Evaluation $PT Eval Moderate Complexity: 1 Mod PT Treatments $Therapeutic Activity: 8-22 mins        1:52 PM, 05/04/20 Mearl Latin PT, DPT [facility]

## 2020-05-04 NOTE — Plan of Care (Signed)
  Problem: Acute Rehab PT Goals(only PT should resolve) Goal: Patient Will Transfer Sit To/From Stand Outcome: Progressing Flowsheets (Taken 05/04/2020 1353) Patient will transfer sit to/from stand: Independently Goal: Pt Will Ambulate Outcome: Progressing Flowsheets (Taken 05/04/2020 1353) Pt will Ambulate:  > 125 feet  with modified independence Goal: Pt/caregiver will Perform Home Exercise Program Outcome: Progressing Flowsheets (Taken 05/04/2020 1353) Pt/caregiver will Perform Home Exercise Program:  For increased strengthening  For improved balance  Independently  1:54 PM, 05/04/20 Mearl Latin PT, DPT Physical Therapist at [facility]

## 2020-05-04 NOTE — Progress Notes (Signed)
NAME:  Thomas [name], MRN:  [id number], DOB:  04-19-1955, LOS: 7 ADMISSION DATE:  04/27/2020, CONSULTATION DATE:  1/11 REFERRING MD:  Sh[facility]  CHIEF COMPLAINT:  AMS/ C02 narcosis   Brief History:  5 yowm smoker from [place] with MO complicated by CHF/IHD and DM dx with progressive sob x 2 months and ? pna dx 1/9 [facility] in [place] but left AMA and admitted to [facility] 1/10 with severe hypoxemia/ hypercarbia improved on bipap and PCCM consulted am 1/11  History of Present Illness:  65 y.o. MO male with medical history significant for systolic CHF/ischemic cardiomyopathy, COPD, diabetes mellitus. Patient presented to the ED with complaints of increasing difficulty breathing over the past 2 months, productive cough.  He was evaluated by his primary care doctor on  1/7, prescribed a course of antibiotics for his pneumonia, azithromycin and cefuroxime and breathing treatments.  Patient started medications 1/8 and 1/10 was his 3rd dose.  Reports his symptoms are not worse.  Patient also reports bilateral lower extremity swelling over the past several weeks to months.  He reports compliance with Lasix 40 mg in the morning and 20 mg at night. Patient also reports recent weight gain, reports show  weight within the past week- 282 lb, weight  1/0 300Lbs.   Vaccinated for COVID x 3.  Details from care everywhere- Patient presented to [facility] 1/9 he had a CT scan of his chest with contrast which showed the infiltrate in the anterior right lower lobe along the fissure previously is more bandlike today and could represent chronic  atelectasis, scarring, or less likely recurrence of infiltrate.  There is no identified central mass to explain this bandlike opacity. Recommend attention on follow-up. The right middle lobe infiltrate on the previous study has nearly  resolved with only mild atelectasis remaining right anterior lower lobe pneumonia, with moderate right pleural effusion and associated  atelectasis.    Admission was offered for CHF decompensation and pneumonia, but patient left AMA.  ED Course: O2 sats 77 %, on room air.  On 4 L.  Sats 89 to 92%.  Temperature 97.9.  WBC 6.5.  Portable chest x-ray shows right lower lobe infiltrate and effusion consistent with pneumonia. Ceftriaxone and azithromycin started in the ED.  Hospitalist to admit for further evaluation and management.  Past Medical History:  CHF (congestive heart failure)  Coronary artery disease, Morbid obesity ([place]), NSTEMI (non-ST elevated myocardial infarction) ([place]), COPD (chronic obstructive pulmonary disease)  Pneumonia Snoring Type 2 diabetes mellitus ([place]).   Significant Hospital Events:  Added solumedrol 1/12 for airways dz  1/14 on 8 L with saturation 86%  Consults:  PCCM   Procedures:     Significant Diagnostic Tests:  U/S bilateral chest 1/11 > not enough to tap Echo 1/11 1. Left ventricular ejection fraction, by estimation, is 55 to 60%. The  left ventricle has normal function. The left ventricle has no regional  wall motion abnormalities. Left ventricular diastolic parameters were  normal.  2. Right ventricular systolic function is normal. The right ventricular  size is normal.  3. The mitral valve is normal in structure. No evidence of mitral valve  regurgitation. No evidence of mitral stenosis.  4. The aortic valve was not well visualized.  5. The inferior vena cava is normal in size with greater than 50%  respiratory variability, suggesting right atrial pressure of 3 mmHg.   CT chest with contrast/[facility] 04/26/2020 There is a new nodule in the left lower  lobe measuring 11 x a by 8 mm with a mean size of 9 mm. There is a 6 mm nodule in the left upper lobe    Micro Data:  Resp viral Panel PCR  1/10>> neg covid/ flu BC x 2  1/10 >>>ng Urine Strep  1/11 > neg  Urine Legionella 1/11 >  neg  Antimicrobials:  Zmax  1/10 Rocephin 1/10   Scheduled Meds: . aspirin EC  81 mg  Oral Daily  . atorvastatin  80 mg Oral q1800  . canagliflozin  300 mg Oral QAC breakfast  . Chlorhexidine Gluconate Cloth  6 each Topical Daily  . clopidogrel  75 mg Oral Q breakfast  . enoxaparin (LOVENOX) injection  65 mg Subcutaneous QHS  . furosemide  40 mg Intravenous Q12H  . insulin aspart  0-20 Units Subcutaneous TID WC  . insulin aspart  0-5 Units Subcutaneous QHS  . ipratropium-albuterol  3 mL Nebulization TID  . methylPREDNISolone (SOLU-MEDROL) injection  40 mg Intravenous Q12H  . pantoprazole  40 mg Oral Daily   Continuous Infusions:  PRN Meds:.acetaminophen **OR** acetaminophen, albuterol, guaiFENesin-dextromethorphan, ondansetron **OR** ondansetron (ZOFRAN) IV, polyethylene glycol, sodium chloride   Interim History / Subjective:   No chest pain, dyspnea Diuresed well with lasix last 3 days , net neg 15L On 5L [place] DId not use biPAP last inght 'they did not place this on me'  Objective   Blood pressure (!) 151/83, pulse (!) 106, temperature 98.1 F (36.7 C), temperature source Oral, resp. rate 17, height 5\' 9"  (1.753 m), weight 133.9 kg, SpO2 92 %.        Intake/Output Summary (Last 24 hours) at 05/04/2020 1551 Last data filed at 05/04/2020 1200 Gross per 24 hour  Intake 1920 ml  Output 4625 ml  Net -2705 ml   Filed Weights   04/29/20 0500 04/30/20 0508 05/01/20 0500  Weight: 134.7 kg 132.3 kg 133.9 kg    Examination:      Gen. Pleasant, obese, in no distress, normal affect, on 5L [place] [facility] - no pallor,icterus, no thrushclass 2 airway Neck: No JVD, no thyromegaly, no carotid bruits Lungs:  Decreased BL, no rhonchi, no accessory muscle use  Cardiovascular: s1s2 RR no murmurs or gallops, 1+ peripheral edema Abdomen: soft and non-tender, no hepatosplenomegaly, BS normal. Musculoskeletal: No deformities, no cyanosis or clubbing Neuro:  alert, non focal, no tremors   Chest x-ray 1/13 shows improved aeration on left Labs show normal electrolytes,  1/16 no  leukocytosis, mild polycythemia    Resolved Hospital Problem list      Assessment & Plan:  1) MO complicated by OHS/ hypoxemic and hypercarbic resp failure aute on chronic  and severe restrictive > obst physiology  -   sleeps in recliner at home -He has known OSA (no sleep study available ) has CPAP but not used for a long time -Ideally needs noct BiPAP or NIV on discharge, case manager to see if insurance/DME will allow . Needs BiPAP EVERY NIGHT during hosp stay -O2 requirements down    2) left atelectasis that developed after admission , now resolved though hard to exclude chf/ pna  - no def pleural effusions. >>> IS/ up in chair as much as possible  >>>  completed ceftriaxone & azithro >>> added steroids 1/12 for airways dz , change to oral pred40, taper over 2 weeks  3) polycythemia, mild, due to  #1 (not primary likely) worse with diuresis   Lab Results  Component Value Date  HGB 17.0 05/03/2020   HGB 16.1 05/01/2020   HGB 17.0 04/29/2020    >>>  lovenox for proph  4) carries dx of  chf but bnp / echo not impressive so ? Why on entresto ? Pulmonary hypertension on CT but echo does not correlate - leave off entresto  -Resume Lasix 40 daily, 5 L negative so far  5) Tobacco abuse - nicotine patches gave him bad dreams, could not take gum due to bad teeth, please give Rx for nicotrol inhaler on discharge  PCCM available as needed, outpt FU with dr wert   Labs   CBC: Recent Labs  Lab 04/29/20 0500 05/01/20 0516 05/03/20 0500  WBC 8.0 7.7 5.8  HGB 17.0 16.1 17.0  HCT 61.0* 56.7* 58.9*  MCV 92.6 89.7 87.5  PLT 206 193 937    Basic Metabolic Panel: Recent Labs  Lab 04/29/20 0500 04/30/20 0401 05/01/20 0516 05/03/20 0500 05/04/20 0430  NA 143 147* 143 143 140  K 3.7 4.1 3.9 3.5 3.7  CL 93* 94* 91* 87* 87*  CO2 37* 38* 38* 40* 43*  GLUCOSE 111* 171* 160* 121* 152*  BUN 23 38* 45* 36* 28*  CREATININE 0.76 0.94 0.84 0.74 0.67  CALCIUM 8.4* 9.0 9.1 8.9  8.7*  MG 2.3 2.4 2.7* 2.4 2.4   GFR: Estimated Creatinine Clearance: 128.3 mL/min (by C-G formula based on SCr of 0.67 mg/dL). Recent Labs  Lab 04/27/20 1647 04/29/20 0500 05/01/20 0516 05/03/20 0500  WBC  --  8.0 7.7 5.8  LATICACIDVEN 0.8  --   --   --     Liver Function Tests: Recent Labs  Lab 04/27/20 1647  AST 23  ALT 49*  ALKPHOS 141*  BILITOT 0.8  PROT 6.7  ALBUMIN 3.3*   No results for input(s): LIPASE, AMYLASE in the last 168 hours. No results for input(s): AMMONIA in the last 168 hours.  ABG    Component Value Date/Time   PHART 7.246 (L) 04/28/2020 0615   PCO2ART 90.9 (HH) 04/28/2020 0615   PO2ART 81.9 (L) 04/28/2020 0615   HCO3 31.1 (H) 04/28/2020 1255   O2SAT 80.2 04/28/2020 1255     Kara Mead MD. FCCP. [facility] See Amion for pager  If no response to pager , please call [phone]  After 7:00 pm call Elink  [phone]   05/04/2020

## 2020-05-04 NOTE — Progress Notes (Signed)
PROGRESS NOTE    Thomas [name]  YSA:[id number] DOB: Aug 21, 1955 DOA: 04/27/2020 PCP: Thomas Douglas, MD   Brief Narrative:  Thomas [name] a 65 y.o.malewith medical history significant forsystolic CHF/ischemic cardiomyopathy, COPD, diabetes mellitus. Patient presented to the ED with complaints of increasing difficulty breathing over the past 2 months, productive cough.Patient was initially admitted with acute hypoxemic respiratory failure that appears multifactorial in the setting of pneumonia and COPD as well as acute on chronic heart failure. Unfortunately, he has had worsening oxygen saturations and dyspnea noted overnight on 1/11 prompting transfer to ICU and placement on BiPAP.Hehas been weaned off BiPAP and is currently on high flow nasal cannula oxygen. He continues to require4L nasal cannula oxygen which needs further weaning through today, but still remains around the 80th percentile. Continue diuresis.Pulmonology following.  -Patient continues to have significant amounts of diuresis noted with over 3 L in the last 24 hours and total of 17 L this admission.   Assessment & Plan:   Principal Problem:   PNA (pneumonia) Active Problems:   Morbid obesity ([place])   Ischemic cardiomyopathy   Tobacco abuse   DM (diabetes mellitus) ([place])   Acute respiratory failure with hypoxia and hypercapnia (HCC)   Acute on chronic systolic CHF (congestive heart failure) (HCC)   Acute respiratory failure with hypoxia (HCC)   Acute on chronic respiratory failure with hypoxia and hypercapnia (HCC)   Acuteon chronichypoxicand hypercapnicrespiratory failure-slowly improving -Appears to be multifactorial in the setting of worsening CHF as well as COPD with pneumonia -Appears to have obesity hypoventilation syndrome and has failed BiPAP in the past. Plan for trilogy outpatient -Noted bilateral pleural effusions on chest x-ray, that were not duplicated on ultrasound. Thoracentesis  avoided. -Continueon Solu-Medrol per pulmonology -Lasix to BID dosing as noted below to continue with regular diuresis over the last 24 hours in total 17 L thus far. -Continue on BiPAPat nighttime and high flow nasal cannula in a.m. -Appreciateongoingpulmonology evaluation -Decreased DuoNeb frequency to every 6 hours due to worsening tachycardia -Trying to obtain home trilogy machine  Acute on chronicdiastolic congestiveheart failure -Echo in 10/21 with LVEF 50%, repeat limited echowith LVEF 55 to 60% on 1/11 -Continue IV Lasix BID with good diuresis ongoing as noted above -Currently n.p.o. due to BiPAP needs -Strict input output, daily weights, limit fluid intake   Pneumonia-not septic. Appears to beresolving, chest x-ray today shows right lower lobe pneumonia, [facility] middle lobe infiltrate on the previous study has nearly resolved with only mild atelectasis remaining.Started outpatient antibiotic course 1/8,with cefuroxime and azithromycin. -Antibiotics discontinued 1/16 -Follow-upblood cultures with no growth x 5 days  COPD-veryfaint scattered expiratory wheezing on auscultation. -DuoNebs as needed and scheduled. - PRN Mucolytics  Ischemic cardiomyopathy-history ofNSTEMI 2019. Cardiac cath revealed diffuse disease in second marginal branch with 90% stenosis.Medical therapy was recommended with medications to include dual antiplatelets thoughstenting was not performed, Follows with Dr. Conni [name]. -Resume home medications-aspirin, Plavix, carvedilol, Imdur, atorvastatin, Entresto -Currently cannot take these medications as he is on backup  Diabetes mellitus-hyperglycemiapersistent -Obtain hemoglobin A1c8.8% -Hold home Metformin -SSI-R,increased due to hyperglycemia  Tobacco abuse-reports he has cut down his smoking to 2 cigarettes daily.  Morbid obesity BMI 44   DVT prophylaxis:Lovenox Code  Status:Full Family Communication:Called spouse on 1/15at [phone], talked with daughter Thomas [name] on 1/17 Disposition Plan: Status is: Inpatient  Remains inpatient appropriate because:Unsafe d/c plan, IV treatments appropriate due to intensity of illness or inability to take PO and Inpatient level of  care appropriate due to severity of illness   Dispo: The patient is from:Home Anticipated d/c is OE:[facility] Anticipated d/c date is:2-3days Patient currently is not medically stable to d/c.Currently on high flow nasal cannula oxygen. Needs home trilogy machinearranged. Ongoing diuresis.   Consultants:  Pulmonology  Procedures:  See below  Nutritional Assessment:  The patient's BMI is: Body mass index is 43.59 kg/m.Marland Kitchen  Antimicrobials:  Anti-infectives (From admission, onward)   Start     Dose/Rate Route Frequency Ordered Stop   04/28/20 1700  cefTRIAXone (ROCEPHIN) 2 g in sodium chloride 0.9 % 100 mL IVPB  Status:  Discontinued        2 g 200 mL/hr over 30 Minutes Intravenous Every 24 hours 04/27/20 2140 05/03/20 0906   04/27/20 1730  cefTRIAXone (ROCEPHIN) 1 g in sodium chloride 0.9 % 100 mL IVPB        1 g 200 mL/hr over 30 Minutes Intravenous  Once 04/27/20 1719 04/27/20 2024   04/27/20 1730  azithromycin (ZITHROMAX) 500 mg in sodium chloride 0.9 % 250 mL IVPB  Status:  Discontinued        500 mg 250 mL/hr over 60 Minutes Intravenous Every 24 hours 04/27/20 1719 05/03/20 0906       Subjective: Patient seen and evaluated today with no new acute complaints or concerns. No acute concerns or events noted overnight.  He continues to remain very eager for discharge and denies any shortness of breath or chest pain.  He continues to diurese a significant amount of volume with slow improvement in respiratory status daily.  Objective: Vitals:   05/04/20 0500 05/04/20 0700 05/04/20 0800 05/04/20 0804  BP: 139/83 (!) 147/75  (!) 160/84   Pulse: 83 93 (!) 106   Resp: 18 18 16    Temp: 98.8 F (37.1 C)   98.9 F (37.2 C)  TempSrc: Oral   Oral  SpO2: (!) 88% (!) 88% (!) 88% (!) 88%  Weight:      Height:        Intake/Output Summary (Last 24 hours) at 05/04/2020 0903 Last data filed at 05/04/2020 0800 Gross per 24 hour  Intake 1320 ml  Output 4525 ml  Net -3205 ml   Filed Weights   04/29/20 0500 04/30/20 0508 05/01/20 0500  Weight: 134.7 kg 132.3 kg 133.9 kg    Examination:  General exam: Appears calm and comfortable, morbidly obese Respiratory system: Diminished bilaterally. Respiratory effort normal.  Currently on 4 L nasal cannula oxygen Cardiovascular system: S1 & S2 heard, RRR.  Gastrointestinal system: Abdomen is soft, obese Central nervous system: Alert and awake Extremities: Persistent, bilateral tense edema Skin: No significant lesions noted Psychiatry: Flat affect.    Data Reviewed: I have personally reviewed following labs and imaging studies  CBC: Recent Labs  Lab 04/27/20 1109 04/29/20 0500 05/01/20 0516 05/03/20 0500  WBC 6.5 8.0 7.7 5.8  HGB 17.0 17.0 16.1 17.0  HCT 58.8* 61.0* 56.7* 58.9*  MCV 90.2 92.6 89.7 87.5  PLT 211 206 193 361   Basic Metabolic Panel: Recent Labs  Lab 04/29/20 0500 04/30/20 0401 05/01/20 0516 05/03/20 0500 05/04/20 0430  NA 143 147* 143 143 140  K 3.7 4.1 3.9 3.5 3.7  CL 93* 94* 91* 87* 87*  CO2 37* 38* 38* 40* 43*  GLUCOSE 111* 171* 160* 121* 152*  BUN 23 38* 45* 36* 28*  CREATININE 0.76 0.94 0.84 0.74 0.67  CALCIUM 8.4* 9.0 9.1 8.9 8.7*  MG 2.3 2.4 2.7* 2.4 2.4  GFR: Estimated Creatinine Clearance: 128.3 mL/min (by C-G formula based on SCr of 0.67 mg/dL). Liver Function Tests: Recent Labs  Lab 04/27/20 1647  AST 23  ALT 49*  ALKPHOS 141*  BILITOT 0.8  PROT 6.7  ALBUMIN 3.3*   No results for input(s): LIPASE, AMYLASE in the last 168 hours. No results for input(s): AMMONIA in the last 168 hours. Coagulation Profile: No  results for input(s): INR, PROTIME in the last 168 hours. Cardiac Enzymes: No results for input(s): CKTOTAL, CKMB, CKMBINDEX, TROPONINI in the last 168 hours. BNP (last 3 results) No results for input(s): PROBNP in the last 8760 hours. HbA1C: No results for input(s): HGBA1C in the last 72 hours. CBG: Recent Labs  Lab 05/03/20 0821 05/03/20 1146 05/03/20 1552 05/03/20 2014 05/04/20 0737  GLUCAP 204* 263* 211* 224* 195*   Lipid Profile: No results for input(s): CHOL, HDL, LDLCALC, TRIG, CHOLHDL, LDLDIRECT in the last 72 hours. Thyroid Function Tests: No results for input(s): TSH, T4TOTAL, FREET4, T3FREE, THYROIDAB in the last 72 hours. Anemia Panel: No results for input(s): VITAMINB12, FOLATE, FERRITIN, TIBC, IRON, RETICCTPCT in the last 72 hours. Sepsis Labs: Recent Labs  Lab 04/27/20 1647  LATICACIDVEN 0.8    Recent Results (from the past 240 hour(s))  Blood culture (routine x 2)     Status: None   Collection Time: 04/27/20  4:47 PM   Specimen: Left Antecubital; Blood  Result Value Ref Range Status   Specimen Description LEFT ANTECUBITAL  Final   Special Requests   Final    BOTTLES DRAWN AEROBIC AND ANAEROBIC Blood Culture adequate volume   Culture   Final    NO GROWTH 5 DAYS Performed at [facility], [address] [place], [place] [address]    Report Status 05/02/2020 FINAL  Final  Resp Panel by RT-PCR (Flu A&B, Covid) Nasopharyngeal Swab     Status: None   Collection Time: 04/27/20  5:20 PM   Specimen: Nasopharyngeal Swab; Nasopharyngeal(NP) swabs in vial transport medium  Result Value Ref Range Status   SARS Coronavirus 2 by RT PCR NEGATIVE NEGATIVE Final    Comment: (NOTE) SARS-CoV-2 target nucleic acids are NOT DETECTED.  The SARS-CoV-2 RNA is generally detectable in upper respiratory specimens during the acute phase of infection. The lowest concentration of SARS-CoV-2 viral copies this assay can detect is 138 copies/mL. A negative result does not preclude  SARS-Cov-2 infection and should not be used as the sole basis for treatment or other patient management decisions. A negative result may occur with  improper specimen collection/handling, submission of specimen other than nasopharyngeal swab, presence of viral mutation(s) within the areas targeted by this assay, and inadequate number of viral copies(<138 copies/mL). A negative result must be combined with clinical observations, patient history, and epidemiological information. The expected result is Negative.  Fact Sheet for Patients:  EntrepreneurPulse.com.au  Fact Sheet for Healthcare Providers:  IncredibleEmployment.be  This test is no t yet approved or cleared by the [place] FDA and  has been authorized for detection and/or diagnosis of SARS-CoV-2 by FDA under an Emergency Use Authorization (EUA). This EUA will remain  in effect (meaning this test can be used) for the duration of the COVID-19 declaration under Section 564(b)(1) of the Act, 21 U.S.C.section 360bbb-3(b)(1), unless the authorization is terminated  or revoked sooner.       Influenza A by PCR NEGATIVE NEGATIVE Final   Influenza B by PCR NEGATIVE NEGATIVE Final    Comment: (NOTE) The Xpert Xpress SARS-CoV-2/FLU/RSV plus  assay is intended as an aid in the diagnosis of influenza from Nasopharyngeal swab specimens and should not be used as a sole basis for treatment. Nasal washings and aspirates are unacceptable for Xpert Xpress SARS-CoV-2/FLU/RSV testing.  Fact Sheet for Patients: EntrepreneurPulse.com.au  Fact Sheet for Healthcare Providers: IncredibleEmployment.be  This test is not yet approved or cleared by the [place] FDA and has been authorized for detection and/or diagnosis of SARS-CoV-2 by FDA under an Emergency Use Authorization (EUA). This EUA will remain in effect (meaning this test can be used) for the duration of  the COVID-19 declaration under Section 564(b)(1) of the Act, 21 U.S.C. section 360bbb-3(b)(1), unless the authorization is terminated or revoked.  Performed at [facility], [address], [place], [place] [address]   Blood culture (routine x 2)     Status: None   Collection Time: 04/27/20  7:43 PM   Specimen: BLOOD RIGHT ARM  Result Value Ref Range Status   Specimen Description BLOOD RIGHT ARM  Final   Special Requests   Final    BOTTLES DRAWN AEROBIC AND ANAEROBIC Blood Culture adequate volume   Culture   Final    NO GROWTH 5 DAYS Performed at [facility], [address] [place], [place] [address]    Report Status 05/02/2020 FINAL  Final         Radiology Studies: No results found.      Scheduled Meds: . aspirin EC  81 mg Oral Daily  . atorvastatin  80 mg Oral q1800  . canagliflozin  300 mg Oral QAC breakfast  . Chlorhexidine Gluconate Cloth  6 each Topical Daily  . clopidogrel  75 mg Oral Q breakfast  . enoxaparin (LOVENOX) injection  65 mg Subcutaneous QHS  . furosemide  40 mg Intravenous Q12H  . insulin aspart  0-20 Units Subcutaneous TID WC  . insulin aspart  0-5 Units Subcutaneous QHS  . ipratropium-albuterol  3 mL Nebulization TID  . methylPREDNISolone (SOLU-MEDROL) injection  40 mg Intravenous Q12H  . pantoprazole  40 mg Oral Daily    LOS: 7 days    Time spent: 35 minutes    Deyjah Kindel Darleen Crocker, DO [facility]  If 7PM-7AM, please contact night-coverage [email or web address] 05/04/2020, 9:03 AM

## 2020-05-05 LAB — BASIC METABOLIC PANEL
Anion gap: 14 (ref 5–15)
BUN: 29 mg/dL — ABNORMAL HIGH (ref 8–23)
CO2: 40 mmol/L — ABNORMAL HIGH (ref 22–32)
Calcium: 9.1 mg/dL (ref 8.9–10.3)
Chloride: 87 mmol/L — ABNORMAL LOW (ref 98–111)
Creatinine, Ser: 0.82 mg/dL (ref 0.61–1.24)
GFR, Estimated: >60 mL/min (ref >60)
Glucose, Bld: 133 mg/dL — ABNORMAL HIGH (ref 70–99)
Potassium: 3.6 mmol/L (ref 3.5–5.1)
Sodium: 141 mmol/L (ref 135–145)

## 2020-05-05 LAB — GLUCOSE, CAPILLARY
Glucose-Capillary: 140 mg/dL — ABNORMAL HIGH (ref 70–99)
Glucose-Capillary: 276 mg/dL — ABNORMAL HIGH (ref 70–99)

## 2020-05-05 LAB — MAGNESIUM: Magnesium: 2.4 mg/dL (ref 1.7–2.4)

## 2020-05-05 MED ORDER — COMBIVENT RESPIMAT 20-100 MCG/ACT IN AERS: 1.0000 | INHALATION_SPRAY | Freq: Four times a day (QID) | RESPIRATORY_TRACT | 2 refills | Status: DC | PRN

## 2020-05-05 MED ORDER — POTASSIUM CHLORIDE ER 10 MEQ PO TBCR: 10.0000 meq | EXTENDED_RELEASE_TABLET | Freq: Two times a day (BID) | ORAL | 1 refills | Status: DC

## 2020-05-05 MED ORDER — FUROSEMIDE 40 MG PO TABS: 40.0000 mg | ORAL_TABLET | Freq: Two times a day (BID) | ORAL | 1 refills | Status: DC

## 2020-05-05 MED ORDER — PREDNISONE 10 MG PO TABS: ORAL_TABLET | ORAL | 0 refills | Status: AC

## 2020-05-05 NOTE — Discharge Summary (Signed)
Physician Discharge Summary  Thomas [name] SEG:[id number] DOB: [birth year] DOA: 04/27/2020  PCP: Leonie Douglas, MD  Admit date: 04/27/2020  Discharge date: 05/05/2020  Admitted From:Home  Disposition:  Home  Recommendations for Outpatient Follow-up:  1. Follow up with PCP in 1-2 weeks 2. Follow-up BMP and CBC in 1 week 3. Follow-up with pulmonology outpatient in 2 weeks for outpatient PFTs and set up for sleep study 4. Continue on home oxygen as prescribed 4 L 24/7 5. Prednisone taper over 2 weeks as prescribed 6. Continue now on increased dose of Lasix 40 mg twice daily 7. Continue other home medications as prior  Home Health: None  Equipment/Devices: Home oxygen 4 L nasal cannula  Discharge Condition:Stable  CODE STATUS: Full  Diet recommendation: Heart Healthy/carb modified-fluid restriction 2 L  Brief/Interim Summary: Thomas [name] a 65 y.o.malewith medical history significant forsystolic CHF/ischemic cardiomyopathy, COPD, diabetes mellitus. Patient presented to the ED with complaints of increasing difficulty breathing over the past 2 months, productive cough.Patient was initially admitted with acute hypoxemic respiratory failure that appears multifactorial in the setting of pneumonia and COPD as well as acute on chronic heart failure. Unfortunately, he has had worsening oxygen saturations and dyspnea noted overnight on 1/11 prompting transfer to ICU and placement on BiPAP.Hehas been weaned off BiPAP and is currently requiring 4 L nasal cannula oxygen. Patient was treated for COPD as well as acute on chronic diastolic congestive heart failure exacerbation and has had excellent diuresis during this admission of near 20L-this is course, took several days. He is down to a weight of 269 pounds and his usual baseline at home is around 280. His renal function has remained stable and he has completed his antibiotic course for pneumonia. He is in stable condition for  discharge and is quite eager for discharge over the last several days. He has had PT evaluation with no home recommendations. He will require 4 L home nasal cannula oxygen which will be arranged. His home Lasix dose will be increased to 40 mg twice daily from 20 mg twice daily. His daughter who stays with him is a nurse will ensure that he follows his appropriate diet and weighs himself daily. Pulmonology to follow-up with him outpatient to consider trilogy machine.  Discharge Diagnoses:  Principal Problem:   PNA (pneumonia) Active Problems:   Morbid obesity ([place])   Ischemic cardiomyopathy   Tobacco abuse   DM (diabetes mellitus) ([place])   Acute respiratory failure with hypoxia and hypercapnia (HCC)   Acute on chronic systolic CHF (congestive heart failure) (HCC)   Acute respiratory failure with hypoxia (HCC)   Acute on chronic respiratory failure with hypoxia and hypercapnia (HCC)  Principal discharge diagnosis: Acute on chronic hypoxemic and hypercapnic respiratory failure-multifactorial in setting of acute on chronic diastolic CHF exacerbation as well as COPD exacerbation with pneumonia.  Discharge Instructions  Discharge Instructions    Ambulatory referral to Pulmonology   Complete by: As directed    [facility], needs PFT and sleep study outpatient.   Reason for referral: Asthma/COPD   Diet - low sodium heart healthy   Complete by: As directed    Increase activity slowly   Complete by: As directed      Allergies as of 05/05/2020      Reactions   Glipizide Palpitations   Codeine Nausea And Vomiting   Doxycycline       Medication List    STOP taking these medications   azithromycin 500 MG tablet Commonly known as: VPXTGGYIR  cefUROXime 250 MG tablet Commonly known as: CEFTIN   Entresto 24-26 MG Generic drug: sacubitril-valsartan     TAKE these medications   aspirin EC 81 MG tablet Take 81 mg by mouth daily.   atorvastatin 80 MG tablet Commonly known as: LIPITOR TAKE 1  TABLET (80 MG TOTAL) BY MOUTH DAILY AT 6 PM. What changed: when to take this   canagliflozin 300 MG Tabs tablet Commonly known as: INVOKANA Take 300 mg by mouth daily before breakfast.   carvedilol 12.5 MG tablet Commonly known as: COREG TAKE 1 TABLET (12.5 MG TOTAL) BY MOUTH 2 (TWO) TIMES DAILY.   clopidogrel 75 MG tablet Commonly known as: PLAVIX TAKE 1 TABLET (75 MG TOTAL) BY MOUTH DAILY WITH BREAKFAST.   Combivent Respimat 20-100 MCG/ACT Aers respimat Generic drug: Ipratropium-Albuterol Inhale 1 puff into the lungs every 6 (six) hours as needed for wheezing or shortness of breath.   furosemide 40 MG tablet Commonly known as: LASIX Take 1 tablet (40 mg total) by mouth 2 (two) times daily. What changed:   medication strength  how much to take   ibuprofen 200 MG tablet Commonly known as: ADVIL Take 200 mg by mouth every 6 (six) hours as needed for fever or mild pain.   isosorbide mononitrate 60 MG 24 hr tablet Commonly known as: IMDUR Take 1 tablet (60 mg total) by mouth daily.   metFORMIN 500 MG 24 hr tablet Commonly known as: GLUCOPHAGE-XR Take 1,000 mg by mouth 2 (two) times daily.   nitroGLYCERIN 0.4 MG SL tablet Commonly known as: NITROSTAT Place 1 tablet (0.4 mg total) under the tongue every 5 (five) minutes x 3 doses as needed for chest pain (if no relief after 2nd dose, proceed to the ED for an evaluation or call 911).   omeprazole 40 MG capsule Commonly known as: PRILOSEC Take 40 mg by mouth daily.   potassium chloride 10 MEQ tablet Commonly known as: KLOR-CON Take 1 tablet (10 mEq total) by mouth 2 (two) times daily. What changed: when to take this   predniSONE 10 MG tablet Commonly known as: DELTASONE Take 4 tablets (40 mg total) by mouth daily with breakfast for 3 days, THEN 3 tablets (30 mg total) daily with breakfast for 3 days, THEN 2 tablets (20 mg total) daily with breakfast for 3 days, THEN 1 tablet (10 mg total) daily with breakfast for 3  days. Start taking on: May 06, 2020            Durable Medical Equipment  (From admission, onward)         Start     Ordered   05/05/20 6948  For home use only DME oxygen  Once       Question Answer Comment  Length of Need Lifetime   Mode or (Route) Nasal cannula   Liters per Minute 4   Frequency Continuous (stationary and portable oxygen unit needed)   Oxygen conserving device Yes   Oxygen delivery system Gas      05/05/20 0921          Follow-up Information    [facility] Follow up.   Specialty: Home Health Services Why: Will contact you after discharge to schedule home health visits.  Contact information: [place] [address] [phone]        Leonie Douglas, MD Follow up in 1 week(s).   Specialties: Family Medicine, Sports Medicine Contact information: [address] [place] [address]  [phone]              Allergies  Allergen Reactions  . Glipizide Palpitations  . Codeine Nausea And Vomiting  . Doxycycline     Consultations:  Pulmonology   Procedures/Studies: DG Chest 2 View  Result Date: 04/27/2020 CLINICAL DATA:  Short of breath EXAM: CHEST - 2 VIEW COMPARISON:  04/26/2020 FINDINGS: Right lower lobe airspace disease and small infiltrate unchanged. Left lung remains clear.  Negative for heart failure or edema. IMPRESSION: No interval change. Persistent right lower lobe infiltrate and effusion consistent with pneumonia. Electronically Signed   By: Franchot Gallo M.D.   On: 04/27/2020 11:36   [place] CHEST (PLEURAL EFFUSION)  Result Date: 04/28/2020 CLINICAL DATA:  Pleural effusions, for thoracentesis EXAM: CHEST ULTRASOUND COMPARISON:  Chest radiograph 04/28/2020 FINDINGS: Ultrasound the chest demonstrates small BILATERAL pleural effusions. Volume of pleural effusions since identified sonographically is insufficient for expected therapeutic benefit from thoracentesis. Volume of fluid  identified on the LEFT is less than expected based on chest radiograph. Procedure not performed. IMPRESSION: Small BILATERAL pleural effusions; thoracentesis not performed. Electronically Signed   By: Lavonia Dana M.D.   On: 04/28/2020 11:58   DG Chest Port 1 View  Result Date: 04/30/2020 CLINICAL DATA:  Shortness of breath EXAM: PORTABLE CHEST 1 VIEW COMPARISON:  04/28/2020 FINDINGS: Small bilateral pleural effusions. Significantly improved aeration of the left lung compared to prior. Patchy bibasilar airspace opacities. Stable heart size. No pneumothorax. IMPRESSION: 1. Significantly improved aeration of the left lung compared to prior. 2. Small bilateral pleural effusions with patchy bibasilar airspace opacities, atelectasis versus pneumonia. Electronically Signed   By: Davina Poke D.O.   On: 04/30/2020 15:47   DG CHEST PORT 1 VIEW  Result Date: 04/28/2020 CLINICAL DATA:  Respiratory failure EXAM: PORTABLE CHEST 1 VIEW COMPARISON:  04/27/2020 FINDINGS: New large left pleural effusion and diffuse airspace disease throughout the left lung. Moderate right pleural effusion, slightly increased since prior study with increasing right lung airspace disease. IMPRESSION: Enlarging bilateral effusions and worsening bilateral airspace disease, left worse than right. Electronically Signed   By: Rolm Baptise M.D.   On: 04/28/2020 01:13   ECHOCARDIOGRAM LIMITED  Result Date: 04/28/2020    ECHOCARDIOGRAM LIMITED REPORT   Patient Name:   LADON VANDENBERGHE Date of Exam: 04/28/2020 Medical Rec #:  [id number]      Height:       69.0 in Accession #:    [id number]     Weight:       304.2 lb Date of Birth:  [birth year]      BSA:          2.469 m Patient Age:    65 years       BP:           91/30 mmHg Patient Gender: M              HR:           80 bpm. Exam Location:  [facility] Procedure: Limited Echo and Intracardiac Opacification Agent Indications:    CHF-Acute Diastolic I96.78  History:        Patient has prior history  of Echocardiogram examinations, most                 recent 01/30/2020. Previous Myocardial Infarction; Risk                 Factors:Current Smoker and Diabetes. Ischemic cardiomyopathy.  Sonographer:    Leavy Cella  RDCS (AE) Referring Phys: [id number] Royanne Foots [place]  1. Left ventricular ejection fraction, by estimation, is 55 to 60%. The left ventricle has normal function. The left ventricle has no regional wall motion abnormalities. Left ventricular diastolic parameters were normal.  2. Right ventricular systolic function is normal. The right ventricular size is normal.  3. The mitral valve is normal in structure. No evidence of mitral valve regurgitation. No evidence of mitral stenosis.  4. The aortic valve was not well visualized.  5. The inferior vena cava is normal in size with greater than 50% respiratory variability, suggesting right atrial pressure of 3 mmHg. FINDINGS  Left Ventricle: Left ventricular ejection fraction, by estimation, is 55 to 60%. The left ventricle has normal function. The left ventricle has no regional wall motion abnormalities. Definity contrast agent was given IV to delineate the left ventricular  endocardial borders. The left ventricular internal cavity size was normal in size. Left ventricular diastolic parameters were normal. Right Ventricle: The right ventricular size is normal. Right vetricular wall thickness was not assessed. Right ventricular systolic function is normal. Left Atrium: Left atrial size was normal in size. Right Atrium: Right atrial size was not well visualized. Pericardium: There is no evidence of pericardial effusion. Mitral Valve: The mitral valve is normal in structure. No evidence of mitral valve stenosis. Tricuspid Valve: The tricuspid valve is not well visualized. Aortic Valve: The aortic valve was not well visualized. Pulmonic Valve: The pulmonic valve was not well visualized. Pulmonic valve regurgitation is not visualized. No evidence of  pulmonic stenosis. Aorta: The aortic root is normal in size and structure. Venous: The inferior vena cava is normal in size with greater than 50% respiratory variability, suggesting right atrial pressure of 3 mmHg. IAS/Shunts: No atrial level shunt detected by color flow Doppler. LEFT VENTRICLE PLAX 2D LVIDd:         6.25 cm  Diastology LVIDs:         4.76 cm  LV e' medial:    8.38 cm/s LV PW:         0.90 cm  LV E/e' medial:  10.3 LV IVS:        0.95 cm  LV e' lateral:   8.81 cm/s LVOT diam:     2.10 cm  LV E/e' lateral: 9.8 LVOT Area:     3.46 cm  LEFT ATRIUM           Index LA diam:      3.60 cm 1.46 cm/m LA Vol (A4C): 30.0 ml 12.15 ml/m   AORTA Ao Root diam: 3.20 cm MITRAL VALVE MV Area (PHT): 2.61 cm    SHUNTS MV Decel Time: 291 msec    Systemic Diam: 2.10 cm MV E velocity: 85.90 cm/s MV A velocity: 80.40 cm/s MV E/A ratio:  1.07 Carlyle Dolly MD Electronically signed by Carlyle Dolly MD Signature Date/Time: 04/28/2020/11:16:07 AM    Final    [place] EKG SITE RITE  Result Date: 04/28/2020 If Site Rite image not attached, placement could not be confirmed due to current cardiac rhythm.     Discharge Exam: Vitals:   05/05/20 0701 05/05/20 0812  BP:    Pulse:    Resp:    Temp:  98 F (36.7 C)  SpO2: 93%    Vitals:   05/04/20 2349 05/05/20 0021 05/05/20 0701 05/05/20 0812  BP:  (!) 148/76    Pulse:  73    Resp:  (!) 28    Temp: 98 F (  36.7 C)   98 F (36.7 C)  TempSrc: Axillary   Oral  SpO2:  92% 93%   Weight:   122.2 kg   Height:        General: Pt is alert, awake, not in acute distress, obese Cardiovascular: RRR, S1/S2 +, no rubs, no gallops Respiratory: CTA bilaterally, no wheezing, no rhonchi, currently on nasal cannula oxygen Abdominal: Soft, NT, ND, bowel sounds + Extremities: Scant bilateral edema, no cyanosis    The results of significant diagnostics from this hospitalization (including imaging, microbiology, ancillary and laboratory) are listed below for reference.      Microbiology: Recent Results (from the past 240 hour(s))  Blood culture (routine x 2)     Status: None   Collection Time: 04/27/20  4:47 PM   Specimen: Left Antecubital; Blood  Result Value Ref Range Status   Specimen Description LEFT ANTECUBITAL  Final   Special Requests   Final    BOTTLES DRAWN AEROBIC AND ANAEROBIC Blood Culture adequate volume   Culture   Final    NO GROWTH 5 DAYS Performed at [facility]: [facility], [address] [place], [place] [address]    Report Status 05/02/2020 FINAL  Final  Resp Panel by RT-PCR (Flu A&B, Covid) Nasopharyngeal Swab     Status: None   Collection Time: 04/27/20  5:20 PM   Specimen: Nasopharyngeal Swab; Nasopharyngeal(NP) swabs in vial transport medium  Result Value Ref Range Status   SARS Coronavirus 2 by RT PCR NEGATIVE NEGATIVE Final    Comment: (NOTE) SARS-CoV-2 target nucleic acids are NOT DETECTED.  The SARS-CoV-2 RNA is generally detectable in upper respiratory specimens during the acute phase of infection. The lowest concentration of SARS-CoV-2 viral copies this assay can detect is 138 copies/mL. A negative result does not preclude SARS-Cov-2 infection and should not be used as the sole basis for treatment or other patient management decisions. A negative result may occur with  improper specimen collection/handling, submission of specimen other than nasopharyngeal swab, presence of viral mutation(s) within the areas targeted by this assay, and inadequate number of viral copies(<138 copies/mL). A negative result must be combined with clinical observations, patient history, and epidemiological information. The expected result is Negative.  Fact Sheet for Patients:  EntrepreneurPulse.com.au  Fact Sheet for Healthcare Providers:  IncredibleEmployment.be  This test is no t yet approved or cleared by the [place] FDA and  has been authorized for detection and/or diagnosis of SARS-CoV-2 by FDA  under an Emergency Use Authorization (EUA). This EUA will remain  in effect (meaning this test can be used) for the duration of the COVID-19 declaration under Section 564(b)(1) of the Act, 21 U.S.C.section 360bbb-3(b)(1), unless the authorization is terminated  or revoked sooner.       Influenza A by PCR NEGATIVE NEGATIVE Final   Influenza B by PCR NEGATIVE NEGATIVE Final    Comment: (NOTE) The Xpert Xpress SARS-CoV-2/FLU/RSV plus assay is intended as an aid in the diagnosis of influenza from Nasopharyngeal swab specimens and should not be used as a sole basis for treatment. Nasal washings and aspirates are unacceptable for Xpert Xpress SARS-CoV-2/FLU/RSV testing.  Fact Sheet for Patients: EntrepreneurPulse.com.au  Fact Sheet for Healthcare Providers: IncredibleEmployment.be  This test is not yet approved or cleared by the [place] FDA and has been authorized for detection and/or diagnosis of SARS-CoV-2 by FDA under an Emergency Use Authorization (EUA). This EUA will remain in effect (meaning this test can be used) for the duration of the COVID-19 declaration  under Section 564(b)(1) of the Act, 21 U.S.C. section 360bbb-3(b)(1), unless the authorization is terminated or revoked.  Performed at [facility], [address] [place], [place] [address]   Blood culture (routine x 2)     Status: None   Collection Time: 04/27/20  7:43 PM   Specimen: BLOOD RIGHT ARM  Result Value Ref Range Status   Specimen Description BLOOD RIGHT ARM  Final   Special Requests   Final    BOTTLES DRAWN AEROBIC AND ANAEROBIC Blood Culture adequate volume   Culture   Final    NO GROWTH 5 DAYS Performed at [facility], [address], [place], [place] [address]    Report Status 05/02/2020 FINAL  Final     Labs: BNP (last 3 results) Recent Labs    04/27/20 1109  BNP 387.5*   Basic Metabolic Panel: Recent Labs  Lab 04/30/20 0401 05/01/20 0516  05/03/20 0500 05/04/20 0430 05/05/20 0358  NA 147* 143 143 140 141  K 4.1 3.9 3.5 3.7 3.6  CL 94* 91* 87* 87* 87*  CO2 38* 38* 40* 43* 40*  GLUCOSE 171* 160* 121* 152* 133*  BUN 38* 45* 36* 28* 29*  CREATININE 0.94 0.84 0.74 0.67 0.82  CALCIUM 9.0 9.1 8.9 8.7* 9.1  MG 2.4 2.7* 2.4 2.4 2.4   Liver Function Tests: No results for input(s): AST, ALT, ALKPHOS, BILITOT, PROT, ALBUMIN in the last 168 hours. No results for input(s): LIPASE, AMYLASE in the last 168 hours. No results for input(s): AMMONIA in the last 168 hours. CBC: Recent Labs  Lab 04/29/20 0500 05/01/20 0516 05/03/20 0500  WBC 8.0 7.7 5.8  HGB 17.0 16.1 17.0  HCT 61.0* 56.7* 58.9*  MCV 92.6 89.7 87.5  PLT 206 193 188   Cardiac Enzymes: No results for input(s): CKTOTAL, CKMB, CKMBINDEX, TROPONINI in the last 168 hours. BNP: Invalid input(s): POCBNP CBG: Recent Labs  Lab 05/04/20 0737 05/04/20 1202 05/04/20 1615 05/04/20 1954 05/05/20 0810  GLUCAP 195* 221* 123* 211* 140*   D-Dimer No results for input(s): DDIMER in the last 72 hours. Hgb A1c No results for input(s): HGBA1C in the last 72 hours. Lipid Profile No results for input(s): CHOL, HDL, LDLCALC, TRIG, CHOLHDL, LDLDIRECT in the last 72 hours. Thyroid function studies No results for input(s): TSH, T4TOTAL, T3FREE, THYROIDAB in the last 72 hours.  Invalid input(s): FREET3 Anemia work up No results for input(s): VITAMINB12, FOLATE, FERRITIN, TIBC, IRON, RETICCTPCT in the last 72 hours. Urinalysis No results found for: COLORURINE, APPEARANCEUR, [place], [place], GLUCOSEU, [place], [place], [place], [place], UROBILINOGEN, NITRITE, LEUKOCYTESUR Sepsis Labs Invalid input(s): PROCALCITONIN,  WBC,  LACTICIDVEN Microbiology Recent Results (from the past 240 hour(s))  Blood culture (routine x 2)     Status: None   Collection Time: 04/27/20  4:47 PM   Specimen: Left Antecubital; Blood  Result Value Ref Range Status   Specimen Description LEFT  ANTECUBITAL  Final   Special Requests   Final    BOTTLES DRAWN AEROBIC AND ANAEROBIC Blood Culture adequate volume   Culture   Final    NO GROWTH 5 DAYS Performed at [facility], [address] [place], [place] [address]    Report Status 05/02/2020 FINAL  Final  Resp Panel by RT-PCR (Flu A&B, Covid) Nasopharyngeal Swab     Status: None   Collection Time: 04/27/20  5:20 PM   Specimen: Nasopharyngeal Swab; Nasopharyngeal(NP) swabs in vial transport medium  Result Value Ref Range Status   SARS Coronavirus 2 by RT  PCR NEGATIVE NEGATIVE Final    Comment: (NOTE) SARS-CoV-2 target nucleic acids are NOT DETECTED.  The SARS-CoV-2 RNA is generally detectable in upper respiratory specimens during the acute phase of infection. The lowest concentration of SARS-CoV-2 viral copies this assay can detect is 138 copies/mL. A negative result does not preclude SARS-Cov-2 infection and should not be used as the sole basis for treatment or other patient management decisions. A negative result may occur with  improper specimen collection/handling, submission of specimen other than nasopharyngeal swab, presence of viral mutation(s) within the areas targeted by this assay, and inadequate number of viral copies(<138 copies/mL). A negative result must be combined with clinical observations, patient history, and epidemiological information. The expected result is Negative.  Fact Sheet for Patients:  EntrepreneurPulse.com.au  Fact Sheet for Healthcare Providers:  IncredibleEmployment.be  This test is no t yet approved or cleared by the [place] FDA and  has been authorized for detection and/or diagnosis of SARS-CoV-2 by FDA under an Emergency Use Authorization (EUA). This EUA will remain  in effect (meaning this test can be used) for the duration of the COVID-19 declaration under Section 564(b)(1) of the Act, 21 U.S.C.section 360bbb-3(b)(1), unless the  authorization is terminated  or revoked sooner.       Influenza A by PCR NEGATIVE NEGATIVE Final   Influenza B by PCR NEGATIVE NEGATIVE Final    Comment: (NOTE) The Xpert Xpress SARS-CoV-2/FLU/RSV plus assay is intended as an aid in the diagnosis of influenza from Nasopharyngeal swab specimens and should not be used as a sole basis for treatment. Nasal washings and aspirates are unacceptable for Xpert Xpress SARS-CoV-2/FLU/RSV testing.  Fact Sheet for Patients: EntrepreneurPulse.com.au  Fact Sheet for Healthcare Providers: IncredibleEmployment.be  This test is not yet approved or cleared by the [place] FDA and has been authorized for detection and/or diagnosis of SARS-CoV-2 by FDA under an Emergency Use Authorization (EUA). This EUA will remain in effect (meaning this test can be used) for the duration of the COVID-19 declaration under Section 564(b)(1) of the Act, 21 U.S.C. section 360bbb-3(b)(1), unless the authorization is terminated or revoked.  Performed at [facility], [address] [place], [place] [address]   Blood culture (routine x 2)     Status: None   Collection Time: 04/27/20  7:43 PM   Specimen: BLOOD RIGHT ARM  Result Value Ref Range Status   Specimen Description BLOOD RIGHT ARM  Final   Special Requests   Final    BOTTLES DRAWN AEROBIC AND ANAEROBIC Blood Culture adequate volume   Culture   Final    NO GROWTH 5 DAYS Performed at [facility], [address], [place], [place] [address]    Report Status 05/02/2020 FINAL  Final     Time coordinating discharge: 35 minutes  SIGNED:   Rodena Goldmann, DO [facility] 05/05/2020, 10:29 AM  If 7PM-7AM, please contact night-coverage [email or web address]

## 2020-05-05 NOTE — Progress Notes (Signed)
SATURATION QUALIFICATIONS: (This note is used to comply with regulatory documentation for home oxygen)  Patient Saturations on Room Air at Rest = 80%  Patient Saturations on Room Air while Ambulating = 78%  Patient Saturations on 3 Liters of oxygen while Ambulating = 86%  Please briefly explain why patient needs home oxygen: Heart rate increased to 126 and oxygen decreased while walking without oxygen. Placed back on oxygen 3 lpm and walked about 75 feet heart rate dropped to 110 and saturations increased to 86%

## 2020-05-05 NOTE — TOC Progression Note (Signed)
Transition of Care [facility]) - Progression Note    Patient Details  Name: LARRON ARMOR MRN: [id number] Date of Birth: 1956/01/25  Transition of Care [facility]) CM/SW Contact  Boneta Lucks, RN Phone Number: 05/05/2020, 2:45 PM  Clinical Narrative:   Patient discharging home today. Barbaraann Rondo with [employer] sent driver to deliver trilogy and home oxygent to the room. TOC updated Georgina Snell with Alvis Lemmings to [facility] RN. Added to AVS.     Expected Discharge Plan: [place] Barriers to Discharge: Barriers Resolved  Expected Discharge Plan and Services Expected Discharge Plan: [place] In-house Referral: Clinical Social Work Discharge Planning Services: NA Post Acute Care Choice: Durable Medical Equipment Living arrangements for the past 2 months: Single Family Home Expected Discharge Date: 05/05/20               DME Arranged: Oxygen,Other see comment (Trilogy) DME Agency: [facility] Date DME Agency Contacted: 05/01/20   Representative spoke with at DME Agency: Ree Shay Arranged: RN [place] Agency: [place] Date [place]: 05/05/20 Time [place]: [place] Representative spoke with at [place]: Georgina Snell   Readmission Risk Interventions Readmission Risk Prevention Plan 05/05/2020 12/17/2018  Post Dischage Appt Complete -  Medication Screening Complete Complete  Transportation Screening Complete Complete  Some recent data might be hidden

## 2020-05-06 ENCOUNTER — Other Ambulatory Visit: Payer: Self-pay

## 2020-05-06 ENCOUNTER — Telehealth: Payer: Self-pay | Admitting: Cardiology

## 2020-05-06 NOTE — Patient Outreach (Signed)
[place] [facility]) Care Management  05/06/2020  Thomas [name] 12-24-1955 [id number]     Transition of Care Referral  Referral Date: 05/06/2020 Referral Source: [facility] Discharge Report Date of Discharge: 05/05/2020 Facility: [place]: [facility]    Referral received. Transition of care calls being completed via EMMI-automated calls. RN CM will outreach patient for any red flags received.     Plan: RN CM will close case at this time.    Enzo Montgomery, RN,BSN,CCM [place] Management Telephonic Care Management Coordinator Direct Phone: [phone] Toll Free: [phone] Fax: [phone]

## 2020-05-06 NOTE — Telephone Encounter (Signed)
Pt was admitted in the hospital for Pneumonia and his d/c paper work stated he should stop taking his Entresto.   Please give pt a call @ [phone]

## 2020-05-06 NOTE — Telephone Encounter (Signed)
Advised that d/c summary confirms that entresto was stopped. Aware that patient assistance application for [employer] PAF is at nurse desk and will be completed if patient is restarted on entresto. Verbalized understanding.

## 2020-05-16 ENCOUNTER — Other Ambulatory Visit: Payer: Self-pay | Admitting: Cardiology

## 2020-06-20 ENCOUNTER — Other Ambulatory Visit: Payer: Self-pay | Admitting: Cardiology

## 2020-06-22 ENCOUNTER — Encounter: Payer: Self-pay | Admitting: Internal Medicine

## 2020-06-22 ENCOUNTER — Ambulatory Visit: Payer: Medicare HMO | Admitting: Internal Medicine

## 2020-06-22 ENCOUNTER — Other Ambulatory Visit: Payer: Self-pay

## 2020-06-22 VITALS — BP 130/72 | HR 82 | Temp 97.7°F | Ht 69.0 in | Wt 284.0 lb

## 2020-06-22 DIAGNOSIS — J9622 Acute and chronic respiratory failure with hypercapnia: Secondary | ICD-10-CM

## 2020-06-22 DIAGNOSIS — J449 Chronic obstructive pulmonary disease, unspecified: Secondary | ICD-10-CM

## 2020-06-22 DIAGNOSIS — J9601 Acute respiratory failure with hypoxia: Secondary | ICD-10-CM

## 2020-06-22 DIAGNOSIS — F17210 Nicotine dependence, cigarettes, uncomplicated: Secondary | ICD-10-CM | POA: Diagnosis not present

## 2020-06-22 DIAGNOSIS — J9621 Acute and chronic respiratory failure with hypoxia: Secondary | ICD-10-CM | POA: Diagnosis not present

## 2020-06-22 DIAGNOSIS — J9602 Acute respiratory failure with hypercapnia: Secondary | ICD-10-CM

## 2020-06-22 MED ORDER — TRELEGY ELLIPTA 100-62.5-25 MCG/INH IN AEPB: 1.0000 | INHALATION_SPRAY | Freq: Every day | RESPIRATORY_TRACT | 0 refills | Status: DC

## 2020-06-22 NOTE — Patient Instructions (Addendum)
Plan A = Automatic = Always=    Trelegy 100   One cick each  Am - two good drags and out thru the nose   Plan B = Backup (to supplement plan A, not to replace it) Only use your albuterol inhaler as a rescue medication to be used if you can't catch your breath by resting or doing a relaxed purse lip breathing pattern.  - The less you use it, the better it will work when you need it. - Ok to use the inhaler up to 2 puffs  every 4 hours if you must but call for appointment if use goes up over your usual need - Don't leave home without it !!  (think of it like the spare tire for your car)     Please schedule a follow up office visit in 6 weeks, call sooner if needed with PFTs - bring inhalers and your drug formulary (provided by your insurance company, not me)

## 2020-06-22 NOTE — Assessment & Plan Note (Signed)
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

## 2020-06-22 NOTE — Progress Notes (Signed)
SEYDOU HEARNS, male    DOB: 1955-10-09     MRN: [id number]   Brief patient profile:  65 yowm active smoker can't afford maint rx   Admit date: 04/27/2020 Discharge date: 05/05/2020  Recommendations for Outpatient Follow-up:  1. Follow up with PCP in 1-2 weeks 2. Follow-up BMP and CBC in 1 week 3. Follow-up with pulmonology outpatient in 2 weeks for outpatient PFTs and set up for sleep study 4. Continue on home oxygen as prescribed 4 L 24/7 5. Prednisone taper over 2 weeks as prescribed 6. Continue now on increased dose of Lasix 40 mg twice daily 7. Continue other home medications as prior  Equipment/Devices: Home oxygen 4 L nasal cannula   Brief/Interim Summary: [name] a 65 y.o.malewith medical history significant forsystolic CHF/ischemic cardiomyopathy, COPD, diabetes mellitus. Patient presented to the ED with complaints of increasing difficulty breathing over the past 2 months, productive cough.Patient was initially admitted with acute hypoxemic respiratory failure that appears multifactorial in the setting of pneumonia and COPD as well as acute on chronic heart failure. Unfortunately, he has had worsening oxygen saturations and dyspnea noted overnight on 1/11 prompting transfer to ICU and placement on BiPAP.Hehas been weaned off BiPAP and is currently requiring 4 L nasal cannula oxygen. Patient was treated for COPD as well as acute on chronic diastolic congestive heart failure exacerbation and has had excellent diuresis during this admission of near 20L-this is course, took several days. He is down to a weight of 269 pounds and his usual baseline at home is around 280. His renal function has remained stable and he has completed his antibiotic course for pneumonia. . He has had PT evaluation with no home recommendations. He will require 4 L home nasal cannula oxygen which will be arranged. His home Lasix dose will be increased to 40 mg twice daily from 20 mg twice  daily. His daughter who stays with him is a nurse will ensure that he follows his appropriate diet and weighs himself daily. Pulmonology to follow-up with him outpatient to consider trilogy machine.  Discharge Diagnoses:  Principal Problem:   PNA (pneumonia) Active Problems:   Morbid obesity ([place])   Ischemic cardiomyopathy   Tobacco abuse   DM (diabetes mellitus) ([place])   Acute respiratory failure with hypoxia and hypercapnia (HCC)   Acute on chronic systolic CHF (congestive heart failure) (HCC)   Acute respiratory failure with hypoxia (HCC)   Acute on chronic respiratory failure with hypoxia and hypercapnia (HCC)  Principal discharge diagnosis: Acute on chronic hypoxemic and hypercapnic respiratory failure-multifactorial in setting of acute on chronic diastolic CHF exacerbation as well as COPD exacerbation with pneumonia.     History of Present Illness  06/22/2020  Pulmonary/ 1st office eval/ [facility] / [place] Office  Chief Complaint  Patient presents with  . Pulmonary Consult    Referred by Dr. Manuella Ghazi. Pt states hospitalized for PNA  Jan 2022. He denies having any SOB. He states "I always have a cough, I smoke".  He was placed on supplemental o2 after recent hospitalization. He is using his albuterol inhaler 2 x per wk on average.   Dyspnea:  [employer] lion sev aisles and then R>L knee pain limiting  Cough: nasal congestion in am despite water in chamber Sleep: on R side bed is flat/ 3 pillows  SABA use: rarely needing   No obvious day to day or daytime variability or assoc excess/ purulent sputum or mucus plugs or hemoptysis or cp or chest tightness,  subjective wheeze or over  hb symptoms.   Sleeping  without nocturnal  or early am exacerbation  of respiratory  c/o's or need for noct saba. Also denies any obvious fluctuation of symptoms with weather or environmental changes or other aggravating or alleviating factors except as outlined above   No unusual exposure hx or h/o childhood  pna/ asthma or knowledge of premature birth.  Current Allergies, Complete Past Medical History, Past Surgical History, Family History, and Social History were reviewed in [employer] record.  ROS  The following are not active complaints unless bolded Hoarseness, sore throat, dysphagia, dental problems, itching, sneezing,  nasal congestion or discharge of excess mucus or purulent secretions, ear ache,   fever, chills, sweats, unintended wt loss or wt gain, classically pleuritic or exertional cp,  orthopnea pnd or arm/hand swelling  or leg swelling, presyncope, palpitations, abdominal pain, anorexia, nausea, vomiting, diarrhea  or change in bowel habits or change in bladder habits, change in stools or change in urine, dysuria, hematuria,  rash, arthralgias, visual complaints, headache, numbness, weakness or ataxia or problems with walking or coordination,  change in mood or  memory.           Past Medical History:  Diagnosis Date  . CHF (congestive heart failure) ([place])   . COPD (chronic obstructive pulmonary disease) ([place])   . Coronary artery disease   . Morbid obesity ([place])   . NSTEMI (non-ST elevated myocardial infarction) ([place])    a. s/p cath in 03/2018 showing 90% OM2 stenosis and nonobstructive CAD along LAD and LCx with medical management recommended  . Pneumonia   . Secondary cardiomyopathy ([place])    a. EF reported as 20-25% by echo in 03/2018 b. EF at 45% by repeat imaging in 10/2018  . Snoring   . Type 2 diabetes mellitus ([place])     Outpatient Medications Prior to Visit  Medication Sig Dispense Refill  . albuterol (VENTOLIN HFA) 108 (90 Base) MCG/ACT inhaler Inhale 2 puffs into the lungs every 6 (six) hours as needed for wheezing or shortness of breath.    Marland Kitchen aspirin EC 81 MG tablet Take 81 mg by mouth daily.    Marland Kitchen atorvastatin (LIPITOR) 80 MG tablet TAKE 1 TABLET (80 MG TOTAL) BY MOUTH DAILY AT 6 PM. (Patient taking differently: Take 80 mg by mouth every  evening.) 90 tablet 3  . canagliflozin (INVOKANA) 300 MG TABS tablet Take 300 mg by mouth daily before breakfast.    . carvedilol (COREG) 12.5 MG tablet TAKE 1 TABLET (12.5 MG TOTAL) BY MOUTH 2 (TWO) TIMES DAILY. 180 tablet 3  . clopidogrel (PLAVIX) 75 MG tablet TAKE ONE TABLET BY MOUTH DAILY WITH BREAKFAST 90 tablet 3  . furosemide (LASIX) 40 MG tablet Take 1 tablet (40 mg total) by mouth 2 (two) times daily. 60 tablet 1  . ibuprofen (ADVIL) 200 MG tablet Take 200 mg by mouth every 6 (six) hours as needed for fever or mild pain.    . isosorbide mononitrate (IMDUR) 60 MG 24 hr tablet Take 1 tablet (60 mg total) by mouth daily. 90 tablet 3  . metFORMIN (GLUCOPHAGE-XR) 500 MG 24 hr tablet Take 1,000 mg by mouth 2 (two) times daily.    . nitroGLYCERIN (NITROSTAT) 0.4 MG SL tablet Place 1 tablet (0.4 mg total) under the tongue every 5 (five) minutes x 3 doses as needed for chest pain (if no relief after 2nd dose, proceed to the ED for an evaluation or call 911).  25 tablet 3  . omeprazole (PRILOSEC) 40 MG capsule Take 40 mg by mouth daily.    . potassium chloride (KLOR-CON) 10 MEQ tablet Take 1 tablet (10 mEq total) by mouth 2 (two) times daily. 90 tablet 1  . Ipratropium-Albuterol (COMBIVENT RESPIMAT) 20-100 MCG/ACT AERS respimat Inhale 1 puff into the lungs every 6 (six) hours as needed for wheezing or shortness of breath. 4 g 2   No facility-administered medications prior to visit.     Objective:     BP 130/72 (BP Location: Left Arm, Cuff Size: Normal)   Pulse 82   Temp 97.7 F (36.5 C) (Temporal)   Ht 5\' 9"  (1.753 m)   Wt 284 lb (128.8 kg)   SpO2 96% Comment: 3 lpm pulsed 02  BMI 41.94 kg/m   SpO2: 96 % (3 lpm pulsed 02) O2 Type: Pulse O2 O2 Flow Rate (L/min): 3 L/min   Obese wm nad    HEENT : pt wearing mask not removed for exam due to covid - 19 concerns.    NECK :  without JVD/Nodes/TM/ nl carotid upstrokes bilaterally   LUNGS: no acc muscle use,  Min barrel  contour  chest wall with bilateral  Distant bs with localized wheeze RUL and  without cough on insp or exp maneuvers  and mild  Hyperresonant  to  percussion bilaterally     CV:  RRR  no s3 or murmur or increase in P2, and no edema   ABD:  Obese / soft and nontender with pos end  insp Hoover's  in the supine position. No bruits or organomegaly appreciated, bowel sounds nl  MS:   Nl gait/  ext warm without deformities, calf tenderness, cyanosis or clubbing No obvious joint restrictions   SKIN: warm and dry without lesions    NEURO:  alert, approp, nl sensorium with  no motor or cerebellar deficits apparent.     I personally reviewed images and agree with radiology impression as follows:  CXR:   04/30/20  Portable  1. Significantly improved aeration of the left lung compared to prior. 2. Small bilateral pleural effusions with patchy bibasilar airspace opacities, atelectasis versus pneumonia.           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    COPD GOLD ?  /  active smoker 06/22/2020  After extensive coaching inhaler device,  effectiveness =    90% with elipta > try trelegy   Most likely  Group D in terms of symptom/risk and laba/lama/ICS  therefore appropriate rx at this point >>>  Try trelegy pending f/u with pfts   Re saba: I spent extra time with pt today reviewing appropriate use of albuterol for prn use on exertion with the following points: 1) saba is for relief of sob that does not improve  by walking a slower pace or resting but  rather if the pt does not improve after trying this first. 2) If the pt is convinced, as many are, that saba helps recover from activity faster then it's easy to tell if this is the case by re-challenging : ie stop, take the inhaler, then p 5 minutes try the exact same activity (intensity of workload) that just caused the symptoms and see if they are substantially diminished or not after saba 3) if there is an activity that reproducibly causes the symptoms, try the saba 15 min before the activity on alternate days   If in fact the saba really does help, then fine to continue to use it prn but advised may need to look closer at the maintenance regimen being used to achieve better control of airways disease with exertion.      Cigarette smoker 4-5 min discussion re active cigarette smoking in addition to office E&M  Ask about tobacco use:   ongoing Advise quitting   I took an extended  opportunity with this patient to outline the consequences of continued cigarette use  in airway disorders based on all the data we have from the multiple national lung health studies (perfomed over decades at millions of dollars in cost)  indicating that smoking cessation, not choice of inhalers or physicians, is the most important aspect of care.   Assess willingness:  Not committed at this point Assist in quit attempt:  Per PCP when ready Arrange follow up:   Follow up per Primary Care planned           Each maintenance medication was reviewed in detail including emphasizing most importantly the difference between maintenance and prns and under what circumstances the prns are to be triggered using an action plan format where appropriate.  Total time for H and P, chart review, counseling, reviewing elipta  device(s) and generating customized AVS unique to this new pt office visit / same day charting = 38 min         Christinia Gully, MD 06/22/2020

## 2020-07-13 ENCOUNTER — Telehealth: Payer: Self-pay | Admitting: Internal Medicine

## 2020-07-13 NOTE — Telephone Encounter (Signed)
Spoke with the pt  He has been using his trelegy and it's making his blood sugar go up as high as 350  He stopped taking med for a few days and it is back in the normal range  Would like to try another inhaler that will not do this  Please advise, thanks

## 2020-07-14 ENCOUNTER — Encounter: Payer: Self-pay | Admitting: Internal Medicine

## 2020-07-14 DIAGNOSIS — J449 Chronic obstructive pulmonary disease, unspecified: Secondary | ICD-10-CM | POA: Insufficient documentation

## 2020-07-14 DIAGNOSIS — F17210 Nicotine dependence, cigarettes, uncomplicated: Secondary | ICD-10-CM | POA: Insufficient documentation

## 2020-07-14 MED ORDER — ANORO ELLIPTA 62.5-25 MCG/INH IN AEPB: 1.0000 | INHALATION_SPRAY | Freq: Every day | RESPIRATORY_TRACT | 2 refills | Status: DC

## 2020-07-14 NOTE — Assessment & Plan Note (Signed)
06/22/2020  After extensive coaching inhaler device,  effectiveness =    90% with elipta > try trelegy   Most likely  Group D in terms of symptom/risk and laba/lama/ICS  therefore appropriate rx at this point >>>  Try trelegy pending f/u with pfts   Re saba: I spent extra time with pt today reviewing appropriate use of albuterol for prn use on exertion with the following points: 1) saba is for relief of sob that does not improve by walking a slower pace or resting but rather if the pt does not improve after trying this first. 2) If the pt is convinced, as many are, that saba helps recover from activity faster then it's easy to tell if this is the case by re-challenging : ie stop, take the inhaler, then p 5 minutes try the exact same activity (intensity of workload) that just caused the symptoms and see if they are substantially diminished or not after saba 3) if there is an activity that reproducibly causes the symptoms, try the saba 15 min before the activity on alternate days   If in fact the saba really does help, then fine to continue to use it prn but advised may need to look closer at the maintenance regimen being used to achieve better control of airways disease with exertion.

## 2020-07-14 NOTE — Telephone Encounter (Signed)
Called and spoke with Patient. Dr. Gustavus Bryant recommendations given. Understanding stated. Anoro prescription sent to [facility]. Nothing further at this time.

## 2020-07-14 NOTE — Assessment & Plan Note (Addendum)
4-5 min discussion re active cigarette smoking in addition to office E&M  Ask about tobacco use:   ongoing Advise quitting   I took an extended  opportunity with this patient to outline the consequences of continued cigarette use  in airway disorders based on all the data we have from the multiple national lung health studies (perfomed over decades at millions of dollars in cost)  indicating that smoking cessation, not choice of inhalers or physicians, is the most important aspect of care.   Assess willingness:  Not committed at this point Assist in quit attempt:  Per PCP when ready Arrange follow up:   Follow up per Primary Care planned                 Each maintenance medication was reviewed in detail including emphasizing most importantly the difference between maintenance and prns and under what circumstances the prns are to be triggered using an action plan format where appropriate.  Total time for H and P, chart review, counseling, reviewing elipta  device(s) and generating customized AVS unique to this new pt office visit / same day charting = 38 min

## 2020-07-14 NOTE — Telephone Encounter (Signed)
Try anoro one click each am if can afford on his insurance or we have samples to offer pending f/u ov   If all else fails can use neb qid until returns to office to regroup

## 2020-07-15 ENCOUNTER — Telehealth: Payer: Self-pay | Admitting: Internal Medicine

## 2020-07-15 NOTE — Telephone Encounter (Signed)
PA request was received from (pharmacy): [facility] Fax: [phone] Medication name and strength: Anoro Ordering Provider: MW  Was PA started with CMM?: Yes If yes, please enter KEY: [id number] Medication tried and failed: Trelegy 136mcg, Duoneb and Combivent  Covered Alternatives: Arnuity, Francee Nodal, Spiriva, Symbicort and Wixela  PA sent to plan, time frame for approval / denial: Unknown time Routing to [place] for follow-up

## 2020-07-17 NOTE — Telephone Encounter (Signed)
Checked status of PA, it is still under review. Will continue to follow up.

## 2020-07-20 NOTE — Telephone Encounter (Signed)
Called and spoke. Patient stated he received a Anoro PA denial letter. Patient stated letter does say insurance will cover [place].  Message routed to Dr. Melvyn Novas to advise

## 2020-07-20 NOTE — Telephone Encounter (Signed)
Patient states received denial letter from insurance for Anoro. Patient phone number is [phone].They cover Bevespi.

## 2020-07-20 NOTE — Telephone Encounter (Signed)
Ok to start bevespi Take 2 puffs first thing in am and then another 2 puffs about 12 hours later but we need to see him w/in 4 weeks to be sure it's working as effectively - bring inhaler with him to office

## 2020-07-21 MED ORDER — BEVESPI AEROSPHERE 9-4.8 MCG/ACT IN AERO: 2.0000 | INHALATION_SPRAY | Freq: Two times a day (BID) | RESPIRATORY_TRACT | 3 refills | Status: DC

## 2020-07-21 NOTE — Telephone Encounter (Signed)
Called spoke with patient. Let him know Dr. Gustavus Bryant recommendations.  Appointment made in [place] for 08/21/20 at [place] verified Orders placed Nothing further needed at this time.

## 2020-07-21 NOTE — Addendum Note (Signed)
Addended by: Amado Coe on: 07/21/2020 12:34 PM   Modules accepted: Orders

## 2020-07-22 ENCOUNTER — Telehealth: Payer: Self-pay | Admitting: Internal Medicine

## 2020-07-22 NOTE — Telephone Encounter (Signed)
Can try [place] &[place] if qualifies, otherwise nothing to suggest  But make sure we see him w/in 6 weeks to review progress on the albuterol as we usually just use this as a backup with little to back it up if this fails

## 2020-07-22 NOTE — Telephone Encounter (Signed)
Called and spoke with pt and he stated that the bevespi is going to cost him $100 per month.  He stated that he cannot afford this and he is going to stick with the albuterol inhaler.  Pt stated that he is on a fixed income and that is not doable for him.  MW please advise. Thanks

## 2020-07-23 NOTE — Telephone Encounter (Signed)
Called and spoke to pt. Pt states he has tried pt assistance in the past and is denied as he states he "makes too much money to qualify". Pt denies any acute s/s at this time. Pt sounded well on the phone. Pt has a follow up on 5/6 with MW.   Will forward to MW as Juluis Rainier.

## 2020-08-17 NOTE — Progress Notes (Signed)
Cardiology Office Note  Date: 08/18/2020   ID: Thomas [name], DOB [birth year], MRN [id number]  PCP:  Leonie Douglas, MD  Cardiologist:  Rozann Lesches, MD Electrophysiologist:  None   Chief Complaint  Patient presents with  . Cardiac follow-up    History of Present Illness: Thomas [name] is a 65 y.o. male last seen in November 2021.  He presents for a follow-up visit.  Reports no change in shortness of breath, states that he uses supplemental oxygen at nighttime only at this point.  His weight has been stable by home scales, currently on Lasix at 20 mg twice daily with potassium supplement.  He does not report any angina symptoms nitroglycerin use.  We went over his medications which are outlined below.  He is due to see Dr. Melvyn Novas later this week.  Past Medical History:  Diagnosis Date  . CHF (congestive heart failure) ([place])   . COPD (chronic obstructive pulmonary disease) ([place])   . Coronary artery disease   . Morbid obesity ([place])   . NSTEMI (non-ST elevated myocardial infarction) ([place])    a. s/p cath in 03/2018 showing 90% OM2 stenosis and nonobstructive CAD along LAD and LCx with medical management recommended  . Pneumonia   . Secondary cardiomyopathy ([place])    a. EF reported as 20-25% by echo in 03/2018 b. EF at 45% by repeat imaging in 10/2018  . Snoring   . Type 2 diabetes mellitus ([place])     Past Surgical History:  Procedure Laterality Date  . LEFT HEART CATH AND CORONARY ANGIOGRAPHY N/A 03/30/2018   Procedure: LEFT HEART CATH AND CORONARY ANGIOGRAPHY;  Surgeon: [place] Sine, MD;  Location: [facility];  Service: Cardiovascular;  Laterality: N/A;    Current Outpatient Medications  Medication Sig Dispense Refill  . albuterol (VENTOLIN HFA) 108 (90 Base) MCG/ACT inhaler Inhale 2 puffs into the lungs every 6 (six) hours as needed for wheezing or shortness of breath.    Marland Kitchen aspirin EC 81 MG tablet Take 81 mg by mouth daily.    Marland Kitchen atorvastatin (LIPITOR) 80 MG  tablet TAKE 1 TABLET (80 MG TOTAL) BY MOUTH DAILY AT 6 PM. (Patient taking differently: Take 80 mg by mouth every evening.) 90 tablet 3  . canagliflozin (INVOKANA) 300 MG TABS tablet Take 300 mg by mouth daily before breakfast.    . clopidogrel (PLAVIX) 75 MG tablet TAKE ONE TABLET BY MOUTH DAILY WITH BREAKFAST 90 tablet 3  . Fluticasone-Umeclidin-Vilant (TRELEGY ELLIPTA) 100-62.5-25 MCG/INH AEPB Inhale 1 puff into the lungs daily. 14 each 0  . Glycopyrrolate-Formoterol (BEVESPI AEROSPHERE) 9-4.8 MCG/ACT AERO Inhale 2 puffs into the lungs 2 (two) times daily. 10.7 g 3  . ibuprofen (ADVIL) 200 MG tablet Take 200 mg by mouth every 6 (six) hours as needed for fever or mild pain.    . metFORMIN (GLUCOPHAGE-XR) 500 MG 24 hr tablet Take 1,000 mg by mouth 2 (two) times daily.    . nitroGLYCERIN (NITROSTAT) 0.4 MG SL tablet Place 1 tablet (0.4 mg total) under the tongue every 5 (five) minutes x 3 doses as needed for chest pain (if no relief after 2nd dose, proceed to the ED for an evaluation or call 911). 25 tablet 3  . omeprazole (PRILOSEC) 40 MG capsule Take 40 mg by mouth daily.    . potassium chloride (KLOR-CON) 10 MEQ tablet Take 1 tablet (10 mEq total) by mouth 2 (two) times daily. 90 tablet 1  . umeclidinium-vilanterol (ANORO ELLIPTA) 62.5-25  MCG/INH AEPB Inhale 1 puff into the lungs daily. 60 each 2  . carvedilol (COREG) 12.5 MG tablet TAKE 1 TABLET (12.5 MG TOTAL) BY MOUTH 2 (TWO) TIMES DAILY. 180 tablet 3  . furosemide (LASIX) 20 MG tablet Take 1 tablet (20 mg total) by mouth 2 (two) times daily. 180 tablet 3  . isosorbide mononitrate (IMDUR) 60 MG 24 hr tablet Take 1 tablet (60 mg total) by mouth daily. 90 tablet 3   No current facility-administered medications for this visit.   Allergies:  Glipizide, Codeine, and Doxycycline   ROS: No palpitations or syncope.  Physical Exam: VS:  BP 120/68   Pulse 89   Ht 5\' 9"  (1.753 m)   Wt 284 lb (128.8 kg)   SpO2 (!) 87%   BMI 41.94 kg/m , BMI  Body mass index is 41.94 kg/m.  Wt Readings from Last 3 Encounters:  08/18/20 284 lb (128.8 kg)  06/22/20 284 lb (128.8 kg)  05/05/20 269 lb 8 oz (122.2 kg)    General: Morbidly obese male,appears comfortable at rest. HEENT: Conjunctiva and lids normal, wearing a mask. Neck: Supple, no elevated JVP. Lungs: Decreased breath sounds with prolonged expiratory phase, no active wheezing. Cardiac: Regular rate and rhythm, no S3 or significant systolic murmur. Abdomen: Protuberant. Extremities: Stable appearing lower extremity edema.  ECG:  An ECG dated 04/27/2020 was personally reviewed today and demonstrated:  Sinus rhythm with poor R wave progression and lead motion artifact.  Recent Labwork: 04/27/2020: ALT 49; AST 23; B Natriuretic Peptide 149.0 04/28/2020: TSH 0.481 05/03/2020: Hemoglobin 17.0; Platelets 188 05/05/2020: BUN 29; Creatinine, Ser 0.82; Magnesium 2.4; Potassium 3.6; Sodium 141     Component Value Date/Time   CHOL 181 03/30/2018 0543   TRIG 322 (H) 03/30/2018 0543   HDL 26 (L) 03/30/2018 0543   CHOLHDL 7.0 03/30/2018 0543   VLDL 64 (H) 03/30/2018 0543   LDLCALC 91 03/30/2018 0543    Other Studies Reviewed Today:  Echocardiogram 01/30/2020: 1. Very difficult visualization of the left ventricle, recommend  echocontrast for better evaluation. LVEF is probably normal >50%. Left  ventricular ejection fraction, by estimation, is difficult to assess%. The  left ventricle has normal function. Left  ventricular endocardial border not optimally defined to evaluate regional  wall motion. There is mild left ventricular hypertrophy. Left ventricular  diastolic parameters are indeterminate.  2. Right ventricular systolic function is normal. The right ventricular  size is normal.  3. The mitral valve was not well visualized. No evidence of mitral valve  regurgitation. No evidence of mitral stenosis.  4. The aortic valve is tricuspid. Aortic valve regurgitation is not   visualized. No aortic stenosis is present.   Comparison(s): Previous Echo TDS. Visual EF estimated at 45%, moderate  hypokinesis of mid-apical anteroseptal wall.   Assessment and Plan:  1.  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  Most recent LVEF greater than 50% by echocardiogram in October 2021.  Current Lasix dose is 20 mg twice daily with potassium supplement, he has not had to take any extra doses and weight has been stable.  Continue Coreg and Imdur as well.  2.  Branch vessel CAD without active angina.  Continue aspirin, Lipitor, Plavix, Coreg, Imdur, and as needed nitroglycerin.  3.  Uncontrolled type 2 diabetes mellitus.  He is on Invokana and Glucophage XR.  Awaits follow-up hemoglobin A1c per PCP in June.  Reports having exacerbation of hyperglycemia when on Trelegy.  4.  Chronic hypoxic respiratory failure with COPD.  Medication Adjustments/Labs and Tests Ordered: Current medicines are reviewed at length with the patient today.  Concerns regarding medicines are outlined above.   Tests Ordered: No orders of the defined types were placed in this encounter.   Medication Changes: Meds ordered this encounter  Medications  . furosemide (LASIX) 20 MG tablet    Sig: Take 1 tablet (20 mg total) by mouth 2 (two) times daily.    Dispense:  180 tablet    Refill:  3    Disposition:  Follow up 6 months.  Signed, Satira Sark, MD, [facility] 08/18/2020 8:29 AM    [address] [place], [place], [place] [address] Phone: [phone]; Fax: [phone]

## 2020-08-18 ENCOUNTER — Encounter: Payer: Self-pay | Admitting: Cardiology

## 2020-08-18 ENCOUNTER — Ambulatory Visit: Payer: Medicare HMO | Admitting: Cardiology

## 2020-08-18 ENCOUNTER — Other Ambulatory Visit: Payer: Self-pay

## 2020-08-18 VITALS — BP 120/68 | HR 89 | Ht 69.0 in | Wt 284.0 lb

## 2020-08-18 DIAGNOSIS — I25119 Atherosclerotic heart disease of native coronary artery with unspecified angina pectoris: Secondary | ICD-10-CM

## 2020-08-18 DIAGNOSIS — I5042 Chronic combined systolic (congestive) and diastolic (congestive) heart failure: Secondary | ICD-10-CM

## 2020-08-18 MED ORDER — FUROSEMIDE 20 MG PO TABS: 20.0000 mg | ORAL_TABLET | Freq: Two times a day (BID) | ORAL | 3 refills | Status: DC

## 2020-08-18 NOTE — Patient Instructions (Addendum)

## 2020-08-21 ENCOUNTER — Ambulatory Visit: Payer: Medicare HMO | Admitting: Internal Medicine

## 2020-08-21 ENCOUNTER — Encounter: Payer: Self-pay | Admitting: Internal Medicine

## 2020-08-21 ENCOUNTER — Other Ambulatory Visit: Payer: Self-pay

## 2020-08-21 VITALS — BP 130/68 | HR 98 | Temp 97.1°F | Ht 69.0 in | Wt 283.6 lb

## 2020-08-21 DIAGNOSIS — R0789 Other chest pain: Secondary | ICD-10-CM | POA: Diagnosis not present

## 2020-08-21 DIAGNOSIS — J449 Chronic obstructive pulmonary disease, unspecified: Secondary | ICD-10-CM | POA: Diagnosis not present

## 2020-08-21 DIAGNOSIS — F17210 Nicotine dependence, cigarettes, uncomplicated: Secondary | ICD-10-CM | POA: Diagnosis not present

## 2020-08-21 DIAGNOSIS — J9602 Acute respiratory failure with hypercapnia: Secondary | ICD-10-CM

## 2020-08-21 DIAGNOSIS — J9601 Acute respiratory failure with hypoxia: Secondary | ICD-10-CM | POA: Diagnosis not present

## 2020-08-21 NOTE — Progress Notes (Signed)
Thomas [name], male    DOB: [birth year]     MRN: [id number]   Brief patient profile:  76 yowm active smoker can't afford maint rx   Admit date: 04/27/2020 Discharge date: 05/05/2020  Recommendations for Outpatient Follow-up:  1. Follow up with PCP in 1-2 weeks 2. Follow-up BMP and CBC in 1 week 3. Follow-up with pulmonology outpatient in 2 weeks for outpatient PFTs and set up for sleep study 4. Continue on home oxygen as prescribed 4 L 24/7 5. Prednisone taper over 2 weeks as prescribed 6. Continue now on increased dose of Lasix 40 mg twice daily 7. Continue other home medications as prior  Equipment/Devices: Home oxygen 4 L nasal cannula   Brief/Interim Summary: Thomas Searing Aubinis a 65 y.o.malewith medical history significant forsystolic CHF/ischemic cardiomyopathy, COPD, diabetes mellitus. Patient presented to the ED with complaints of increasing difficulty breathing over the past 2 months, productive cough.Patient was initially admitted with acute hypoxemic respiratory failure that appears multifactorial in the setting of pneumonia and COPD as well as acute on chronic heart failure. Unfortunately, he has had worsening oxygen saturations and dyspnea noted overnight on 1/11 prompting transfer to ICU and placement on BiPAP.Hehas been weaned off BiPAP and is currently requiring 4 L nasal cannula oxygen. Patient was treated for COPD as well as acute on chronic diastolic congestive heart failure exacerbation and has had excellent diuresis during this admission of near 20L-this is course, took several days. He is down to a weight of 269 pounds and his usual baseline at home is around 280. His renal function has remained stable and he has completed his antibiotic course for pneumonia. . He has had PT evaluation with no home recommendations. He will require 4 L home nasal cannula oxygen which will be arranged. His home Lasix dose will be increased to 40 mg twice daily from 20 mg twice  daily. His daughter who stays with him is a nurse will ensure that he follows his appropriate diet and weighs himself daily. Pulmonology to follow-up with him outpatient to consider trilogy machine.  Discharge Diagnoses:  Principal Problem:   PNA (pneumonia) Active Problems:   Morbid obesity ([place])   Ischemic cardiomyopathy   Tobacco abuse   DM (diabetes mellitus) ([place])   Acute respiratory failure with hypoxia and hypercapnia (HCC)   Acute on chronic systolic CHF (congestive heart failure) (H[facility])   Acute respiratory failure with hypoxia ([facility])   Acute on chronic respiratory failure with hypoxia and hypercapnia ([facility])  Principal discharge diagnosis: Acute on chronic hypoxemic and hypercapnic respiratory failure-multifactorial in setting of acute on chronic diastolic CHF exacerbation as well as COPD exacerbation with pneumonia.     History of Present Illness  06/22/2020  Pulmonary/ 1st office eval/ Thomas [name] / [name]  Chief Complaint  Patient presents with  . Pulmonary Consult    Referred by Dr. Manuella [name]. Pt states hospitalized for PNA  Jan 2022. He denies having any SOB. He states "I always have a cough, I smoke".  He was placed on supplemental o2 after recent hospitalization. He is using his albuterol inhaler 2 x per wk on average.   Dyspnea:  Food lion sev aisles and then R>L knee pain limiting  Cough: nasal congestion in am despite water in chamber Sleep: on R side bed is flat/ 3 pillows  SABA use: rarely needing  rec Plan A = Automatic = Always=    Trelegy 100   One cick each  Am - two good drags and  out thru the nose  Plan B = Backup (to supplement plan A, not to replace it) Only use your albuterol inhaler as a rescue medication Please schedule a follow up office visit in 6 weeks, call sooner if needed with PFTs - bring inhalers and your drug formulary (provided by your insurance company, not me)    08/21/2020  f/u ov/[facility] office/Thomas [name] re: copd ? Stage/ hypercarbic ?  OHS/still smoking  Chief Complaint  Patient presents with  . Follow-up    No complaints currently    Dyspnea:  Limited more by knee than breathing  Cough: worse after eating, sometimes chokes  Sleeping: flat bed on side 3 pillows / says feels fine in am  SABA use: once or twice for cough  02: 02 3lpm hs and prn  Covid status: vax x 3  Lung cancer screening: last CT 12/19/2018 > referred for LDSCT shared decision making Pain under L rib better p flatus x 2011 never supine    No obvious day to day or daytime variability or assoc excess/ purulent sputum or mucus plugs or hemoptysis or   chest tightness, subjective wheeze or overt sinus or hb symptoms.   sleeping without nocturnal  or early am exacerbation  of respiratory  c/o's or need for noct saba. Also denies any obvious fluctuation of symptoms with weather or environmental changes or other aggravating or alleviating factors except as outlined above   No unusual exposure hx or h/o childhood pna/ asthma or knowledge of premature birth.  Current Allergies, Complete Past Medical History, Past Surgical History, Family History, and Social History were reviewed in [employer] record.  ROS  The following are not active complaints unless bolded Hoarseness, sore throat, dysphagia, dental problems, itching, sneezing,  nasal congestion or discharge of excess mucus or purulent secretions, ear ache,   fever, chills, sweats, unintended wt loss or wt gain, classically pleuritic or exertional cp,  orthopnea pnd or arm/hand swelling  or leg swelling, presyncope, palpitations, abdominal pain, anorexia, nausea, vomiting, diarrhea  or change in bowel habits or change in bladder habits, change in stools or change in urine, dysuria, hematuria,  rash, arthralgias, visual complaints, headache, numbness, weakness or ataxia or problems with walking or coordination,  change in mood or  memory.        Current Meds  Medication Sig  . albuterol  (VENTOLIN HFA) 108 (90 Base) MCG/ACT inhaler Inhale 2 puffs into the lungs every 6 (six) hours as needed for wheezing or shortness of breath.  Marland Kitchen aspirin EC 81 MG tablet Take 81 mg by mouth daily.  Marland Kitchen atorvastatin (LIPITOR) 80 MG tablet TAKE 1 TABLET (80 MG TOTAL) BY MOUTH DAILY AT 6 PM. (Patient taking differently: Take 80 mg by mouth every evening.)  . canagliflozin (INVOKANA) 300 MG TABS tablet Take 300 mg by mouth daily before breakfast.  . carvedilol (COREG) 12.5 MG tablet TAKE 1 TABLET (12.5 MG TOTAL) BY MOUTH 2 (TWO) TIMES DAILY.  Marland Kitchen clopidogrel (PLAVIX) 75 MG tablet TAKE ONE TABLET BY MOUTH DAILY WITH BREAKFAST  . furosemide (LASIX) 20 MG tablet Take 1 tablet (20 mg total) by mouth 2 (two) times daily.  Marland Kitchen ibuprofen (ADVIL) 200 MG tablet Take 200 mg by mouth every 6 (six) hours as needed for fever or mild pain.  . isosorbide mononitrate (IMDUR) 60 MG 24 hr tablet Take 1 tablet (60 mg total) by mouth daily.  . metFORMIN (GLUCOPHAGE-XR) 500 MG 24 hr tablet Take 500 mg by mouth in  the morning, at noon, in the evening, and at bedtime.  . nitroGLYCERIN (NITROSTAT) 0.4 MG SL tablet Place 1 tablet (0.4 mg total) under the tongue every 5 (five) minutes x 3 doses as needed for chest pain (if no relief after 2nd dose, proceed to the ED for an evaluation or call 911).  Marland Kitchen omeprazole (PRILOSEC) 40 MG capsule Take 40 mg by mouth daily.  . potassium chloride (KLOR-CON) 10 MEQ tablet Take 1 tablet (10 mEq total) by mouth 2 (two) times daily.                 Past Medical History:  Diagnosis Date  . CHF (congestive heart failure) ([place])   . COPD (chronic obstructive pulmonary disease) ([place])   . Coronary artery disease   . Morbid obesity ([place])   . NSTEMI (non-ST elevated myocardial infarction) ([place])    a. s/p cath in 03/2018 showing 90% OM2 stenosis and nonobstructive CAD along LAD and LCx with medical management recommended  . Pneumonia   . Secondary cardiomyopathy ([place])    a. EF reported as 20-25% by  echo in 03/2018 b. EF at 45% by repeat imaging in 10/2018  . Snoring   . Type 2 diabetes mellitus ([facility])       Objective:     Wt Readings from Last 3 Encounters:  08/21/20 283 lb 9.6 oz (128.6 kg)  08/18/20 284 lb (128.8 kg)  06/22/20 284 lb (128.8 kg)      Vital signs reviewed  08/21/2020  - Note at rest 02 sats  90% on RA   General appearance:   Obese wm / gruff voice/ pseudowheeze     HEENT : pt wearing mask not removed for exam due to covid - 19 concerns.   NECK :  without JVD/Nodes/TM/ nl carotid upstrokes bilaterally   LUNGS: no acc muscle use,  Min barrel  contour chest wall with bilateral  slightly decreased bs s audible wheeze and  without cough on insp or exp maneuvers and min  Hyperresonant  to  percussion bilaterally     CV:  RRR  no s3 or murmur or increase in P2, and no edema   ABD:  Quite obese but soft and nontender with pos end  insp Hoover's  in the supine position. No bruits or organomegaly appreciated, bowel sounds nl  MS:   Nl gait/  ext warm without deformities, calf tenderness, cyanosis or clubbing No obvious joint restrictions   SKIN: warm and dry without lesions    NEURO:  alert, approp, nl sensorium with  no motor or cerebellar deficits apparent.             Assessment

## 2020-08-21 NOTE — Assessment & Plan Note (Signed)
Counseled re importance of smoking cessation but did not meet time criteria for separate billing    Low-dose CT lung cancer screening is recommended for patients who are 7-63 years of age with a 30+ pack-year history of smoking, and who are currently smoking or quit <=15 years ago. >> pt eligible/ referred

## 2020-08-21 NOTE — Assessment & Plan Note (Signed)
HC03  05/05/20  = 40  -  06/22/2020   Walked RA  approx   300 ft  @ moderate pace  stopped due to  A leg and back pain/no SOB with sats still 90% at end    -  08/21/2020   Walked RA  approx   300 ft  @ slow pace  stopped due to  Tired/ sats still 87% RA     rec continue 3lpm hs and prn daytime with goal > 86% since hypercarbic

## 2020-08-21 NOTE — Assessment & Plan Note (Signed)
06/22/2020  After extensive coaching inhaler device,  effectiveness =    90% with elipta > try trelegy > no better as of 08/21/2020 so just rec prn saba   Does not appear to be limited by sob at this point > ok to just use saba prn and call if need goes up

## 2020-08-21 NOTE — Patient Instructions (Addendum)
I will refer you today to [name] who runs the lung cancer screening program for shared decision making   Classic subdiaphragmatic pain pattern suggests ibs:  Stereotypical, migratory with a very limited distribution of pain locations, daytime, not usually exacerbated by exercise  or coughing, worse in sitting position, frequently associated with generalized abd bloating, not as likely to be present supine due to the dome effect of the diaphragm which  is  canceled in that position. Frequently these patients have had multiple negative GI workups and CT scans.  Treatment consists of avoiding foods that cause gas (especially boiled eggs, mexcican food but especially  beans and undercooked vegetables like  spinach and some salads)  and citrucel 1 heaping tsp twice daily with a large glass of water.  Pain should improve w/in 2 weeks and if not then consider further GI work up.    Be sure you take omeprazole 40 mg Take 30-60 min before first meal of the day   Only use your albuterol as a rescue medication to be used if you can't catch your breath by resting or doing a relaxed purse lip breathing pattern.  - The less you use it, the better it will work when you need it. - Ok to use up to 2 puffs  every 4 hours if you must but call for immediate appointment if use goes up over your usual need - Don't leave home without it !!  (think of it like the spare tire for your car)   The key is to stop smoking completely before smoking completely stops you!  Please schedule a follow up visit in 3 months - PFTs on return

## 2020-08-21 NOTE — Assessment & Plan Note (Signed)
Present daily since 2011 only in upright position, relieved by flatus  - rec rx as IBS 08/21/2020 >>>   Classic subdiaphragmatic pain pattern suggests ibs:  Stereotypical, migratory with a very limited distribution of pain locations, daytime, not usually exacerbated by exercise  or coughing, worse in sitting position, frequently associated with generalized abd bloating, not as likely to be present supine due to the dome effect of the diaphragm which  is  canceled in that position. Frequently these patients have had multiple negative GI workups and CT scans.  Treatment consists of avoiding foods that cause gas (especially boiled eggs, mexcican food but especially  beans and undercooked vegetables like  spinach and some salads)  and citrucel 1 heaping tsp twice daily with a large glass of water.  Pain should improve w/in 2 weeks and if not then consider further GI work up.     Each maintenance medication was reviewed in detail including emphasizing most importantly the difference between maintenance and prns and under what circumstances the prns are to be triggered using an action plan format where appropriate.  Total time for H and P, chart review, counseling, reviewing hfa  device(s) , directly observing portions of ambulatory 02 saturation study/ and generating customized AVS unique to this office visit / same day charting  > 30 min

## 2020-08-27 ENCOUNTER — Other Ambulatory Visit: Payer: Self-pay | Admitting: *Deleted

## 2020-08-27 DIAGNOSIS — F17210 Nicotine dependence, cigarettes, uncomplicated: Secondary | ICD-10-CM

## 2020-08-27 DIAGNOSIS — Z87891 Personal history of nicotine dependence: Secondary | ICD-10-CM

## 2020-08-28 ENCOUNTER — Telehealth: Payer: Self-pay | Admitting: Internal Medicine

## 2020-08-28 NOTE — Telephone Encounter (Signed)
Received income documents on pt with note written on it that these are for [facility] pt assistance. According to recent phone note he was already denied pt assistance for [facility]. There were no new [place] and [place] forms attached to income info. Need to clarify what he is needing. If wants to try again for assistance he will need to sign the forms and fill out. LMTCB.

## 2020-08-28 NOTE — Telephone Encounter (Signed)
Spoke to patient's spouse, Frankie(DPR). Tharon Aquas stated that patient would like to apply for patient assistance.  Patient was not at home at time of call. Tharon Aquas will have patient contact our office and let us know how he would like to proceed.

## 2020-09-09 NOTE — Telephone Encounter (Signed)
Tried calling pt and there was no answer and no option to leave msg, [facility]

## 2020-09-15 ENCOUNTER — Other Ambulatory Visit: Payer: Self-pay | Admitting: Cardiology

## 2020-10-07 ENCOUNTER — Other Ambulatory Visit: Payer: Self-pay

## 2020-10-07 ENCOUNTER — Ambulatory Visit (INDEPENDENT_AMBULATORY_CARE_PROVIDER_SITE_OTHER): Payer: Medicare HMO | Admitting: Acute Care

## 2020-10-07 ENCOUNTER — Ambulatory Visit (HOSPITAL_COMMUNITY)
Admission: RE | Admit: 2020-10-07 | Discharge: 2020-10-07 | Disposition: A | Discharge status: Home / Self Care | Payer: Medicare HMO | Source: Ambulatory Visit | Attending: Acute Care | Admitting: Acute Care

## 2020-10-07 ENCOUNTER — Telehealth: Payer: Self-pay | Admitting: *Deleted

## 2020-10-07 ENCOUNTER — Encounter: Payer: Self-pay | Admitting: Acute Care

## 2020-10-07 DIAGNOSIS — F17210 Nicotine dependence, cigarettes, uncomplicated: Secondary | ICD-10-CM

## 2020-10-07 DIAGNOSIS — Z87891 Personal history of nicotine dependence: Secondary | ICD-10-CM | POA: Diagnosis present

## 2020-10-07 NOTE — Progress Notes (Signed)
Virtual Visit via Telephone Note  I connected with Thomas [name] on 10/07/20 at  9:30 AM EDT by telephone and verified that I am speaking with the correct person using two identifiers.  Location: Patient: At home Provider: [address], [place], [place], [address]    I discussed the limitations, risks, security and privacy concerns of performing an evaluation and management service by telephone and the availability of in person appointments. I also discussed with the patient that there may be a patient responsible charge related to this service. The patient expressed understanding and agreed to proceed.    Shared Decision Making Visit Lung Cancer Screening Program [phone])   Eligibility: Age 65 y.o. Pack Years Smoking History Calculation  (# packs/per year x # years smoked) Recent History of coughing up blood  no Unexplained weight loss? no ( >Than 15 pounds within the last 6 months ) Prior History Lung / other cancer no (Diagnosis within the last 5 years already requiring surveillance chest CT Scans). Smoking Status Current Smoker Former Smokers: Years since quit:   Quit Date: NA  Visit Components: Discussion included one or more decision making aids. yes Discussion included risk/benefits of screening. yes Discussion included potential follow up diagnostic testing for abnormal scans. yes Discussion included meaning and risk of over diagnosis. yes Discussion included meaning and risk of False Positives. yes Discussion included meaning of total radiation exposure. yes  Counseling Included: Importance of adherence to annual lung cancer LDCT screening. yes Impact of comorbidities on ability to participate in the program. yes Ability and willingness to under diagnostic treatment. yes  Smoking Cessation Counseling: Current Smokers:  Discussed importance of smoking cessation. yes Information about tobacco cessation classes and interventions provided to patient.  yes Patient provided with "ticket" for LDCT Scan. yes Symptomatic Patient. no  Counseling NA Diagnosis Code: Tobacco Use Z72.0 Asymptomatic Patient yes  Counseling (Intermediate counseling: > three minutes counseling) [id number] Former Smokers:  Discussed the importance of maintaining cigarette abstinence. yes Diagnosis Code: Personal History of Nicotine Dependence. P38.250 Information about tobacco cessation classes and interventions provided to patient. Yes Patient provided with "ticket" for LDCT Scan. yes Written Order for Lung Cancer Screening with LDCT placed in Epic. Yes (CT Chest Lung Cancer Screening Low Dose W/O CM) [id number] Z12.2-Screening of respiratory organs Z87.891-Personal history of nicotine dependence  I have spent 25 minutes of non-face to face time with Mr. Alcindor discussing the risks and benefits of lung cancer screening. We viewed a power point together that explained in detail the above noted topics. We paused at intervals to allow for questions to be asked and answered to ensure understanding.We discussed that the single most powerful action that he can take to decrease his risk of developing lung cancer is to quit smoking. We discussed whether or not he is ready to commit to setting a quit date. We discussed options for tools to aid in quitting smoking including nicotine replacement therapy, non-nicotine medications, support groups, Quit Smart classes, and behavior modification. We discussed that often times setting smaller, more achievable goals, such as eliminating 1 cigarette a day for a week and then 2 cigarettes a day for a week can be helpful in slowly decreasing the number of cigarettes smoked. This allows for a sense of accomplishment as well as providing a clinical benefit. I gave him the " Be Stronger Than Your Excuses" card with contact information for community resources, classes, free nicotine replacement therapy, and access to mobile apps, text messaging, and on-line  smoking cessation help. I have also given him my card and contact information in the event he needs to contact me. We discussed the time and location of the scan, and that either Doroteo Glassman RN or I will call with the results within 24-48 hours of receiving them. I have offered him  a copy of the power point we viewed  as a resource in the event they need reinforcement of the concepts we discussed today in the office. The patient verbalized understanding of all of  the above and had no further questions upon leaving the office. They have my contact information in the event they have any further questions.  I spent 4-5  minutes counseling on smoking cessation and the health risks of continued tobacco abuse.  I explained to the patient that there has been a high incidence of coronary artery disease noted on these exams. I explained that this is a non-gated exam therefore degree or severity cannot be determined. This patient is on statin therapy. I have asked the patient to follow-up with their PCP regarding any incidental finding of coronary artery disease and management with diet or medication as their PCP  feels is clinically indicated. The patient verbalized understanding of the above and had no further questions upon completion of the visit.   Pt. States he is not interested in having a pharmacist call to discuss smoking cessation because his insurance will not pay for any smoking cessation therapy, and he cannot afford it without insurance to help.   Magdalen Spatz, NP 10/07/2020

## 2020-10-07 NOTE — Telephone Encounter (Signed)
Called and spoke with patient, he states his CT scan is scheduled for 10:45 am today.  Advised that SG will call him back after she finished with a previous telephone visit.  He verbalized understanding.  Nothing further needed.

## 2020-10-07 NOTE — Patient Instructions (Signed)
Thank you for participating in the [facility]. It was our pleasure to meet you today. We will call you with the results of your scan within the next few days. Your scan will be assigned a Lung RADS category score by the physicians reading the scans.  This Lung RADS score determines follow up scanning.  See below for description of categories, and follow up screening recommendations. We will be in touch to schedule your follow up screening annually or based on recommendations of our providers. We will fax a copy of your scan results to your Primary Care Physician, or the physician who referred you to the program, to ensure they have the results. Please call the office if you have any questions or concerns regarding your scanning experience or results.  Our office number is [phone]. Please speak with Denise Phelps, RN. She is our Lung Cancer Screening RN. If she is unavailable when you call, please have the office staff send her a message. She will return your call at her earliest convenience. Remember, if your scan is normal, we will scan you annually as long as you continue to meet the criteria for the program. (Age 55-77, Current smoker or smoker who has quit within the last 15 years). If you are a smoker, remember, quitting is the single most powerful action that you can take to decrease your risk of lung cancer and other pulmonary, breathing related problems. We know quitting is hard, and we are here to help.  Please let us know if there is anything we can do to help you meet your goal of quitting. If you are a former smoker, congratulations. We are proud of you! Remain smoke free! Remember you can refer friends or family members through the number above.  We will screen them to make sure they meet criteria for the program. Thank you for helping us take better care of you by participating in Lung Screening.  Lung RADS Categories:  Lung RADS 1: no nodules  or definitely non-concerning nodules.  Recommendation is for a repeat annual scan in 12 months.  Lung RADS 2:  nodules that are non-concerning in appearance and behavior with a very low likelihood of becoming an active cancer. Recommendation is for a repeat annual scan in 12 months.  Lung RADS 3: nodules that are probably non-concerning , includes nodules with a low likelihood of becoming an active cancer.  Recommendation is for a 6-month repeat screening scan. Often noted after an upper respiratory illness. We will be in touch to make sure you have no questions, and to schedule your 6-month scan.  Lung RADS 4 A: nodules with concerning findings, recommendation is most often for a follow up scan in 3 months or additional testing based on our provider's assessment of the scan. We will be in touch to make sure you have no questions and to schedule the recommended 3 month follow up scan.  Lung RADS 4 B:  indicates findings that are concerning. We will be in touch with you to schedule additional diagnostic testing based on our provider's  assessment of the scan.   

## 2020-10-08 ENCOUNTER — Telehealth: Payer: Self-pay | Admitting: Acute Care

## 2020-10-08 NOTE — Telephone Encounter (Signed)
Received call report from Kenton Vale with [facility] on patient's L[facility] CT done on 10/07/20. SG please review the result/impression copied below:  IMPRESSION: 1. Lung-RADS 4B, suspicious. Additional imaging evaluation or consultation with Pulmonology or Thoracic Surgery recommended. Right middle and lower lobe collapse associated with moderate right pleural effusion. Occlusion of the bronchus intermedius evident. Central obstructing mass lesion a concern. CT chest with contrast recommended to further evaluate. These changes have been slowly progressive since 12/19/2018 and if neoplastic etiology has already been excluded, then given the degree of collapse/consolidation in the right lung, this patient is not a candidate for lung cancer screening. 2. Enlargement of the pulmonary outflow tract and main pulmonary arteries suggests pulmonary arterial hypertension. 3. Aortic Atherosclerosis (ICD10-I70.0).  Please advise, thank you.

## 2020-10-08 NOTE — Progress Notes (Signed)
I have called the patient with the results of his low-dose CT. I explained that he had an abnormal scan, and that he had an occlusion in one of his airways that was concerning and needed further evaluation.  I have reviewed the scan with Dr. Valeta Harms, who would like to see the patient in the office to evaluate his lung collapse.  The patient is scheduled with Dr. Valeta Harms October 15, 2020 at 11:30 AM and a consult nodule spot. Denise please fax results to PCP and let them know patient has follow-up with Dr. Valeta Harms to evaluate for treatment. Please leave on tickler list so that we can make sure if this is not a cancer he follows up with annual screening.

## 2020-10-09 NOTE — Telephone Encounter (Signed)
Results faxed to PCP with plans for pt to f/u with Dr Valeta Harms regarding CT results.

## 2020-10-15 ENCOUNTER — Telehealth: Payer: Self-pay | Admitting: Pulmonary Disease

## 2020-10-15 ENCOUNTER — Other Ambulatory Visit: Payer: Self-pay

## 2020-10-15 ENCOUNTER — Ambulatory Visit: Payer: Medicare HMO | Admitting: Pulmonary Disease

## 2020-10-15 ENCOUNTER — Encounter: Payer: Self-pay | Admitting: Pulmonary Disease

## 2020-10-15 VITALS — BP 128/70 | HR 97 | Temp 98.0°F | Wt 283.6 lb

## 2020-10-15 DIAGNOSIS — J449 Chronic obstructive pulmonary disease, unspecified: Secondary | ICD-10-CM

## 2020-10-15 DIAGNOSIS — R062 Wheezing: Secondary | ICD-10-CM

## 2020-10-15 DIAGNOSIS — J9819 Other pulmonary collapse: Secondary | ICD-10-CM | POA: Diagnosis not present

## 2020-10-15 DIAGNOSIS — R918 Other nonspecific abnormal finding of lung field: Secondary | ICD-10-CM | POA: Diagnosis not present

## 2020-10-15 MED ORDER — ALBUTEROL SULFATE (2.5 MG/3ML) 0.083% IN NEBU: 2.5000 mg | INHALATION_SOLUTION | Freq: Four times a day (QID) | RESPIRATORY_TRACT | 11 refills | Status: AC | PRN

## 2020-10-15 NOTE — Patient Instructions (Addendum)
Thank you for visiting Dr. Valeta Harms at [facility]. Today we recommend the following:  Orders Placed This Encounter  Procedures   Procedural/ Surgical Case Request: [facility]   Ambulatory referral to Pulmonology   Please stop PLAVIX on July 6th   Return in about 4 weeks (around 11/12/2020) for with APP or Dr. Valeta Harms.    Please do your part to reduce the spread of COVID-19.

## 2020-10-15 NOTE — Telephone Encounter (Signed)
Pt informed of the following:  [facility] sched for 7/13 @ 9:00am; 6:30am arrival time at [facility].  Pt will have covid test before [facility].

## 2020-10-15 NOTE — H&P (View-Only) (Signed)
Synopsis: Referred in June 2022 for abnormal CT chest by Leonie Douglas, MD  Subjective:   PATIENT ID: Thomas [name] GENDER: male DOB: May 30, 1955, MRN: [id number]  Chief Complaint  Patient presents with   Lung Lesion    Wants to know if he can have a nebulizer     65 years old, past medical history of congestive heart failure, COPD, NSTEMI, prior cardiomyopathy, repeat echocardiogram in 2020 with a EF of 45%.  He is morbidly obese.  He recently had a lung cancer screening CT which showed right lower lobe collapse and possible endobronchial mass.  He has been a longstanding smoker.  He has smoked since age 89 years old.  He has had a persistent right-sided chest wheeze.  He was admitted back in January with pneumonia.  Chest x-ray at the time also showed right lower lobe partial collapse.  Now the whole lower lobe is collapsed.    Of note, the patient was belligerent and rude during his office visit.  He complained for waiting for his appointment however the patient showed up 1 hour before his appointment time and we saw him 9 minutes early.  Patient complained about having to drive to [facility] to be seen today.   Past Medical History:  Diagnosis Date   CHF (congestive heart failure) (HCC)    COPD (chronic obstructive pulmonary disease) (HCC)    Coronary artery disease    Morbid obesity (HCC)    NSTEMI (non-ST elevated myocardial infarction) ([place])    a. s/p cath in 03/2018 showing 90% OM2 stenosis and nonobstructive CAD along LAD and LCx with medical management recommended   Pneumonia    Secondary cardiomyopathy ([place])    a. EF reported as 20-25% by echo in 03/2018 b. EF at 45% by repeat imaging in 10/2018   Snoring    Type 2 diabetes mellitus ([place])      Family History  Problem Relation Age of Onset   Emphysema Father    Heart failure Father      Past Surgical History:  Procedure Laterality Date   LEFT HEART CATH AND CORONARY ANGIOGRAPHY N/A 03/30/2018   Procedure:  LEFT HEART CATH AND CORONARY ANGIOGRAPHY;  Surgeon: Troy Sine, MD;  Location: [facility];  Service: Cardiovascular;  Laterality: N/A;    Social History   Socioeconomic History   Marital status: Married    Spouse name: Not on file   Number of children: Not on file   Years of education: Not on file   Highest education level: Not on file  Occupational History   Not on file  Tobacco Use   Smoking status: Every Day    Packs/day: 1.00    Years: 55.00    Pack years: 55.00    Types: Cigarettes    Start date: 02/21/1965   Smokeless tobacco: Never   Tobacco comments:    smokes 10 cigarettes per day 08/21/20  Vaping Use   Vaping Use: Never used  Substance and Sexual Activity   Alcohol use: Not Currently   Drug use: Yes    Types: Marijuana    Comment: denies   Sexual activity: Not on file  Other Topics Concern   Not on file  Social History Narrative   Not on file   Social Determinants of Health   Financial Resource Strain: Not on file  Food Insecurity: Not on file  Transportation Needs: Not on file  Physical Activity: Not on file  Stress: Not on file  Social Connections: Not on file  Intimate Partner Violence: Not on file     Allergies  Allergen Reactions   Glipizide Palpitations   Codeine Nausea And Vomiting   Doxycycline      Outpatient Medications Prior to Visit  Medication Sig Dispense Refill   albuterol (VENTOLIN HFA) 108 (90 Base) MCG/ACT inhaler Inhale 2 puffs into the lungs every 6 (six) hours as needed for wheezing or shortness of breath.     aspirin EC 81 MG tablet Take 81 mg by mouth daily.     atorvastatin (LIPITOR) 80 MG tablet TAKE 1 TABLET (80 MG TOTAL) BY MOUTH DAILY AT 6 PM. (Patient taking differently: Take 80 mg by mouth every evening.) 90 tablet 3   canagliflozin (INVOKANA) 300 MG TABS tablet Take 300 mg by mouth daily before breakfast.     carvedilol (COREG) 12.5 MG tablet TAKE 1 TABLET (12.5 MG TOTAL) BY MOUTH 2 (TWO) TIMES DAILY. 180  tablet 3   clopidogrel (PLAVIX) 75 MG tablet TAKE ONE TABLET BY MOUTH DAILY WITH BREAKFAST 90 tablet 3   furosemide (LASIX) 20 MG tablet Take 1 tablet (20 mg total) by mouth 2 (two) times daily. 180 tablet 3   ibuprofen (ADVIL) 200 MG tablet Take 200 mg by mouth every 6 (six) hours as needed for fever or mild pain.     isosorbide mononitrate (IMDUR) 60 MG 24 hr tablet TAKE ONE TABLET BY MOUTH EVERY DAY 90 tablet 3   ketoconazole (NIZORAL) 2 % cream      metFORMIN (GLUCOPHAGE-XR) 500 MG 24 hr tablet Take 500 mg by mouth in the morning, at noon, in the evening, and at bedtime.     nitroGLYCERIN (NITROSTAT) 0.4 MG SL tablet Place 1 tablet (0.4 mg total) under the tongue every 5 (five) minutes x 3 doses as needed for chest pain (if no relief after 2nd dose, proceed to the ED for an evaluation or call 911). 25 tablet 3   omeprazole (PRILOSEC) 40 MG capsule Take 40 mg by mouth daily.     potassium chloride (KLOR-CON) 10 MEQ tablet Take 1 tablet (10 mEq total) by mouth 2 (two) times daily. 90 tablet 1   potassium chloride (KLOR-CON) 10 MEQ tablet      No facility-administered medications prior to visit.    Review of Systems  Constitutional:  Negative for chills, fever, malaise/fatigue and weight loss.  HENT:  Negative for hearing loss, sore throat and tinnitus.   Eyes:  Negative for blurred vision and double vision.  Respiratory:  Positive for shortness of breath and wheezing. Negative for cough, hemoptysis, sputum production and stridor.   Cardiovascular:  Negative for chest pain, palpitations, orthopnea, leg swelling and PND.  Gastrointestinal:  Negative for abdominal pain, constipation, diarrhea, heartburn, nausea and vomiting.  Genitourinary:  Negative for dysuria, hematuria and urgency.  Musculoskeletal:  Negative for joint pain and myalgias.  Skin:  Negative for itching and rash.  Neurological:  Negative for dizziness, tingling, weakness and headaches.  Endo/Heme/Allergies:  Negative for  environmental allergies. Does not bruise/bleed easily.  Psychiatric/Behavioral:  Negative for depression. The patient is not nervous/anxious and does not have insomnia.   All other systems reviewed and are negative.   Objective:  Physical Exam Vitals reviewed.  Constitutional:      General: He is not in acute distress.    Appearance: He is well-developed.  HENT:     Head: Normocephalic and atraumatic.  Eyes:     General: No scleral icterus.  Conjunctiva/sclera: Conjunctivae normal.     Pupils: Pupils are equal, round, and reactive to light.  Neck:     Vascular: No JVD.     Trachea: No tracheal deviation.  Cardiovascular:     Rate and Rhythm: Normal rate and regular rhythm.     Heart sounds: Normal heart sounds. No murmur heard. Pulmonary:     Effort: Pulmonary effort is normal. No tachypnea, accessory muscle usage or respiratory distress.     Breath sounds: No stridor. Wheezing present. No rhonchi or rales.     Comments: Right-sided isolated wheeze Abdominal:     General: Bowel sounds are normal. There is no distension.     Palpations: Abdomen is soft.     Tenderness: There is no abdominal tenderness.  Musculoskeletal:        General: No tenderness.     Cervical back: Neck supple.     Right lower leg: Edema present.     Left lower leg: Edema present.  Lymphadenopathy:     Cervical: No cervical adenopathy.  Skin:    General: Skin is warm and dry.     Capillary Refill: Capillary refill takes less than 2 seconds.     Findings: No rash.  Neurological:     Mental Status: He is alert and oriented to person, place, and time.  Psychiatric:        Behavior: Behavior normal.     Vitals:   10/15/20 1009  BP: 128/70  Pulse: 97  Temp: 98 F (36.7 C)  SpO2: 90%  Weight: 283 lb 9.6 oz (128.6 kg)   90% on RA BMI Readings from Last 3 Encounters:  10/15/20 41.88 kg/m  08/21/20 41.88 kg/m  08/18/20 41.94 kg/m   Wt Readings from Last 3 Encounters:  10/15/20 283 lb  9.6 oz (128.6 kg)  08/21/20 283 lb 9.6 oz (128.6 kg)  08/18/20 284 lb (128.8 kg)     CBC    Component Value Date/Time   WBC 5.8 05/03/2020 0500   RBC 6.73 (H) 05/03/2020 0500   HGB 17.0 05/03/2020 0500   HCT 58.9 (H) 05/03/2020 0500   PLT 188 05/03/2020 0500   MCV 87.5 05/03/2020 0500   MCH 25.3 (L) 05/03/2020 0500   MCHC 28.9 (L) 05/03/2020 0500   RDW 17.2 (H) 05/03/2020 0500    Chest Imaging:  10/07/2020 lung cancer screening CT: Right lower lobe collapse, possible endobronchial lesion, concern for obstructing mass. The patient's images have been independently reviewed by me.    Pulmonary Functions Testing Results: No flowsheet data found.  FeNO:   Pathology:   Echocardiogram:   Heart Catheterization:     Assessment & Plan:     ICD-10-CM   1. 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 Lung collapse  J98.19 AMB REFERRAL FOR DME    albuterol (PROVENTIL) (2.5 MG/3ML) 0.083% nebulizer solution    3. Wheezing  R06.2     4. COPD GOLD ?  /  active smoker  J44.9       Discussion: 65 year old morbidly obese gentleman, abnormal lung cancer screening CT with a right lower lobe collapse, is possibly centrally obstructing lesion.  He has longstanding history of smoking since age 65 years old.  He has a chronic oxygen dependent respiratory failure.  We also discussed that his CT  scan could just represent a benign etiology such as mucous plugging within the right.  Today does not appear to have any significant infectious symptoms and he has a persistent right-sided wheeze in the chest on exam.  Plan: I am concerned for a right-sided centralized obstructing lesion due to patient's history. Additionally he has chest x-ray from January 2022 which also shows partial right lower lobe collapse. He  had not had any other recent axial CT imaging.  We reviewed CT imaging today in the office.  The patient is seemingly very concerned that he could have a cancer. He is also relatively upset and angry about the whole situation. We spent most of the time in the office today attempting to de-escalate the situation. Initially the patient did not want to have anything done whatsoever however his wife was insistent that he should at least consider having a bronchoscopy for further evaluation to see if we are dealing with a malignancy or if this is benign etiology.  Therefore today in the office we discussed the risk benefits and alternatives of consideration of bronchoscopy. I explained that I would not consider doing a bronchoscopy if he was not willing to have biopsy if this was a malignancy. He at least agreed to have the bronchoscopy done. He is on Plavix and this will need to be held prior to the procedure.  Because the patient has a potential centrally obstructing right mainstem mass and would require cryotherapy with tumor debulking I would recommend the patient to be off of his Plavix for at least 7 days before the case.  We will work with scheduling to try to get his case done as soon as possible.  If, the patient decides to cancel this procedure or not show up to any other further appointments he will need to seek care at a different pulmonary practice.  Patient was also counseled on appropriate behavior in the office and how to speak to staff.   Current Outpatient Medications:    albuterol (VENTOLIN HFA) 108 (90 Base) MCG/ACT inhaler, Inhale 2 puffs into the lungs every 6 (six) hours as needed for wheezing or shortness of breath., Disp: , Rfl:    aspirin EC 81 MG tablet, Take 81 mg by mouth daily., Disp: , Rfl:    atorvastatin (LIPITOR) 80 MG tablet, TAKE 1 TABLET (80 MG TOTAL) BY MOUTH DAILY AT 6 PM. (Patient taking differently: Take 80 mg by mouth every evening.), Disp: 90 tablet, Rfl:  3   canagliflozin (INVOKANA) 300 MG TABS tablet, Take 300 mg by mouth daily before breakfast., Disp: , Rfl:    carvedilol (COREG) 12.5 MG tablet, TAKE 1 TABLET (12.5 MG TOTAL) BY MOUTH 2 (TWO) TIMES DAILY., Disp: 180 tablet, Rfl: 3   clopidogrel (PLAVIX) 75 MG tablet, TAKE ONE TABLET BY MOUTH DAILY WITH BREAKFAST, Disp: 90 tablet, Rfl: 3   furosemide (LASIX) 20 MG tablet, Take 1 tablet (20 mg total) by mouth 2 (two) times daily., Disp: 180 tablet, Rfl: 3   ibuprofen (ADVIL) 200 MG tablet, Take 200 mg by mouth every 6 (six) hours as needed for fever or mild pain., Disp: , Rfl:    isosorbide mononitrate (IMDUR) 60 MG 24 hr tablet, TAKE ONE TABLET BY MOUTH EVERY DAY, Disp: 90 tablet, Rfl: 3   ketoconazole (NIZORAL) 2 % cream, , Disp: , Rfl:    metFORMIN (GLUCOPHAGE-XR) 500 MG 24 hr tablet, Take 500 mg by mouth in the morning, at noon, in the evening, and at bedtime., Disp: ,  Rfl:    nitroGLYCERIN (NITROSTAT) 0.4 MG SL tablet, Place 1 tablet (0.4 mg total) under the tongue every 5 (five) minutes x 3 doses as needed for chest pain (if no relief after 2nd dose, proceed to the ED for an evaluation or call 911)., Disp: 25 tablet, Rfl: 3   omeprazole (PRILOSEC) 40 MG capsule, Take 40 mg by mouth daily., Disp: , Rfl:    potassium chloride (KLOR-CON) 10 MEQ tablet, Take 1 tablet (10 mEq total) by mouth 2 (two) times daily., Disp: 90 tablet, Rfl: 1   potassium chloride (KLOR-CON) 10 MEQ tablet, , Disp: , Rfl:   I spent 62 minutes dedicated to the care of this patient on the date of this encounter to include pre-visit review of records, face-to-face time with the patient discussing conditions above, post visit ordering of testing, clinical documentation with the electronic health record, making appropriate referrals as documented, and communicating necessary findings to members of the patients care team.   Garner Nash, [facility] 10/15/2020 11:38 AM

## 2020-10-15 NOTE — Progress Notes (Signed)
Synopsis: Referred in June 2022 for abnormal CT chest by Leonie Douglas, MD  Subjective:   PATIENT ID: Thomas [name] GENDER: male DOB: July 19, 1955, MRN: [id number]  Chief Complaint  Patient presents with   Lung Lesion    Wants to know if he can have a nebulizer     65 years old, past medical history of congestive heart failure, COPD, NSTEMI, prior cardiomyopathy, repeat echocardiogram in 2020 with a EF of 45%.  He is morbidly obese.  He recently had a lung cancer screening CT which showed right lower lobe collapse and possible endobronchial mass.  He has been a longstanding smoker.  He has smoked since age 68 years old.  He has had a persistent right-sided chest wheeze.  He was admitted back in January with pneumonia.  Chest x-ray at the time also showed right lower lobe partial collapse.  Now the whole lower lobe is collapsed.    Of note, the patient was belligerent and rude during his office visit.  He complained for waiting for his appointment however the patient showed up 1 hour before his appointment time and we saw him 9 minutes early.  Patient complained about having to drive to [facility] to be seen today.   Past Medical History:  Diagnosis Date   CHF (congestive heart failure) (HCC)    COPD (chronic obstructive pulmonary disease) (HCC)    Coronary artery disease    Morbid obesity (HCC)    NSTEMI (non-ST elevated myocardial infarction) ([place])    a. s/p cath in 03/2018 showing 90% OM2 stenosis and nonobstructive CAD along LAD and LCx with medical management recommended   Pneumonia    Secondary cardiomyopathy ([place])    a. EF reported as 20-25% by echo in 03/2018 b. EF at 45% by repeat imaging in 10/2018   Snoring    Type 2 diabetes mellitus ([place])      Family History  Problem Relation Age of Onset   Emphysema Father    Heart failure Father      Past Surgical History:  Procedure Laterality Date   LEFT HEART CATH AND CORONARY ANGIOGRAPHY N/A 03/30/2018   Procedure:  LEFT HEART CATH AND CORONARY ANGIOGRAPHY;  Surgeon: Troy Sine, MD;  Location: [facility];  Service: Cardiovascular;  Laterality: N/A;    Social History   Socioeconomic History   Marital status: Married    Spouse name: Not on file   Number of children: Not on file   Years of education: Not on file   Highest education level: Not on file  Occupational History   Not on file  Tobacco Use   Smoking status: Every Day    Packs/day: 1.00    Years: 55.00    Pack years: 55.00    Types: Cigarettes    Start date: 02/21/1965   Smokeless tobacco: Never   Tobacco comments:    smokes 10 cigarettes per day 08/21/20  Vaping Use   Vaping Use: Never used  Substance and Sexual Activity   Alcohol use: Not Currently   Drug use: Yes    Types: Marijuana    Comment: denies   Sexual activity: Not on file  Other Topics Concern   Not on file  Social History Narrative   Not on file   Social Determinants of Health   Financial Resource Strain: Not on file  Food Insecurity: Not on file  Transportation Needs: Not on file  Physical Activity: Not on file  Stress: Not on file  Social Connections: Not on file  Intimate Partner Violence: Not on file     Allergies  Allergen Reactions   Glipizide Palpitations   Codeine Nausea And Vomiting   Doxycycline      Outpatient Medications Prior to Visit  Medication Sig Dispense Refill   albuterol (VENTOLIN HFA) 108 (90 Base) MCG/ACT inhaler Inhale 2 puffs into the lungs every 6 (six) hours as needed for wheezing or shortness of breath.     aspirin EC 81 MG tablet Take 81 mg by mouth daily.     atorvastatin (LIPITOR) 80 MG tablet TAKE 1 TABLET (80 MG TOTAL) BY MOUTH DAILY AT 6 PM. (Patient taking differently: Take 80 mg by mouth every evening.) 90 tablet 3   canagliflozin (INVOKANA) 300 MG TABS tablet Take 300 mg by mouth daily before breakfast.     carvedilol (COREG) 12.5 MG tablet TAKE 1 TABLET (12.5 MG TOTAL) BY MOUTH 2 (TWO) TIMES DAILY. 180  tablet 3   clopidogrel (PLAVIX) 75 MG tablet TAKE ONE TABLET BY MOUTH DAILY WITH BREAKFAST 90 tablet 3   furosemide (LASIX) 20 MG tablet Take 1 tablet (20 mg total) by mouth 2 (two) times daily. 180 tablet 3   ibuprofen (ADVIL) 200 MG tablet Take 200 mg by mouth every 6 (six) hours as needed for fever or mild pain.     isosorbide mononitrate (IMDUR) 60 MG 24 hr tablet TAKE ONE TABLET BY MOUTH EVERY DAY 90 tablet 3   ketoconazole (NIZORAL) 2 % cream      metFORMIN (GLUCOPHAGE-XR) 500 MG 24 hr tablet Take 500 mg by mouth in the morning, at noon, in the evening, and at bedtime.     nitroGLYCERIN (NITROSTAT) 0.4 MG SL tablet Place 1 tablet (0.4 mg total) under the tongue every 5 (five) minutes x 3 doses as needed for chest pain (if no relief after 2nd dose, proceed to the ED for an evaluation or call 911). 25 tablet 3   omeprazole (PRILOSEC) 40 MG capsule Take 40 mg by mouth daily.     potassium chloride (KLOR-CON) 10 MEQ tablet Take 1 tablet (10 mEq total) by mouth 2 (two) times daily. 90 tablet 1   potassium chloride (KLOR-CON) 10 MEQ tablet      No facility-administered medications prior to visit.    Review of Systems  Constitutional:  Negative for chills, fever, malaise/fatigue and weight loss.  HENT:  Negative for hearing loss, sore throat and tinnitus.   Eyes:  Negative for blurred vision and double vision.  Respiratory:  Positive for shortness of breath and wheezing. Negative for cough, hemoptysis, sputum production and stridor.   Cardiovascular:  Negative for chest pain, palpitations, orthopnea, leg swelling and PND.  Gastrointestinal:  Negative for abdominal pain, constipation, diarrhea, heartburn, nausea and vomiting.  Genitourinary:  Negative for dysuria, hematuria and urgency.  Musculoskeletal:  Negative for joint pain and myalgias.  Skin:  Negative for itching and rash.  Neurological:  Negative for dizziness, tingling, weakness and headaches.  Endo/Heme/Allergies:  Negative for  environmental allergies. Does not bruise/bleed easily.  Psychiatric/Behavioral:  Negative for depression. The patient is not nervous/anxious and does not have insomnia.   All other systems reviewed and are negative.   Objective:  Physical Exam Vitals reviewed.  Constitutional:      General: He is not in acute distress.    Appearance: He is well-developed.  HENT:     Head: Normocephalic and atraumatic.  Eyes:     General: No scleral icterus.  Conjunctiva/sclera: Conjunctivae normal.     Pupils: Pupils are equal, round, and reactive to light.  Neck:     Vascular: No JVD.     Trachea: No tracheal deviation.  Cardiovascular:     Rate and Rhythm: Normal rate and regular rhythm.     Heart sounds: Normal heart sounds. No murmur heard. Pulmonary:     Effort: Pulmonary effort is normal. No tachypnea, accessory muscle usage or respiratory distress.     Breath sounds: No stridor. Wheezing present. No rhonchi or rales.     Comments: Right-sided isolated wheeze Abdominal:     General: Bowel sounds are normal. There is no distension.     Palpations: Abdomen is soft.     Tenderness: There is no abdominal tenderness.  Musculoskeletal:        General: No tenderness.     Cervical back: Neck supple.     Right lower leg: Edema present.     Left lower leg: Edema present.  Lymphadenopathy:     Cervical: No cervical adenopathy.  Skin:    General: Skin is warm and dry.     Capillary Refill: Capillary refill takes less than 2 seconds.     Findings: No rash.  Neurological:     Mental Status: He is alert and oriented to person, place, and time.  Psychiatric:        Behavior: Behavior normal.     Vitals:   10/15/20 1009  BP: 128/70  Pulse: 97  Temp: 98 F (36.7 C)  SpO2: 90%  Weight: 283 lb 9.6 oz (128.6 kg)   90% on RA BMI Readings from Last 3 Encounters:  10/15/20 41.88 kg/m  08/21/20 41.88 kg/m  08/18/20 41.94 kg/m   Wt Readings from Last 3 Encounters:  10/15/20 283 lb  9.6 oz (128.6 kg)  08/21/20 283 lb 9.6 oz (128.6 kg)  08/18/20 284 lb (128.8 kg)     CBC    Component Value Date/Time   WBC 5.8 05/03/2020 0500   RBC 6.73 (H) 05/03/2020 0500   HGB 17.0 05/03/2020 0500   HCT 58.9 (H) 05/03/2020 0500   PLT 188 05/03/2020 0500   MCV 87.5 05/03/2020 0500   MCH 25.3 (L) 05/03/2020 0500   MCHC 28.9 (L) 05/03/2020 0500   RDW 17.2 (H) 05/03/2020 0500    Chest Imaging:  10/07/2020 lung cancer screening CT: Right lower lobe collapse, possible endobronchial lesion, concern for obstructing mass. The patient's images have been independently reviewed by me.    Pulmonary Functions Testing Results: No flowsheet data found.  FeNO:   Pathology:   Echocardiogram:   Heart Catheterization:     Assessment & Plan:     ICD-10-CM   1. 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 Lung collapse  J98.19 AMB REFERRAL FOR DME    albuterol (PROVENTIL) (2.5 MG/3ML) 0.083% nebulizer solution    3. Wheezing  R06.2     4. COPD GOLD ?  /  active smoker  J44.9       Discussion: 65 year old morbidly obese gentleman, abnormal lung cancer screening CT with a right lower lobe collapse, is possibly centrally obstructing lesion.  He has longstanding history of smoking since age 19 years old.  He has a chronic oxygen dependent respiratory failure.  We also discussed that his CT  scan could just represent a benign etiology such as mucous plugging within the right.  Today does not appear to have any significant infectious symptoms and he has a persistent right-sided wheeze in the chest on exam.  Plan: I am concerned for a right-sided centralized obstructing lesion due to patient's history. Additionally he has chest x-ray from January 2022 which also shows partial right lower lobe collapse. He  had not had any other recent axial CT imaging.  We reviewed CT imaging today in the office.  The patient is seemingly very concerned that he could have a cancer. He is also relatively upset and angry about the whole situation. We spent most of the time in the office today attempting to de-escalate the situation. Initially the patient did not want to have anything done whatsoever however his wife was insistent that he should at least consider having a bronchoscopy for further evaluation to see if we are dealing with a malignancy or if this is benign etiology.  Therefore today in the office we discussed the risk benefits and alternatives of consideration of bronchoscopy. I explained that I would not consider doing a bronchoscopy if he was not willing to have biopsy if this was a malignancy. He at least agreed to have the bronchoscopy done. He is on Plavix and this will need to be held prior to the procedure.  Because the patient has a potential centrally obstructing right mainstem mass and would require cryotherapy with tumor debulking I would recommend the patient to be off of his Plavix for at least 7 days before the case.  We will work with scheduling to try to get his case done as soon as possible.  If, the patient decides to cancel this procedure or not show up to any other further appointments he will need to seek care at a different pulmonary practice.  Patient was also counseled on appropriate behavior in the office and how to speak to staff.   Current Outpatient Medications:    albuterol (VENTOLIN HFA) 108 (90 Base) MCG/ACT inhaler, Inhale 2 puffs into the lungs every 6 (six) hours as needed for wheezing or shortness of breath., Disp: , Rfl:    aspirin EC 81 MG tablet, Take 81 mg by mouth daily., Disp: , Rfl:    atorvastatin (LIPITOR) 80 MG tablet, TAKE 1 TABLET (80 MG TOTAL) BY MOUTH DAILY AT 6 PM. (Patient taking differently: Take 80 mg by mouth every evening.), Disp: 90 tablet, Rfl:  3   canagliflozin (INVOKANA) 300 MG TABS tablet, Take 300 mg by mouth daily before breakfast., Disp: , Rfl:    carvedilol (COREG) 12.5 MG tablet, TAKE 1 TABLET (12.5 MG TOTAL) BY MOUTH 2 (TWO) TIMES DAILY., Disp: 180 tablet, Rfl: 3   clopidogrel (PLAVIX) 75 MG tablet, TAKE ONE TABLET BY MOUTH DAILY WITH BREAKFAST, Disp: 90 tablet, Rfl: 3   furosemide (LASIX) 20 MG tablet, Take 1 tablet (20 mg total) by mouth 2 (two) times daily., Disp: 180 tablet, Rfl: 3   ibuprofen (ADVIL) 200 MG tablet, Take 200 mg by mouth every 6 (six) hours as needed for fever or mild pain., Disp: , Rfl:    isosorbide mononitrate (IMDUR) 60 MG 24 hr tablet, TAKE ONE TABLET BY MOUTH EVERY DAY, Disp: 90 tablet, Rfl: 3   ketoconazole (NIZORAL) 2 % cream, , Disp: , Rfl:    metFORMIN (GLUCOPHAGE-XR) 500 MG 24 hr tablet, Take 500 mg by mouth in the morning, at noon, in the evening, and at bedtime., Disp: ,  Rfl:    nitroGLYCERIN (NITROSTAT) 0.4 MG SL tablet, Place 1 tablet (0.4 mg total) under the tongue every 5 (five) minutes x 3 doses as needed for chest pain (if no relief after 2nd dose, proceed to the ED for an evaluation or call 911)., Disp: 25 tablet, Rfl: 3   omeprazole (PRILOSEC) 40 MG capsule, Take 40 mg by mouth daily., Disp: , Rfl:    potassium chloride (KLOR-CON) 10 MEQ tablet, Take 1 tablet (10 mEq total) by mouth 2 (two) times daily., Disp: 90 tablet, Rfl: 1   potassium chloride (KLOR-CON) 10 MEQ tablet, , Disp: , Rfl:   I spent 62 minutes dedicated to the care of this patient on the date of this encounter to include pre-visit review of records, face-to-face time with the patient discussing conditions above, post visit ordering of testing, clinical documentation with the electronic health record, making appropriate referrals as documented, and communicating necessary findings to members of the patients care team.   Garner Nash, [facility] 10/15/2020 11:38 AM

## 2020-10-15 NOTE — Telephone Encounter (Signed)
R/S for 7/15 at 7:30am; 5:30am arrival time. Called and informed pt.

## 2020-10-26 ENCOUNTER — Other Ambulatory Visit (HOSPITAL_COMMUNITY): Payer: Medicare HMO

## 2020-10-28 ENCOUNTER — Encounter (HOSPITAL_COMMUNITY): Payer: Self-pay | Admitting: Pulmonary Disease

## 2020-10-28 ENCOUNTER — Telehealth: Payer: Self-pay | Admitting: Pulmonary Disease

## 2020-10-28 NOTE — Telephone Encounter (Signed)
Pt stated that they were previously informed that they would be released from the hospital but now they received a call from the hospital today stating that they are going to be staying overnight and he wanted to verify this with Dr. Valeta Harms will route to triage and Dr. Valeta Harms being his procedure is Friday 10/30/2020. Thank you.  Pls regard; [phone]

## 2020-10-28 NOTE — Telephone Encounter (Signed)
Called and spoke with pt who states he received a call from the hospital about his upcoming procedure and was told by the hospital that he would be staying overnight for the procedure. Pt stated he received a letter in the mail from our office that had originally said that he would need to have a ride to take him home after the procedure the day of.  Due to the information he just found out from the hospital, they told him to call our office to clarify this.  Dr. Valeta Harms, please advise.

## 2020-10-28 NOTE — Telephone Encounter (Signed)
PCCM  Can someone please call the patient and make sure that they stopped with her Plavix prior to the procedure like they were told to do during last office visit?  Garner Nash, DO [facility] 10/28/2020 5:35 PM

## 2020-10-28 NOTE — Telephone Encounter (Signed)
Icard, Octavio Graves, DO  Ella Golomb, Waldemar Dickens, CMA; [facility] Triage Pool; Amado Coe, CMA Caller: Unspecified (Today,  4:15 PM) No I have no plans for him to stay overnight.  Unless there were any issues after the procedure.  He should be discharged home following the case.   [facility]  10/28/2020 4:35 PM    Called and spoke with pt letting him know the info stated by Dr. Valeta Harms and he verbalized understanding. Nothing further needed.

## 2020-10-28 NOTE — Progress Notes (Signed)
DUE TO COVID-19 ONLY ONE VISITOR IS ALLOWED TO COME WITH YOU AND STAY IN THE WAITING ROOM ONLY DURING PRE OP AND PROCEDURE DAY OF SURGERY. Two  VISITORS  MAY VISIT WITH YOU AFTER SURGERY IN YOUR PRIVATE ROOM DURING VISITING HOURS ONLY!  PCP - Dr Leonie Douglas Cardiologist - Dr Rozann Lesches  CT Chest x-ray - 10/07/20 EKG - 04/28/20 Stress Test - 2011 [place] ECHO - 04/28/20 Cardiac Cath - 03/30/18  Sleep Study -  n/a CPAP - none  Fasting Blood Sugar - 120s-140s Checks Blood Sugar 1 times a day  Do not take Invokana Thurs night or Fri morning DOS.  Do not take Metformin on DOS.  If your blood sugar is less than 70 mg/dL, you will need to treat for low blood sugar: Treat a low blood sugar (less than 70 mg/dL) with  cup of clear juice (cranberry or apple), 4 glucose tablets, OR glucose gel. Recheck blood sugar in 15 minutes after treatment (to make sure it is greater than 70 mg/dL). If your blood sugar is not greater than 70 mg/dL on recheck, call [phone] for further instructions.  Blood Thinner Instructions:  Last dose of Plavix was on 10/22/20 per patient.  Aspirin Instructions: Follow your surgeon's instructions on when to stop aspirin prior to surgery.  If no instructions were given by your surgeon then you will need to call the office for those instructions.  Anesthesia review: Yes  STOP now taking any Aspirin (unless otherwise instructed by your surgeon), Aleve, Naproxen, Ibuprofen, Motrin, Advil, Goody's, BC's, all herbal medications, fish oil, and all vitamins.   Coronavirus Screening Covid test is scheduled on DOS. Do you have any of the following symptoms:  Cough yes/no: No Fever (>100.55F)  yes/no: No Runny nose yes/no: No Sore throat yes/no: No Difficulty breathing/shortness of breath  yes/no: No  Have you traveled in the last 14 days and where? yes/no: No  Patient verbalized understanding of instructions that were given via phone.

## 2020-10-29 ENCOUNTER — Telehealth: Payer: Self-pay | Admitting: Primary Care

## 2020-10-29 NOTE — Telephone Encounter (Signed)
Call made to patient, confirmed DOB. Made aware of BI recommendations. He stated he stopped taking the plavix last week. He wanted to know if he needs to stop taking his Aspirin as well.   BI please advise. Thanks :)

## 2020-10-29 NOTE — Telephone Encounter (Signed)
Called and spoke with patient. Patient states that someone called him at 945 this morning. There wasn't any documentation of a phone call. Patient just wanted to make sure his procedure was still on for tomorrow. Advised him that it was.  Nothing further needed at this time.

## 2020-10-29 NOTE — Anesthesia Preprocedure Evaluation (Addendum)
Anesthesia Evaluation  Patient identified by MRN, date of birth, ID band Patient awake    Reviewed: Allergy & Precautions, NPO status , Patient's Chart, lab work & pertinent test results, reviewed documented beta blocker date and time   Airway Mallampati: IV  TM Distance: >3 FB Neck ROM: Full  Mouth opening: Limited Mouth Opening  Dental  (+) Poor Dentition, Missing, Dental Advisory Given,    Pulmonary shortness of breath, COPD (3L O2 at night and during day as needed),  COPD inhaler and oxygen dependent, Current Smoker,    Pulmonary exam normal breath sounds clear to auscultation       Cardiovascular hypertension, + CAD, + Past MI and +CHF  Normal cardiovascular exam Rhythm:Regular Rate:Normal     Neuro/Psych negative neurological ROS  negative psych ROS   GI/Hepatic Neg liver ROS, GERD  Medicated and Controlled,  Endo/Other  diabetes, Type 2, Oral Hypoglycemic AgentsMorbid obesity (BMI 41)  Renal/GU negative Renal ROS  negative genitourinary   Musculoskeletal negative musculoskeletal ROS (+)   Abdominal   Peds  Hematology  (+) Blood dyscrasia (on plavix), ,   Anesthesia Other Findings CT chest lung cancer screening 10/07/2020: IMPRESSION: 1. Lung-RADS 4B, suspicious. Additional imaging evaluation or consultation with Pulmonology or Thoracic Surgery recommended. Right middle and lower lobe collapse associated with moderate right pleural effusion. Occlusion of the bronchus intermedius evident. Central obstructing mass lesion a concern. CT chest with contrast recommended to further evaluate. These changes have been slowly progressive since 12/19/2018 and if neoplastic etiology has already been excluded, then given the degree of collapse/consolidation in the right lung, this patient is not a candidate for lung cancer screening. 2. Enlargement of the pulmonary outflow tract and main pulmonary arteries suggests  pulmonary arterial hypertension. 3. Aortic Atherosclerosis (ICD10-I70.0).  TTE 04/28/2020: 1. Left ventricular ejection fraction, by estimation, is 55 to 60%. The  left ventricle has normal function. The left ventricle has no regional  wall motion abnormalities. Left ventricular diastolic parameters were  normal.  2. Right ventricular systolic function is normal. The right ventricular  size is normal.  3. The mitral valve is normal in structure. No evidence of mitral valve  regurgitation. No evidence of mitral stenosis.  4. The aortic valve was not well visualized.  5. The inferior vena cava is normal in size with greater than 50%  respiratory variability, suggesting right atrial pressure of 3 mmHg.  Cath 03/30/2018: Moderate global LV dysfunction with an ejection fraction of 35 to 40%.  LVEDP 13 mm.  There is evidence for coronary calcification.  The LAD has 30% proximal stenosis.  The Circumflex vessel has mild 30% narrowing prior to giving off 3 marginal vessels and the AV groove circumflex.  The second marginal branch has diffuse disease with 90% near ostial to proximal stenosis and diffuse 90% mid stenoses.  The RCA is a normal dominant vessel.  RECOMMENDATION: Due to the patient's large body habitus, visualization of vessels was difficult.  Recommend an initial aggressive medical therapy regimen with high potency statin therapy with target LDL less than 70, beta-blocker, amlodipine, in addition to nitrate therapy.  Since cardiac enzymes are mildly positive consistent with a NSTEMI recommend initial dual antiplatelet therapy with aspirin/Plavix even though stenting was not performed.  If patient fails medical therapy consider PCI to the diffusely diseased second marginal branch of the circumflex.  Smoking cessation is imperative  Reproductive/Obstetrics  Anesthesia Physical Anesthesia Plan  ASA: 3  Anesthesia Plan:  General   Post-op Pain Management:    Induction: Intravenous  PONV Risk Score and Plan: 1 and Midazolam, Dexamethasone and Ondansetron  Airway Management Planned: Oral ETT  Additional Equipment:   Intra-op Plan:   Post-operative Plan: Extubation in OR  Informed Consent: I have reviewed the patients History and Physical, chart, labs and discussed the procedure including the risks, benefits and alternatives for the proposed anesthesia with the patient or authorized representative who has indicated his/her understanding and acceptance.     Dental advisory given  Plan Discussed with: CRNA  Anesthesia Plan Comments:        Anesthesia Quick Evaluation

## 2020-10-29 NOTE — Telephone Encounter (Signed)
Call made to patient, confirmed DOB. Patient made aware he can continue his oxygen.   Nothing further needed at this time.

## 2020-10-29 NOTE — Progress Notes (Signed)
Anesthesia Chart Review: Same-day work-up  Follows with pulmonology for history of COPD and chronic hypercarbic respiratory failure.  Patient continues to smoke.  He is maintained on 3 L submental oxygen at night and as needed daytime with goal >86% since hypercarbic.  No improvement with Breo Ellipta or Trelegy, maintained on as needed saba only.  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  Most recent LVEF normalized, 55 to 60%, as of echo January 2022.  He is on aspirin, Lipitor, Plavix, Coreg, Imdur, and as needed nitroglycerin.  Last seen by Dr. Domenic Polite 08/18/2020, no angina at that time.  No changes made to medical management.  71-month follow-up recommended.  Patient reported last dose Plavix 10/22/2020.  Uncontrolled DM2, last A1c 8.8 on 04/27/2020.  Patient will need day of surgery labs and evaluation.  EKG 04/28/2020: Sinus tachycardia.  Rate 101.  Low voltage QRS.  Anterolateral infarct, age undetermined.  No significant changes laceration.  CT chest lung cancer screening 10/07/2020: IMPRESSION: 1. Lung-RADS 4B, suspicious. Additional imaging evaluation or consultation with Pulmonology or Thoracic Surgery recommended. Right middle and lower lobe collapse associated with moderate right pleural effusion. Occlusion of the bronchus intermedius evident. Central obstructing mass lesion a concern. CT chest with contrast recommended to further evaluate. These changes have been slowly progressive since 12/19/2018 and if neoplastic etiology has already been excluded, then given the degree of collapse/consolidation in the right lung, this patient is not a candidate for lung cancer screening. 2. Enlargement of the pulmonary outflow tract and main  pulmonary arteries suggests pulmonary arterial hypertension. 3. Aortic Atherosclerosis (ICD10-I70.0).   TTE 04/28/2020:  1. Left ventricular ejection fraction, by estimation, is 55 to 60%. The  left ventricle has normal function. The left ventricle has no regional  wall motion abnormalities. Left ventricular diastolic parameters were  normal.   2. Right ventricular systolic function is normal. The right ventricular  size is normal.   3. The mitral valve is normal in structure. No evidence of mitral valve  regurgitation. No evidence of mitral stenosis.   4. The aortic valve was not well visualized.   5. The inferior vena cava is normal in size with greater than 50%  respiratory variability, suggesting right atrial pressure of 3 mmHg.   Cath 03/30/2018: Moderate global LV dysfunction with an ejection fraction of 35 to 40%.  LVEDP 13 mm.   There is evidence for coronary calcification.  The LAD has 30% proximal stenosis.   The Circumflex vessel has mild 30% narrowing prior to giving off 3 marginal vessels and the AV groove circumflex.  The second marginal branch has diffuse disease with 90% near ostial to proximal stenosis and diffuse 90% mid stenoses.   The RCA is a normal dominant vessel.   RECOMMENDATION: Due to the patient's large body habitus, visualization of vessels was difficult.  Recommend an initial aggressive medical therapy regimen with high potency statin therapy with target LDL less than 70, beta-blocker, amlodipine, in addition to nitrate therapy.  Since cardiac enzymes are mildly positive consistent with a NSTEMI recommend initial dual antiplatelet therapy with aspirin/Plavix even though stenting was not performed.  If patient fails medical therapy consider PCI to the diffusely diseased second marginal branch of the circumflex.  Smoking cessation is imperative.    Karoline Caldwell, PA-C [facility]/Anesthesiology Phone [phone])  [phone] 10/29/2020 11:02 AM

## 2020-10-29 NOTE — Telephone Encounter (Signed)
ATC Patient.  LM to call back. 

## 2020-10-30 ENCOUNTER — Ambulatory Visit (HOSPITAL_COMMUNITY): Payer: Medicare HMO | Admitting: Physician Assistant

## 2020-10-30 ENCOUNTER — Encounter (HOSPITAL_COMMUNITY): Payer: Self-pay | Admitting: Pulmonary Disease

## 2020-10-30 ENCOUNTER — Encounter (HOSPITAL_COMMUNITY)
Admission: RE | Disposition: A | Discharge status: Home / Self Care | Payer: Self-pay | Source: Home / Self Care | Attending: Pulmonary Disease

## 2020-10-30 ENCOUNTER — Ambulatory Visit (HOSPITAL_COMMUNITY)
Admission: RE | Admit: 2020-10-30 | Discharge: 2020-10-30 | Disposition: A | Discharge status: Home / Self Care | Payer: Medicare HMO | Attending: Pulmonary Disease | Admitting: Pulmonary Disease

## 2020-10-30 DIAGNOSIS — Z20822 Contact with and (suspected) exposure to covid-19: Secondary | ICD-10-CM | POA: Diagnosis not present

## 2020-10-30 DIAGNOSIS — Z7982 Long term (current) use of aspirin: Secondary | ICD-10-CM | POA: Insufficient documentation

## 2020-10-30 DIAGNOSIS — I252 Old myocardial infarction: Secondary | ICD-10-CM | POA: Diagnosis not present

## 2020-10-30 DIAGNOSIS — Z7984 Long term (current) use of oral hypoglycemic drugs: Secondary | ICD-10-CM | POA: Diagnosis not present

## 2020-10-30 DIAGNOSIS — I429 Cardiomyopathy, unspecified: Secondary | ICD-10-CM | POA: Insufficient documentation

## 2020-10-30 DIAGNOSIS — I2510 Atherosclerotic heart disease of native coronary artery without angina pectoris: Secondary | ICD-10-CM | POA: Diagnosis not present

## 2020-10-30 DIAGNOSIS — Z9981 Dependence on supplemental oxygen: Secondary | ICD-10-CM | POA: Diagnosis not present

## 2020-10-30 DIAGNOSIS — J9612 Chronic respiratory failure with hypercapnia: Secondary | ICD-10-CM | POA: Diagnosis not present

## 2020-10-30 DIAGNOSIS — C3431 Malignant neoplasm of lower lobe, right bronchus or lung: Secondary | ICD-10-CM | POA: Insufficient documentation

## 2020-10-30 DIAGNOSIS — Z7902 Long term (current) use of antithrombotics/antiplatelets: Secondary | ICD-10-CM | POA: Diagnosis not present

## 2020-10-30 DIAGNOSIS — F17210 Nicotine dependence, cigarettes, uncomplicated: Secondary | ICD-10-CM | POA: Insufficient documentation

## 2020-10-30 DIAGNOSIS — I509 Heart failure, unspecified: Secondary | ICD-10-CM | POA: Insufficient documentation

## 2020-10-30 DIAGNOSIS — Z79899 Other long term (current) drug therapy: Secondary | ICD-10-CM | POA: Diagnosis not present

## 2020-10-30 DIAGNOSIS — J449 Chronic obstructive pulmonary disease, unspecified: Secondary | ICD-10-CM | POA: Insufficient documentation

## 2020-10-30 DIAGNOSIS — Z6841 Body Mass Index (BMI) 40.0 and over, adult: Secondary | ICD-10-CM | POA: Diagnosis not present

## 2020-10-30 DIAGNOSIS — J9819 Other pulmonary collapse: Secondary | ICD-10-CM | POA: Diagnosis present

## 2020-10-30 DIAGNOSIS — R918 Other nonspecific abnormal finding of lung field: Secondary | ICD-10-CM | POA: Diagnosis not present

## 2020-10-30 HISTORY — PX: VIDEO BRONCHOSCOPY: SHX5072

## 2020-10-30 HISTORY — DX: Enthesopathy, unspecified: M77.9

## 2020-10-30 HISTORY — DX: Myoneural disorder, unspecified: G70.9

## 2020-10-30 HISTORY — PX: BRONCHIAL WASHINGS: SHX5105

## 2020-10-30 HISTORY — DX: Gastro-esophageal reflux disease without esophagitis: K21.9

## 2020-10-30 HISTORY — PX: ENDOBRONCHIAL ULTRASOUND: SHX5096

## 2020-10-30 HISTORY — PX: BRONCHIAL BIOPSY: SHX5109

## 2020-10-30 HISTORY — PX: BRONCHIAL BRUSHINGS: SHX5108

## 2020-10-30 HISTORY — DX: Fatty (change of) liver, not elsewhere classified: K76.0

## 2020-10-30 HISTORY — DX: Dyspnea, unspecified: R06.00

## 2020-10-30 HISTORY — DX: Unspecified osteoarthritis, unspecified site: M19.90

## 2020-10-30 LAB — CBC
HCT: 55 % — ABNORMAL HIGH (ref 39.0–52.0)
Hemoglobin: 16.5 g/dL (ref 13.0–17.0)
MCH: 28 pg (ref 26.0–34.0)
MCHC: 30 g/dL (ref 30.0–36.0)
MCV: 93.2 fL (ref 80.0–100.0)
Platelets: 224 10*3/uL (ref 150–400)
RBC: 5.9 MIL/uL — ABNORMAL HIGH (ref 4.22–5.81)
RDW: 14.4 % (ref 11.5–15.5)
WBC: 6.9 10*3/uL (ref 4.0–10.5)
nRBC: 0 % (ref 0.0–0.2)

## 2020-10-30 LAB — BASIC METABOLIC PANEL
Anion gap: 6 (ref 5–15)
BUN: 15 mg/dL (ref 8–23)
CO2: 34 mmol/L — ABNORMAL HIGH (ref 22–32)
Calcium: 9 mg/dL (ref 8.9–10.3)
Chloride: 100 mmol/L (ref 98–111)
Creatinine, Ser: 0.55 mg/dL — ABNORMAL LOW (ref 0.61–1.24)
GFR, Estimated: >60 mL/min (ref >60)
Glucose, Bld: 166 mg/dL — ABNORMAL HIGH (ref 70–99)
Potassium: 4.1 mmol/L (ref 3.5–5.1)
Sodium: 140 mmol/L (ref 135–145)

## 2020-10-30 LAB — GLUCOSE, CAPILLARY
Glucose-Capillary: 197 mg/dL — ABNORMAL HIGH (ref 70–99)
Glucose-Capillary: 151 mg/dL — ABNORMAL HIGH (ref 70–99)
Glucose-Capillary: 140 mg/dL — ABNORMAL HIGH (ref 70–99)

## 2020-10-30 LAB — SARS CORONAVIRUS 2 BY RT PCR (HOSPITAL ORDER, PERFORMED IN ~~LOC~~ HOSPITAL LAB): SARS Coronavirus 2: NEGATIVE

## 2020-10-30 SURGERY — VIDEO BRONCHOSCOPY WITHOUT FLUORO
Anesthesia: General | Laterality: Right

## 2020-10-30 MED ORDER — ESMOLOL HCL 100 MG/10ML IV SOLN
INTRAVENOUS | Status: DC | PRN
  Administered 2020-10-30: 30 mg via INTRAVENOUS
  Administered 2020-10-30: 20 mg via INTRAVENOUS

## 2020-10-30 MED ORDER — LIDOCAINE 2% (20 MG/ML) 5 ML SYRINGE
INTRAMUSCULAR | Status: DC | PRN
  Administered 2020-10-30: 100 mg via INTRAVENOUS

## 2020-10-30 MED ORDER — CHLORHEXIDINE GLUCONATE 0.12 % MT SOLN: 15.0000 mL | Freq: Once | OROMUCOSAL | Status: AC

## 2020-10-30 MED ORDER — EPHEDRINE SULFATE-NACL 50-0.9 MG/10ML-% IV SOSY
PREFILLED_SYRINGE | INTRAVENOUS | Status: DC | PRN
  Administered 2020-10-30: 5 mg via INTRAVENOUS

## 2020-10-30 MED ORDER — ONDANSETRON HCL 4 MG/2ML IJ SOLN
INTRAMUSCULAR | Status: DC | PRN
  Administered 2020-10-30: 4 mg via INTRAVENOUS

## 2020-10-30 MED ORDER — LACTATED RINGERS IV SOLN: INTRAVENOUS | Status: DC

## 2020-10-30 MED ORDER — SUGAMMADEX SODIUM 200 MG/2ML IV SOLN
INTRAVENOUS | Status: DC | PRN
  Administered 2020-10-30: 500 mg via INTRAVENOUS

## 2020-10-30 MED ORDER — FENTANYL CITRATE (PF) 100 MCG/2ML IJ SOLN: 25.0000 ug | INTRAMUSCULAR | Status: DC | PRN

## 2020-10-30 MED ORDER — ROCURONIUM BROMIDE 10 MG/ML (PF) SYRINGE
PREFILLED_SYRINGE | INTRAVENOUS | Status: DC | PRN
  Administered 2020-10-30: 100 mg via INTRAVENOUS

## 2020-10-30 MED ORDER — FENTANYL CITRATE (PF) 250 MCG/5ML IJ SOLN
INTRAMUSCULAR | Status: DC | PRN
  Administered 2020-10-30: 50 ug via INTRAVENOUS

## 2020-10-30 MED ORDER — PHENYLEPHRINE 40 MCG/ML (10ML) SYRINGE FOR IV PUSH (FOR BLOOD PRESSURE SUPPORT)
PREFILLED_SYRINGE | INTRAVENOUS | Status: DC | PRN
  Administered 2020-10-30: 200 ug via INTRAVENOUS
  Administered 2020-10-30 (×2): 80 ug via INTRAVENOUS

## 2020-10-30 MED ORDER — PROPOFOL 10 MG/ML IV BOLUS
INTRAVENOUS | Status: DC | PRN
  Administered 2020-10-30: 200 mg via INTRAVENOUS

## 2020-10-30 MED ORDER — MIDAZOLAM HCL 5 MG/5ML IJ SOLN
INTRAMUSCULAR | Status: DC | PRN
  Administered 2020-10-30: 2 mg via INTRAVENOUS

## 2020-10-30 MED ORDER — LACTATED RINGERS IV SOLN: INTRAVENOUS | Status: DC | PRN

## 2020-10-30 MED ORDER — PHENYLEPHRINE HCL-NACL 10-0.9 MG/250ML-% IV SOLN
INTRAVENOUS | Status: DC | PRN
  Administered 2020-10-30: 50 ug/min via INTRAVENOUS
  Administered 2020-10-30: 35 ug/min via INTRAVENOUS

## 2020-10-30 MED ORDER — CHLORHEXIDINE GLUCONATE 0.12 % MT SOLN
OROMUCOSAL | Status: AC
  Administered 2020-10-30: 15 mL via OROMUCOSAL
  Filled 2020-10-30: qty 15

## 2020-10-30 MED ORDER — DEXAMETHASONE SODIUM PHOSPHATE 10 MG/ML IJ SOLN
INTRAMUSCULAR | Status: DC | PRN
  Administered 2020-10-30: 5 mg via INTRAVENOUS

## 2020-10-30 NOTE — Interval H&P Note (Signed)
History and Physical Interval Note:  10/30/2020 7:28 AM  Thomas [name]  has presented today for surgery, with the diagnosis of endobronchial lesion.  The various methods of treatment have been discussed with the patient and family. After consideration of risks, benefits and other options for treatment, the patient has consented to  Procedure(s) with comments: VIDEO BRONCHOSCOPY WITHOUT FLUORO (Right) - possible cryotherapy as a surgical intervention.  The patient's history has been reviewed, patient examined, no change in status, stable for surgery.  I have reviewed the patient's chart and labs.  Questions were answered to the patient's satisfaction.     [place]

## 2020-10-30 NOTE — Anesthesia Procedure Notes (Signed)
Procedure Name: Intubation Date/Time: 10/30/2020 9:24 AM Performed by: Gaylene Brooks, CRNA Pre-anesthesia Checklist: Patient identified, Emergency Drugs available, Suction available and Patient being monitored Patient Re-evaluated:Patient Re-evaluated prior to induction Oxygen Delivery Method: Circle System Utilized Preoxygenation: Pre-oxygenation with 100% oxygen Induction Type: IV induction Ventilation: Mask ventilation without difficulty, Oral airway inserted - appropriate to patient size and Two handed mask ventilation required Laryngoscope Size: Glidescope and 4 Grade View: Grade I Tube type: Oral Tube size: 8.5 mm Number of attempts: 1 Airway Equipment and Method: Stylet and Oral airway Placement Confirmation: ETT inserted through vocal cords under direct vision, positive ETCO2 and breath sounds checked- equal and bilateral Secured at: 25 cm Tube secured with: Tape Dental Injury: Teeth and Oropharynx as per pre-operative assessment  Difficulty Due To: Difficulty was anticipated, Difficult Airway- due to large tongue and Difficult Airway- due to dentition Comments: Glidescope used electively due to pt's large neck, tongue, poor dentition. Glidescope used without difficulty and would recommend its usage in the future.

## 2020-10-30 NOTE — Discharge Instructions (Signed)
Flexible Bronchoscopy, Care After This sheet gives you information about how to care for yourself after your test. Your doctor may also give you more specific instructions. If you have problems or questions, contact your doctor. Follow these instructions at home: Eating and drinking Do not eat or drink anything (not even water) for 2 hours after your test, or until your numbing medicine (local anesthetic) wears off. When your numbness is gone and your cough and gag reflexes have come back, you may: Eat only soft foods. Slowly drink liquids. The day after the test, go back to your normal diet. Driving Do not drive for 24 hours if you were given a medicine to help you relax (sedative). Do not drive or use heavy machinery while taking prescription pain medicine. General instructions  Take over-the-counter and prescription medicines only as told by your doctor. Return to your normal activities as told. Ask what activities are safe for you. Do not use any products that have nicotine or tobacco in them. This includes cigarettes and e-cigarettes. If you need help quitting, ask your doctor. Keep all follow-up visits as told by your doctor. This is important. It is very important if you had a tissue sample (biopsy) taken. Get help right away if: You have shortness of breath that gets worse. You get light-headed. You feel like you are going to pass out (faint). You have chest pain. You cough up: More than a little blood. More blood than before. Summary Do not eat or drink anything (not even water) for 2 hours after your test, or until your numbing medicine wears off. Do not use cigarettes. Do not use e-cigarettes. Get help right away if you have chest pain.  This information is not intended to replace advice given to you by your health care provider. Make sure you discuss any questions you have with your health care provider. Document Released: 01/30/2009 Document Revised: 03/17/2017 Document  Reviewed: 04/22/2016 Elsevier Patient Education  2020 [employer].

## 2020-10-30 NOTE — Progress Notes (Signed)
Confirmed with Dr. Lanetta Inch, ok to d/c home on 2L O2. Patient has personal O2 tank and wears 2L at night. Patient's O2 98% on 2L.   Patient and wife aware, patient is to continue to wear O2 for the remainder of the day and keep O2 >88%.

## 2020-10-30 NOTE — Op Note (Signed)
Video Bronchoscopy Procedure Note  Date of Operation: 10/30/2020  Pre-op Diagnosis: Collapsed RLL  Post-op Diagnosis: Endobronchial mass, RLL superior segment   Surgeon: Octavio Graves Hershey Knauer,DO   Assistants: none  Anesthesia: General anesthesia   Operation: Flexible video fiberoptic bronchoscopy and biopsies.  Estimated Blood Loss: <1RZ  Complications: none noted  Indications and History: Thomas [name] is 65 y.o. with history of RLL collapse, and smoking.  Recommendation was to perform video fiberoptic bronchoscopy with biopsies. The risks, benefits, complications, treatment options and expected outcomes were discussed with the patient.  The possibilities of pneumothorax, pneumonia, reaction to medication, pulmonary aspiration, perforation of a viscus, bleeding, failure to diagnose a condition and creating a complication requiring transfusion or operation were discussed with the patient who freely signed the consent.    Description of Procedure: The patient was seen in the Preoperative Area, was examined and was deemed appropriate to proceed.  The patient was taken to [facility] endoscopy room 2, identified as Thomas [name] and the procedure verified as Flexible Video Fiberoptic Bronchoscopy.  A Time Out was held and the above information confirmed.   Therapeutic bronchoscope was inserted through the endotracheal tube and a general inspection was performed which showed normal cords, normal trachea, normal main carina. The R sided airways were inspected and showed normal RUL, BI, RML and the right lower lobe superior segment revealed a small endobronchial mass arising from the opening. The L side was then inspected. The LLL, Lingular and LUL airways were normal.   Using the standard therapeutic bronchoscope, and 2.8 mm [employer] forceps biopsies were taken from the right lower lobe superior segment opening and visible endobronchial tumor.  We also completed cytology brushings to the right  lower lobe superior segment as well as BAL specimen to be sent for cytology to the right lower lobe.  Following biopsies we switched to the Olympus endobronchial ultrasound scope.  We were able to visualize the right lower lobe area of collapsed lung.  There was no clear identified mass under ultrasound.  I suspect that the lesion that we are seeing just arises from the opening of the right lower lobe superior segment.  Due to the area of atelectasis around the lesion was difficult to identify a discrete mass.  No samples were taken under ultrasound guidance.  Samples: 1. Endobronchial brushings from right lower lobe 2. Endobronchial forceps biopsies from right lower lobe 3. Bronchial washings from right lower lobe  Plans:  We will review the cytology, pathology and microbiology results with the patient when they become available.  Outpatient followup will be with Dr. Valeta Harms.   [facility] 10/30/2020 10:13 AM

## 2020-10-30 NOTE — Transfer of Care (Signed)
Immediate Anesthesia Transfer of Care Note  Patient: Thomas [name]  Procedure(s) Performed: VIDEO BRONCHOSCOPY WITHOUT FLUORO (Right) BRONCHIAL BIOPSIES BRONCHIAL WASHINGS BRONCHIAL BRUSHINGS ENDOBRONCHIAL ULTRASOUND  Patient Location: PACU  Anesthesia Type:General  Level of Consciousness: awake, alert  and oriented  Airway & Oxygen Therapy: Patient Spontanous Breathing and Patient connected to face mask oxygen  Post-op Assessment: Report given to RN and Post -op Vital signs reviewed and stable  Post vital signs: Reviewed and stable  Last Vitals:  Vitals Value Taken Time  BP 162/98 10/30/20 1031  Temp    Pulse 107 10/30/20 1038  Resp 20 10/30/20 1038  SpO2 89 % 10/30/20 1038  Vitals shown include unvalidated device data.  Last Pain:  Vitals:   10/30/20 0617  TempSrc:   PainSc: 0-No pain         Complications: No notable events documented.

## 2020-10-30 NOTE — Anesthesia Postprocedure Evaluation (Signed)
Anesthesia Post Note  Patient: Thomas [name]  Procedure(s) Performed: VIDEO BRONCHOSCOPY WITHOUT FLUORO (Right) BRONCHIAL BIOPSIES BRONCHIAL WASHINGS BRONCHIAL BRUSHINGS ENDOBRONCHIAL ULTRASOUND     Patient location during evaluation: PACU Anesthesia Type: General Level of consciousness: awake and alert Pain management: pain level controlled Vital Signs Assessment: post-procedure vital signs reviewed and stable Respiratory status: spontaneous breathing, nonlabored ventilation, respiratory function stable and patient connected to nasal cannula oxygen Cardiovascular status: blood pressure returned to baseline and stable Postop Assessment: no apparent nausea or vomiting Anesthetic complications: no   No notable events documented.  Last Vitals:  Vitals:   10/30/20 1045 10/30/20 1100  BP: (!) 148/78 (!) 144/78  Pulse: (!) 101 (!) 101  Resp: 19 19  Temp:  36.6 C  SpO2: 97% 93%    Last Pain:  Vitals:   10/30/20 1100  TempSrc:   PainSc: 0-No pain                 Ramon Zanders L Adrick Kestler

## 2020-11-02 ENCOUNTER — Telehealth: Payer: Self-pay | Admitting: Acute Care

## 2020-11-02 DIAGNOSIS — C3491 Malignant neoplasm of unspecified part of right bronchus or lung: Secondary | ICD-10-CM

## 2020-11-02 LAB — CYTOLOGY - NON PAP

## 2020-11-02 NOTE — Telephone Encounter (Signed)
I have attempted to call the patient with th results of his biopsy. 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 The additional biopsy came back as + for atypical cells. He verbalized understanding. I explained that I will place an order referring the patient to see the oncologist who will go over treatment options for the patient.. The patient will need a PET scan, and an MRI brain.  He has not had PFTs. The patient and his wife are in [place] until at least next Monday.  7/25 Triage, please place order for ambulatory referral to oncology with Dr. Lorna Few.  Additionally please place order for PET scan and MRI brain. Please instruct schedulers to leave message on the patient's home phone where they will get the messages upon return home. If the patient calls with any further questions please direct those to either myself or Dr. Valeta Harms.  Thank you

## 2020-11-03 ENCOUNTER — Telehealth: Payer: Self-pay | Admitting: Acute Care

## 2020-11-03 NOTE — Addendum Note (Signed)
Addended by: Elby Beck R on: 11/03/2020 04:21 PM   Modules accepted: Orders

## 2020-11-03 NOTE — Telephone Encounter (Signed)
Called and spoke with patient and spouse. Patient spouse does not want anyone to call to house phone to schedule the oncologist visit. Patient and spouse are out of town at this time and would prefer the cell # called. I have changed the main number of contact to wife's cell. Orders have been placed for Referral to Oncology, PET scan and MRI of Brain. Nothing further needed at this time.

## 2020-11-10 ENCOUNTER — Ambulatory Visit (HOSPITAL_COMMUNITY): Payer: Medicare HMO

## 2020-11-11 ENCOUNTER — Inpatient Hospital Stay (HOSPITAL_COMMUNITY): Payer: Medicare HMO | Attending: Hematology and Oncology | Admitting: Hematology and Oncology

## 2020-11-11 ENCOUNTER — Encounter (HOSPITAL_COMMUNITY): Payer: Self-pay | Admitting: Hematology and Oncology

## 2020-11-11 ENCOUNTER — Other Ambulatory Visit: Payer: Self-pay

## 2020-11-11 VITALS — BP 133/66 | HR 102 | Temp 97.0°F | Resp 20 | Ht 69.0 in | Wt 279.6 lb

## 2020-11-11 DIAGNOSIS — E119 Type 2 diabetes mellitus without complications: Secondary | ICD-10-CM | POA: Insufficient documentation

## 2020-11-11 DIAGNOSIS — J449 Chronic obstructive pulmonary disease, unspecified: Secondary | ICD-10-CM | POA: Diagnosis not present

## 2020-11-11 DIAGNOSIS — I509 Heart failure, unspecified: Secondary | ICD-10-CM | POA: Diagnosis not present

## 2020-11-11 DIAGNOSIS — R5383 Other fatigue: Secondary | ICD-10-CM | POA: Insufficient documentation

## 2020-11-11 DIAGNOSIS — Z79899 Other long term (current) drug therapy: Secondary | ICD-10-CM | POA: Insufficient documentation

## 2020-11-11 DIAGNOSIS — G893 Neoplasm related pain (acute) (chronic): Secondary | ICD-10-CM | POA: Insufficient documentation

## 2020-11-11 DIAGNOSIS — M7918 Myalgia, other site: Secondary | ICD-10-CM | POA: Insufficient documentation

## 2020-11-11 DIAGNOSIS — G709 Myoneural disorder, unspecified: Secondary | ICD-10-CM | POA: Diagnosis not present

## 2020-11-11 DIAGNOSIS — F17210 Nicotine dependence, cigarettes, uncomplicated: Secondary | ICD-10-CM | POA: Diagnosis not present

## 2020-11-11 DIAGNOSIS — C3491 Malignant neoplasm of unspecified part of right bronchus or lung: Secondary | ICD-10-CM | POA: Diagnosis not present

## 2020-11-11 DIAGNOSIS — Z7982 Long term (current) use of aspirin: Secondary | ICD-10-CM | POA: Diagnosis not present

## 2020-11-11 DIAGNOSIS — K219 Gastro-esophageal reflux disease without esophagitis: Secondary | ICD-10-CM | POA: Diagnosis not present

## 2020-11-11 DIAGNOSIS — I2510 Atherosclerotic heart disease of native coronary artery without angina pectoris: Secondary | ICD-10-CM | POA: Insufficient documentation

## 2020-11-11 DIAGNOSIS — Z9981 Dependence on supplemental oxygen: Secondary | ICD-10-CM | POA: Insufficient documentation

## 2020-11-11 DIAGNOSIS — C3490 Malignant neoplasm of unspecified part of unspecified bronchus or lung: Secondary | ICD-10-CM | POA: Insufficient documentation

## 2020-11-11 DIAGNOSIS — I252 Old myocardial infarction: Secondary | ICD-10-CM | POA: Diagnosis not present

## 2020-11-11 DIAGNOSIS — J910 Malignant pleural effusion: Secondary | ICD-10-CM | POA: Insufficient documentation

## 2020-11-11 DIAGNOSIS — Z7902 Long term (current) use of antithrombotics/antiplatelets: Secondary | ICD-10-CM | POA: Insufficient documentation

## 2020-11-11 DIAGNOSIS — Z7984 Long term (current) use of oral hypoglycemic drugs: Secondary | ICD-10-CM | POA: Diagnosis not present

## 2020-11-11 NOTE — Assessment & Plan Note (Signed)
Unfortunately, he continues to smoke He stated that smoking is the only way that he could get his pain under control

## 2020-11-11 NOTE — Assessment & Plan Note (Signed)
He has chronic COPD and now with respiratory failure since his recent bronchoscopy and biopsy He will continue medical management and oxygen as directed by his pulmonologist

## 2020-11-11 NOTE — Assessment & Plan Note (Signed)
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 He is scheduled for MRI of the brain and PET CT scan We will get a definitive answer in terms of the stage of his disease I explained to the patient and his wife why he is not a surgical candidate I will refer him to see radiation oncologist to see if concurrent chemo radiation therapy or palliative radiation therapy can be offered The mainstay of treatment would be systemic treatment I will order order molecular studies with [facility] on his tissue biopsy The patient hates venous access We discussed the risk and benefits of port placement and he is in agreement but we will touch base with his cardiologist first to see if we can take him off Plavix in anticipation for port placement He will return as soon as PET CT scan result is available for further discussion about plan of care

## 2020-11-11 NOTE — Progress Notes (Signed)
[place] CONSULT NOTE  Patient Care Team: Leonie Douglas, MD as PCP - General (Family Medicine) Satira Sark, MD as PCP - Cardiology (Cardiology)  ASSESSMENT & PLAN:  Non-small cell lung cancer [facility]) 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 He is scheduled for MRI of the brain and PET CT scan We will get a definitive answer in terms of the stage of his disease I explained to the patient and his wife why he is not a surgical candidate I will refer him to see radiation oncologist to see if concurrent chemo radiation therapy or palliative radiation therapy can be offered The mainstay of treatment would be systemic treatment I will order order molecular studies with [facility] on his tissue biopsy The patient hates venous access We discussed the risk and benefits of port placement and he is in agreement but we will touch base with his cardiologist first to see if we can take him off Plavix in anticipation for port placement He will return as soon as PET CT scan result is available for further discussion about plan of care  Cigarette smoker Unfortunately, he continues to smoke He stated that smoking is the only way that he could get his pain under control  COPD GOLD ?  /  active smoker He has chronic COPD and now with respiratory failure since his recent bronchoscopy and biopsy He will continue medical management and oxygen as directed by his pulmonologist  Orders Placed This Encounter  Procedures   IR IMAGING GUIDED PORT INSERTION    Standing Status:   Future    Standing Expiration Date:   11/11/2021    Order Specific Question:   Reason for Exam (SYMPTOM  OR DIAGNOSIS REQUIRED)    Answer:   need port for chemo    Order Specific Question:   Preferred Imaging Location?    Answer:   External    The total time spent in the appointment was 60 minutes encounter with patients including review of chart and various  tests results, discussions about plan of care and coordination of care plan   All questions were answered. The patient knows to call the clinic with any problems, questions or concerns. No barriers to learning was detected.  Heath Lark, MD 7/27/20225:49 PM  CHIEF COMPLAINTS/PURPOSE OF CONSULTATION:  Non-small cell lung cancer, for further evaluation  HISTORY OF PRESENTING ILLNESS:  Thomas [name] 65 y.o. 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 His adopted son is young and lives at home with him  The patient is a lifelong smoker He has at least 50-pack-year history, started when he was 65 years old Since his recent biopsy, he has reduce his cigarette smoking down to about 6 cigarettes/day He has background history of COPD and uses oxygen therapy Since his recent bronchoscopy and biopsy, he became oxygen dependent  I have reviewed his records extensively In 2020, he was admitted at the end of August with acute exacerbation of congestive heart failure He had CT imaging performed at the time which show pleural effusion as well as abnormal appearance of his lung He had recurrent hospitalization for pneumonia Since early this year, he was discharged home on BiPAP but he returned the machine because he could not afford it He started using oxygen at nighttime but now becoming more oxygen dependent His oxygen saturation at home ranges between 81 to 90% He  has difficulties breathing in the morning due to sinus congestion After he clears his sinuses, he usually breathes better He has chronic excessive fatigue and shortness of breath on minimal exertion less than 1 block He denies cough No recent hemoptysis He uses nebulizer twice daily to break down his mucus He used to weigh over 300 pounds but have lost some weight recently He has chronic musculoskeletal pain throughout His appetite is preserved  I have reviewed his chart and  materials related to his cancer extensively and collaborated history with the patient. Summary of oncologic history is as follows: Oncology History  Non-small cell lung cancer ([place])  12/19/2018 Imaging   1. Small to moderate size right-sided pleural effusion that appears to be at least partially loculated. 2. Airspace consolidation involving the right middle lobe and right lower lobe concerning for multifocal pneumonia. Follow-up to resolution is recommended as an underlying mass cannot be excluded. 3. There are few scattered 4 mm pulmonary nodules as detailed above. No follow-up needed if patient is low-risk (and has no known or suspected primary neoplasm). Non-contrast chest CT can be considered in 12 months if patient is high-risk.  4. Right-sided thyroid nodule as detailed above. This can be further evaluated with ultrasound as clinically indicated. 5. Hepatic steatosis. 6. Advanced coronary artery calcifications are noted. 7. Dilated main pulmonary artery which can be seen in patients with elevated pulmonary artery pressures.   04/28/2020 Imaging   ECHO  1. Left ventricular ejection fraction, by estimation, is 55 to 60%. The left ventricle has normal function. The left ventricle has no regional wall motion abnormalities. Left ventricular diastolic parameters were normal.   2. Right ventricular systolic function is normal. The right ventricular size is normal.   3. The mitral valve is normal in structure. No evidence of mitral valve regurgitation. No evidence of mitral stenosis.   4. The aortic valve was not well visualized.   5. The inferior vena cava is normal in size with greater than 50% respiratory variability, suggesting right atrial pressure of 3 mmHg.    10/07/2020 Imaging   CT chest 1. Lung-RADS 4B, suspicious. Additional imaging evaluation or consultation with Pulmonology or Thoracic Surgery recommended. Right middle and lower lobe collapse associated with moderate right pleural  effusion. Occlusion of the bronchus intermedius evident. Central obstructing mass lesion a concern. CT chest with contrast recommended to further evaluate. These changes have been slowly progressive since 12/19/2018 and if neoplastic etiology has already been excluded, then given the degree of collapse/consolidation in the right lung, this patient is not a candidate for lung cancer screening. 2. Enlargement of the pulmonary outflow tract and main pulmonary arteries suggests pulmonary arterial hypertension. 3. Aortic Atherosclerosis (ICD10-I70.0).   10/30/2020 Pathology Results   Clinical History: Endobronchial lesion  Specimen Submitted:  B. LUNG, RLL SUPERIOR  , BRUSHING:    FINAL MICROSCOPIC DIAGNOSIS:  - Malignant cells consistent with squamous cell carcinoma    10/30/2020 Pathology Results   FINAL MICROSCOPIC DIAGNOSIS:  - Malignant cells consistent with squamous cell carcinoma    10/30/2020 Procedure   Pre-op Diagnosis: Collapsed RLL   Post-op Diagnosis: Endobronchial mass, RLL superior segment    Surgeon: Leory Plowman L Icard,DO  Operation: Flexible video fiberoptic bronchoscopy and biopsies. Indications and History: CLEMON DEVAUL is 65 y.o. with history of RLL collapse, and smoking.  Recommendation was to perform video fiberoptic bronchoscopy with biopsies. The risks, benefits, complications, treatment options and expected outcomes were discussed with the patient.  The possibilities of  pneumothorax, pneumonia, reaction to medication, pulmonary aspiration, perforation of a viscus, bleeding, failure to diagnose a condition and creating a complication requiring transfusion or operation were discussed with the patient who freely signed the consent.     Description of Procedure: The patient was seen in the Preoperative Area, was examined and was deemed appropriate to proceed.  The patient was taken to [facility] endoscopy room 2, identified as Thomas [name] and the procedure verified as Flexible Video  Fiberoptic Bronchoscopy.  A Time Out was held and the above information confirmed.   Therapeutic bronchoscope was inserted through the endotracheal tube and a general inspection was performed which showed normal cords, normal trachea, normal main carina. The R sided airways were inspected and showed normal RUL, BI, RML and the right lower lobe superior segment revealed a small endobronchial mass arising from the opening. The L side was then inspected. The LLL, Lingular and LUL airways were normal.    Using the standard therapeutic bronchoscope, and 2.8 mm [employer] forceps biopsies were taken from the right lower lobe superior segment opening and visible endobronchial tumor.  We also completed cytology brushings to the right lower lobe superior segment as well as BAL specimen to be sent for cytology to the right lower lobe.  Following biopsies we switched to the Olympus endobronchial ultrasound scope.  We were able to visualize the right lower lobe area of collapsed lung.  There was no clear identified mass under ultrasound.  I suspect that the lesion that we are seeing just arises from the opening of the right lower lobe superior segment.  Due to the area of atelectasis around the lesion was difficult to identify a discrete mass.  No samples were taken under ultrasound guidance.   Samples: 1. Endobronchial brushings from right lower lobe 2. Endobronchial forceps biopsies from right lower lobe 3. Bronchial washings from right lower lobe   11/11/2020 Initial Diagnosis   Non-small cell lung cancer ([place])   11/11/2020 Cancer Staging   Staging form: Lung, AJCC 8th Edition - Clinical: Stage IVA (cT4, cN0, cM1a) - Signed by Heath Lark, MD on 11/11/2020      MEDICAL HISTORY:  Past Medical History:  Diagnosis Date   Arthritis    Bone spur    cervical - numbness left arm and hand   CHF (congestive heart failure) (HCC)    COPD (chronic obstructive pulmonary disease) (HCC)    Coronary artery  disease    Dyspnea    Oxygen 2.5L via [place] qhs and occasionally during the day prn   Fatty liver    GERD (gastroesophageal reflux disease)    Morbid obesity ([place])    Neuromuscular disorder ([place])    NSTEMI (non-ST elevated myocardial infarction) ([place])    a. s/p cath in 03/2018 showing 90% OM2 stenosis and nonobstructive CAD along LAD and LCx with medical management recommended   Pneumonia    x2   Secondary cardiomyopathy ([place])    a. EF reported as 20-25% by echo in 03/2018 b. EF at 45% by repeat imaging in 10/2018   Snoring    Type 2 diabetes mellitus ([place])     SURGICAL HISTORY: Past Surgical History:  Procedure Laterality Date   BRONCHIAL BIOPSY  10/30/2020   Procedure: BRONCHIAL BIOPSIES;  Surgeon: Garner Nash, DO;  Location: [facility] ENDOSCOPY;  Service: Pulmonary;;   BRONCHIAL BRUSHINGS  10/30/2020   Procedure: BRONCHIAL BRUSHINGS;  Surgeon: Garner Nash, DO;  Location: [facility] ENDOSCOPY;  Service: Pulmonary;;   BRONCHIAL  WASHINGS  10/30/2020   Procedure: BRONCHIAL WASHINGS;  Surgeon: Garner Nash, DO;  Location: [facility] ENDOSCOPY;  Service: Pulmonary;;   CHOLECYSTECTOMY     COLONOSCOPY     ENDOBRONCHIAL ULTRASOUND  10/30/2020   Procedure: ENDOBRONCHIAL ULTRASOUND;  Surgeon: Garner Nash, DO;  Location: [facility] ENDOSCOPY;  Service: Pulmonary;;   LEFT HEART CATH AND CORONARY ANGIOGRAPHY N/A 03/30/2018   Procedure: LEFT HEART CATH AND CORONARY ANGIOGRAPHY;  Surgeon: Troy Sine, MD;  Location: [facility] CV LAB;  Service: Cardiovascular;  Laterality: N/A;   SHOULDER ARTHROSCOPY Bilateral    TONSILLECTOMY     UPPER GI ENDOSCOPY     VIDEO BRONCHOSCOPY Right 10/30/2020   Procedure: VIDEO BRONCHOSCOPY WITHOUT FLUORO;  Surgeon: Garner Nash, DO;  Location: [facility];  Service: Pulmonary;  Laterality: Right;  possible cryotherapy   WISDOM TOOTH EXTRACTION      SOCIAL HISTORY: Social History   Socioeconomic History   Marital status: Married    Spouse name: Not on file    Number of children: Not on file   Years of education: Not on file   Highest education level: Not on file  Occupational History   Not on file  Tobacco Use   Smoking status: Every Day    Packs/day: 0.50    Years: 55.00    Pack years: 27.50    Types: Cigarettes    Start date: 02/21/1965   Smokeless tobacco: Never   Tobacco comments:    smokes 10 cigarettes per day 10/28/20  Vaping Use   Vaping Use: Never used  Substance and Sexual Activity   Alcohol use: Not Currently   Drug use: Yes    Types: Marijuana    Comment: Last use 1970's   Sexual activity: Not on file  Other Topics Concern   Not on file  Social History Narrative   Not on file   Social Determinants of Health   Financial Resource Strain: Low Risk    Difficulty of Paying Living Expenses: Not hard at all  Food Insecurity: No Food Insecurity   Worried About Charity fundraiser in the Last Year: Never true   [place] in the Last Year: Never true  Transportation Needs: No Transportation Needs   Lack of Transportation (Medical): No   Lack of Transportation (Non-Medical): No  Physical Activity: Inactive   Days of Exercise per Week: 0 days   Minutes of Exercise per Session: 0 min  Stress: No Stress Concern Present   Feeling of Stress : Not at all  Social Connections: Moderately Isolated   Frequency of Communication with Friends and Family: More than three times a week   Frequency of Social Gatherings with Friends and Family: More than three times a week   Attends Religious Services: Never   Marine scientist or Organizations: No   Attends Music therapist: Never   Marital Status: Married  Human resources officer Violence: Not At Risk   Fear of Current or Ex-Partner: No   Emotionally Abused: No   Physically Abused: No   Sexually Abused: No    FAMILY HISTORY: Family History  Problem Relation Age of Onset   Emphysema Father    Heart failure Father     ALLERGIES:  is allergic to glipizide,  codeine, and doxycycline.  MEDICATIONS:  Current Outpatient Medications  Medication Sig Dispense Refill   albuterol (PROVENTIL) (2.5 MG/3ML) 0.083% nebulizer solution Take 3 mLs (2.5 mg total) by nebulization every 6 (six) hours as  needed for wheezing or shortness of breath. 120 mL 11   albuterol (VENTOLIN HFA) 108 (90 Base) MCG/ACT inhaler Inhale 2 puffs into the lungs every 6 (six) hours as needed for wheezing or shortness of breath.     aspirin EC 81 MG tablet Take 81 mg by mouth daily.     atorvastatin (LIPITOR) 80 MG tablet TAKE 1 TABLET (80 MG TOTAL) BY MOUTH DAILY AT 6 PM. 90 tablet 3   canagliflozin (INVOKANA) 300 MG TABS tablet Take 300 mg by mouth daily before breakfast.     clopidogrel (PLAVIX) 75 MG tablet TAKE ONE TABLET BY MOUTH DAILY WITH BREAKFAST 90 tablet 3   furosemide (LASIX) 20 MG tablet Take 1 tablet (20 mg total) by mouth 2 (two) times daily. 180 tablet 3   ibuprofen (ADVIL) 200 MG tablet Take 600 mg by mouth 2 (two) times daily.     isosorbide mononitrate (IMDUR) 60 MG 24 hr tablet TAKE ONE TABLET BY MOUTH EVERY DAY 90 tablet 3   metFORMIN (GLUCOPHAGE-XR) 500 MG 24 hr tablet Take 1,000 mg by mouth 2 (two) times daily.     nitroGLYCERIN (NITROSTAT) 0.4 MG SL tablet Place 1 tablet (0.4 mg total) under the tongue every 5 (five) minutes x 3 doses as needed for chest pain (if no relief after 2nd dose, proceed to the ED for an evaluation or call 911). 25 tablet 3   omeprazole (PRILOSEC) 40 MG capsule Take 40 mg by mouth daily.     potassium chloride (KLOR-CON) 10 MEQ tablet Take 1 tablet (10 mEq total) by mouth 2 (two) times daily. 90 tablet 1   sodium chloride (OCEAN) 0.65 % SOLN nasal spray Place 1 spray into both nostrils as needed for congestion.     carvedilol (COREG) 12.5 MG tablet TAKE 1 TABLET (12.5 MG TOTAL) BY MOUTH 2 (TWO) TIMES DAILY. 180 tablet 3   No current facility-administered medications for this visit.    REVIEW OF SYSTEMS:   Constitutional: Denies  fevers, chills or abnormal night sweats Eyes: Denies blurriness of vision, double vision or watery eyes Ears, nose, mouth, throat, and face: Denies mucositis or sore throat Gastrointestinal:  Denies nausea, heartburn or change in bowel habits Skin: Denies abnormal skin rashes Lymphatics: Denies new lymphadenopathy or easy bruising Neurological:Denies numbness, tingling or new weaknesses Behavioral/Psych: Mood is stable, no new changes  All other systems were reviewed with the patient and are negative.  PHYSICAL EXAMINATION: ECOG PERFORMANCE STATUS: 2 - Symptomatic, <50% confined to bed  Vitals:   11/11/20 1523  BP: 133/66  Pulse: (!) 102  Resp: 20  Temp: (!) 97 F (36.1 C)  SpO2: 91%   Filed Weights   11/11/20 1523  Weight: 279 lb 9.6 oz (126.8 kg)    GENERAL:alert, has oxygen delivered via nasal cannula.  Examination is limited due to central obesity SKIN: skin color, texture, turgor are normal, no rashes or significant lesions.  Noted skin bruising EYES: normal, conjunctiva are pink and non-injected, sclera clear OROPHARYNX:no exudate, no erythema and lips, buccal mucosa, and tongue normal  NECK: supple, thyroid normal size, non-tender, without nodularity LYMPH:  no palpable lymphadenopathy in the cervical, axillary or inguinal LUNGS: Reduced breath sounds throughout.  No breath sounds on the right middle and lower lung part of his lung.  Increased work of breathing is noted HEART: regular rate & rhythm and no murmurs with bilateral lower extremity edema ABDOMEN:abdomen soft, non-tender and normal bowel sounds Musculoskeletal:no cyanosis of digits and no clubbing  PSYCH: alert & oriented x 3 with fluent speech NEURO: no focal motor/sensory deficits  LABORATORY DATA:  I have reviewed the data as listed Lab Results  Component Value Date   WBC 6.9 10/30/2020   HGB 16.5 10/30/2020   HCT 55.0 (H) 10/30/2020   MCV 93.2 10/30/2020   PLT 224 10/30/2020   Recent Labs     04/27/20 1647 04/28/20 0325 05/04/20 0430 05/05/20 0358 10/30/20 0602  NA  --    < > 140 141 140  K  --    < > 3.7 3.6 4.1  CL  --    < > 87* 87* 100  CO2  --    < > 43* 40* 34*  GLUCOSE  --    < > 152* 133* 166*  BUN  --    < > 28* 29* 15  CREATININE  --    < > 0.67 0.82 0.55*  CALCIUM  --    < > 8.7* 9.1 9.0  GFRNONAA  --    < > >60 >60 >60  PROT 6.7  --   --   --   --   ALBUMIN 3.3*  --   --   --   --   AST 23  --   --   --   --   ALT 49*  --   --   --   --   ALKPHOS 141*  --   --   --   --   BILITOT 0.8  --   --   --   --   BILIDIR 0.2  --   --   --   --   IBILI 0.6  --   --   --   --    < > = values in this interval not displayed.    RADIOGRAPHIC STUDIES: I have reviewed multiple CT imaging with the patient and his wife I have personally reviewed the radiological images as listed and agreed with the findings in the report.

## 2020-11-12 ENCOUNTER — Ambulatory Visit: Payer: Medicare HMO | Admitting: Primary Care

## 2020-11-12 ENCOUNTER — Other Ambulatory Visit: Payer: Self-pay | Admitting: *Deleted

## 2020-11-12 ENCOUNTER — Encounter (HOSPITAL_COMMUNITY): Payer: Self-pay

## 2020-11-12 NOTE — Progress Notes (Signed)
[facility] testing requested per Dr.Gorsuch

## 2020-11-12 NOTE — Progress Notes (Signed)
The proposed treatment discussed in cancer conference is for discussion purpose only and is not a binding recommendation. The patient was not physically examined nor present for their treatment options. Therefore, final treatment plans cannot be decided.  ?

## 2020-11-14 ENCOUNTER — Ambulatory Visit
Admission: RE | Admit: 2020-11-14 | Discharge: 2020-11-14 | Disposition: A | Discharge status: Home / Self Care | Payer: Medicare HMO | Source: Ambulatory Visit | Attending: Acute Care | Admitting: Acute Care

## 2020-11-14 ENCOUNTER — Other Ambulatory Visit: Payer: Self-pay

## 2020-11-14 DIAGNOSIS — C3491 Malignant neoplasm of unspecified part of right bronchus or lung: Secondary | ICD-10-CM

## 2020-11-16 ENCOUNTER — Telehealth: Payer: Self-pay | Admitting: Acute Care

## 2020-11-16 ENCOUNTER — Other Ambulatory Visit: Payer: Self-pay | Admitting: *Deleted

## 2020-11-16 MED ORDER — CARVEDILOL 12.5 MG PO TABS: 12.5000 mg | ORAL_TABLET | Freq: Two times a day (BID) | ORAL | 3 refills | Status: DC

## 2020-11-16 NOTE — Telephone Encounter (Signed)
Magdalen Spatz, NP  You; [facility] Triage Pool Just now (2:06 PM)   Once MRI is scheduled, and only once scheduled and if we cannot get him into an open MRI send in xanax 0.25 mg x 1 dose.    Magdalen Spatz, NP  You; Ronalee Belts S 1 hour ago (12:13 PM)   Is it possible to get him scheduled at an open MRI?    Magdalen Spatz, NP  You 1 hour ago (12:12 PM)    I do not see that the MRI is even scheduled yet. Has it been scheduled?      Pt was scheduled to have the MRI on 11/14/20 at 4:20 but had to cancel it after seeing the type of the machine that the MRI was going to be performed in.

## 2020-11-16 NOTE — Telephone Encounter (Signed)
Sarah, please advise if you are okay prescribing something for pt to take to see if that would help calm him down to try to get the MRI done.

## 2020-11-16 NOTE — Telephone Encounter (Signed)
Magdalen Spatz, NP  You 16 minutes ago (2:36 PM)   I think the best option is an open MRI if possibel. Then he will not need the Xanax. Please let me know when the MRI is scheduled and if it is an open machine. Once all of that is determined, we can see if her needs me to send in the medication. Thanks    Routing to [facility] as we need to get pt rescheduled for the MRI. Also, need to know if the MRI can be an open MRI.

## 2020-11-18 ENCOUNTER — Telehealth: Payer: Self-pay | Admitting: Pulmonary Disease

## 2020-11-18 NOTE — Telephone Encounter (Signed)
I think we need to see if Thomas [name] will give her something to take before the [place]

## 2020-11-18 NOTE — Telephone Encounter (Signed)
Found this documentation from a call we received today.   "Hinton Dyer from [facility] calling because pt was having MRI- was in the larger OPEN MRI machine and still had a panic attack. Pt has been rescheduled for MRI 8/13, however pt will need something to relax him before going into machine. First MRI Was cancelled d/t panic attack. Questions- [phone] option 1, option 3"

## 2020-11-18 NOTE — Telephone Encounter (Signed)
Called GI and confirmed pt was checked in for MRI on 7/30 and refused due to being claustrophobic.   No documentation found that open MRI was offered but that is their protocol for pts w/ claustrophobia.   Advised that they would call pt to r/s and advise pt to call provider for meds to help him relax.  Please advise.

## 2020-11-18 NOTE — Telephone Encounter (Signed)
Sarah please advise, patient went for open MRI and still had a panic attack and could not do MRI. Imaging is requesting something be given to patient to help relax them. Thank you

## 2020-11-18 NOTE — Telephone Encounter (Signed)
Hinton Dyer from [facility] calling because pt was having MRI- was in the larger OPEN MRI machine and still had a panic attack. Pt has been rescheduled for MRI 8/13, however pt will need something to relax him before going into machine. First MRI Was cancelled d/t panic attack. Questions- [phone] option 1, option 3

## 2020-11-18 NOTE — Telephone Encounter (Signed)
Duplicate message. 

## 2020-11-19 ENCOUNTER — Telehealth: Payer: Self-pay | Admitting: Acute Care

## 2020-11-19 ENCOUNTER — Telehealth: Payer: Self-pay | Admitting: Primary Care

## 2020-11-19 ENCOUNTER — Other Ambulatory Visit: Payer: Self-pay | Admitting: Acute Care

## 2020-11-19 DIAGNOSIS — R911 Solitary pulmonary nodule: Secondary | ICD-10-CM

## 2020-11-19 MED ORDER — ALPRAZOLAM 0.25 MG PO TABS: 0.2500 mg | ORAL_TABLET | Freq: Once | ORAL | 0 refills | Status: AC

## 2020-11-19 NOTE — Telephone Encounter (Signed)
Error

## 2020-11-19 NOTE — Telephone Encounter (Signed)
Called and spoke with pt's spouse Tharon Aquas letting her know that SG called in a one time dose of xanax for pt to take 85min prior to the MRI and she verbalized understanding.  Pt already has had the scan rescheduled for 11/28/20. Nothing further needed.

## 2020-11-19 NOTE — Telephone Encounter (Signed)
I have called in a one-time dose of Xanax 0.25 mg for prior to his MRI brain. There are no refills. Please call the patient and let him know he is to take the Xanax 30 minutes prior to his MRI. He must have someone drive him to and from the imaging center. Please make sure he is aware he is not supposed to drive at all for 6 to 8 hours after he has taken the Xanax. Please make sure [facility] imaging has him scheduled for the open large MRI scanner. Thanks so much for your help triage.

## 2020-11-24 NOTE — Telephone Encounter (Signed)
See phone note from 8/4. Will close encounter.

## 2020-11-25 ENCOUNTER — Other Ambulatory Visit: Payer: Self-pay

## 2020-11-25 ENCOUNTER — Ambulatory Visit (HOSPITAL_COMMUNITY)
Admission: RE | Admit: 2020-11-25 | Discharge: 2020-11-25 | Disposition: A | Discharge status: Home / Self Care | Payer: Medicare HMO | Source: Ambulatory Visit | Attending: Acute Care | Admitting: Acute Care

## 2020-11-25 DIAGNOSIS — C3491 Malignant neoplasm of unspecified part of right bronchus or lung: Secondary | ICD-10-CM | POA: Diagnosis not present

## 2020-11-25 DIAGNOSIS — I700 Atherosclerosis of aorta: Secondary | ICD-10-CM | POA: Diagnosis not present

## 2020-11-25 DIAGNOSIS — E041 Nontoxic single thyroid nodule: Secondary | ICD-10-CM | POA: Diagnosis not present

## 2020-11-25 LAB — GLUCOSE, CAPILLARY: Glucose-Capillary: 181 mg/dL — ABNORMAL HIGH (ref 70–99)

## 2020-11-25 MED ORDER — FLUDEOXYGLUCOSE F - 18 (FDG) INJECTION
14.9000 | Freq: Once | INTRAVENOUS | Status: AC
  Administered 2020-11-25: 13.6 via INTRAVENOUS

## 2020-11-26 ENCOUNTER — Ambulatory Visit: Payer: Medicare HMO | Admitting: Primary Care

## 2020-11-26 ENCOUNTER — Encounter (HOSPITAL_COMMUNITY): Payer: Self-pay

## 2020-11-26 NOTE — Progress Notes (Signed)
Dr. Delton Coombes, here is the result of the PET scan for Mr. Thomas [name]. He has MRI brain on 8/13, and follow up with you 8/15. I wanted to make sure you had seen the results. I have not relayed these results to the patient. Thanks

## 2020-11-28 ENCOUNTER — Other Ambulatory Visit: Payer: Medicare HMO

## 2020-11-29 NOTE — Progress Notes (Signed)
[place], [place] [address]   CLINIC:  Medical Oncology/Hematology  PCP:  Leonie Douglas, MD [address] [place] [address] [phone]   REASON FOR VISIT:  Follow-up for non-small cell lung cancer  PRIOR THERAPY: none  NGS Results: not done  CURRENT THERAPY: under work-up  BRIEF ONCOLOGIC HISTORY:  Oncology History  Non-small cell lung cancer ([place])  12/19/2018 Imaging   1. Small to moderate size right-sided pleural effusion that appears to be at least partially loculated. 2. Airspace consolidation involving the right middle lobe and right lower lobe concerning for multifocal pneumonia. Follow-up to resolution is recommended as an underlying mass cannot be excluded. 3. There are few scattered 4 mm pulmonary nodules as detailed above. No follow-up needed if patient is low-risk (and has no known or suspected primary neoplasm). Non-contrast chest CT can be considered in 12 months if patient is high-risk.  4. Right-sided thyroid nodule as detailed above. This can be further evaluated with ultrasound as clinically indicated. 5. Hepatic steatosis. 6. Advanced coronary artery calcifications are noted. 7. Dilated main pulmonary artery which can be seen in patients with elevated pulmonary artery pressures.   04/28/2020 Imaging   ECHO  1. Left ventricular ejection fraction, by estimation, is 55 to 60%. The left ventricle has normal function. The left ventricle has no regional wall motion abnormalities. Left ventricular diastolic parameters were normal.   2. Right ventricular systolic function is normal. The right ventricular size is normal.   3. The mitral valve is normal in structure. No evidence of mitral valve regurgitation. No evidence of mitral stenosis.   4. The aortic valve was not well visualized.   5. The inferior vena cava is normal in size with greater than 50% respiratory variability, suggesting right atrial pressure of 3  mmHg.    10/07/2020 Imaging   CT chest 1. Lung-RADS 4B, suspicious. Additional imaging evaluation or consultation with Pulmonology or Thoracic Surgery recommended. Right middle and lower lobe collapse associated with moderate right pleural effusion. Occlusion of the bronchus intermedius evident. Central obstructing mass lesion a concern. CT chest with contrast recommended to further evaluate. These changes have been slowly progressive since 12/19/2018 and if neoplastic etiology has already been excluded, then given the degree of collapse/consolidation in the right lung, this patient is not a candidate for lung cancer screening. 2. Enlargement of the pulmonary outflow tract and main pulmonary arteries suggests pulmonary arterial hypertension. 3. Aortic Atherosclerosis (ICD10-I70.0).   10/30/2020 Pathology Results   Clinical History: Endobronchial lesion  Specimen Submitted:  B. LUNG, RLL SUPERIOR  , BRUSHING:    FINAL MICROSCOPIC DIAGNOSIS:  - Malignant cells consistent with squamous cell carcinoma    10/30/2020 Pathology Results   FINAL MICROSCOPIC DIAGNOSIS:  - Malignant cells consistent with squamous cell carcinoma    10/30/2020 Procedure   Pre-op Diagnosis: Collapsed RLL   Post-op Diagnosis: Endobronchial mass, RLL superior segment    Surgeon: Leory Plowman L Icard,DO  Operation: Flexible video fiberoptic bronchoscopy and biopsies. Indications and History: LEONID MANUS is 65 y.o. with history of RLL collapse, and smoking.  Recommendation was to perform video fiberoptic bronchoscopy with biopsies. The risks, benefits, complications, treatment options and expected outcomes were discussed with the patient.  The possibilities of pneumothorax, pneumonia, reaction to medication, pulmonary aspiration, perforation of a viscus, bleeding, failure to diagnose a condition and creating a complication requiring transfusion or operation were discussed with the patient who freely signed the consent.  Description of Procedure: The patient was seen in the Preoperative Area, was examined and was deemed appropriate to proceed.  The patient was taken to [facility] endoscopy room 2, identified as Tenna Delaine and the procedure verified as Flexible Video Fiberoptic Bronchoscopy.  A Time Out was held and the above information confirmed.   Therapeutic bronchoscope was inserted through the endotracheal tube and a general inspection was performed which showed normal cords, normal trachea, normal main carina. The R sided airways were inspected and showed normal RUL, BI, RML and the right lower lobe superior segment revealed a small endobronchial mass arising from the opening. The L side was then inspected. The LLL, Lingular and LUL airways were normal.    Using the standard therapeutic bronchoscope, and 2.8 mm [employer] forceps biopsies were taken from the right lower lobe superior segment opening and visible endobronchial tumor.  We also completed cytology brushings to the right lower lobe superior segment as well as BAL specimen to be sent for cytology to the right lower lobe.  Following biopsies we switched to the Olympus endobronchial ultrasound scope.  We were able to visualize the right lower lobe area of collapsed lung.  There was no clear identified mass under ultrasound.  I suspect that the lesion that we are seeing just arises from the opening of the right lower lobe superior segment.  Due to the area of atelectasis around the lesion was difficult to identify a discrete mass.  No samples were taken under ultrasound guidance.   Samples: 1. Endobronchial brushings from right lower lobe 2. Endobronchial forceps biopsies from right lower lobe 3. Bronchial washings from right lower lobe   11/11/2020 Initial Diagnosis   Non-small cell lung cancer ([place])   11/11/2020 Cancer Staging   Staging form: Lung, AJCC 8th Edition - Clinical: Stage IVA (cT4, cN0, cM1a) - Signed by Heath Lark, MD on 11/11/2020    11/25/2020 Genetic Testing   PD-L1 Results:       CANCER STAGING: Cancer Staging Non-small cell lung cancer ([place]) Staging form: Lung, AJCC 8th Edition - Clinical: Stage IVA (cT4, cN0, cM1a) - Signed by Heath Lark, MD on 11/11/2020   INTERVAL HISTORY:  Mr. SOPHEAP BOEHLE, a 65 y.o. male, returns for routine follow-up of his non-small cell lung cancer. Vint was last seen on 11/11/20 by Dr. Heath Lark.   Today he reports feeling well, and he is accompanied by his wife. He expresses that he does not want to have an MRI or any chemotherapy. He reports he had pneumonia since January, and he was put on O2 via nasal cannula at that time. He denies SOB, weight loss, blood while coughing, new pains. He reports that he spends most of his day sitting down. He reports a history of arthritis, and denies any history of autoimmune disorders. He is currently taking Lasix. He had a MI in December 2019, and he is now taking Plavix. He will be out of state in [place] until 08/22.  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 He is a smoker and has been smoking less than 1 ppd intermittently since he was 59 years old. His father had mesothelioma, his paternal grandfather had throat cancer, his brother had small cell cancer, 3 paternal uncles with unspecified cancers, and his maternal grandmother had colon cancer.  REVIEW OF SYSTEMS:  Review of Systems  Constitutional:  Positive for fatigue (25%). Negative for appetite change and unexpected weight  change.  Respiratory:  Positive for cough and shortness of breath (w/ exertion).   Gastrointestinal:  Positive for constipation.  Musculoskeletal:  Positive for back pain (4/10 low) and myalgias (4/10 legs).  All other systems reviewed and are negative.  PAST MEDICAL/SURGICAL HISTORY:  Past Medical History:  Diagnosis Date   Arthritis    Bone spur    cervical - numbness left arm and hand    CHF (congestive heart failure) (HCC)    COPD (chronic obstructive pulmonary disease) (HCC)    Coronary artery disease    Dyspnea    Oxygen 2.5L via [place] qhs and occasionally during the day prn   Fatty liver    GERD (gastroesophageal reflux disease)    Morbid obesity ([place])    Neuromuscular disorder ([place])    NSTEMI (non-ST elevated myocardial infarction) ([place])    a. s/p cath in 03/2018 showing 90% OM2 stenosis and nonobstructive CAD along LAD and LCx with medical management recommended   Pneumonia    x2   Secondary cardiomyopathy ([place])    a. EF reported as 20-25% by echo in 03/2018 b. EF at 45% by repeat imaging in 10/2018   Snoring    Type 2 diabetes mellitus ([place])    Past Surgical History:  Procedure Laterality Date   BRONCHIAL BIOPSY  10/30/2020   Procedure: BRONCHIAL BIOPSIES;  Surgeon: Garner Nash, DO;  Location: [facility];  Service: Pulmonary;;   BRONCHIAL BRUSHINGS  10/30/2020   Procedure: BRONCHIAL BRUSHINGS;  Surgeon: Garner Nash, DO;  Location: [facility];  Service: Pulmonary;;   BRONCHIAL WASHINGS  10/30/2020   Procedure: BRONCHIAL WASHINGS;  Surgeon: Garner Nash, DO;  Location: [facility];  Service: Pulmonary;;   CHOLECYSTECTOMY     COLONOSCOPY     ENDOBRONCHIAL ULTRASOUND  10/30/2020   Procedure: ENDOBRONCHIAL ULTRASOUND;  Surgeon: Garner Nash, DO;  Location: [facility];  Service: Pulmonary;;   LEFT HEART CATH AND CORONARY ANGIOGRAPHY N/A 03/30/2018   Procedure: LEFT HEART CATH AND CORONARY ANGIOGRAPHY;  Surgeon: Troy Sine, MD;  Location: [facility];  Service: Cardiovascular;  Laterality: N/A;   SHOULDER ARTHROSCOPY Bilateral    TONSILLECTOMY     UPPER GI ENDOSCOPY     VIDEO BRONCHOSCOPY Right 10/30/2020   Procedure: VIDEO BRONCHOSCOPY WITHOUT FLUORO;  Surgeon: Garner Nash, DO;  Location: [facility];  Service: Pulmonary;  Laterality: Right;  possible cryotherapy   WISDOM TOOTH EXTRACTION      SOCIAL HISTORY:  Social  History   Socioeconomic History   Marital status: Married    Spouse name: Not on file   Number of children: Not on file   Years of education: Not on file   Highest education level: Not on file  Occupational History   Not on file  Tobacco Use   Smoking status: Every Day    Packs/day: 0.50    Years: 55.00    Pack years: 27.50    Types: Cigarettes    Start date: 02/21/1965   Smokeless tobacco: Never   Tobacco comments:    smokes 10 cigarettes per day 10/28/20  Vaping Use   Vaping Use: Never used  Substance and Sexual Activity   Alcohol use: Not Currently   Drug use: Yes    Types: Marijuana    Comment: Last use 1970's   Sexual activity: Not on file  Other Topics Concern   Not on file  Social History Narrative   Not on file   Social Determinants of Health  Financial Resource Strain: Low Risk    Difficulty of Paying Living Expenses: Not hard at all  Food Insecurity: No Food Insecurity   Worried About Charity fundraiser in the Last Year: Never true   Ran Out of Food in the Last Year: Never true  Transportation Needs: No Transportation Needs   Lack of Transportation (Medical): No   Lack of Transportation (Non-Medical): No  Physical Activity: Inactive   Days of Exercise per Week: 0 days   Minutes of Exercise per Session: 0 min  Stress: No Stress Concern Present   Feeling of Stress : Not at all  Social Connections: Moderately Isolated   Frequency of Communication with Friends and Family: More than three times a week   Frequency of Social Gatherings with Friends and Family: More than three times a week   Attends Religious Services: Never   Marine scientist or Organizations: No   Attends Music therapist: Never   Marital Status: Married  Human resources officer Violence: Not At Risk   Fear of Current or Ex-Partner: No   Emotionally Abused: No   Physically Abused: No   Sexually Abused: No    FAMILY HISTORY:  Family History  Problem Relation Age of Onset    Emphysema Father    Heart failure Father     CURRENT MEDICATIONS:  Current Outpatient Medications  Medication Sig Dispense Refill   albuterol (PROVENTIL) (2.5 MG/3ML) 0.083% nebulizer solution Take 3 mLs (2.5 mg total) by nebulization every 6 (six) hours as needed for wheezing or shortness of breath. 120 mL 11   albuterol (VENTOLIN HFA) 108 (90 Base) MCG/ACT inhaler Inhale 2 puffs into the lungs every 6 (six) hours as needed for wheezing or shortness of breath.     aspirin EC 81 MG tablet Take 81 mg by mouth daily.     atorvastatin (LIPITOR) 80 MG tablet TAKE 1 TABLET (80 MG TOTAL) BY MOUTH DAILY AT 6 PM. 90 tablet 3   canagliflozin (INVOKANA) 300 MG TABS tablet Take 300 mg by mouth daily before breakfast.     carvedilol (COREG) 12.5 MG tablet Take 1 tablet (12.5 mg total) by mouth 2 (two) times daily. 180 tablet 3   clopidogrel (PLAVIX) 75 MG tablet TAKE ONE TABLET BY MOUTH DAILY WITH BREAKFAST 90 tablet 3   furosemide (LASIX) 20 MG tablet Take 1 tablet (20 mg total) by mouth 2 (two) times daily. 180 tablet 3   ibuprofen (ADVIL) 200 MG tablet Take 600 mg by mouth 2 (two) times daily.     isosorbide mononitrate (IMDUR) 60 MG 24 hr tablet TAKE ONE TABLET BY MOUTH EVERY DAY 90 tablet 3   metFORMIN (GLUCOPHAGE-XR) 500 MG 24 hr tablet Take 1,000 mg by mouth 2 (two) times daily.     nitroGLYCERIN (NITROSTAT) 0.4 MG SL tablet Place 1 tablet (0.4 mg total) under the tongue every 5 (five) minutes x 3 doses as needed for chest pain (if no relief after 2nd dose, proceed to the ED for an evaluation or call 911). 25 tablet 3   omeprazole (PRILOSEC) 40 MG capsule Take 40 mg by mouth daily.     potassium chloride (KLOR-CON) 10 MEQ tablet Take 1 tablet (10 mEq total) by mouth 2 (two) times daily. 90 tablet 1   sodium chloride (OCEAN) 0.65 % SOLN nasal spray Place 1 spray into both nostrils as needed for congestion.     No current facility-administered medications for this visit.    ALLERGIES:  Allergies  Allergen Reactions   Glipizide Palpitations   Codeine Nausea And Vomiting   Doxycycline Other (See Comments)    GI-Upset    PHYSICAL EXAM:  Performance status (ECOG): 2 - Symptomatic, <50% confined to bed  There were no vitals filed for this visit. Wt Readings from Last 3 Encounters:  11/11/20 279 lb 9.6 oz (126.8 kg)  10/28/20 278 lb (126.1 kg)  10/15/20 283 lb 9.6 oz (128.6 kg)   Physical Exam Vitals reviewed.  Constitutional:      Appearance: Normal appearance.     Interventions: Nasal cannula in place.  Cardiovascular:     Rate and Rhythm: Normal rate and regular rhythm.     Pulses: Normal pulses.     Heart sounds: Normal heart sounds.  Pulmonary:     Effort: Pulmonary effort is normal.     Breath sounds: Normal breath sounds.  Neurological:     General: No focal deficit present.     Mental Status: He is alert and oriented to person, place, and time.  Psychiatric:        Mood and Affect: Mood normal.        Behavior: Behavior normal.     LABORATORY DATA:  I have reviewed the labs as listed.  CBC Latest Ref Rng & Units 10/30/2020 05/03/2020 05/01/2020  WBC 4.0 - 10.5 K/uL 6.9 5.8 7.7  Hemoglobin 13.0 - 17.0 g/dL 16.5 17.0 16.1  Hematocrit 39.0 - 52.0 % 55.0(H) 58.9(H) 56.7(H)  Platelets 150 - 400 K/uL 224 188 193   CMP Latest Ref Rng & Units 10/30/2020 05/05/2020 05/04/2020  Glucose 70 - 99 mg/dL 166(H) 133(H) 152(H)  BUN 8 - 23 mg/dL 15 29(H) 28(H)  Creatinine 0.61 - 1.24 mg/dL 0.55(L) 0.82 0.67  Sodium 135 - 145 mmol/L 140 141 140  Potassium 3.5 - 5.1 mmol/L 4.1 3.6 3.7  Chloride 98 - 111 mmol/L 100 87(L) 87(L)  CO2 22 - 32 mmol/L 34(H) 40(H) 43(H)  Calcium 8.9 - 10.3 mg/dL 9.0 9.1 8.7(L)  Total Protein 6.5 - 8.1 g/dL - - -  Total Bilirubin 0.3 - 1.2 mg/dL - - -  Alkaline Phos 38 - 126 U/L - - -  AST 15 - 41 U/L - - -  ALT 0 - 44 U/L - - -    DIAGNOSTIC IMAGING:  I have independently reviewed the scans and discussed with the patient. NM PET  Image Initial (PI) Skull Base To Thigh  Result Date: 11/25/2020 CLINICAL DATA:  Initial treatment strategy for lung cancer. Endobronchial mass on bronchoscopy. EXAM: NUCLEAR MEDICINE PET SKULL BASE TO THIGH TECHNIQUE: 13.6 mCi F-18 FDG was injected intravenously. Full-ring PET imaging was performed from the skull base to thigh after the radiotracer. CT data was obtained and used for attenuation correction and anatomic localization. Fasting blood glucose: 181 mg/dl COMPARISON:  Chest CT 10/07/2020 FINDINGS: Mediastinal blood pool activity: SUV max 2.7 Liver activity: SUV max NA NECK: No hypermetabolic lymph nodes in the neck. Incidental CT findings: 1.6 cm right thyroid nodule. CHEST: Small hypermetabolic focus identified in the posterior aspect of the inferior right hilum, near the region of airway occlusion with SUV max = 7.9. No hypermetabolic mediastinal or left hilar disease. No hypermetabolic axillary lymphadenopathy. Incidental CT findings: Similar appearance right lower lobe and right middle lobe collapse. Stable right pleural effusion. ABDOMEN/PELVIS: No abnormal hypermetabolic activity within the liver, pancreas, adrenal glands, or spleen. No hypermetabolic lymph nodes in the abdomen or pelvis. Incidental CT findings: Gallbladder surgically absent.  Abdominal aortic atherosclerosis. Left colonic diverticulosis without diverticulitis. SKELETON: Small focus hypermetabolism identified posterior left sacrum, indeterminate as no underlying abnormality evident on CT imaging. Incidental CT findings: none IMPRESSION: 1. Small hypermetabolic focus in the inferior right hilum, in the region of the airway occlusion, consistent with the patient's known endobronchial neoplasm. No evidence for hypermetabolic soft tissue metastatic disease in the chest, abdomen, or pelvis. 2. Tiny focus of low level hypermetabolism the posterior left sacrum, indeterminate. While metastatic disease is considered unlikely, close attention  on follow-up is recommended. 3. 1.6 cm right thyroid nodule without hypermetabolism. Recommend thyroid US (ref: J Am Coll Radiol. 2015 Feb;12(2): 143-50). 4.  Aortic Atherosclerois (ICD10-170.0) Electronically Signed   By: Misty Stanley M.D.   On: 11/25/2020 16:51     ASSESSMENT:  1.  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  Rest of the bronchoscopy was normal. - Right lower lobe brushing consistent with malignant cells, squamous cell carcinoma. - PET scan on 11/25/2020 with small hypermetabolic focus in the posterior aspect of the inferior right hilum, near the region of the airway occlusion SUV max 7.9.  No hypermetabolic mediastinal or left hilar disease.  No hypermetabolic metastatic disease.  Similar appearance of right lower lobe and right middle lobe collapse.  Stable right pleural effusion. - Continuous oxygen requirement since her pneumonia in January 2022.  Denies any hemoptysis.  Denies weight loss.  Neck - PD-L1 TPS (22 C3)-70% - Foundation 1 with PIK3CA amplification, FGF 3/FGF 4 amplification, no EGFR/ALK/RET/BRAF mutations.  2.  Social/family history: - He is a retired Cabin crew.  Exposure to solvents and asbestos present. - Smoked less than 1 pack/day for 50 years.  Current active smoker. - Father died of mesothelioma.  Paternal uncle had throat cancer.  3 other paternal uncles had cancers.  Brother had small cell lung cancer.  Maternal grandmother had colon cancer.   PLAN:  1.  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 - We also reviewed PET scan images from 11/25/2020 which showed small hypermetabolic focus in the right inferior hilum. - Patient is agreeable to radiation therapy but not to chemotherapy. - He is also agreeable to immunotherapy. -  Recommend right thoracentesis and cytology examination. - If the cytology is positive, we will proceed with pembrolizumab given PD-L1 TPS 70%. - Would also recommend port placement. - We will order CT head with and without contrast as he cannot tolerate MRI. - Would recommend follow-up after right thoracentesis.  2.  Congestive heart failure: - Last echocardiogram on 04/28/2020 with EF 55-60%.  LV has no regional wall motion abnormalities. - Continue Coreg 12.5 mg twice daily.  Continue Lasix 20 mg twice daily. - He is also on Plavix 75 mg and aspirin 81 mg daily for medical management of CAD.   Orders placed this encounter:  No orders of the defined types were placed in this encounter.  Total time spent is 40 minutes with more than 70% of the time spent face-to-face discussing diagnosis, further work-up, treatment plan, counseling and coordination of care.  Derek Jack, MD [place] [phone]   I, Thana Ates, am acting as a scribe for Dr. Derek Jack.  I, Derek Jack MD, have reviewed the above documentation for accuracy and completeness, and I agree with the above.

## 2020-11-30 ENCOUNTER — Other Ambulatory Visit: Payer: Self-pay

## 2020-11-30 ENCOUNTER — Inpatient Hospital Stay (HOSPITAL_COMMUNITY): Payer: Medicare HMO | Attending: Hematology and Oncology | Admitting: Hematology

## 2020-11-30 VITALS — BP 127/70 | HR 88 | Temp 98.1°F | Resp 19 | Wt 281.5 lb

## 2020-11-30 DIAGNOSIS — R5383 Other fatigue: Secondary | ICD-10-CM | POA: Diagnosis not present

## 2020-11-30 DIAGNOSIS — R0602 Shortness of breath: Secondary | ICD-10-CM | POA: Diagnosis not present

## 2020-11-30 DIAGNOSIS — I509 Heart failure, unspecified: Secondary | ICD-10-CM | POA: Insufficient documentation

## 2020-11-30 DIAGNOSIS — R059 Cough, unspecified: Secondary | ICD-10-CM | POA: Insufficient documentation

## 2020-11-30 DIAGNOSIS — Z79899 Other long term (current) drug therapy: Secondary | ICD-10-CM | POA: Diagnosis not present

## 2020-11-30 DIAGNOSIS — Z7984 Long term (current) use of oral hypoglycemic drugs: Secondary | ICD-10-CM | POA: Diagnosis not present

## 2020-11-30 DIAGNOSIS — E669 Obesity, unspecified: Secondary | ICD-10-CM | POA: Diagnosis not present

## 2020-11-30 DIAGNOSIS — G709 Myoneural disorder, unspecified: Secondary | ICD-10-CM | POA: Insufficient documentation

## 2020-11-30 DIAGNOSIS — J449 Chronic obstructive pulmonary disease, unspecified: Secondary | ICD-10-CM | POA: Diagnosis not present

## 2020-11-30 DIAGNOSIS — C3490 Malignant neoplasm of unspecified part of unspecified bronchus or lung: Secondary | ICD-10-CM | POA: Diagnosis not present

## 2020-11-30 DIAGNOSIS — M549 Dorsalgia, unspecified: Secondary | ICD-10-CM | POA: Insufficient documentation

## 2020-11-30 DIAGNOSIS — F17210 Nicotine dependence, cigarettes, uncomplicated: Secondary | ICD-10-CM | POA: Insufficient documentation

## 2020-11-30 DIAGNOSIS — M1990 Unspecified osteoarthritis, unspecified site: Secondary | ICD-10-CM | POA: Insufficient documentation

## 2020-11-30 DIAGNOSIS — M791 Myalgia, unspecified site: Secondary | ICD-10-CM | POA: Diagnosis not present

## 2020-11-30 DIAGNOSIS — K219 Gastro-esophageal reflux disease without esophagitis: Secondary | ICD-10-CM | POA: Insufficient documentation

## 2020-11-30 DIAGNOSIS — I700 Atherosclerosis of aorta: Secondary | ICD-10-CM | POA: Insufficient documentation

## 2020-11-30 DIAGNOSIS — K5900 Constipation, unspecified: Secondary | ICD-10-CM | POA: Diagnosis not present

## 2020-11-30 DIAGNOSIS — I429 Cardiomyopathy, unspecified: Secondary | ICD-10-CM | POA: Insufficient documentation

## 2020-11-30 DIAGNOSIS — I252 Old myocardial infarction: Secondary | ICD-10-CM | POA: Diagnosis not present

## 2020-11-30 DIAGNOSIS — C3431 Malignant neoplasm of lower lobe, right bronchus or lung: Secondary | ICD-10-CM | POA: Insufficient documentation

## 2020-11-30 DIAGNOSIS — I2510 Atherosclerotic heart disease of native coronary artery without angina pectoris: Secondary | ICD-10-CM | POA: Diagnosis not present

## 2020-11-30 DIAGNOSIS — Z7901 Long term (current) use of anticoagulants: Secondary | ICD-10-CM | POA: Insufficient documentation

## 2020-11-30 NOTE — Patient Instructions (Addendum)
[place] at [facility] Discharge Instructions  You were seen today by Dr. Delton Coombes. He went over your recent results. You will be scheduled for a CT scan of your head prior to your next visit. Dr. Delton Coombes will see you back for labs and follow up.   Thank you for choosing [place] at [facility] to provide your oncology and hematology care.  To afford each patient quality time with our provider, please arrive at least 15 minutes before your scheduled appointment time.   If you have a lab appointment with the [facility] please come in thru the Main Entrance and check in at the main information desk  You need to re-schedule your appointment should you arrive 10 or more minutes late.  We strive to give you quality time with our providers, and arriving late affects you and other patients whose appointments are after yours.  Also, if you no show three or more times for appointments you may be dismissed from the clinic at the providers discretion.     Again, thank you for choosing [facility].  Our hope is that these requests will decrease the amount of time that you wait before being seen by our physicians.       _____________________________________________________________  Should you have questions after your visit to [facility], please contact our office at [phone] between the hours of 8:00 a.m. and 4:30 p.m.  Voicemails left after 4:00 p.m. will not be returned until the following business day.  For prescription refill requests, have your pharmacy contact our office and allow 72 hours.    Cancer Center Support Programs:   > Cancer Support Group  2nd Tuesday of the month 1pm-2pm, Journey Room

## 2020-12-01 ENCOUNTER — Encounter (HOSPITAL_COMMUNITY): Payer: Self-pay

## 2020-12-01 ENCOUNTER — Other Ambulatory Visit (HOSPITAL_COMMUNITY): Payer: Self-pay | Admitting: Hematology

## 2020-12-01 DIAGNOSIS — C3490 Malignant neoplasm of unspecified part of unspecified bronchus or lung: Secondary | ICD-10-CM

## 2020-12-04 ENCOUNTER — Other Ambulatory Visit: Payer: Self-pay | Admitting: Student

## 2020-12-04 ENCOUNTER — Other Ambulatory Visit (HOSPITAL_COMMUNITY): Payer: Self-pay

## 2020-12-04 ENCOUNTER — Other Ambulatory Visit: Payer: Self-pay | Admitting: Radiology

## 2020-12-04 DIAGNOSIS — C3490 Malignant neoplasm of unspecified part of unspecified bronchus or lung: Secondary | ICD-10-CM

## 2020-12-04 DIAGNOSIS — C3491 Malignant neoplasm of unspecified part of right bronchus or lung: Secondary | ICD-10-CM

## 2020-12-07 ENCOUNTER — Ambulatory Visit (HOSPITAL_COMMUNITY): Payer: Medicare HMO

## 2020-12-07 ENCOUNTER — Inpatient Hospital Stay (HOSPITAL_COMMUNITY): Admission: RE | Admit: 2020-12-07 | Payer: Medicare HMO | Source: Ambulatory Visit

## 2020-12-07 ENCOUNTER — Telehealth (HOSPITAL_COMMUNITY): Payer: Self-pay | Admitting: Hematology

## 2020-12-08 ENCOUNTER — Ambulatory Visit (HOSPITAL_COMMUNITY)
Admission: RE | Admit: 2020-12-08 | Discharge: 2020-12-08 | Disposition: A | Discharge status: Home / Self Care | Payer: Medicare HMO | Source: Ambulatory Visit | Attending: Internal Medicine | Admitting: Internal Medicine

## 2020-12-08 ENCOUNTER — Other Ambulatory Visit (HOSPITAL_COMMUNITY): Payer: Medicare HMO

## 2020-12-08 ENCOUNTER — Other Ambulatory Visit: Payer: Self-pay

## 2020-12-08 ENCOUNTER — Ambulatory Visit (HOSPITAL_COMMUNITY)
Admission: RE | Admit: 2020-12-08 | Discharge: 2020-12-08 | Disposition: A | Discharge status: Home / Self Care | Payer: Medicare HMO | Source: Ambulatory Visit | Attending: Hematology | Admitting: Hematology

## 2020-12-08 DIAGNOSIS — C3490 Malignant neoplasm of unspecified part of unspecified bronchus or lung: Secondary | ICD-10-CM | POA: Diagnosis not present

## 2020-12-08 DIAGNOSIS — Z9889 Other specified postprocedural states: Secondary | ICD-10-CM

## 2020-12-08 DIAGNOSIS — J90 Pleural effusion, not elsewhere classified: Secondary | ICD-10-CM | POA: Diagnosis not present

## 2020-12-08 LAB — POCT I-STAT CREATININE: Creatinine, Ser: 1.1 mg/dL (ref 0.61–1.24)

## 2020-12-08 MED ORDER — IOHEXOL 350 MG/ML SOLN
75.0000 mL | Freq: Once | INTRAVENOUS | Status: AC | PRN
  Administered 2020-12-08: 75 mL via INTRAVENOUS

## 2020-12-08 MED ORDER — LIDOCAINE HCL 1 % IJ SOLN
INTRAMUSCULAR | Status: AC
  Filled 2020-12-08: qty 20

## 2020-12-08 NOTE — Procedures (Signed)
  Procedure: US thoracentesis R 1.2L, for cyto EBL:   minimal Complications:  none immediate  See full dictation in [facility].  Dillard Cannon MD Main # [phone] Pager  [phone]

## 2020-12-09 LAB — CYTOLOGY - NON PAP

## 2020-12-15 ENCOUNTER — Ambulatory Visit (HOSPITAL_COMMUNITY): Payer: Medicare HMO | Admitting: Hematology

## 2020-12-16 NOTE — Progress Notes (Signed)
Thomas [name], [place] [id number]   CLINIC:  Medical Oncology/Hematology  PCP:  Leonie Douglas, MD [address] [place] [address] [phone]   REASON FOR VISIT:  Follow-up for non-small cell lung cancer  PRIOR THERAPY: none  NGS Results: not done  CURRENT THERAPY: under work-up  BRIEF ONCOLOGIC HISTORY:  Oncology History  Non-small cell lung cancer ([facility])  12/19/2018 Imaging   1. Small to moderate size right-sided pleural effusion that appears to be at least partially loculated. 2. Airspace consolidation involving the right middle lobe and right lower lobe concerning for multifocal pneumonia. Follow-up to resolution is recommended as an underlying mass cannot be excluded. 3. There are few scattered 4 mm pulmonary nodules as detailed above. No follow-up needed if patient is low-risk (and has no known or suspected primary neoplasm). Non-contrast chest CT can be considered in 12 months if patient is high-risk.  4. Right-sided thyroid nodule as detailed above. This can be further evaluated with ultrasound as clinically indicated. 5. Hepatic steatosis. 6. Advanced coronary artery calcifications are noted. 7. Dilated main pulmonary artery which can be seen in patients with elevated pulmonary artery pressures.   04/28/2020 Imaging   ECHO  1. Left ventricular ejection fraction, by estimation, is 55 to 60%. The left ventricle has normal function. The left ventricle has no regional wall motion abnormalities. Left ventricular diastolic parameters were normal.   2. Right ventricular systolic function is normal. The right ventricular size is normal.   3. The mitral valve is normal in structure. No evidence of mitral valve regurgitation. No evidence of mitral stenosis.   4. The aortic valve was not well visualized.   5. The inferior vena cava is normal in size with greater than 50% respiratory variability, suggesting right atrial pressure of 3  mmHg.    10/07/2020 Imaging   CT chest 1. Lung-RADS 4B, suspicious. Additional imaging evaluation or consultation with Pulmonology or Thoracic Surgery recommended. Right middle and lower lobe collapse associated with moderate right pleural effusion. Occlusion of the bronchus intermedius evident. Central obstructing mass lesion a concern. CT chest with contrast recommended to further evaluate. These changes have been slowly progressive since 12/19/2018 and if neoplastic etiology has already been excluded, then given the degree of collapse/consolidation in the right lung, this patient is not a candidate for lung cancer screening. 2. Enlargement of the pulmonary outflow tract and main pulmonary arteries suggests pulmonary arterial hypertension. 3. Aortic Atherosclerosis (ICD10-I70.0).   10/30/2020 Pathology Results   Clinical History: Endobronchial lesion  Specimen Submitted:  B. LUNG, RLL SUPERIOR  , BRUSHING:    FINAL MICROSCOPIC DIAGNOSIS:  - Malignant cells consistent with squamous cell carcinoma    10/30/2020 Pathology Results   FINAL MICROSCOPIC DIAGNOSIS:  - Malignant cells consistent with squamous cell carcinoma    10/30/2020 Procedure   Pre-op Diagnosis: Collapsed RLL   Post-op Diagnosis: Endobronchial mass, RLL superior segment    Surgeon: Leory Plowman L Icard,DO  Operation: Flexible video fiberoptic bronchoscopy and biopsies. Indications and History: Thomas [name] is 65 y.o. with history of RLL collapse, and smoking.  Recommendation was to perform video fiberoptic bronchoscopy with biopsies. The risks, benefits, complications, treatment options and expected outcomes were discussed with the patient.  The possibilities of pneumothorax, pneumonia, reaction to medication, pulmonary aspiration, perforation of a viscus, bleeding, failure to diagnose a condition and creating a complication requiring transfusion or operation were discussed with the patient who freely signed the consent.  Description of Procedure: The patient was seen in the Preoperative Area, was examined and was deemed appropriate to proceed.  The patient was taken to [facility] endoscopy room 2, identified as Tenna Delaine and the procedure verified as Flexible Video Fiberoptic Bronchoscopy.  A Time Out was held and the above information confirmed.   Therapeutic bronchoscope was inserted through the endotracheal tube and a general inspection was performed which showed normal cords, normal trachea, normal main carina. The R sided airways were inspected and showed normal RUL, BI, RML and the right lower lobe superior segment revealed a small endobronchial mass arising from the opening. The L side was then inspected. The LLL, Lingular and LUL airways were normal.    Using the standard therapeutic bronchoscope, and 2.8 mm [employer] forceps biopsies were taken from the right lower lobe superior segment opening and visible endobronchial tumor.  We also completed cytology brushings to the right lower lobe superior segment as well as BAL specimen to be sent for cytology to the right lower lobe.  Following biopsies we switched to the Olympus endobronchial ultrasound scope.  We were able to visualize the right lower lobe area of collapsed lung.  There was no clear identified mass under ultrasound.  I suspect that the lesion that we are seeing just arises from the opening of the right lower lobe superior segment.  Due to the area of atelectasis around the lesion was difficult to identify a discrete mass.  No samples were taken under ultrasound guidance.   Samples: 1. Endobronchial brushings from right lower lobe 2. Endobronchial forceps biopsies from right lower lobe 3. Bronchial washings from right lower lobe   11/11/2020 Initial Diagnosis   Non-small cell lung cancer ([facility])   11/11/2020 Cancer Staging   Staging form: Lung, AJCC 8th Edition - Clinical: Stage IVA (cT4, cN0, cM1a) - Signed by Heath Lark, MD on 11/11/2020    11/25/2020 Genetic Testing   PD-L1 Results:       CANCER STAGING: Cancer Staging Non-small cell lung cancer ([place]) Staging form: Lung, AJCC 8th Edition - Clinical: Stage IVA (cT4, cN0, cM1a) - Signed by Heath Lark, MD on 11/11/2020   INTERVAL HISTORY:  Mr. JAYLEEN AFONSO, a 65 y.o. male, returns for routine follow-up of his non-small cell lung cancer. Loran was last seen on 11/30/2020.   Today he reports feeling well.    REVIEW OF SYSTEMS:  Review of Systems  Constitutional:  Positive for fatigue (20%). Negative for appetite change.  Gastrointestinal:  Positive for constipation.  Musculoskeletal:  Positive for back pain (9/10).  All other systems reviewed and are negative.  PAST MEDICAL/SURGICAL HISTORY:  Past Medical History:  Diagnosis Date   Arthritis    Bone spur    cervical - numbness left arm and hand   CHF (congestive heart failure) (H[facility])    COPD (chronic obstructive pulmonary disease) (H[facility])    Coronary artery disease    Dyspnea    Oxygen 2.5L via [place] qhs and occasionally during the day prn   Fatty liver    GERD (gastroesophageal reflux disease)    Morbid obesity ([facility])    Neuromuscular disorder ([place])    NSTEMI (non-ST elevated myocardial infarction) ([place])    a. 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 EF reported as 20-25% by echo in 03/2018 b. EF at  45% by repeat imaging in 10/2018   Snoring    Type 2 diabetes mellitus [facility])    Past Surgical History:  Procedure Laterality Date   BRONCHIAL BIOPSY  10/30/2020   Procedure: BRONCHIAL BIOPSIES;  Surgeon: Garner Nash, DO;  Location: [facility] ENDOSCOPY;  Service: Pulmonary;;   BRONCHIAL BRUSHINGS  10/30/2020   Procedure: BRONCHIAL BRUSHINGS;  Surgeon: Garner Nash, DO;  Location: [facility] ENDOSCOPY;  Service: Pulmonary;;   BRONCHIAL WASHINGS  10/30/2020   Procedure: BRONCHIAL  WASHINGS;  Surgeon: Garner Nash, DO;  Location: [facility] ENDOSCOPY;  Service: Pulmonary;;   CHOLECYSTECTOMY     COLONOSCOPY     ENDOBRONCHIAL ULTRASOUND  10/30/2020   Procedure: ENDOBRONCHIAL ULTRASOUND;  Surgeon: Garner Nash, DO;  Location: [facility] ENDOSCOPY;  Service: Pulmonary;;   LEFT HEART CATH AND CORONARY ANGIOGRAPHY N/A 03/30/2018   Procedure: LEFT HEART CATH AND CORONARY ANGIOGRAPHY;  Surgeon: Troy Sine, MD;  Location: [facility] CV LAB;  Service: Cardiovascular;  Laterality: N/A;   SHOULDER ARTHROSCOPY Bilateral    TONSILLECTOMY     UPPER GI ENDOSCOPY     VIDEO BRONCHOSCOPY Right 10/30/2020   Procedure: VIDEO BRONCHOSCOPY WITHOUT FLUORO;  Surgeon: Garner Nash, DO;  Location: [facility];  Service: Pulmonary;  Laterality: Right;  possible cryotherapy   WISDOM TOOTH EXTRACTION      SOCIAL HISTORY:  Social History   Socioeconomic History   Marital status: Married    Spouse name: Not on file   Number of children: Not on file   Years of education: Not on file   Highest education level: Not on file  Occupational History   Not on file  Tobacco Use   Smoking status: Every Day    Packs/day: 0.50    Years: 55.00    Pack years: 27.50    Types: Cigarettes    Start date: 02/21/1965   Smokeless tobacco: Never   Tobacco comments:    smokes 10 cigarettes per day 10/28/20  Vaping Use   Vaping Use: Never used  Substance and Sexual Activity   Alcohol use: Not Currently   Drug use: Yes    Types: Marijuana    Comment: Last use 1970's   Sexual activity: Not on file  Other Topics Concern   Not on file  Social History Narrative   Not on file   Social Determinants of Health   Financial Resource Strain: Low Risk    Difficulty of Paying Living Expenses: Not hard at all  Food Insecurity: No Food Insecurity   Worried About Charity fundraiser in the Last Year: Never true   [place] in the Last Year: Never true  Transportation Needs: No Transportation Needs   Lack  of Transportation (Medical): No   Lack of Transportation (Non-Medical): No  Physical Activity: Inactive   Days of Exercise per Week: 0 days   Minutes of Exercise per Session: 0 min  Stress: No Stress Concern Present   Feeling of Stress : Not at all  Social Connections: Moderately Isolated   Frequency of Communication with Friends and Family: More than three times a week   Frequency of Social Gatherings with Friends and Family: More than three times a week   Attends Religious Services: Never   Marine scientist or Organizations: No   Attends Archivist Meetings: Never   Marital Status: Married  Human resources officer Violence: Not At Risk   Fear of Current or Ex-Partner: No   Emotionally Abused: No  Physically Abused: No   Sexually Abused: No    FAMILY HISTORY:  Family History  Problem Relation Age of Onset   Emphysema Father    Heart failure Father     CURRENT MEDICATIONS:  Current Outpatient Medications  Medication Sig Dispense Refill   albuterol (PROVENTIL) (2.5 MG/3ML) 0.083% nebulizer solution Take 3 mLs (2.5 mg total) by nebulization every 6 (six) hours as needed for wheezing or shortness of breath. (Patient not taking: Reported on 11/30/2020) 120 mL 11   albuterol (VENTOLIN HFA) 108 (90 Base) MCG/ACT inhaler Inhale 2 puffs into the lungs every 6 (six) hours as needed for wheezing or shortness of breath. (Patient not taking: Reported on 11/30/2020)     aspirin EC 81 MG tablet Take 81 mg by mouth daily.     atorvastatin (LIPITOR) 80 MG tablet TAKE 1 TABLET (80 MG TOTAL) BY MOUTH DAILY AT 6 PM. 90 tablet 3   canagliflozin (INVOKANA) 300 MG TABS tablet Take 300 mg by mouth daily before breakfast.     carvedilol (COREG) 12.5 MG tablet Take 1 tablet (12.5 mg total) by mouth 2 (two) times daily. 180 tablet 3   clopidogrel (PLAVIX) 75 MG tablet TAKE ONE TABLET BY MOUTH DAILY WITH BREAKFAST 90 tablet 3   furosemide (LASIX) 20 MG tablet Take 1 tablet (20 mg total) by mouth 2  (two) times daily. 180 tablet 3   ibuprofen (ADVIL) 200 MG tablet Take 600 mg by mouth 2 (two) times daily.     isosorbide mononitrate (IMDUR) 60 MG 24 hr tablet TAKE ONE TABLET BY MOUTH EVERY DAY 90 tablet 3   metFORMIN (GLUCOPHAGE-XR) 500 MG 24 hr tablet Take 1,000 mg by mouth 2 (two) times daily.     nitroGLYCERIN (NITROSTAT) 0.4 MG SL tablet Place 1 tablet (0.4 mg total) under the tongue every 5 (five) minutes x 3 doses as needed for chest pain (if no relief after 2nd dose, proceed to the ED for an evaluation or call 911). (Patient not taking: Reported on 11/30/2020) 25 tablet 3   omeprazole (PRILOSEC) 40 MG capsule Take 40 mg by mouth daily.     potassium chloride (KLOR-CON) 10 MEQ tablet Take 1 tablet (10 mEq total) by mouth 2 (two) times daily. 90 tablet 1   sodium chloride (OCEAN) 0.65 % SOLN nasal spray Place 1 spray into both nostrils as needed for congestion.     No current facility-administered medications for this visit.    ALLERGIES:  Allergies  Allergen Reactions   Glipizide Palpitations   Codeine Nausea And Vomiting   Doxycycline Other (See Comments)    GI-Upset    PHYSICAL EXAM:  Performance status (ECOG): 2 - Symptomatic, <50% confined to bed  There were no vitals filed for this visit. Wt Readings from Last 3 Encounters:  11/30/20 281 lb 8 oz (127.7 kg)  11/11/20 279 lb 9.6 oz (126.8 kg)  10/28/20 278 lb (126.1 kg)   Physical Exam Vitals reviewed.  Constitutional:      Appearance: Normal appearance.     Interventions: Nasal cannula in place.  Cardiovascular:     Rate and Rhythm: Normal rate and regular rhythm.     Pulses: Normal pulses.     Heart sounds: Normal heart sounds.  Pulmonary:     Effort: Pulmonary effort is normal.     Breath sounds: Normal breath sounds.  Neurological:     General: No focal deficit present.     Mental Status: He is alert and oriented to  person, place, and time.  Psychiatric:        Mood and Affect: Mood normal.         Behavior: Behavior normal.      LABORATORY DATA:  I have reviewed the labs as listed.  CBC Latest Ref Rng & Units 10/30/2020 05/03/2020 05/01/2020  WBC 4.0 - 10.5 K/uL 6.9 5.8 7.7  Hemoglobin 13.0 - 17.0 g/dL 16.5 17.0 16.1  Hematocrit 39.0 - 52.0 % 55.0(H) 58.9(H) 56.7(H)  Platelets 150 - 400 K/uL 224 188 193   CMP Latest Ref Rng & Units 12/08/2020 10/30/2020 05/05/2020  Glucose 70 - 99 mg/dL - 166(H) 133(H)  BUN 8 - 23 mg/dL - 15 29(H)  Creatinine 0.61 - 1.24 mg/dL 1.10 0.55(L) 0.82  Sodium 135 - 145 mmol/L - 140 141  Potassium 3.5 - 5.1 mmol/L - 4.1 3.6  Chloride 98 - 111 mmol/L - 100 87(L)  CO2 22 - 32 mmol/L - 34(H) 40(H)  Calcium 8.9 - 10.3 mg/dL - 9.0 9.1  Total Protein 6.5 - 8.1 g/dL - - -  Total Bilirubin 0.3 - 1.2 mg/dL - - -  Alkaline Phos 38 - 126 U/L - - -  AST 15 - 41 U/L - - -  ALT 0 - 44 U/L - - -    DIAGNOSTIC IMAGING:  I have independently reviewed the scans and discussed with the patient. CT HEAD W & WO CONTRAST (5MM)  Result Date: 12/09/2020 CLINICAL DATA:  Recent diagnosis non-small cell lung cancer. Staging. EXAM: CT HEAD WITHOUT AND WITH CONTRAST TECHNIQUE: Contiguous axial images were obtained from the base of the skull through the vertex without and with intravenous contrast CONTRAST:  104m OMNIPAQUE IOHEXOL 350 MG/ML SOLN COMPARISON:  None. FINDINGS: Brain: The brain shows a normal appearance without evidence of malformation, atrophy, old or acute small or large vessel infarction, mass lesion, hemorrhage, hydrocephalus or extra-axial collection. After contrast administration, no abnormal enhancement occurs. Vascular: No hyperdense vessel. No evidence of atherosclerotic calcification. Skull: Normal.  No traumatic finding.  No focal bone lesion. Sinuses/Orbits: Ordinary retention cysts of the maxillary sinuses. Orbits negative. Some fluid in the mastoid air cells on both sides. Other: None significant IMPRESSION: Normal examination.  No evidence of metastatic  disease. Retention cysts in the maxillary sinuses. Some fluid in the mastoid air cells on both sides. Electronically Signed   By: MNelson ChimesM.D.   On: 12/09/2020 10:49   NM PET Image Initial (PI) Skull Base To Thigh  Result Date: 11/25/2020 CLINICAL DATA:  Initial treatment strategy for lung cancer. Endobronchial mass on bronchoscopy. EXAM: NUCLEAR MEDICINE PET SKULL BASE TO THIGH TECHNIQUE: 13.6 mCi F-18 FDG was injected intravenously. Full-ring PET imaging was performed from the skull base to thigh after the radiotracer. CT data was obtained and used for attenuation correction and anatomic localization. Fasting blood glucose: 181 mg/dl COMPARISON:  Chest CT 10/07/2020 FINDINGS: Mediastinal blood pool activity: SUV max 2.7 Liver activity: SUV max NA NECK: No hypermetabolic lymph nodes in the neck. Incidental CT findings: 1.6 cm right thyroid nodule. CHEST: Small hypermetabolic focus identified in the posterior aspect of the inferior right hilum, near the region of airway occlusion with SUV max = 7.9. No hypermetabolic mediastinal or left hilar disease. No hypermetabolic axillary lymphadenopathy. Incidental CT findings: Similar appearance right lower lobe and right middle lobe collapse. Stable right pleural effusion. ABDOMEN/PELVIS: No abnormal hypermetabolic activity within the liver, pancreas, adrenal glands, or spleen. No hypermetabolic lymph nodes in the abdomen or pelvis. Incidental  CT findings: Gallbladder surgically absent. Abdominal aortic atherosclerosis. Left colonic diverticulosis without diverticulitis. SKELETON: Small focus hypermetabolism identified posterior left sacrum, indeterminate as no underlying abnormality evident on CT imaging. Incidental CT findings: none IMPRESSION: 1. Small hypermetabolic focus in the inferior right hilum, in the region of the airway occlusion, consistent with the patient's known endobronchial neoplasm. No evidence for hypermetabolic soft tissue metastatic disease in  the chest, abdomen, or pelvis. 2. Tiny focus of low level hypermetabolism the posterior left sacrum, indeterminate. While metastatic disease is considered unlikely, close attention on follow-up is recommended. 3. 1.6 cm right thyroid nodule without hypermetabolism. Recommend thyroid US (ref: J Am Coll Radiol. 2015 Feb;12(2): 143-50). 4.  Aortic Atherosclerois (ICD10-170.0) Electronically Signed   By: Misty Stanley M.D.   On: 11/25/2020 16:51   DG Chest Port 1 View  Result Date: 12/08/2020 CLINICAL DATA:  Post thora , right pleural effusion EXAM: PORTABLE CHEST - 1 VIEW COMPARISON:  04/30/2020 FINDINGS: No pneumothorax. Residual small right pleural effusion. Residual atelectasis/consolidation right lung base. Left lung clear. Heart size normal. Visualized bones unremarkable. IMPRESSION: No pneumothorax post right thoracentesis. Electronically Signed   By: Lucrezia Europe M.D.   On: 12/08/2020 15:03   US THORACENTESIS ASP PLEURAL SPACE W/IMG GUIDE  Result Date: 12/08/2020 INDICATION: Right pleural effusion.  Recent diagnosis of endobronchial neoplasm. EXAM: ULTRASOUND GUIDED RIGHT THORACENTESIS MEDICATIONS: Lidocaine 1% subcutaneous COMPLICATIONS: None immediate.  No pneumothorax on follow-up radiograph. PROCEDURE: An ultrasound guided thoracentesis was thoroughly discussed with the patient and questions answered. The benefits, risks, alternatives and complications were also discussed. The patient understands and wishes to proceed with the procedure. Written consent was obtained. Ultrasound was performed to localize and mark an adequate pocket of fluid in the the right chest. The area was then prepped and draped in the normal sterile fashion. 1% Lidocaine was used for local anesthesia. Under ultrasound guidance a 6 Fr Safe-T-Centesis catheter was introduced. Thoracentesis was performed. The catheter was removed and a dressing applied. FINDINGS: A total of approximately 1.2 L of clear yellow fluid was removed.  Samples were sent to the laboratory as requested by the clinical team. IMPRESSION: Successful ultrasound guided right thoracentesis yielding 1.2 L of pleural fluid. Electronically Signed   By: Lucrezia Europe M.D.   On: 12/08/2020 15:05     ASSESSMENT:  1.  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  Rest of the bronchoscopy was normal. - Right lower lobe brushing consistent with malignant cells, squamous cell carcinoma. - PET scan on 11/25/2020 with small hypermetabolic focus in the posterior aspect of the inferior right hilum, near the region of the airway occlusion SUV max 7.9.  No hypermetabolic mediastinal or left hilar disease.  No hypermetabolic metastatic disease.  Similar appearance of right lower lobe and right middle lobe collapse.  Stable right pleural effusion. - Continuous oxygen requirement since her pneumonia in January 2022.  Denies any hemoptysis.  Denies weight loss.  Neck - PD-L1 TPS (22 C3)-70% - Foundation 1 with PIK3CA amplification, FGF 3/FGF 4 amplification, no EGFR/ALK/RET/BRAF mutations. - Right thoracentesis on 12/08/2020, 1.2 L removed.  Cytology negative for malignancy. - CT head with and without contrast on 12/08/2020 negative for metastatic disease.  2.  Social/family history: - He is a retired Cabin crew.  Exposure to solvents and asbestos present. - Smoked less than 1 pack/day for 50 years.  Current active smoker. - Father  died of mesothelioma.  Paternal uncle had throat cancer.  3 other paternal uncles had cancers.  Brother had small cell lung cancer.  Maternal grandmother had colon cancer.   PLAN:  1.  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 - Pathology reviewed showed 1000 mL of red-colored fluid which was negative for  cytology.  Reactive mesothelial cells seen. - Discussed CT head results from 12/08/2020 which was negative for metastatic disease. - I will reach out to radiation oncology Dr. Lynnette Caffey and make a treatment plan. - He does not report any improvement in his breathing status since thoracentesis was done.   2.  Congestive heart failure: - Last echocardiogram on 04/28/2020 with EF 55 to 60%. - Continue Coreg 12.5 mg twice daily and Lasix 20 mg twice daily. - Continue aspirin and Plavix for medical management of CAD.   Orders placed this encounter:  No orders of the defined types were placed in this encounter.    Derek Jack, MD [place] [phone]   I, Thana Ates, am acting as a scribe for Dr. Derek Jack.  I, Derek Jack MD, have reviewed the above documentation for accuracy and completeness, and I agree with the above.

## 2020-12-17 ENCOUNTER — Other Ambulatory Visit: Payer: Self-pay

## 2020-12-17 ENCOUNTER — Inpatient Hospital Stay (HOSPITAL_COMMUNITY): Payer: Medicare HMO | Attending: Hematology and Oncology | Admitting: Hematology

## 2020-12-17 VITALS — BP 140/64 | HR 102 | Temp 97.3°F | Resp 19 | Wt 281.2 lb

## 2020-12-17 DIAGNOSIS — C3431 Malignant neoplasm of lower lobe, right bronchus or lung: Secondary | ICD-10-CM | POA: Insufficient documentation

## 2020-12-17 DIAGNOSIS — C3490 Malignant neoplasm of unspecified part of unspecified bronchus or lung: Secondary | ICD-10-CM | POA: Diagnosis not present

## 2020-12-17 NOTE — Patient Instructions (Signed)
[facility] at [facility] Discharge Instructions  You were seen today by Dr. Katragadda. He went over your recent results. Dr. Katragadda will see you back in for labs and follow up.   Thank you for choosing [facility] at [facility] to provide your oncology and hematology care.  To afford each patient quality time with our provider, please arrive at least 15 minutes before your scheduled appointment time.   If you have a lab appointment with the Cancer Center please come in thru the Main Entrance and check in at the main information desk  You need to re-schedule your appointment should you arrive 10 or more minutes late.  We strive to give you quality time with our providers, and arriving late affects you and other patients whose appointments are after yours.  Also, if you no show three or more times for appointments you may be dismissed from the clinic at the providers discretion.     Again, thank you for choosing [facility].  Our hope is that these requests will decrease the amount of time that you wait before being seen by our physicians.       _____________________________________________________________  Should you have questions after your visit to [facility], please contact our office at [phone] between the hours of 8:00 a.m. and 4:30 p.m.  Voicemails left after 4:00 p.m. will not be returned until the following business day.  For prescription refill requests, have your pharmacy contact our office and allow 72 hours.    Cancer Center Support Programs:   > Cancer Support Group  2nd Tuesday of the month 1pm-2pm, Journey Room    

## 2021-01-01 ENCOUNTER — Encounter (HOSPITAL_COMMUNITY): Payer: Self-pay

## 2021-01-01 NOTE — Progress Notes (Signed)
Notification received yesterday from [place], [place], that while the patient was in their clinic he voiced that he did not want to pursue chemotherapy. I reached out to the patient directly. Patient is adamant that he does not want any chemotherapy nor immunotherapy. Patient asked to cancel upcoming appts including chemo education, port placement and first day of treatment appts. Dr. Delton Coombes made aware and will follow-up with the patient following radiation completion. Continued coordination will occur between myself and [place], [place].

## 2021-01-04 ENCOUNTER — Inpatient Hospital Stay (HOSPITAL_COMMUNITY): Payer: Medicare HMO

## 2021-01-08 ENCOUNTER — Other Ambulatory Visit (HOSPITAL_COMMUNITY): Payer: Medicare HMO

## 2021-01-12 ENCOUNTER — Ambulatory Visit (HOSPITAL_COMMUNITY): Payer: Medicare HMO | Admitting: Hematology

## 2021-01-12 ENCOUNTER — Ambulatory Visit (HOSPITAL_COMMUNITY): Payer: Medicare HMO

## 2021-01-12 ENCOUNTER — Other Ambulatory Visit (HOSPITAL_COMMUNITY): Payer: Medicare HMO

## 2021-01-13 ENCOUNTER — Ambulatory Visit (HOSPITAL_COMMUNITY): Payer: Medicare HMO

## 2021-01-13 ENCOUNTER — Ambulatory Visit (HOSPITAL_COMMUNITY): Payer: Medicare HMO | Admitting: Hematology

## 2021-01-13 ENCOUNTER — Other Ambulatory Visit (HOSPITAL_COMMUNITY): Payer: Medicare HMO

## 2021-01-25 ENCOUNTER — Encounter: Payer: Self-pay | Admitting: Internal Medicine

## 2021-01-25 ENCOUNTER — Ambulatory Visit (INDEPENDENT_AMBULATORY_CARE_PROVIDER_SITE_OTHER): Payer: Medicare HMO | Admitting: Internal Medicine

## 2021-01-25 ENCOUNTER — Other Ambulatory Visit: Payer: Self-pay

## 2021-01-25 DIAGNOSIS — J9611 Chronic respiratory failure with hypoxia: Secondary | ICD-10-CM | POA: Diagnosis not present

## 2021-01-25 DIAGNOSIS — J9612 Chronic respiratory failure with hypercapnia: Secondary | ICD-10-CM

## 2021-01-25 DIAGNOSIS — J449 Chronic obstructive pulmonary disease, unspecified: Secondary | ICD-10-CM | POA: Diagnosis not present

## 2021-01-25 NOTE — Assessment & Plan Note (Signed)
Active smoker 06/22/2020  After extensive coaching inhaler device,  effectiveness =    90% with elipta > try trelegy > no better as of 08/21/2020 so just rec prn saba  - - The proper method of use, as well as anticipated side effects, of a metered-dose inhaler were discussed and demonstrated to the patient using teach back method. Improved effectiveness after extensive coaching during this visit to a level of approximately 75 % from a baseline of < 50 %  So continue saba hfa and back up with neb as says can't afford maint rx   Re SABA :  I spent extra time with pt today reviewing appropriate use of albuterol for prn use on exertion with the following points: 1) saba is for relief of sob that does not improve by walking a slower pace or resting but rather if the pt does not improve after trying this first. 2) If the pt is convinced, as many are, that saba helps recover from activity faster then it's easy to tell if this is the case by re-challenging : ie stop, take the inhaler, then p 5 minutes try the exact same activity (intensity of workload) that just caused the symptoms and see if they are substantially diminished or not after saba 3) if there is an activity that reproducibly causes the symptoms, try the saba 15 min before the activity on alternate days   If in fact the saba really does help, then fine to continue to use it prn but advised may need to look closer at the maintenance regimen being used to achieve better control of airways disease with exertion.   Most affordable maint  rx probably wixella but advised re formulary restrictions > return with formulary if not happy with prn saba

## 2021-01-25 NOTE — Patient Instructions (Addendum)
Make sure you check your oxygen saturation  at your highest level of activity  to be sure it stays over 90% and adjust  02 flow upward to maintain this level if needed but remember to turn it back to previous settings when you stop (to conserve your supply).   Work on inhaler technique:  relax and gently blow all the way out then take a nice smooth full deep breath back in, triggering the inhaler at same time you start breathing in.  Hold for up to 5 seconds if you can. Blow out thru nose. Rinse and gargle with water when done.  If mouth or throat bother you at all,  try brushing teeth/gums/tongue with arm and hammer toothpaste/ make a slurry and gargle and spit out.     Ok to try albuterol 15 min before an activity (on alternating days with the neb )  that you know would usually make you short of breath and see if it makes any difference and if makes none then don't take albuterol after activity unless you can't catch your breath as this means it's the resting that helps, not the albuterol.   Please schedule a follow up visit in 3 months with PFTs on return

## 2021-01-25 NOTE — Assessment & Plan Note (Signed)
HC03  05/05/20  = 40  -  06/22/2020   Walked RA  approx   300 ft  @ moderate pace  stopped due to  A leg and back pain/no SOB with sats still 90% at end    -  08/21/2020   Walked RA  approx   300 ft  @ slow pace  stopped due to  Tired/ sats still 87% RA -  01/25/2021   Walked on 2lpm cont x  3  lap(s) =  approx 450 @ slow pace, stopped due to end of study  with lowest 02 sats  89% so rec for inogen 2lpm     Each maintenance medication was reviewed in detail including emphasizing most importantly the difference between maintenance and prns and under what circumstances the prns are to be triggered using an action plan format where appropriate.  Total time for H and P, chart review, counseling, reviewing hfa/neb/02  device(s) , directly observing portions of ambulatory 02 saturation study/ and generating customized AVS unique to this office visit / same day charting > 30 min

## 2021-01-25 NOTE — Progress Notes (Signed)
Thomas [name], male    DOB: 1955/09/29     MRN: [id number]   Brief patient profile:  67 yowm active smoker can't afford maint rx   Admit date: 04/27/2020 Discharge date: 05/05/2020   Recommendations for Outpatient Follow-up:  Follow up with PCP in 1-2 weeks Follow-up BMP and CBC in 1 week Follow-up with pulmonology outpatient in 2 weeks for outpatient PFTs and set up for sleep study Continue on home oxygen as prescribed 4 L 24/7 Prednisone taper over 2 weeks as prescribed Continue now on increased dose of Lasix 40 mg twice daily Continue other home medications as prior   Equipment/Devices: Home oxygen 4 L nasal cannula     Brief/Interim Summary: Thomas [name] is a 65 y.o. male with medical history significant for systolic CHF/ischemic cardiomyopathy, COPD, diabetes mellitus. Patient presented to the ED with complaints of increasing difficulty breathing over the past 2 months, productive cough.  Patient was initially admitted with acute hypoxemic respiratory failure that appears multifactorial in the setting of pneumonia and COPD as well as acute on chronic heart failure.  Unfortunately, he has had worsening oxygen saturations and dyspnea noted overnight on 1/11 prompting transfer to ICU and placement on BiPAP.  He has been weaned off BiPAP and is currently requiring 4 L nasal cannula oxygen. Patient was treated for COPD as well as acute on chronic diastolic congestive heart failure exacerbation and has had excellent diuresis during this admission of near 20L-this is course, took several days. He is down to a weight of 269 pounds and his usual baseline at home is around 280. His renal function has remained stable and he has completed his antibiotic course for pneumonia. . He has had PT evaluation with no home recommendations. He will require 4 L home nasal cannula oxygen which will be arranged. His home Lasix dose will be increased to 40 mg twice daily from 20 mg twice daily. His daughter who  stays with him is a nurse will ensure that he follows his appropriate diet and weighs himself daily. Pulmonology to follow-up with him outpatient to consider trilogy machine.   Discharge Diagnoses:  Principal Problem:   PNA (pneumonia) Active Problems:   Morbid obesity ([place])   Ischemic cardiomyopathy   Tobacco abuse   DM (diabetes mellitus) ([place])   Acute respiratory failure with hypoxia and hypercapnia (HCC)   Acute on chronic systolic CHF (congestive heart failure) (HCC)   Acute respiratory failure with hypoxia (HCC)   Acute on chronic respiratory failure with hypoxia and hypercapnia (HCC)   Principal discharge diagnosis: Acute on chronic hypoxemic and hypercapnic respiratory failure-multifactorial in setting of acute on chronic diastolic CHF exacerbation as well as COPD exacerbation with pneumonia.     History of Present Illness  06/22/2020  Pulmonary/ 1st office eval/ Teryl Mcconaghy / [facility]  Chief Complaint  Patient presents with   Pulmonary Consult    Referred by Dr. Manuella Ghazi. Pt states hospitalized for PNA  Jan 2022. He denies having any SOB. He states "I always have a cough, I smoke".  He was placed on supplemental o2 after recent hospitalization. He is using his albuterol inhaler 2 x per wk on average.   Dyspnea:  [employer] sev aisles and then R>L knee pain limiting  Cough: nasal congestion in am despite water in chamber Sleep: on R side bed is flat/ 3 pillows  SABA use: rarely needing  rec Plan A = Automatic = Always=    Trelegy 100   One cick  each  Am - two good drags and out thru the nose  Plan B = Backup (to supplement plan A, not to replace it) Only use your albuterol inhaler as a rescue medication Please schedule a follow up office visit in 6 weeks, call sooner if needed with PFTs - bring inhalers and your drug formulary (provided by your insurance company, not me)    08/21/2020  f/u ov/[place] office/Tameka Hoiland re: copd ? Stage/ hypercarbic ? OHS/still smoking  Chief  Complaint  Patient presents with   Follow-up    No complaints currently    Dyspnea:  Limited more by knee than breathing  Cough: worse after eating, sometimes chokes  Sleeping: flat bed on side 3 pillows / says feels fine in am  SABA use: once or twice for cough  02: 02 3lpm hs and prn  Covid status: vax x 3  Lung cancer screening: last CT 12/19/2018 > referred for [facility] shared decision making Pain under L rib better p flatus x 2011 never supine  Rec I will refer you today to [name] who runs the lung cancer screening program for shared decision making > pos RLL endobronchial dz > referred to [name]  Classic subdiaphragmatic pain pattern suggests ibs:  Treatment consists of avoiding foods that cause gas (especially boiled eggs, mexcican food but especially  beans and undercooked vegetables like  spinach and some salads)  and citrucel 1 heaping tsp twice daily with a large glass of water.  Pain should improve w/in 2 weeks and if not then consider further GI work up.   Be sure you take omeprazole 40 mg Take 30-60 min before first meal of the day  Only use your albuterol as a rescue medication The key is to stop smoking completely before smoking completely stops you! 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   SOB and cough have been worse since doing biopsy and during radiation treatments   Wants to discuss getting an inogen machine.   Dyspnea:  knee still slowing him down/  hc parking/ mb and back to house with 02 2lpm pulsing sats 89%  more of  incline toward the mailbox  Cough: none  Sleeping: flat bed 3 pillows  SABA use: as above  02: 2lpm hs and pulse  Covid status: vax x 3  RT thru Feb 23 2021 / no chemo      No obvious day to day or daytime variability or assoc excess/ purulent sputum or mucus plugs or hemoptysis or cp or chest tightness, subjective wheeze or overt sinus or hb symptoms.   Sleeping as above without nocturnal  or early am exacerbation  of respiratory  c/o's or need for noct saba. Also denies any obvious fluctuation of symptoms with weather or environmental changes or other aggravating or alleviating factors except as outlined above   No unusual exposure hx or h/o childhood pna/ asthma or knowledge of premature birth.  Current Allergies, Complete  Past Medical History, Past Surgical History, Family History, and Social History were reviewed in [employer] record.  ROS  The following are not active complaints unless bolded Hoarseness, sore throat, dysphagia, dental problems, itching, sneezing,  nasal congestion or discharge of excess mucus or purulent secretions, ear ache,   fever, chills, sweats, unintended wt loss or wt gain, classically pleuritic or exertional cp,  orthopnea pnd or arm/hand swelling  or leg swelling, presyncope, palpitations, abdominal pain, anorexia, nausea, vomiting, diarrhea  or change in bowel habits or change in bladder habits, change in stools or change in urine, dysuria, hematuria,  rash, arthralgias, visual complaints, headache, numbness, weakness or ataxia or problems with walking or coordination,  change in mood or  memory.        Current Meds  Medication Sig   albuterol (PROVENTIL) (2.5 MG/3ML) 0.083% nebulizer solution Take 3 mLs (2.5 mg total) by nebulization every 6 (six) hours as needed for wheezing or shortness of breath.   albuterol (VENTOLIN HFA) 108 (90 Base) MCG/ACT inhaler Inhale 2 puffs into the lungs every 6 (six) hours as needed for wheezing or  shortness of breath.   aspirin EC 81 MG tablet Take 81 mg by mouth daily.   atorvastatin (LIPITOR) 80 MG tablet TAKE 1 TABLET (80 MG TOTAL) BY MOUTH DAILY AT 6 PM.   canagliflozin (INVOKANA) 300 MG TABS tablet Take 300 mg by mouth daily before breakfast.   carvedilol (COREG) 12.5 MG tablet Take 1 tablet (12.5 mg total) by mouth 2 (two) times daily.   clopidogrel (PLAVIX) 75 MG tablet TAKE ONE TABLET BY MOUTH DAILY WITH BREAKFAST   furosemide (LASIX) 20 MG tablet Take 1 tablet (20 mg total) by mouth 2 (two) times daily.   ibuprofen (ADVIL) 200 MG tablet Take 600 mg by mouth 2 (two) times daily.   isosorbide mononitrate (IMDUR) 60 MG 24 hr tablet TAKE ONE TABLET BY MOUTH EVERY DAY   ketoconazole (NIZORAL) 2 % cream 1 application to groin   meloxicam (MOBIC) 7.5 MG tablet 1 tablet   metFORMIN (GLUCOPHAGE-XR) 500 MG 24 hr tablet Take 1,000 mg by mouth 2 (two) times daily.   NEOMYCIN-POLYMYXIN-HYDROCORTISONE (CORTISPORIN) 1 % SOLN OTIC solution 4 drops into affected ear   nitroGLYCERIN (NITROSTAT) 0.4 MG SL tablet Place 1 tablet (0.4 mg total) under the tongue every 5 (five) minutes x 3 doses as needed for chest pain (if no relief after 2nd dose, proceed to the ED for an evaluation or call 911).   omeprazole (PRILOSEC) 40 MG capsule Take 40 mg by mouth daily.   potassium chloride (KLOR-CON) 10 MEQ tablet Take 1 tablet (10 mEq total) by mouth 2 (two) times daily.   sodium chloride (OCEAN) 0.65 % SOLN nasal spray Place 1 spray into both nostrils as needed for congestion.                   Past Medical History:  Diagnosis Date   CHF (congestive heart failure) (HCC)    COPD (chronic obstructive pulmonary disease) (HCC)    Coronary artery disease    Morbid obesity ([place])    NSTEMI (non-ST elevated myocardial infarction) ([place])    a. s/p cath in 03/2018 showing 90% OM2 stenosis and nonobstructive CAD along LAD and LCx with medical management recommended   Pneumonia    Secondary cardiomyopathy  ([place])    a. EF reported as 20-25% by echo in 03/2018 b. EF at 45% by repeat imaging in 10/2018   Snoring  Type 2 diabetes mellitus (HCC)       Objective:     01/25/2021     270   08/21/20 283 lb 9.6 oz (128.6 kg)  08/18/20 284 lb (128.8 kg)  06/22/20 284 lb (128.8 kg)       Vital signs reviewed  01/25/2021  - Note at rest 02 sats  89% on 2lpm    General appearance:    chronically ill  obese wm nad on rollator   HEENT : pt wearing mask not removed for exam due to covid -19 concerns.    NECK :  without JVD/Nodes/TM/ nl carotid upstrokes bilaterally   LUNGS: no acc muscle use,  Mod barrel  contour chest wall with bilateral  Distant bs s audible wheeze and  without cough on insp or exp maneuvers and mod  Hyperresonant  to  percussion bilaterally     CV:  RRR  no s3 or murmur or increase in P2, and no edema   ABD:  soft and nontender with pos mid insp Hoover's  in the supine position. No bruits or organomegaly appreciated, bowel sounds nl  MS:     ext warm without deformities, calf tenderness, cyanosis or clubbing No obvious joint restrictions   SKIN: warm and dry without lesions    NEURO:  alert, approp, nl sensorium with  no motor or cerebellar deficits apparent.         I personally reviewed images and agree with radiology impression as follows:  CXR:   portable 12/08/20  No pneumothorax post right thoracentesis.        Assessment

## 2021-02-23 ENCOUNTER — Encounter: Payer: Self-pay | Admitting: *Deleted

## 2021-02-23 ENCOUNTER — Encounter: Payer: Self-pay | Admitting: Cardiology

## 2021-02-23 ENCOUNTER — Ambulatory Visit: Payer: Medicare HMO | Admitting: Cardiology

## 2021-02-23 VITALS — BP 120/74 | HR 100 | Ht 69.0 in | Wt 274.6 lb

## 2021-02-23 DIAGNOSIS — I25119 Atherosclerotic heart disease of native coronary artery with unspecified angina pectoris: Secondary | ICD-10-CM | POA: Diagnosis not present

## 2021-02-23 DIAGNOSIS — Z8679 Personal history of other diseases of the circulatory system: Secondary | ICD-10-CM | POA: Diagnosis not present

## 2021-02-23 NOTE — Progress Notes (Signed)
Cardiology Office Note  Date: 02/23/2021   ID: Thomas [name], DOB Mar 02, 1956, MRN [id number]  PCP:  Thomas Douglas, MD  Cardiologist:  Thomas Lesches, MD Electrophysiologist:  None   Chief Complaint  Patient presents with   Cardiac follow-up    History of Present Illness: Thomas [name] is a 65 y.o. male last seen in May.  He is here for a follow-up visit.  I reviewed interval records.  He continues to follow with Dr. Melvyn [name] and Dr. Delton [name].  He is being treated for non-small cell lung cancer, undergoing radiation treatments actively.  Reports chronic fatigue and shortness of breath, no changes.  NYHA class III symptoms.  He continues to wear supplemental oxygen.  From a cardiac perspective he does not describe any active angina.  He had an echocardiogram in January of this year that revealed normal LVEF at 55 to 60%.  I reviewed his current cardiac regimen as noted below.  We are requesting interval lab work from PCP.  Past Medical History:  Diagnosis Date   Arthritis    Bone spur    cervical - numbness left arm and hand   CHF (congestive heart failure) (HCC)    COPD (chronic obstructive pulmonary disease) (HCC)    Coronary artery disease    Dyspnea    Oxygen 2.5L via [place] qhs and occasionally during the day prn   Fatty liver    GERD (gastroesophageal reflux disease)    Morbid obesity ([place])    Neuromuscular disorder ([place])    NSTEMI (non-ST elevated myocardial infarction) ([place])    a. s/p cath in 03/2018 showing 90% OM2 stenosis and nonobstructive CAD along LAD and LCx with medical management recommended   Pneumonia    x2   Secondary cardiomyopathy ([place])    a. EF reported as 20-25% by echo in 03/2018 b. EF at 45% by repeat imaging in 10/2018   Snoring    Type 2 diabetes mellitus ([place])     Past Surgical History:  Procedure Laterality Date   BRONCHIAL BIOPSY  10/30/2020   Procedure: BRONCHIAL BIOPSIES;  Surgeon: Thomas Nash, DO;  Location: [facility];   Service: Pulmonary;;   BRONCHIAL BRUSHINGS  10/30/2020   Procedure: BRONCHIAL BRUSHINGS;  Surgeon: Thomas Nash, DO;  Location: [facility];  Service: Pulmonary;;   BRONCHIAL WASHINGS  10/30/2020   Procedure: BRONCHIAL WASHINGS;  Surgeon: Thomas Nash, DO;  Location: [facility];  Service: Pulmonary;;   CHOLECYSTECTOMY     COLONOSCOPY     ENDOBRONCHIAL ULTRASOUND  10/30/2020   Procedure: ENDOBRONCHIAL ULTRASOUND;  Surgeon: Thomas Nash, DO;  Location: [facility];  Service: Pulmonary;;   LEFT HEART CATH AND CORONARY ANGIOGRAPHY N/A 03/30/2018   Procedure: LEFT HEART CATH AND CORONARY ANGIOGRAPHY;  Surgeon: Thomas Sine, MD;  Location: [facility];  Service: Cardiovascular;  Laterality: N/A;   SHOULDER ARTHROSCOPY Bilateral    TONSILLECTOMY     UPPER GI ENDOSCOPY     VIDEO BRONCHOSCOPY Right 10/30/2020   Procedure: VIDEO BRONCHOSCOPY WITHOUT FLUORO;  Surgeon: Thomas Nash, DO;  Location: [facility];  Service: Pulmonary;  Laterality: Right;  possible cryotherapy   WISDOM TOOTH EXTRACTION      Current Outpatient Medications  Medication Sig Dispense Refill   albuterol (PROVENTIL) (2.5 MG/3ML) 0.083% nebulizer solution Take 3 mLs (2.5 mg total) by nebulization every 6 (six) hours as needed for wheezing or shortness of breath. 120 mL 11   albuterol (VENTOLIN HFA) 108 (90 Base)  MCG/ACT inhaler Inhale 2 puffs into the lungs every 6 (six) hours as needed for wheezing or shortness of breath.     aspirin EC 81 MG tablet Take 81 mg by mouth daily.     atorvastatin (LIPITOR) 80 MG tablet TAKE 1 TABLET (80 MG TOTAL) BY MOUTH DAILY AT 6 PM. 90 tablet 3   canagliflozin (INVOKANA) 300 MG TABS tablet Take 300 mg by mouth daily before breakfast.     carvedilol (COREG) 12.5 MG tablet Take 1 tablet (12.5 mg total) by mouth 2 (two) times daily. 180 tablet 3   clopidogrel (PLAVIX) 75 MG tablet TAKE ONE TABLET BY MOUTH DAILY WITH BREAKFAST 90 tablet 3   furosemide (LASIX) 20 MG tablet  Take 1 tablet (20 mg total) by mouth 2 (two) times daily. 180 tablet 3   ibuprofen (ADVIL) 200 MG tablet Take 600 mg by mouth 2 (two) times daily.     ipratropium-albuterol (DUONEB) 0.5-2.5 (3) MG/3ML SOLN 3 mL as needed     isosorbide mononitrate (IMDUR) 60 MG 24 hr tablet TAKE ONE TABLET BY MOUTH EVERY DAY 90 tablet 3   ketoconazole (NIZORAL) 2 % cream 1 application to groin     metFORMIN (GLUCOPHAGE-XR) 500 MG 24 hr tablet Take 1,000 mg by mouth 2 (two) times daily.     NEOMYCIN-POLYMYXIN-HYDROCORTISONE (CORTISPORIN) 1 % SOLN OTIC solution 4 drops into affected ear     nitroGLYCERIN (NITROSTAT) 0.4 MG SL tablet Place 1 tablet (0.4 mg total) under the tongue every 5 (five) minutes x 3 doses as needed for chest pain (if no relief after 2nd dose, proceed to the ED for an evaluation or call 911). 25 tablet 3   omeprazole (PRILOSEC) 40 MG capsule Take 40 mg by mouth daily.     potassium chloride (KLOR-CON) 10 MEQ tablet Take 1 tablet (10 mEq total) by mouth 2 (two) times daily. 90 tablet 1   sodium chloride (OCEAN) 0.65 % SOLN nasal spray Place 1 spray into both nostrils as needed for congestion.     meloxicam (MOBIC) 7.5 MG tablet 1 tablet (Patient not taking: Reported on 02/23/2021)     sodium chloride (OCEAN) 0.65 % nasal spray Place into the nose.     No current facility-administered medications for this visit.   Allergies:  Glipizide, Codeine, and Doxycycline   ROS: No palpitations or syncope.  Physical Exam: VS:  BP 120/74 (BP Location: Left Arm, Patient Position: Sitting, Cuff Size: Normal)   Pulse 100   Ht 5\' 9"  (1.753 m)   Wt 274 lb 9.6 oz (124.6 kg)   SpO2 92%   BMI 40.55 kg/m , BMI Body mass index is 40.55 kg/m.  Wt Readings from Last 3 Encounters:  02/23/21 274 lb 9.6 oz (124.6 kg)  01/25/21 270 lb (122.5 kg)  12/17/20 281 lb 3.2 oz (127.6 kg)    General: Patient appears comfortable at rest.  Wearing oxygen via nasal cannula HEENT: Conjunctiva and lids normal, wearing a  mask. Neck: Supple, no elevated JVP or carotid bruits, no thyromegaly. Lungs: Decreased basilar breath sounds, no active wheezing. Cardiac: Regular rate and rhythm, no S3 or significant systolic murmur, no pericardial rub. Extremities: Chronic appearing lower leg edema.  ECG:  An ECG dated 04/27/2020 was personally reviewed today and demonstrated:  Sinus rhythm with poor R wave progression and lead motion artifact.  Recent Labwork: 04/27/2020: ALT 49; AST 23; B Natriuretic Peptide 149.0 04/28/2020: TSH 0.481 05/05/2020: Magnesium 2.4 10/30/2020: BUN 15; Hemoglobin 16.5; Platelets 224;  Potassium 4.1; Sodium 140 12/08/2020: Creatinine, Ser 1.10   Other Studies Reviewed Today:  Echocardiogram 04/28/2020:  1. Left ventricular ejection fraction, by estimation, is 55 to 60%. The  left ventricle has normal function. The left ventricle has no regional  wall motion abnormalities. Left ventricular diastolic parameters were  normal.   2. Right ventricular systolic function is normal. The right ventricular  size is normal.   3. The mitral valve is normal in structure. No evidence of mitral valve  regurgitation. No evidence of mitral stenosis.   4. The aortic valve was not well visualized.   5. The inferior vena cava is normal in size with greater than 50%  respiratory variability, suggesting right atrial pressure of 3 mmHg.   Assessment and Plan:  1.  Branch vessel CAD without active angina symptoms.  Plan to continue medical therapy and observation.  He continues on aspirin, Plavix, Lipitor, Imdur, and Coreg.  2.  History of ischemic cardiomyopathy, LVEF normal at 55 to 60% by echocardiogram in January of this year.  3.  Non-small cell lung cancer, followed by Dr. Delton [name] and currently undergoing XRT.  Medication Adjustments/Labs and Tests Ordered: Current medicines are reviewed at length with the patient today.  Concerns regarding medicines are outlined above.   Tests Ordered: No orders  of the defined types were placed in this encounter.   Medication Changes: No orders of the defined types were placed in this encounter.   Disposition:  Follow up  6 months.  Signed, Satira Sark, MD, [facility] 02/23/2021 2:16 PM    [facility] at [place], [place], [place] [address] Phone: [phone]; Fax: [phone]

## 2021-02-23 NOTE — Patient Instructions (Addendum)

## 2021-04-01 ENCOUNTER — Other Ambulatory Visit: Payer: Self-pay | Admitting: Cardiology

## 2021-04-21 ENCOUNTER — Telehealth: Payer: Self-pay | Admitting: Internal Medicine

## 2021-04-21 MED ORDER — AZITHROMYCIN 250 MG PO TABS: ORAL_TABLET | ORAL | 0 refills | Status: DC

## 2021-04-21 MED ORDER — PREDNISONE 10 MG PO TABS: ORAL_TABLET | ORAL | 0 refills | Status: DC

## 2021-04-21 NOTE — Telephone Encounter (Signed)
Called and spoke with patient. He stated that he wanted to let MW know that he had to increase his O2 to 4L yesterday. His O2 levels had dropped into the 70s. Once he increased his O2 to 4L, his O2 recovered to 92%. He has had a cough for the past 3-4 days, productive with green phlegm. Denied any fevers, body aches or being around anyone who has been sick recently.   Pharmacy is [facility].   I advised him that since he was having issues with his O2 dropping that low, he would need to go to the hospital. He refused and wanted recommendations first.   SG, can you please advise since MW is not in clinic this afternoon. Thanks!

## 2021-04-21 NOTE — Telephone Encounter (Signed)
Called and spoke with patient. He verbalized understanding. Meds have been sent in. He is aware that if his O2 does not recover he will need to go to the ED.   Nothing further needed at time of call.

## 2021-04-21 NOTE — Telephone Encounter (Signed)
We can do a zpak and Prednisone 10 mg take  4 each am x 2 days,   2 each am x 2 days,  1 each am x 2 days and stop but if can't get comfortable at rest on 02 with sats > 89% then agree need to go to ER

## 2021-04-21 NOTE — Telephone Encounter (Signed)
Called and spoke with patient. He stated that he is not able to have a CXR until February 2023 due to radiation. He went to UC on Monday and was prescribed amoxicillin. He refuses to go to the ED.   I advised him that MW is not here this afternoon. He stated that he was ok to wait until MW returns tomorrow.   MW, can you please advise?

## 2021-05-18 ENCOUNTER — Ambulatory Visit: Payer: Medicare HMO | Admitting: Internal Medicine

## 2021-05-18 NOTE — Progress Notes (Deleted)
Thomas [name], male    DOB: 1955/12/11     MRN: [id number]   Brief patient profile:  89 yowm active smoker can't afford maint rx   Admit date: 04/27/2020 Discharge date: 05/05/2020   Recommendations for Outpatient Follow-up:  Follow up with PCP in 1-2 weeks Follow-up BMP and CBC in 1 week Follow-up with pulmonology outpatient in 2 weeks for outpatient PFTs and set up for sleep study Continue on home oxygen as prescribed 4 L 24/7 Prednisone taper over 2 weeks as prescribed Continue now on increased dose of Lasix 40 mg twice daily Continue other home medications as prior   Equipment/Devices: Home oxygen 4 L nasal cannula     Brief/Interim Summary: Thomas [name] is a 66 y.o. male with medical history significant for systolic CHF/ischemic cardiomyopathy, COPD, diabetes mellitus. Patient presented to the ED with complaints of increasing difficulty breathing over the past 2 months, productive cough.  Patient was initially admitted with acute hypoxemic respiratory failure that appears multifactorial in the setting of pneumonia and COPD as well as acute on chronic heart failure.  Unfortunately, he has had worsening oxygen saturations and dyspnea noted overnight on 1/11 prompting transfer to ICU and placement on BiPAP.  He has been weaned off BiPAP and is currently requiring 4 L nasal cannula oxygen. Patient was treated for COPD as well as acute on chronic diastolic congestive heart failure exacerbation and has had excellent diuresis during this admission of near 20L-this is course, took several days. He is down to a weight of 269 pounds and his usual baseline at home is around 280. His renal function has remained stable and he has completed his antibiotic course for pneumonia. . He has had PT evaluation with no home recommendations. He will require 4 L home nasal cannula oxygen which will be arranged. His home Lasix dose will be increased to 40 mg twice daily from 20 mg twice daily. His daughter who  stays with him is a nurse will ensure that he follows his appropriate diet and weighs himself daily. Pulmonology to follow-up with him outpatient to consider trilogy machine.   Discharge Diagnoses:  Principal Problem:   PNA (pneumonia) Active Problems:   Morbid obesity ([place])   Ischemic cardiomyopathy   Tobacco abuse   DM (diabetes mellitus) ([place])   Acute respiratory failure with hypoxia and hypercapnia (HCC)   Acute on chronic systolic CHF (congestive heart failure) (HCC)   Acute respiratory failure with hypoxia (HCC)   Acute on chronic respiratory failure with hypoxia and hypercapnia (HCC)   Principal discharge diagnosis: Acute on chronic hypoxemic and hypercapnic respiratory failure-multifactorial in setting of acute on chronic diastolic CHF exacerbation as well as COPD exacerbation with pneumonia.     History of Present Illness  06/22/2020  Pulmonary/ 1st office eval/ Thomas [name] / [facility]  Chief Complaint  Patient presents with   Pulmonary Consult    Referred by Dr. Manuella Ghazi. Pt states hospitalized for PNA  Jan 2022. He denies having any SOB. He states "I always have a cough, I smoke".  He was placed on supplemental o2 after recent hospitalization. He is using his albuterol inhaler 2 x per wk on average.   Dyspnea:  Food lion sev aisles and then R>L knee pain limiting  Cough: nasal congestion in am despite water in chamber Sleep: on R side bed is flat/ 3 pillows  SABA use: rarely needing  rec Plan A = Automatic = Always=    Trelegy 100   One cick  each  Am - two good drags and out thru the nose  Plan B = Backup (to supplement plan A, not to replace it) Only use your albuterol inhaler as a rescue medication Please schedule a follow up office visit in 6 weeks, call sooner if needed with PFTs - bring inhalers and your drug formulary (provided by your insurance company, not me)    08/21/2020  f/u ov/Thomas [name] office/Thomas [name] re: copd ? Stage/ hypercarbic ? OHS/still smoking  Chief  Complaint  Patient presents with   Follow-up    No complaints currently    Dyspnea:  Limited more by knee than breathing  Cough: worse after eating, sometimes chokes  Sleeping: flat bed on side 3 pillows / says feels fine in am  SABA use: once or twice for cough  02: 02 3lpm hs and prn  Covid status: vax x 3  Lung cancer screening: last CT 12/19/2018 > referred for [facility] shared decision making Pain under L rib better p flatus x 2011 never supine  Rec I will refer you today to Thomas Form NP who runs the lung cancer screening program for shared decision making > pos RLL endobronchial dz > referred to Thomas [name]  Classic subdiaphragmatic pain pattern suggests ibs:  Treatment consists of avoiding foods that cause gas (especially boiled eggs, mexcican food but especially  beans and undercooked vegetables like  spinach and some salads)  and citrucel 1 heaping tsp twice daily with a large glass of water.  Pain should improve w/in 2 weeks and if not then consider further GI work up.   Be sure you take omeprazole 40 mg Take 30-60 min before first meal of the day  Only use your albuterol as a rescue medication The key is to stop smoking completely before smoking completely stops you! 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   SOB and cough have been worse since doing biopsy and during radiation treatments   Wants to discuss getting an inogen machine.   Dyspnea:  knee still slowing him down/  hc parking/ mb and back to house with 02 2lpm pulsing sats 89%  more of  incline toward the mailbox  Cough: none  Sleeping: flat bed 3 pillows  SABA use: as above  02: 2lpm hs and pulse  Covid status: vax x 3  RT thru Feb 23 2021 / no chemo  Rec Make sure you check your oxygen saturation  at your highest level of activity   Work on inhaler technique:  r Ok to try albuterol 15 min before an activity (on alternating days with the neb )  that you know would usually make you short of breath  Please schedule a follow up visit in 3 months with PFTs on return     05/18/2021  f/u ov/[place] office/Rashad Obeid re: GOLD 0/ on 02  maint on ***  No chief complaint on file.   Dyspnea:  *** Cough: *** Sleeping: *** SABA use: ***  02: *** Covid status: *** Lung cancer screening: ***   No obvious day to day or daytime variability or assoc excess/ purulent sputum or mucus plugs or hemoptysis or cp or chest tightness, subjective wheeze or overt sinus or hb symptoms.   *** without nocturnal  or early am exacerbation  of respiratory  c/o's or need for noct saba. Also denies any obvious fluctuation of symptoms with weather or environmental changes or other aggravating or alleviating factors except as outlined above   No unusual exposure hx or h/o childhood pna/ asthma or knowledge of premature birth.  Current Allergies, Complete Past Medical History, Past Surgical History, Family History, and Social History were reviewed in [employer] record.  ROS  The following are not active complaints unless bolded Hoarseness, sore throat, dysphagia, dental problems, itching, sneezing,  nasal congestion or discharge of excess mucus or purulent secretions, ear ache,   fever, chills, sweats, unintended wt loss or wt gain, classically pleuritic or exertional cp,  orthopnea pnd or arm/hand swelling  or leg swelling, presyncope, palpitations, abdominal pain, anorexia, nausea, vomiting, diarrhea  or change in bowel habits or change in bladder habits, change in stools or  change in urine, dysuria, hematuria,  rash, arthralgias, visual complaints, headache, numbness, weakness or ataxia or problems with walking or coordination,  change in mood or  memory.        No outpatient medications have been marked as taking for the 05/18/21 encounter (Appointment) with Tanda Rockers, MD.                     Past Medical History:  Diagnosis Date   CHF (congestive heart failure) ([place])    COPD (chronic obstructive pulmonary disease) ([place])    Coronary artery disease    Morbid obesity ([place])    NSTEMI (non-ST elevated myocardial infarction) ([place])    a. s/p cath in 03/2018 showing 90% OM2 stenosis and nonobstructive CAD along LAD and LCx with medical management recommended   Pneumonia    Secondary cardiomyopathy ([place])    a. EF reported as 20-25% by echo in 03/2018 b. EF at 45% by repeat imaging in 10/2018   Snoring    Type 2 diabetes mellitus ([place])       Objective:    Wts  05/18/2021        ***  01/25/2021     270   08/21/20 283 lb 9.6 oz (128.6 kg)  08/18/20 284 lb (128.8 kg)  06/22/20 284 lb (128.8 kg)    Vital signs reviewed  05/18/2021  - Note at rest 02 sats  ***% on ***   General appearance:    ***     Mod bar***            Assessment

## 2021-05-25 ENCOUNTER — Telehealth: Payer: Self-pay | Admitting: Internal Medicine

## 2021-05-25 NOTE — Telephone Encounter (Signed)
Has hypercarbia no carbon monoxide so needs to be referred to [place] next available as I don't do bipap or noct vents  - I can see him post op if they are not available but go ahead and do the appt for one of the sleep docs

## 2021-05-25 NOTE — Telephone Encounter (Signed)
States that pt has been in hospital for three days. Carbon monoxide in blood. Wanted MW aware there will be a lot of questions during upcoming appt. Please advise.

## 2021-05-25 NOTE — Telephone Encounter (Signed)
Called and spoke to patients wife Tharon Aquas (ok per DPR). She states she would rather see Dr. Melvyn Novas first and then go to one of the sleep doctors. Offered her an appt in March in [place] with [place] or [place] but patient and pts wife declined since this is too far of a drive. Also offered to schedule an appt in April for Dr. Halford Chessman or Dr. Elsworth Soho in [place] but patients wife declined this as well since they want to see Dr. Melvyn Novas first.   Routing to Dr. Melvyn Novas as an Jul[place]

## 2021-05-25 NOTE — Telephone Encounter (Signed)
aware

## 2021-05-25 NOTE — Telephone Encounter (Signed)
I agree with above rx and have nothing to add to his care until re-eval at office hopefully with all meds/ inhalers/solutions/ devices in hand

## 2021-05-25 NOTE — Telephone Encounter (Signed)
Called and spoke with [place]. She verbalized understanding.   Nothing further needed at time of call.

## 2021-05-25 NOTE — Telephone Encounter (Signed)
Called and spoke with Enid Derry from [facility]. She stated that she visits the patient weekly and has seen a gradual downfall with his health over the past few weeks. He has reported to her that sometimes when he is not wearing his O2, his O2 tends to drop into the 70s. She instructed him to wear his O2 24/7.   She wanted to know what his prognosis is with the COPD. I advised her that MW has not seen the patient since October 2022 and that he has a visit on Monday. She was not aware that the patient had been in the hospital on 05/23/21. I told her that we had received a call today from his wife and the plan for now was for him to see MW on Monday and then he will establish with one of our sleep docs afterwards.   MW, can you please advise? Thanks.

## 2021-05-28 ENCOUNTER — Other Ambulatory Visit: Payer: Self-pay

## 2021-05-31 ENCOUNTER — Other Ambulatory Visit: Payer: Self-pay

## 2021-05-31 ENCOUNTER — Encounter: Payer: Self-pay | Admitting: Internal Medicine

## 2021-05-31 ENCOUNTER — Ambulatory Visit: Payer: Medicare HMO | Admitting: Internal Medicine

## 2021-05-31 VITALS — BP 138/84 | HR 94 | Temp 98.2°F | Ht 69.0 in | Wt 260.1 lb

## 2021-05-31 DIAGNOSIS — J9612 Chronic respiratory failure with hypercapnia: Secondary | ICD-10-CM

## 2021-05-31 DIAGNOSIS — J449 Chronic obstructive pulmonary disease, unspecified: Secondary | ICD-10-CM | POA: Diagnosis not present

## 2021-05-31 DIAGNOSIS — J9611 Chronic respiratory failure with hypoxia: Secondary | ICD-10-CM | POA: Diagnosis not present

## 2021-05-31 NOTE — Progress Notes (Signed)
Thomas [name], male    DOB: 20-Jul-1955     MRN: [id number]   Brief patient profile:  4   yowm quit smoking 03/2021  can't afford maint rx   Admit date: 04/27/2020 Discharge date: 05/05/2020   Recommendations for Outpatient Follow-up:  Follow up with PCP in 1-2 weeks Follow-up BMP and CBC in 1 week Follow-up with pulmonology outpatient in 2 weeks for outpatient PFTs and set up for sleep study Continue on home oxygen as prescribed 4 L 24/7 Prednisone taper over 2 weeks as prescribed Continue now on increased dose of Lasix 40 mg twice daily Continue other home medications as prior   Equipment/Devices: Home oxygen 4 L nasal cannula     Brief/Interim Summary: TAEVYN HAUSEN is a 66 y.o. male with medical history significant for systolic CHF/ischemic cardiomyopathy, COPD, diabetes mellitus. Patient presented to the ED with complaints of increasing difficulty breathing over the past 2 months, productive cough.  Patient was initially admitted with acute hypoxemic respiratory failure that appears multifactorial in the setting of pneumonia and COPD as well as acute on chronic heart failure.  Unfortunately, he has had worsening oxygen saturations and dyspnea noted overnight on 1/11 prompting transfer to ICU and placement on BiPAP.  He has been weaned off BiPAP and is currently requiring 4 L nasal cannula oxygen. Patient was treated for COPD as well as acute on chronic diastolic congestive heart failure exacerbation and has had excellent diuresis during this admission of near 20L-this is course, took several days. He is down to a weight of 269 pounds and his usual baseline at home is around 280. His renal function has remained stable and he has completed his antibiotic course for pneumonia. . He has had PT evaluation with no home recommendations. He will require 4 L home nasal cannula oxygen which will be arranged. His home Lasix dose will be increased to 40 mg twice daily from 20 mg twice daily. His  daughter who stays with him is a nurse will ensure that he follows his appropriate diet and weighs himself daily. Pulmonology to follow-up with him outpatient to consider trilogy machine.   Discharge Diagnoses:  Principal Problem:   PNA (pneumonia) Active Problems:   Morbid obesity ([place])   Ischemic cardiomyopathy   Tobacco abuse   DM (diabetes mellitus) ([place])   Acute respiratory failure with hypoxia and hypercapnia (HCC)   Acute on chronic systolic CHF (congestive heart failure) (HCC)   Acute respiratory failure with hypoxia (HCC)   Acute on chronic respiratory failure with hypoxia and hypercapnia (HCC)   Principal discharge diagnosis: Acute on chronic hypoxemic and hypercapnic respiratory failure-multifactorial in setting of acute on chronic diastolic CHF exacerbation as well as COPD exacerbation with pneumonia.     History of Present Illness  06/22/2020  Pulmonary/ 1st office eval/ K[facility]iau Schewe / [facility]  Chief Complaint  Patient presents with   Pulmonary Consult    Referred by Dr. Manuella Ghazi. Pt states hospitalized for PNA  Jan 2022. He denies having any SOB. He states "I always have a cough, I smoke".  He was placed on supplemental o2 after recent hospitalization. He is using his albuterol inhaler 2 x per wk on average.   Dyspnea:  [employer] sev aisles and then R>L knee pain limiting  Cough: nasal congestion in am despite water in chamber Sleep: on R side bed is flat/ 3 pillows  SABA use: rarely needing  rec Plan A = Automatic = Always=    Trelegy 100  One cick each  Am - two good drags and out thru the nose  Plan B = Backup (to supplement plan A, not to replace it) Only use your albuterol inhaler as a rescue medication Please schedule a follow up office visit in 6 weeks, call sooner if needed with PFTs - bring inhalers and your drug formulary (provided by your insurance company, not me)    08/21/2020  f/u ov/[place] office/River Mckercher re: copd ? Stage/ hypercarbic ? OHS/still smoking   Chief Complaint  Patient presents with   Follow-up    No complaints currently    Dyspnea:  Limited more by knee than breathing  Cough: worse after eating, sometimes chokes  Sleeping: flat bed on side 3 pillows / says feels fine in am  SABA use: once or twice for cough  02: 02 3lpm hs and prn  Covid status: vax x 3  Lung cancer screening: last CT 12/19/2018 > referred for [facility] shared decision making Pain under L rib better p flatus x 2011 never supine  Rec I will refer you today to [name] who runs the lung cancer screening program for shared decision making > pos RLL endobronchial dz > referred to [name]  Classic subdiaphragmatic pain pattern suggests ibs:  Treatment consists of avoiding foods that cause gas (especially boiled eggs, mexcican food but especially  beans and undercooked vegetables like  spinach and some salads)  and citrucel 1 heaping tsp twice daily with a large glass of water.  Pain should improve w/in 2 weeks and if not then consider further GI work up.   Be sure you take omeprazole 40 mg Take 30-60 min before first meal of the day  Only use your albuterol as a rescue medication The key is to stop smoking completely before smoking completely stops you! 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   SOB and cough have been worse since doing biopsy and during radiation treatments   Wants to discuss getting an inogen machine.   Dyspnea:  knee still slowing him down/  hc parking/ mb and back to house with 02 2lpm pulsing sats 89%   more of incline toward the mailbox  Cough: none  Sleeping: flat bed 3 pillows  SABA use: as above  02: 2lpm hs and pulse  Covid status: vax x 3  RT thru Feb 23 2021 / no chemo  Rec Make sure you check your oxygen saturation  at your highest level of activity   Work on inhaler technique:    Ok to try albuterol 15 min before an activity (on alternating days with the neb )  that you know would usually make you short of breath  Please schedule a follow up visit in 3 months with PFTs on return > not done     05/31/2021  post f/u ov/[place] office/Governor Matos re: GOLD 0/chf on 02  maint on 03 3lpm 24/7  Chief Complaint  Patient presents with  Follow-up    Hosp from 05/23/21-05/26/2021 for CHF at [facility] to discuss portable tank    Dyspnea:  room to room / not trying mailbox yet x 50 ft falt  Cough: better  Sleeping: flat bed on side 2 pillows  SABA use: neither hfa nor neb seem to help vs just sitting down and resting but does not pre or re challenge as rec  02: 3lpm  24/7 but says he was told he needed bipap for hypercarbia Covid status: vax x 4      No obvious day to day or daytime variability or assoc excess/ purulent sputum or mucus plugs or hemoptysis or cp or chest tightness, subjective wheeze or overt sinus or hb symptoms.   Sleeping  without nocturnal  or early am exacerbation  of respiratory  c/o's or need for noct saba. Also denies any obvious fluctuation of symptoms with weather or environmental changes or other aggravating or alleviating factors except as outlined above   No unusual exposure hx or h/o childhood pna/ asthma or knowledge of premature birth.  Current Allergies, Complete Past Medical History, Past Surgical History, Family History, and Social History were reviewed in [employer] record.  ROS  The following are not active complaints unless bolded Hoarseness, sore throat, dysphagia, dental problems, itching, sneezing,  nasal  congestion or discharge of excess mucus or purulent secretions, ear ache,   fever, chills, sweats, unintended wt loss or wt gain, classically pleuritic or exertional cp,  orthopnea pnd or arm/hand swelling  or leg swelling, presyncope, palpitations, abdominal pain, anorexia, nausea, vomiting, diarrhea  or change in bowel habits or change in bladder habits, change in stools or change in urine, dysuria, hematuria,  rash, arthralgias, visual complaints, headache, numbness, weakness or ataxia or problems with walking or coordination,  change in mood or  memory.        Current Meds  Medication Sig   albuterol (PROVENTIL) (2.5 MG/3ML) 0.083% nebulizer solution Take 3 mLs (2.5 mg total) by nebulization every 6 (six) hours as needed for wheezing or shortness of breath.   albuterol (VENTOLIN HFA) 108 (90 Base) MCG/ACT inhaler Inhale 2 puffs into the lungs every 6 (six) hours as needed for wheezing or shortness of breath.   aspirin EC 81 MG tablet Take 81 mg by mouth daily.   atorvastatin (LIPITOR) 80 MG tablet TAKE ONE TABLET BY MOUTH ONCE DAILY AT 6 PM.   azithromycin (ZITHROMAX) 250 MG tablet Take 2 tablets on first day, then 1 tablet daily until finished.   canagliflozin (INVOKANA) 300 MG TABS tablet Take 300 mg by mouth daily before breakfast.   carvedilol (COREG) 12.5 MG tablet Take 1 tablet (12.5 mg total) by mouth 2 (two) times daily.   clopidogrel (PLAVIX) 75 MG tablet TAKE ONE TABLET BY MOUTH DAILY WITH BREAKFAST   furosemide (LASIX) 20 MG tablet TAKE ONE TABLET BY MOUTH TWICE DAILY   ibuprofen (ADVIL) 200 MG tablet Take 600 mg by mouth 2 (two) times daily.   ipratropium-albuterol (DUONEB) 0.5-2.5 (3) MG/3ML SOLN 3 mL as needed   isosorbide mononitrate (IMDUR) 60 MG 24 hr tablet TAKE ONE TABLET BY MOUTH EVERY DAY   ketoconazole (NIZORAL) 2 % cream 1 application to groin   meloxicam (MOBIC) 7.5 MG tablet    metFORMIN (GLUCOPHAGE-XR) 500 MG 24 hr tablet Take 1,000 mg by mouth 2 (two) times daily.    NEOMYCIN-POLYMYXIN-HYDROCORTISONE (CORTISPORIN) 1 % SOLN OTIC solution 4 drops into affected ear  nitroGLYCERIN (NITROSTAT) 0.4 MG SL tablet Place 1 tablet (0.4 mg total) under the tongue every 5 (five) minutes x 3 doses as needed for chest pain (if no relief after 2nd dose, proceed to the ED for an evaluation or call 911).   omeprazole (PRILOSEC) 40 MG capsule Take 40 mg by mouth daily.   potassium chloride (KLOR-CON) 10 MEQ tablet Take 1 tablet (10 mEq total) by mouth 2 (two) times daily.   predniSONE (DELTASONE) 10 MG tablet Take 4 tabs x 2 days, 2 tabs x 2 days, then 1 tab x 2 days and stop.   sodium chloride (OCEAN) 0.65 % nasal spray Place into the nose.   sodium chloride (OCEAN) 0.65 % SOLN nasal spray Place 1 spray into both nostrils as needed for congestion.                     Past Medical History:  Diagnosis Date   CHF (congestive heart failure) (HCC)    COPD (chronic obstructive pulmonary disease) (HCC)    Coronary artery disease    Morbid obesity ([place])    NSTEMI (non-ST elevated myocardial infarction) ([place])    a. s/p cath in 03/2018 showing 90% OM2 stenosis and nonobstructive CAD along LAD and LCx with medical management recommended   Pneumonia    Secondary cardiomyopathy ([place])    a. EF reported as 20-25% by echo in 03/2018 b. EF at 45% by repeat imaging in 10/2018   Snoring    Type 2 diabetes mellitus (HCC)       Objective:    Wts  05/31/2021       260  01/25/2021     270   08/21/20 283 lb 9.6 oz (128.6 kg)  08/18/20 284 lb (128.8 kg)  06/22/20 284 lb (128.8 kg)    Vital signs reviewed  05/31/2021  - Note at rest 02 sats  95% on 3lpm pulsed   General appearance:    chronically ill MO wm nad        HEENT : pt wearing mask not removed for exam due to covid -19 concerns.    NECK :  without JVD/Nodes/TM/ nl carotid upstrokes bilaterally   LUNGS: no acc muscle use,  Nl contour chest which is clear to A and P bilaterally without cough on insp or exp  maneuvers   CV:  RRR  no s3 or murmur or increase in P2, and trace ankle edema L > R   ABD:  quite obese but soft and nontender with nl inspiratory excursion in the supine position. No bruits or organomegaly appreciated, bowel sounds nl  MS:  Nl gait/ ext warm without deformities, calf tenderness, cyanosis or clubbing No obvious joint restrictions   SKIN: warm and dry without lesions    NEURO:  alert, approp, nl sensorium with  no motor or cerebellar deficits apparent.          Assessment

## 2021-05-31 NOTE — Patient Instructions (Addendum)
Make sure you check your oxygen saturation  AT  your highest level of activity (not after you stop)   to be sure it stays around  90% and adjust  02 flow upward to maintain this level if needed but remember to turn it back to previous settings when you stop (to conserve your supply).  Schedule best fit evaluation for portable 02 and call me the the name of the person (and their direct number if possible)  who does the test and date you do it if there is something he/she does Roney Jaffe that you don't understand   Schedule sleep medicine evaluation here next available consult slot   PFTs reordered

## 2021-06-01 ENCOUNTER — Encounter: Payer: Self-pay | Admitting: Internal Medicine

## 2021-06-01 ENCOUNTER — Other Ambulatory Visit: Payer: Self-pay

## 2021-06-01 DIAGNOSIS — J449 Chronic obstructive pulmonary disease, unspecified: Secondary | ICD-10-CM

## 2021-06-01 NOTE — Assessment & Plan Note (Addendum)
[facility]03  05/05/20  = 40  -  06/22/2020   Walked RA  approx   300 ft  @ moderate pace  stopped due to  A leg and back pain/no SOB with sats still 90% at end    -  08/21/2020   Walked RA  approx   300 ft  @ slow pace  stopped due to  Tired/ sats still 87% RA -  01/25/2021   Walked on 2lpm cont x  3  lap(s) =  approx 450 @ slow pace, stopped due to end of study  with lowest 02 sats  89% so rec for inogen 2lpm  - - [facility]03      05/26/21   = 44 > referred to sleep medicine 06/01/2021   Etiology and pathophysiology of osa including relationship to obesity reviewed in detail  Referred for best fit for portable 02 - wants POC if qualifies   In meantime advised Make sure you check your oxygen saturation  AT  your highest level of activity (not after you stop)   to be sure it stays over 90% and adjust  02 flow upward to maintain this level if needed but remember to turn it back to previous settings when you stop (to conserve your supply).     Each maintenance medication was reviewed in detail including emphasizing most importantly the difference between maintenance and prns and under what circumstances the prns are to be triggered using an action plan format where appropriate.  Total time for H and P, chart review, counseling, reviewing 02/hfa/ neb device(s) and generating customized AVS unique to this office visit / same day charting =30 min

## 2021-06-01 NOTE — Assessment & Plan Note (Addendum)
Quit smoking 03/2021  06/22/2020  After extensive coaching inhaler device,  effectiveness =    90% with elipta > try trelegy > no better as of 08/21/2020 so just rec prn saba  - - The proper method of use, as well as anticipated side effects, of a metered-dose inhaler were discussed and demonstrated to the patient using teach back method. Improved effectiveness after extensive coaching during this visit to a level of approximately 75 % from a baseline of < 50 %  So continue saba hfa and back up with neb as says can't afford maint rx   trelegy did not help and I think most of his problem is ohs, not copd, but needs to complete pfts esp now that he's quit smoking to get a baseline going forward/ advised   In meantime reviewed saba  Re SABA :  I spent extra time with pt today reviewing appropriate use of albuterol for prn use on exertion with the following points: 1) saba is for relief of sob that does not improve by walking a slower pace or resting but rather if the pt does not improve after trying this first. 2) If the pt is convinced, as many are, that saba helps recover from activity faster then it's easy to tell if this is the case by re-challenging : ie stop, take the inhaler, then p 5 minutes try the exact same activity (intensity of workload) that just caused the symptoms and see if they are substantially diminished or not after saba 3) if there is an activity that reproducibly causes the symptoms, try the saba 15 min before the activity on alternate days   If in fact the saba really does help, then fine to continue to use it prn but advised may need to look closer at the maintenance regimen being used to achieve better control of airways disease with exertion.

## 2021-06-07 ENCOUNTER — Other Ambulatory Visit: Payer: Self-pay | Admitting: Cardiology

## 2021-06-16 ENCOUNTER — Telehealth: Payer: Self-pay | Admitting: Internal Medicine

## 2021-06-16 NOTE — Telephone Encounter (Signed)
Called and spoke to patient. He states last night around midnight his O2 concentrator stopped putting out O2. He states he woke up and felt a lot of chest pressure. Has since fixed concentrator and now has oxygen but states his O2 is 91% and he still has a lot of chest pressure.  ? ?Would like to know from Dr. Halford Chessman if there is anything to help this or anything he can do.  ?

## 2021-06-16 NOTE — Telephone Encounter (Signed)
Called and went over recs with patient from Dr. Halford Chessman. He states PCP is not in office today and refused appt in [place]. Let patient know that if we  have any cancellations for tomorrow or Friday we will give him a call and if it worsens to either call us or go to ED.  ? ? ?

## 2021-06-16 NOTE — Telephone Encounter (Signed)
He needs to make an appointment with [place] or his PCP today to assess further. ?

## 2021-06-17 ENCOUNTER — Encounter: Payer: Self-pay | Admitting: Pulmonary Disease

## 2021-06-17 ENCOUNTER — Other Ambulatory Visit: Payer: Self-pay

## 2021-06-17 ENCOUNTER — Ambulatory Visit: Payer: Medicare HMO | Admitting: Pulmonary Disease

## 2021-06-17 VITALS — BP 138/82 | HR 100 | Temp 98.4°F | Ht 69.0 in | Wt 265.2 lb

## 2021-06-17 DIAGNOSIS — J432 Centrilobular emphysema: Secondary | ICD-10-CM

## 2021-06-17 DIAGNOSIS — F17210 Nicotine dependence, cigarettes, uncomplicated: Secondary | ICD-10-CM | POA: Diagnosis not present

## 2021-06-17 DIAGNOSIS — J9611 Chronic respiratory failure with hypoxia: Secondary | ICD-10-CM

## 2021-06-17 DIAGNOSIS — C3491 Malignant neoplasm of unspecified part of right bronchus or lung: Secondary | ICD-10-CM | POA: Diagnosis not present

## 2021-06-17 DIAGNOSIS — J9612 Chronic respiratory failure with hypercapnia: Secondary | ICD-10-CM

## 2021-06-17 NOTE — Patient Instructions (Signed)
Will make sure your breathing test is schedule in [place] office ? ?Follow up in 3 months with Dr. Melvyn Novas ?

## 2021-06-17 NOTE — Progress Notes (Signed)
? ?[facility] ? ?Chief Complaint  ?Patient presents with  ? Follow-up  ?  Chest pressure after O2 concentrator stopped working. Feels chest pressure has been relieved.   ? ? ?Past Surgical History:  ?He  has a past surgical history that includes LEFT HEART CATH AND CORONARY ANGIOGRAPHY (N/A, 03/30/2018); Cholecystectomy; Tonsillectomy; Shoulder arthroscopy (Bilateral); Wisdom tooth extraction; Colonoscopy; Upper gi endoscopy; Video bronchoscopy (Right, 10/30/2020); Bronchial biopsy (10/30/2020); Bronchial washings (10/30/2020); Bronchial brushings (10/30/2020); and Endobronchial ultrasound (10/30/2020). ? ?Past Medical History:  ?OA, CHF, CAD, Fatty liver, GERD, Neuropathy, Pneumonia, DM type 2, NSCLC ? ?Constitutional:  ?BP 138/82 (BP Location: Left Arm, Patient Position: Sitting)   Pulse 100   Temp 98.4 ?F (36.9 ?C)   Ht 5\' 9"  (1.753 m)   Wt 265 lb 3.2 oz (120.3 kg)   SpO2 92% Comment: 2LO2 cont  BMI 39.16 kg/m?  ? ?Brief Summary:  ?Thomas [name] is a 66 y.o. male smoker with snoring and COPD.  ?  ? ? ? ?Subjective:  ? ?He is here with his wife. ? ?He is followed by Dr. Melvyn Novas. ? ?His oxygen power cord got disconnected during the night a couple of nights ago.  He woke up feeling anxious and heavy in his chest.  His SpO2 was 79%.  He reconnected his power cord and oxygen level improved.  He felt heavy in his chest until yesterday.   ? ?He gets anxious and feels heavy in his chest if he doesn't smoke cigarettes.  This feeling goes away within a few minutes after he smokes.  He is planning to get nicotine lozenges. ? ?He sleeps okay.  Wife says he isn't snoring much.  He doesn't think he has any issues with his breathing while asleep.  Epworth score is 10 out of 24. ? ?He has been feeling depressed and anxious since he was diagnosed with lung cancer. ? ?Physical Exam:  ? ?Appearance - well kempt, wearing oxygen ? ?ENMT - no sinus tenderness, no oral exudate, no LAN,  Mallampati 3 airway, no stridor, poor dentition ? ?Respiratory - decreased BS Rt base, no wheeze ? ?CV - s1s2 regular rate and rhythm, no murmurs ? ?Ext - no clubbing, no edema ? ?Skin - no rashes ? ?Psych - normal mood and affect ?  ?Pulmonary testing:  ?Lt thoracentesis 12/08/20 >> 1.2 liters yellow fluid, reactive mesothelial cells ?ABG 05/24/21 >> pH 7.49, PCO2 56 ,PO2 59 ? ?Chest Imaging:  ?LDCT chest 10/08/20 >> atherosclerosis, mod Rt effusion, centrilobular emphysema, occlusion of bronchus intermedius, scattered nodules ?CT chest 06/10/21 >> 1.1 cm endobronchial lesion in bronchus intermedius, RML/RLL collapse, mod/large Rt pleural effusion ? ?Sleep Tests:  ? ? ?Cardiac Tests:  ?Echo 04/28/20 >> EF 55 to 60% ? ?Social History:  ?He  reports that he has been smoking cigarettes. He started smoking about 56 years ago. He has a 27.50 pack-year smoking history. He has never used smokeless tobacco. He reports that he does not currently use alcohol. He reports current drug use. Drug: Marijuana. ? ?Family History:  ?His family history includes Emphysema in his father; Heart failure in his father. ?  ? ? ?Assessment/Plan:  ? ?COPD with emphysema. ?- will have his PFT rescheduled to [facility] office to expedite set up ?- continue prn albuterol ?- he will follow up with Dr. Melvyn Novas ? ?Chronic hypoxic, hypercapnic respiratory failure. ?- continue 2 liters oxygen 24/7 ?- he has appointment with [employer] to assess for a different POC ?-  discussed how he could have hypoventilation related to sleep disordered breathing; he doesn't feel like his sleep is an issue and would like to defer additional sleep testing at this time ? ?Tobacco abuse. ?- he will try nicotine lozenges ? ?Depression, anxiety. ?- advised him to d/w his PCP about whether he would benefit from referral to behavioral health ? ?Non small cell lung cancer. 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 Delton Coombes oncology and Dr. Lynnette Caffey with  radiation oncology ? ?Coronary artery disease, history of ischemic cardiomyopathy. ?- followed by Dr. Domenic Polite with cardiology ?- advised if chest pain recurs he should contact cardiology or his PCP, or go to the hospital ? ?Time Spent Involved in Patient Care on Day of Examination:  ?46 minutes ? ?Follow up:  ? ?Patient Instructions  ?Will make sure your breathing test is schedule in [place] office ? ?Follow up in 3 months with Dr. Melvyn Novas ? ?Medication List:  ? ?Allergies as of 06/17/2021   ? ?   Reactions  ? Glipizide Palpitations  ? Codeine Nausea And Vomiting  ? Doxycycline Other (See Comments)  ? GI-Upset  ? ?  ? ?  ?Medication List  ?  ? ?  ? Accurate as of June 17, 2021 10:09 AM. If you have any questions, ask your nurse or doctor.  ?  ?  ? ?  ? ?STOP taking these medications   ? ?azithromycin 250 MG tablet ?Commonly known as: Zithromax ?Stopped by: Chesley Mires, MD ?  ?ipratropium-albuterol 0.5-2.5 (3) MG/3ML Soln ?Commonly known as: DUONEB ?Stopped by: Chesley Mires, MD ?  ?meloxicam 7.5 MG tablet ?Commonly known as: MOBIC ?Stopped by: Chesley Mires, MD ?  ? ?  ? ?TAKE these medications   ? ?albuterol 108 (90 Base) MCG/ACT inhaler ?Commonly known as: VENTOLIN HFA ?Inhale 2 puffs into the lungs every 6 (six) hours as needed for wheezing or shortness of breath. ?  ?albuterol (2.5 MG/3ML) 0.083% nebulizer solution ?Commonly known as: PROVENTIL ?Take 3 mLs (2.5 mg total) by nebulization every 6 (six) hours as needed for wheezing or shortness of breath. ?  ?aspirin EC 81 MG tablet ?Take 81 mg by mouth daily. ?  ?atorvastatin 80 MG tablet ?Commonly known as: LIPITOR ?TAKE ONE TABLET BY MOUTH ONCE DAILY AT 6 PM. ?  ?canagliflozin 300 MG Tabs tablet ?Commonly known as: INVOKANA ?Take 300 mg by mouth daily before breakfast. ?  ?carvedilol 12.5 MG tablet ?Commonly known as: COREG ?Take 1 tablet (12.5 mg total) by mouth 2 (two) times daily. ?  ?clopidogrel 75 MG tablet ?Commonly known as: PLAVIX ?TAKE ONE TABLET BY MOUTH  DAILY WITH BREAKFAST ?  ?furosemide 20 MG tablet ?Commonly known as: LASIX ?TAKE ONE TABLET BY MOUTH TWICE DAILY ?  ?ibuprofen 200 MG tablet ?Commonly known as: ADVIL ?Take 600 mg by mouth 2 (two) times daily. ?  ?isosorbide mononitrate 60 MG 24 hr tablet ?Commonly known as: IMDUR ?TAKE ONE TABLET BY MOUTH EVERY DAY ?  ?ketoconazole 2 % cream ?Commonly known as: NIZORAL ?1 application to groin ?  ?metFORMIN 500 MG 24 hr tablet ?Commonly known as: GLUCOPHAGE-XR ?Take 1,000 mg by mouth 2 (two) times daily. ?  ?NEOMYCIN-POLYMYXIN-HYDROCORTISONE 1 % Soln OTIC solution ?Commonly known as: CORTISPORIN ?4 drops into affected ear ?  ?nitroGLYCERIN 0.4 MG SL tablet ?Commonly known as: NITROSTAT ?DISSOLVE ONE TABLET UNDER TONGUE EVERY 5 MINUTES UP TO 3 DOSES AS NEEDED FOR CHEST PAIN ?  ?omeprazole 40 MG capsule ?Commonly known  as: PRILOSEC ?Take 40 mg by mouth daily. ?  ?potassium chloride 10 MEQ tablet ?Commonly known as: KLOR-CON ?Take 1 tablet (10 mEq total) by mouth 2 (two) times daily. ?  ?sodium chloride 0.65 % Soln nasal spray ?Commonly known as: OCEAN ?Place 1 spray into both nostrils as needed for congestion. ?  ?sodium chloride 0.65 % nasal spray ?Commonly known as: OCEAN ?Place into the nose. ?  ?ZOLOFT PO ?Take by mouth. ?  ? ?  ? ? ?Signature:  ?Chesley Mires, MD ?[place] ?Pager - [phone] ?06/17/2021, 10:09 AM ?  ? ? ? ? ? ? ? ? ?

## 2021-06-18 ENCOUNTER — Emergency Department (HOSPITAL_COMMUNITY)
Admission: EM | Admit: 2021-06-18 | Discharge: 2021-06-18 | Disposition: A | Discharge status: Home / Self Care | Payer: Medicare HMO | Attending: Emergency Medicine | Admitting: Emergency Medicine

## 2021-06-18 ENCOUNTER — Emergency Department (HOSPITAL_COMMUNITY): Payer: Medicare HMO

## 2021-06-18 ENCOUNTER — Other Ambulatory Visit: Payer: Self-pay

## 2021-06-18 ENCOUNTER — Telehealth: Payer: Self-pay | Admitting: Pulmonary Disease

## 2021-06-18 DIAGNOSIS — J918 Pleural effusion in other conditions classified elsewhere: Secondary | ICD-10-CM | POA: Diagnosis not present

## 2021-06-18 DIAGNOSIS — Z9981 Dependence on supplemental oxygen: Secondary | ICD-10-CM | POA: Diagnosis not present

## 2021-06-18 DIAGNOSIS — I2510 Atherosclerotic heart disease of native coronary artery without angina pectoris: Secondary | ICD-10-CM | POA: Diagnosis not present

## 2021-06-18 DIAGNOSIS — C3490 Malignant neoplasm of unspecified part of unspecified bronchus or lung: Secondary | ICD-10-CM | POA: Insufficient documentation

## 2021-06-18 DIAGNOSIS — R0789 Other chest pain: Secondary | ICD-10-CM | POA: Diagnosis present

## 2021-06-18 DIAGNOSIS — I509 Heart failure, unspecified: Secondary | ICD-10-CM | POA: Insufficient documentation

## 2021-06-18 DIAGNOSIS — Z9889 Other specified postprocedural states: Secondary | ICD-10-CM

## 2021-06-18 DIAGNOSIS — J90 Pleural effusion, not elsewhere classified: Secondary | ICD-10-CM

## 2021-06-18 LAB — BASIC METABOLIC PANEL
Anion gap: 9 (ref 5–15)
BUN: 18 mg/dL (ref 8–23)
CO2: 32 mmol/L (ref 22–32)
Calcium: 8.9 mg/dL (ref 8.9–10.3)
Chloride: 97 mmol/L — ABNORMAL LOW (ref 98–111)
Creatinine, Ser: 0.48 mg/dL — ABNORMAL LOW (ref 0.61–1.24)
GFR, Estimated: >60 mL/min (ref >60)
Glucose, Bld: 155 mg/dL — ABNORMAL HIGH (ref 70–99)
Potassium: 3.8 mmol/L (ref 3.5–5.1)
Sodium: 138 mmol/L (ref 135–145)

## 2021-06-18 LAB — PROTIME-INR
INR: 0.9 (ref 0.8–1.2)
Prothrombin Time: 12.6 seconds (ref 11.4–15.2)

## 2021-06-18 LAB — CBC
HCT: 45.5 % (ref 39.0–52.0)
Hemoglobin: 14 g/dL (ref 13.0–17.0)
MCH: 29 pg (ref 26.0–34.0)
MCHC: 30.8 g/dL (ref 30.0–36.0)
MCV: 94.4 fL (ref 80.0–100.0)
Platelets: 154 10*3/uL (ref 150–400)
RBC: 4.82 MIL/uL (ref 4.22–5.81)
RDW: 13.1 % (ref 11.5–15.5)
WBC: 4.5 10*3/uL (ref 4.0–10.5)
nRBC: 0 % (ref 0.0–0.2)

## 2021-06-18 LAB — TROPONIN I (HIGH SENSITIVITY)
Troponin I (High Sensitivity): 6 ng/L (ref <18)
Troponin I (High Sensitivity): 5 ng/L (ref <18)

## 2021-06-18 LAB — BRAIN NATRIURETIC PEPTIDE: B Natriuretic Peptide: 16 pg/mL (ref 0.0–100.0)

## 2021-06-18 MED ORDER — LIDOCAINE HCL (PF) 2 % IJ SOLN
INTRAMUSCULAR | Status: AC
  Filled 2021-06-18: qty 10

## 2021-06-18 NOTE — Procedures (Signed)
PreOperative Dx: RT pleural effusion ?Postoperative Dx: RT pleural effusion ?Procedure:   US guided RT thoracentesis ?Radiologist:  Thornton Papas ?Anesthesia:  17 ml of 2% lidocaine ?Specimen:  1.1 L of yellow colored fluid ?EBL:   < 1 ml ?Complications: None   ?

## 2021-06-18 NOTE — ED Provider Notes (Signed)
[facility] EMERGENCY DEPARTMENT Provider Note   CSN: [id number] Arrival date & time: 06/18/21  1435     History  Chief Complaint  Patient presents with   Chest Pain    ABDURAHMAN [name] is a 66 y.o. male.  HPI Patient here for evaluation of brief episode of chest pain that occurred earlier this morning and radiated to his left arm.  He can only describe it as "pain."  While he is having the pain he took nitroglycerin and short time later the pain went away.  He has used nitroglycerin 2 or 3 times in the last couple months.  He states also this morning he had to increase his oxygen to 3 L from 2 L, because he was short of breath.  This helped.  Additionally he has been sleeping sitting up in a chair for the last several days, because he gets more dyspneic when supine.  He last saw his cardiologist in November 2023 and was stable.  He has history of coronary disease and has been diagnosed with heart failure.  He also has lung cancer.  He saw his pulmonologist yesterday, to be evaluated for.  Chest pain that was coincident with failure of his oxygen concentrating device.  Patient was stable from that perspective and told to follow-up routinely.  He also saw his radiation oncologist earlier this week and was told that he had a pleural effusion on the right side that was worse than prior, but that he still did not have evidence of lung cancer.  Plans then were to follow-up with CT scan in May 2023.    Home Medications Prior to Admission medications   Medication Sig Start Date End Date Taking? Authorizing Provider  albuterol (PROVENTIL) (2.5 MG/3ML) 0.083% nebulizer solution Take 3 mLs (2.5 mg total) by nebulization every 6 (six) hours as needed for wheezing or shortness of breath. 10/15/20  Yes Thomas [name]  albuterol (VENTOLIN HFA) 108 (90 Base) MCG/ACT inhaler Inhale 2 puffs into the lungs every 6 (six) hours as needed for wheezing or shortness of breath.   Yes Thomas [name]   aspirin EC 81 MG tablet Take 81 mg by mouth daily.   Yes Thomas [name]  atorvastatin (LIPITOR) 80 MG tablet TAKE ONE TABLET BY MOUTH ONCE DAILY AT 6 PM. Patient taking differently: Take 80 mg by mouth daily. 04/01/21  Yes Thomas [name]  canagliflozin (INVOKANA) 300 MG TABS tablet Take 300 mg by mouth daily before breakfast.   Yes Thomas [name]  clopidogrel (PLAVIX) 75 MG tablet TAKE ONE TABLET BY MOUTH DAILY WITH BREAKFAST 04/01/21  Yes Thomas [name]  docusate sodium (COLACE) 100 MG capsule Take 100 mg by mouth 2 (two) times daily.   Yes Thomas [name]  furosemide (LASIX) 20 MG tablet TAKE ONE TABLET BY MOUTH TWICE DAILY 04/01/21  Yes Thomas [name]  ibuprofen (ADVIL) 200 MG tablet Take 600 mg by mouth 2 (two) times daily.   Yes Thomas [name]  isosorbide mononitrate (IMDUR) 60 MG 24 hr tablet TAKE ONE TABLET BY MOUTH EVERY DAY 04/01/21  Yes Thomas [name]  ketoconazole (NIZORAL) 2 % cream Apply 1 application topically daily as needed for irritation. 05/15/20  Yes Thomas [name]  metFORMIN (GLUCOPHAGE-XR) 500 MG 24 hr tablet Take 1,000 mg by mouth 2 (two) times daily. 11/25/18  Yes Thomas [name]  nitroGLYCERIN (NITROSTAT) 0.4 MG SL tablet DISSOLVE ONE TABLET UNDER TONGUE EVERY 5  MINUTES UP TO 3 DOSES AS NEEDED FOR CHEST PAIN 06/07/21  Yes Thomas [name]  omeprazole (PRILOSEC) 40 MG capsule Take 40 mg by mouth daily.   Yes Thomas [name]  potassium chloride (KLOR-CON M) 10 MEQ tablet Take 1 tablet by mouth 2 (two) times daily.   Yes Thomas [name]  sertraline (ZOLOFT) 25 MG tablet Take 1 tablet by mouth daily. 06/07/21  Yes Thomas [name]  sodium chloride (OCEAN) 0.65 % nasal spray Place into the nose.   Yes Thomas [name]  carvedilol (COREG) 12.5 MG tablet Take 1 tablet (12.5 mg total) by mouth 2 (two) times daily. 11/16/20   Thomas [name]   NEOMYCIN-POLYMYXIN-HYDROCORTISONE (CORTISPORIN) 1 % SOLN OTIC solution 4 drops into affected ear 10/09/20   Thomas [name]  potassium chloride (KLOR-CON) 10 MEQ tablet Take 1 tablet (10 mEq total) by mouth 2 (two) times daily. 05/05/20   Thomas [name]      Allergies    Glipizide, Codeine, and Doxycycline    Review of Systems   Review of Systems  Physical Exam Updated Vital Signs BP (!) 149/66    Pulse (!) 110    Temp 98.9 F (37.2 C)    Resp (!) 30    Ht 5\' 9"  (1.753 m)    Wt 121 kg    SpO2 95%    BMI 39.39 kg/m  Physical Exam Vitals and nursing note reviewed.  Constitutional:      General: He is not in acute distress.    Appearance: He is well-developed. He is not ill-appearing or diaphoretic.  HENT:     Head: Normocephalic and atraumatic.     Right Ear: External ear normal.     Left Ear: External ear normal.  Eyes:     Conjunctiva/sclera: Conjunctivae normal.     Pupils: Pupils are equal, round, and reactive to light.  Neck:     Trachea: Phonation normal.  Cardiovascular:     Rate and Rhythm: Normal rate and regular rhythm.     Heart sounds: Normal heart sounds.  Pulmonary:     Effort: Pulmonary effort is normal. No respiratory distress.     Breath sounds: Normal breath sounds. No stridor.  Abdominal:     General: There is no distension.     Palpations: Abdomen is soft.     Tenderness: There is no abdominal tenderness.  Musculoskeletal:        General: Normal range of motion.     Cervical back: Normal range of motion and neck supple.     Right lower leg: Edema present.     Left lower leg: Edema present.  Skin:    General: Skin is warm and dry.  Neurological:     Mental Status: He is alert and oriented to person, place, and time.     Cranial Nerves: No cranial nerve deficit.     Sensory: No sensory deficit.     Motor: No abnormal muscle tone.     Coordination: Coordination normal.  Psychiatric:        Mood and Affect: Mood normal.        Behavior:  Behavior normal.        Thought Content: Thought content normal.        Judgment: Judgment normal.    ED Results / Procedures / Treatments   Labs (all labs ordered are listed, but only abnormal results are displayed) Labs Reviewed  BASIC METABOLIC PANEL - Abnormal;  Notable for the following components:      Result Value   Chloride 97 (*)    Glucose, Bld 155 (*)    Creatinine, Ser 0.48 (*)    All other components within normal limits  CBC  BRAIN NATRIURETIC PEPTIDE  PROTIME-INR  CYTOLOGY - NON PAP  TROPONIN I (HIGH SENSITIVITY)  TROPONIN I (HIGH SENSITIVITY)    EKG EKG Interpretation  Date/Time:  Friday June 18 2021 15:07:01 EST Ventricular Rate:  93 PR Interval:  198 QRS Duration: 102 QT Interval:  356 QTC Calculation: 442 R Axis:   158 Text Interpretation: Normal sinus rhythm Right axis deviation Possible Anterior infarct (cited on or before 20-Dec-2018) Abnormal ECG When compared with ECG of 28-Apr-2020 00:44, No significant change was found Confirmed by Daleen Bo [phone]) on 06/18/2021 3:13:55 PM  Radiology DG Chest Port 1 View  Result Date: 06/18/2021 CLINICAL DATA:  RIGHT pleural effusion post thoracentesis EXAM: PORTABLE CHEST 1 VIEW COMPARISON:  Portable exam 1743 hours compared to 1517 hours FINDINGS: Expiratory technique. Enlargement of cardiac silhouette with persistent basilar effusion and RIGHT basilar atelectasis accentuated by expiratory technique. No pneumothorax following thoracentesis. LEFT lung remains clear. IMPRESSION: No pneumothorax following thoracentesis. Electronically Signed   By: Lavonia Dana M.D.   On: 06/18/2021 17:52   DG Chest Portable 1 View  Result Date: 06/18/2021 CLINICAL DATA:  Chest pain EXAM: PORTABLE CHEST 1 VIEW COMPARISON:  Previous studies including the examination of 12/08/2020 FINDINGS: Transverse diameter of heart is increased. There is improvement in aeration of left lung. There is moderate right pleural effusion obscuring the  right mid and right lower lung fields. There is no pneumothorax. IMPRESSION: Moderate right pleural effusion. Evaluation of right mid and right lower lung fields is limited by the effusion. There are no signs of alveolar pulmonary edema. Left lung is clear. Electronically Signed   By: Elmer Picker M.D.   On: 06/18/2021 15:24   US THORACENTESIS ASP PLEURAL SPACE W/IMG GUIDE  Result Date: 06/18/2021 Lavonia Dana, [name]     06/18/2021  5:39 PM PreOperative Dx: RT pleural effusion Postoperative Dx: RT pleural effusion Procedure:   US guided RT thoracentesis Radiologist:  Thornton Papas Anesthesia:  17 ml of 2% lidocaine Specimen:  1.1 L of yellow colored fluid EBL:   < 1 ml Complications: None     Procedures Procedures    Medications Ordered in ED Medications  lidocaine HCl (PF) (XYLOCAINE) 2 % injection (  Given by Other 06/18/21 1723)    ED Course/ Medical Decision Making/ A&P Clinical Course as of 06/19/21 1010  Fri Jun 18, 2021  1947 Thoracentesis completed by interventional radiologist.  He was able to drain 1.1 L of fluid.  Patient is comfortable and states he wants to go home.  Repeat chest x-ray shows improved effusion, no pneumothorax.  Vital signs have remained stable for greater than 1 hour postprocedure. [EW]    Clinical Course User Index [EW] Daleen Bo, [name]                           Medical Decision Making Patient presenting for evaluation of shortness of breath and decreased oxygen requirement, from prior.  Underlying chronic illnesses including lung cancer, heart failure and chronic respiratory distress.  Problems Addressed: Pleural effusion: acute illness or injury    Details: Worsening chronic effusion  Amount and/or Complexity of Data Reviewed Independent Historian:     Details: He is a cogent historian External Data  Reviewed: labs, radiology and notes.    Details: Recent evaluation once by pulmonology, with medication management; radiation oncology regarding treated lung  cancer.  Ongoing pleural effusion etiology not completely differentiated. Labs: ordered.    Details: CBC, metabolic panel, BNP, troponin-normal except chloride low, glucose high, creatinine low Radiology: ordered and independent interpretation performed.    Details: Chest x-ray, CT chest-free-flowing right-sided pleural effusion, larger than prior films. ECG/medicine tests: ordered and independent interpretation performed.    Details: Cardiac monitor-normal sinus rhythm Discussion of management or test interpretation with external provider(s): Consultation interventional radiologist, Dr. Darcella Cheshire; he came to the ED and did a bedside thoracentesis removing 1.1 L of fluid from the right sided pleural cavity.  Risk Decision regarding hospitalization. Risk Details: Patient presenting with shortness of breath and increased oxygen requirement and enlarging effusion.  History of lung cancer, most likely cause of recurrent pleural effusion.  Symptomatic pleural effusion required drainage urgently.  This was done in the ED by interventional radiologist.  Patient had symptomatic improvement and was comfortable after thoracentesis.  No complications noted.  Patient instructed to follow-up with pulmonology and/or oncology.  Pending labs at discharge, cytology from pleural fluid.  Critical Care Total time providing critical care: 30-74 minutes          Final Clinical Impression(s) / ED Diagnoses Final diagnoses:  Pleural effusion  S/P thoracentesis    Rx / DC Orders ED Discharge Orders     None         Daleen Bo, [name] 06/19/21 1013

## 2021-06-18 NOTE — ED Triage Notes (Signed)
Chest Pain that radiated to left arm relieved by nitro x 1. Denies nausea, diaphoresis, or increased shortness of breath. Pt is on Oxygen at 2lnc at home. Sees Dr. Domenic Polite for cardiology. ?

## 2021-06-18 NOTE — Telephone Encounter (Signed)
ATC patient. LMTCB.  ? ?Patient will need to have a HST done In order to determine if he needs a CPAP or BIPAP. The HST was discussed with Dr. Halford Chessman yesterday at [facility]. If patient would like to have a HST I can send a message and ask Dr. Halford Chessman if it is okay for [place] to place order for patient for sleep study.  ?

## 2021-06-18 NOTE — Telephone Encounter (Signed)
**   Correction- according to Dr. Morrison Old note he has been in [place] before- will need to clarify if patient is currently using bipap or not?  ?

## 2021-06-18 NOTE — Discharge Instructions (Signed)
We were able to get some fluid off your chest which will likely help your breathing.  Call your oncologist on Monday morning for a follow-up appointment to be seen as soon as possible.  You could also call your pulmonologist for evaluation and treatment of this problem.  The effusion will likely recur and need additional treatment.  Continue to use your oxygen and other medications as usual ?

## 2021-06-21 ENCOUNTER — Other Ambulatory Visit: Payer: Self-pay

## 2021-06-21 ENCOUNTER — Ambulatory Visit: Payer: Medicare HMO | Admitting: Internal Medicine

## 2021-06-21 DIAGNOSIS — J449 Chronic obstructive pulmonary disease, unspecified: Secondary | ICD-10-CM

## 2021-06-21 LAB — PULMONARY FUNCTION TEST
DL/VA % pred: 83 %
DL/VA: 3.49 ml/min/mmHg/L
DLCO cor % pred: 37 %
DLCO cor: 9.74 ml/min/mmHg
DLCO unc % pred: 36 %
DLCO unc: 9.57 ml/min/mmHg
FEF 25-75 Post: 0.74 L/sec
FEF 25-75 Pre: 0.59 L/sec
FEF2575-%Change-Post: 25 %
FEF2575-%Pred-Post: 27 %
FEF2575-%Pred-Pre: 21 %
FEV1-%Change-Post: 12 %
FEV1-%Pred-Post: 27 %
FEV1-%Pred-Pre: 24 %
FEV1-Post: 0.93 L
FEV1-Pre: 0.83 L
FEV1FVC-%Change-Post: 3 %
FEV1FVC-%Pred-Pre: 74 %
FEV6-%Change-Post: 10 %
FEV6-%Pred-Post: 38 %
FEV6-%Pred-Pre: 34 %
FEV6-Post: 1.61 L
FEV6-Pre: 1.46 L
FEV6FVC-%Pred-Post: 105 %
FEV6FVC-%Pred-Pre: 105 %
FVC-%Change-Post: 9 %
FVC-%Pred-Post: 36 %
FVC-%Pred-Pre: 33 %
FVC-Post: 1.61 L
FVC-Pre: 1.48 L
Post FEV1/FVC ratio: 58 %
Post FEV6/FVC ratio: 100 %
Pre FEV1/FVC ratio: 56 %
Pre FEV6/FVC Ratio: 100 %
RV % pred: 154 %
RV: 3.5 L
TLC % pred: 69 %
TLC: 4.74 L

## 2021-06-21 NOTE — Progress Notes (Signed)
Full PFT completed today ? ?

## 2021-06-22 ENCOUNTER — Emergency Department (HOSPITAL_COMMUNITY)
Admission: EM | Admit: 2021-06-22 | Discharge: 2021-06-22 | Disposition: A | Discharge status: Home / Self Care | Payer: Medicare HMO | Attending: Emergency Medicine | Admitting: Emergency Medicine

## 2021-06-22 ENCOUNTER — Emergency Department (HOSPITAL_COMMUNITY): Payer: Medicare HMO

## 2021-06-22 ENCOUNTER — Other Ambulatory Visit: Payer: Self-pay

## 2021-06-22 ENCOUNTER — Encounter (HOSPITAL_COMMUNITY): Payer: Self-pay | Admitting: Emergency Medicine

## 2021-06-22 DIAGNOSIS — Z7982 Long term (current) use of aspirin: Secondary | ICD-10-CM | POA: Insufficient documentation

## 2021-06-22 DIAGNOSIS — K769 Liver disease, unspecified: Secondary | ICD-10-CM | POA: Insufficient documentation

## 2021-06-22 DIAGNOSIS — Z79899 Other long term (current) drug therapy: Secondary | ICD-10-CM | POA: Diagnosis not present

## 2021-06-22 DIAGNOSIS — Z85118 Personal history of other malignant neoplasm of bronchus and lung: Secondary | ICD-10-CM | POA: Insufficient documentation

## 2021-06-22 DIAGNOSIS — Z7902 Long term (current) use of antithrombotics/antiplatelets: Secondary | ICD-10-CM | POA: Diagnosis not present

## 2021-06-22 DIAGNOSIS — Z7984 Long term (current) use of oral hypoglycemic drugs: Secondary | ICD-10-CM | POA: Insufficient documentation

## 2021-06-22 DIAGNOSIS — R079 Chest pain, unspecified: Secondary | ICD-10-CM | POA: Insufficient documentation

## 2021-06-22 DIAGNOSIS — J90 Pleural effusion, not elsewhere classified: Secondary | ICD-10-CM | POA: Insufficient documentation

## 2021-06-22 DIAGNOSIS — Z20822 Contact with and (suspected) exposure to covid-19: Secondary | ICD-10-CM | POA: Diagnosis not present

## 2021-06-22 DIAGNOSIS — Z9889 Other specified postprocedural states: Secondary | ICD-10-CM

## 2021-06-22 DIAGNOSIS — R0602 Shortness of breath: Secondary | ICD-10-CM | POA: Diagnosis present

## 2021-06-22 LAB — CBC WITH DIFFERENTIAL/PLATELET
Abs Immature Granulocytes: 0.04 10*3/uL (ref 0.00–0.07)
Basophils Absolute: 0 10*3/uL (ref 0.0–0.1)
Basophils Relative: 1 %
Eosinophils Absolute: 0.1 10*3/uL (ref 0.0–0.5)
Eosinophils Relative: 2 %
HCT: 42.8 % (ref 39.0–52.0)
Hemoglobin: 13.6 g/dL (ref 13.0–17.0)
Immature Granulocytes: 1 %
Lymphocytes Relative: 7 %
Lymphs Abs: 0.4 10*3/uL — ABNORMAL LOW (ref 0.7–4.0)
MCH: 31 pg (ref 26.0–34.0)
MCHC: 31.8 g/dL (ref 30.0–36.0)
MCV: 97.5 fL (ref 80.0–100.0)
Monocytes Absolute: 0.4 10*3/uL (ref 0.1–1.0)
Monocytes Relative: 8 %
Neutro Abs: 4 10*3/uL (ref 1.7–7.7)
Neutrophils Relative %: 81 %
Platelets: 195 10*3/uL (ref 150–400)
RBC: 4.39 MIL/uL (ref 4.22–5.81)
RDW: 13.3 % (ref 11.5–15.5)
WBC: 4.9 10*3/uL (ref 4.0–10.5)
nRBC: 0 % (ref 0.0–0.2)

## 2021-06-22 LAB — HEPATIC FUNCTION PANEL
ALT: 13 U/L (ref 0–44)
AST: 13 U/L — ABNORMAL LOW (ref 15–41)
Albumin: 3.3 g/dL — ABNORMAL LOW (ref 3.5–5.0)
Alkaline Phosphatase: 69 U/L (ref 38–126)
Bilirubin, Direct: 0.2 mg/dL (ref 0.0–0.2)
Indirect Bilirubin: 0.9 mg/dL (ref 0.3–0.9)
Total Bilirubin: 1.1 mg/dL (ref 0.3–1.2)
Total Protein: 6.5 g/dL (ref 6.5–8.1)

## 2021-06-22 LAB — BASIC METABOLIC PANEL
Anion gap: 11 (ref 5–15)
BUN: 17 mg/dL (ref 8–23)
CO2: 33 mmol/L — ABNORMAL HIGH (ref 22–32)
Calcium: 8.6 mg/dL — ABNORMAL LOW (ref 8.9–10.3)
Chloride: 97 mmol/L — ABNORMAL LOW (ref 98–111)
Creatinine, Ser: 0.54 mg/dL — ABNORMAL LOW (ref 0.61–1.24)
GFR, Estimated: >60 mL/min (ref >60)
Glucose, Bld: 149 mg/dL — ABNORMAL HIGH (ref 70–99)
Potassium: 3.9 mmol/L (ref 3.5–5.1)
Sodium: 141 mmol/L (ref 135–145)

## 2021-06-22 LAB — RESP PANEL BY RT-PCR (FLU A&B, COVID) ARPGX2
Influenza A by PCR: NEGATIVE
Influenza B by PCR: NEGATIVE
SARS Coronavirus 2 by RT PCR: NEGATIVE

## 2021-06-22 LAB — TROPONIN I (HIGH SENSITIVITY)
Troponin I (High Sensitivity): 6 ng/L (ref <18)
Troponin I (High Sensitivity): 6 ng/L (ref <18)

## 2021-06-22 LAB — CBG MONITORING, ED: Glucose-Capillary: 146 mg/dL — ABNORMAL HIGH (ref 70–99)

## 2021-06-22 LAB — BRAIN NATRIURETIC PEPTIDE: B Natriuretic Peptide: 18 pg/mL (ref 0.0–100.0)

## 2021-06-22 MED ORDER — SODIUM CHLORIDE 0.9 % IV BOLUS
250.0000 mL | Freq: Once | INTRAVENOUS | Status: AC
Start: 2021-06-22 — End: 2021-06-22
  Administered 2021-06-22: 250 mL via INTRAVENOUS

## 2021-06-22 MED ORDER — LIDOCAINE HCL (PF) 2 % IJ SOLN
INTRAMUSCULAR | Status: AC
  Administered 2021-06-22: 10 mL
  Filled 2021-06-22: qty 10

## 2021-06-22 NOTE — Procedures (Signed)
PreOperative Dx: Recurrent right pleural effusion pleural effusion ?Postoperative Dx: Recurrent right pleural effusion pleural effusion ?Procedure:   US guided RIGHT thoracentesis ?Radiologist:  Thornton Papas ?Anesthesia:  10 ml of 1% lidocaine ?Specimen:  500 mL of dark old bloody colored fluid ?EBL:   < 1 ml ?Complications:  None   ?

## 2021-06-22 NOTE — Discharge Instructions (Signed)
Follow-up with Dr. Delton Coombes next week.  Return as needed ?

## 2021-06-22 NOTE — ED Notes (Signed)
Orange juice ?

## 2021-06-22 NOTE — ED Notes (Signed)
Pt something to drink ok per DR ?

## 2021-06-22 NOTE — ED Triage Notes (Signed)
Pt states he woke with chest pain this am and took 1 nitro and fell back asleep. Pt states when he woke back up pain was more dull than before.  ?

## 2021-06-22 NOTE — ED Notes (Signed)
Pt BP has been low the last two cycles. The last BP was 97/56. MD made aware and a bolus of 0.9% NaCl 250 mL ordered.  ?

## 2021-06-22 NOTE — ED Triage Notes (Signed)
Pt also c/o blurred vision and sweaty hands.  ?

## 2021-06-22 NOTE — Progress Notes (Signed)
Right sided thoracentesis procedure completed at this time with post chest xray with 500 mL of blood fluid removed. Relaxation techniques offered to patient due to fear of needles and procedure tolerated better than previous thoracentesis. PT verbalized understanding of post procedure instructions and transported back to ED bed assignment via stretcher at this time with no acute distress noted.  ?

## 2021-06-23 LAB — CYTOLOGY - NON PAP

## 2021-06-23 NOTE — ED Provider Notes (Signed)
[facility] EMERGENCY DEPARTMENT Provider Note   CSN: [id number] Arrival date & time: 06/22/21  1025     History  Chief Complaint  Patient presents with   Chest Pain    Thomas [name] is a 66 y.o. male.  Patient complains of some shortness of breath.  Patient has history of lung cancer and recently had a fluid strain from his lung  The history is provided by the patient. No language interpreter was used.  Chest Pain Pain location:  L chest Pain quality: aching   Pain radiates to:  Does not radiate Pain severity:  Moderate Timing:  Constant Progression:  Worsening Chronicity:  New Context: breathing   Associated symptoms: no abdominal pain, no back pain, no cough, no fatigue and no headache       Home Medications Prior to Admission medications   Medication Sig Start Date End Date Taking? Authorizing Provider  albuterol (PROVENTIL) (2.5 MG/3ML) 0.083% nebulizer solution Take 3 mLs (2.5 mg total) by nebulization every 6 (six) hours as needed for wheezing or shortness of breath. 10/15/20  Yes Icard, Bradley L, DO  albuterol (VENTOLIN HFA) 108 (90 Base) MCG/ACT inhaler Inhale 2 puffs into the lungs every 6 (six) hours as needed for wheezing or shortness of breath.   Yes [provider]  aspirin EC 81 MG tablet Take 81 mg by mouth daily.   Yes [provider]  atorvastatin (LIPITOR) 80 MG tablet TAKE ONE TABLET BY MOUTH ONCE DAILY AT 6 PM. Patient taking differently: Take 80 mg by mouth daily. 04/01/21  Yes Satira Sark, MD  canagliflozin (INVOKANA) 300 MG TABS tablet Take 300 mg by mouth daily before breakfast.   Yes [provider]  carvedilol (COREG) 12.5 MG tablet Take 1 tablet (12.5 mg total) by mouth 2 (two) times daily. 11/16/20  Yes Satira Sark, MD  clopidogrel (PLAVIX) 75 MG tablet TAKE ONE TABLET BY MOUTH DAILY WITH BREAKFAST Patient taking differently: Take 75 mg by mouth daily. 04/01/21  Yes Satira Sark, MD  docusate sodium  (COLACE) 100 MG capsule Take 100 mg by mouth daily as needed for mild constipation.   Yes [provider]  furosemide (LASIX) 20 MG tablet TAKE ONE TABLET BY MOUTH TWICE DAILY Patient taking differently: Take 20 mg by mouth daily. 04/01/21  Yes Satira Sark, MD  ibuprofen (ADVIL) 200 MG tablet Take 600 mg by mouth 2 (two) times daily.   Yes [provider]  isosorbide mononitrate (IMDUR) 60 MG 24 hr tablet TAKE ONE TABLET BY MOUTH EVERY DAY Patient taking differently: Take 60 mg by mouth daily. 04/01/21  Yes Satira Sark, MD  ketoconazole (NIZORAL) 2 % cream Apply 1 application topically daily as needed for irritation. 05/15/20  Yes [provider]  metFORMIN (GLUCOPHAGE-XR) 500 MG 24 hr tablet Take 1,000 mg by mouth 2 (two) times daily. 11/25/18  Yes [provider]  NEOMYCIN-POLYMYXIN-HYDROCORTISONE (CORTISPORIN) 1 % SOLN OTIC solution Place 3 drops into both ears as needed. 10/09/20  Yes [provider]  nitroGLYCERIN (NITROSTAT) 0.4 MG SL tablet DISSOLVE ONE TABLET UNDER TONGUE EVERY 5 MINUTES UP TO 3 DOSES AS NEEDED FOR CHEST PAIN Patient taking differently: Place 0.4 mg under the tongue every 5 (five) minutes as needed for chest pain. 06/07/21  Yes Satira Sark, MD  omeprazole (PRILOSEC) 40 MG capsule Take 40 mg by mouth daily.   Yes [provider]  potassium chloride (KLOR-CON) 10 MEQ tablet Take 1 tablet (  10 mEq total) by mouth 2 (two) times daily. Patient taking differently: Take 10 mEq by mouth daily. 05/05/20  Yes Shah, Pratik D, DO  sertraline (ZOLOFT) 25 MG tablet Take 1 tablet by mouth daily. 06/07/21  Yes [provider]  sodium chloride (OCEAN) 0.65 % nasal spray Place 1 spray into the nose as needed for congestion.   Yes [provider]      Allergies    Glipizide, Codeine, and Doxycycline    Review of Systems   Review of Systems  Constitutional:  Negative for appetite change and fatigue.   HENT:  Negative for congestion, ear discharge and sinus pressure.   Eyes:  Negative for discharge.  Respiratory:  Negative for cough.   Cardiovascular:  Positive for chest pain.  Gastrointestinal:  Negative for abdominal pain and diarrhea.  Genitourinary:  Negative for frequency and hematuria.  Musculoskeletal:  Negative for back pain.  Skin:  Negative for rash.  Neurological:  Negative for seizures and headaches.  Psychiatric/Behavioral:  Negative for hallucinations.    Physical Exam Updated Vital Signs BP 131/62    Pulse 78    Temp 97.9 F (36.6 C) (Oral)    Resp (!) 23    Ht 5\' 9"  (1.753 m)    Wt 116.6 kg    SpO2 91%    BMI 37.95 kg/m  Physical Exam Constitutional:      Appearance: He is well-developed.  HENT:     Head: Normocephalic.  Eyes:     General: No scleral icterus.    Conjunctiva/sclera: Conjunctivae normal.  Neck:     Thyroid: No thyromegaly.  Cardiovascular:     Rate and Rhythm: Normal rate and regular rhythm.     Heart sounds: No murmur heard.   No friction rub. No gallop.  Pulmonary:     Breath sounds: No stridor. No wheezing or rales.  Chest:     Chest wall: No tenderness.  Abdominal:     General: There is no distension.     Tenderness: There is no abdominal tenderness. There is no rebound.  Musculoskeletal:        General: Normal range of motion.     Cervical back: Neck supple.  Lymphadenopathy:     Cervical: No cervical adenopathy.  Skin:    Findings: No erythema or rash.  Neurological:     Mental Status: He is oriented to person, place, and time.     Motor: No abnormal muscle tone.     Coordination: Coordination normal.  Psychiatric:        Behavior: Behavior normal.    ED Results / Procedures / Treatments   Labs (all labs ordered are listed, but only abnormal results are displayed) Labs Reviewed  CBC WITH DIFFERENTIAL/PLATELET - Abnormal; Notable for the following components:      Result Value   Lymphs Abs 0.4 (*)    All other  components within normal limits  BASIC METABOLIC PANEL - Abnormal; Notable for the following components:   Chloride 97 (*)    CO2 33 (*)    Glucose, Bld 149 (*)    Creatinine, Ser 0.54 (*)    Calcium 8.6 (*)    All other components within normal limits  HEPATIC FUNCTION PANEL - Abnormal; Notable for the following components:   Albumin 3.3 (*)    AST 13 (*)    All other components within normal limits  CBG MONITORING, ED - Abnormal; Notable for the following components:   Glucose-Capillary 146 (*)  All other components within normal limits  RESP PANEL BY RT-PCR (FLU A&B, COVID) [id number]  BRAIN NATRIURETIC PEPTIDE  TROPONIN I (HIGH SENSITIVITY)  TROPONIN I (HIGH SENSITIVITY)    EKG EKG Interpretation  Date/Time:  Tuesday June 22 2021 06:46:05 EST Ventricular Rate:  92 PR Interval:  183 QRS Duration: 103 QT Interval:  360 QTC Calculation: 446 R Axis:   -3 Text Interpretation: Sinus rhythm Anterior infarct, old Minimal ST elevation, inferior leads Confirmed by Milton Ferguson [phone]) on 06/22/2021 7:12:07 AM  Radiology DG Chest 1 View  Result Date: 06/22/2021 CLINICAL DATA:  Post RIGHT thoracentesis, RIGHT pleural effusion EXAM: CHEST  1 VIEW COMPARISON:  Earlier study 06/22/2021 FINDINGS: Expiratory AP chest radiograph performed. Residual RIGHT pleural effusion and basilar atelectasis identified. Enlargement of cardiac silhouette. No pneumothorax following thoracentesis. IMPRESSION: No pneumothorax following RIGHT thoracentesis. Electronically Signed   By: Lavonia Dana M.D.   On: 06/22/2021 14:37   DG Chest Port 1 View  Result Date: 06/22/2021 CLINICAL DATA:  66 year old male with history of chest pain. EXAM: PORTABLE CHEST 1 VIEW COMPARISON:  Chest x-ray 06/18/2021. FINDINGS: Opacities throughout the right mid to lower hemithorax, compatible with a large right pleural effusion with extensive areas of atelectasis and/or consolidation in the underlying lung. Left lung is clear. No left  pleural effusion. No pneumothorax. No evidence of pulmonary edema. Heart size appears borderline enlarged. Upper mediastinal contours are within normal limits. IMPRESSION: 1. Large right pleural effusion with atelectasis and/or consolidation in the right mid to lower lung. Electronically Signed   By: Vinnie Langton M.D.   On: 06/22/2021 07:09   US THORACENTESIS ASP PLEURAL SPACE W/IMG GUIDE  Result Date: 06/22/2021 INDICATION: RIGHT lung cancer post radiation CT therapy, recurrent RIGHT pleural effusion EXAM: ULTRASOUND GUIDED THERAPEUTIC RIGHT THORACENTESIS MEDICATIONS: None. COMPLICATIONS: None immediate. PROCEDURE: An ultrasound guided thoracentesis was thoroughly discussed with the patient and questions answered. The benefits, risks, alternatives and complications were also discussed. The patient understands and wishes to proceed with the procedure. Written consent was obtained. Ultrasound was performed to localize and mark an adequate pocket of fluid in the RIGHT chest. The area was then prepped and draped in the normal sterile fashion. 1% Lidocaine was used for local anesthesia. Under ultrasound guidance a 6 French Safe-T-Centesis catheter was introduced. Thoracentesis was performed. The catheter was removed and a dressing applied. FINDINGS: A total of approximately 500 mL of dark old bloody fluid was removed. IMPRESSION: Successful ultrasound guided RIGHT thoracentesis yielding 500 mL of old bloody appearing pleural fluid. Electronically Signed   By: Lavonia Dana M.D.   On: 06/22/2021 14:48    Procedures Procedures    Medications Ordered in ED Medications  sodium chloride 0.9 % bolus 250 mL (0 mLs Intravenous Stopped 06/22/21 1138)  lidocaine HCl (PF) (XYLOCAINE) 2 % injection (10 mLs  Given by Other 06/22/21 1359)   CRITICAL CARE Performed by: Milton Ferguson Total critical care time: 45 minutes Critical care time was exclusive of separately billable procedures and treating other  patients. Critical care was necessary to treat or prevent imminent or life-threatening deterioration. Critical care was time spent personally by me on the following activities: development of treatment plan with patient and/or surrogate as well as nursing, discussions with consultants, evaluation of patient's response to treatment, examination of patient, obtaining history from patient or surrogate, ordering and performing treatments and interventions, ordering and review of laboratory studies, ordering and review of radiographic studies, pulse oximetry and re-evaluation of patient's condition.  Patient with some chest discomfort from his lung cancer accumulating fluid in his lungs.  I spoke with Dr. Delton Coombes and he recommended doing another paracentesis and he will follow-up in the office ED Course/ Medical Decision Making/ A&P                           Medical Decision Making Amount and/or Complexity of Data Reviewed Labs: ordered.  This patient presents to the ED for concern of chest pain, this involves an extensive number of treatment options, and is a complaint that carries with it a high risk of complications and morbidity.  The differential diagnosis includes MI, pleural fluid   Co morbidities that complicate the patient evaluation  Lung cancer   Additional history obtained:  Additional history obtained from patient External records from outside source obtained and reviewed including hospital rec   Lab Tests:  I Ordered, and personally interpreted labs.  The pertinent results include: CBC and chemistries along with troponin that were unremarkable except for glucose elevated 1.9 calcium low at 8.6   Imaging Studies ordered:  I ordered imaging studies including chest I independently visualized and interpreted imaging which showed large pleural effusion on the right I agree with the radiologist interpretation   Cardiac Monitoring:  The patient was maintained on a  cardiac monitor.  I personally viewed and interpreted the cardiac monitored which showed an underlying rhythm of: Normal sinus rhythm   Medicines ordered and prescription drug management:  I ordered medication including normal saline for dehydration Reevaluation of the patient after these medicines showed that the patient improved I have reviewed the patients home medicines and have made adjustments as needed   Test Considered:  None   Critical Interventions:  Thoracentesis   Consultations Obtained:  I requested consultation with the oncology,  and discussed lab and imaging findings as well as pertinent plan - they recommend: Do thoracentesis and have patient follow-up in office   Problem List / ED Course:  Lung cancer with pleural fluid    Social Determinants of Health:  None    Patient with pleural fluid in his right chest causing chest pain.  He has lung cancer.  He had fluid removed by the radiologist.  Patient improved.  He will follow-up with oncology  r discussion with family members, caretakers, and with consultants:1}   Final Clinical Impression(s) / ED Diagnoses Final diagnoses:  Fluid in pleural cavity associated with liver disease  Status post thoracentesis  Nonspecific chest pain    Rx / DC Orders ED Discharge Orders     None         Milton Ferguson, MD 06/23/21 1622

## 2021-06-24 ENCOUNTER — Other Ambulatory Visit (HOSPITAL_COMMUNITY): Payer: Self-pay

## 2021-06-24 DIAGNOSIS — C3490 Malignant neoplasm of unspecified part of unspecified bronchus or lung: Secondary | ICD-10-CM

## 2021-06-24 NOTE — Progress Notes (Signed)
Order for US Thoracentesis x4 if needed placed per Dr. Delton Coombes ?

## 2021-06-29 ENCOUNTER — Other Ambulatory Visit: Payer: Self-pay

## 2021-06-29 ENCOUNTER — Ambulatory Visit (HOSPITAL_COMMUNITY)
Admission: RE | Admit: 2021-06-29 | Discharge: 2021-06-29 | Disposition: A | Discharge status: Home / Self Care | Payer: Medicare HMO | Source: Ambulatory Visit | Attending: Hematology | Admitting: Hematology

## 2021-06-29 ENCOUNTER — Ambulatory Visit (HOSPITAL_COMMUNITY)
Admission: RE | Admit: 2021-06-29 | Discharge: 2021-06-29 | Disposition: A | Discharge status: Home / Self Care | Payer: Medicare HMO | Source: Ambulatory Visit | Attending: Diagnostic Radiology | Admitting: Diagnostic Radiology

## 2021-06-29 ENCOUNTER — Encounter (HOSPITAL_COMMUNITY): Payer: Self-pay

## 2021-06-29 DIAGNOSIS — Z9889 Other specified postprocedural states: Secondary | ICD-10-CM

## 2021-06-29 DIAGNOSIS — J90 Pleural effusion, not elsewhere classified: Secondary | ICD-10-CM | POA: Diagnosis not present

## 2021-06-29 DIAGNOSIS — C3491 Malignant neoplasm of unspecified part of right bronchus or lung: Secondary | ICD-10-CM | POA: Diagnosis present

## 2021-06-29 HISTORY — DX: Malignant (primary) neoplasm, unspecified: C80.1

## 2021-06-29 NOTE — Progress Notes (Signed)
Successful right sided thoracentesis completed and 700 mL of dark red fluid removed. PT taken to chest xray and read by radiologist post procedure. PT pushed to front entrance of the hospital at this time via wheelchair and pt verbalized understanding of discharge instructions. PT left with no acute distress noted and stable vital signs.  ?

## 2021-06-29 NOTE — Procedures (Signed)
PreOperative Dx: Recurrent right pleural effusion pleural effusion ?Postoperative Dx: Recurrent right pleural effusion pleural effusion ?Procedure:   US guided RIGHT thoracentesis ?Radiologist:  Thornton Papas ?Anesthesia:  10 ml of 1% lidocaine ?Specimen:  700 mL of dark old bloody colored fluid ?EBL:   < 1 ml ?Complications: None.   ?

## 2021-07-01 ENCOUNTER — Other Ambulatory Visit: Payer: Self-pay | Admitting: Cardiology

## 2021-07-05 ENCOUNTER — Ambulatory Visit (HOSPITAL_COMMUNITY)
Admission: RE | Admit: 2021-07-05 | Discharge: 2021-07-05 | Disposition: A | Discharge status: Home / Self Care | Payer: Medicare HMO | Source: Ambulatory Visit | Attending: Diagnostic Radiology | Admitting: Diagnostic Radiology

## 2021-07-05 ENCOUNTER — Other Ambulatory Visit: Payer: Self-pay

## 2021-07-05 ENCOUNTER — Ambulatory Visit (HOSPITAL_COMMUNITY)
Admission: RE | Admit: 2021-07-05 | Discharge: 2021-07-05 | Disposition: A | Discharge status: Home / Self Care | Payer: Medicare HMO | Source: Ambulatory Visit | Attending: Hematology | Admitting: Hematology

## 2021-07-05 ENCOUNTER — Encounter (HOSPITAL_COMMUNITY): Payer: Self-pay

## 2021-07-05 ENCOUNTER — Ambulatory Visit (HOSPITAL_COMMUNITY): Payer: Medicare HMO | Admitting: Hematology

## 2021-07-05 DIAGNOSIS — J910 Malignant pleural effusion: Secondary | ICD-10-CM | POA: Diagnosis not present

## 2021-07-05 DIAGNOSIS — C3490 Malignant neoplasm of unspecified part of unspecified bronchus or lung: Secondary | ICD-10-CM | POA: Insufficient documentation

## 2021-07-05 NOTE — Progress Notes (Signed)
Pt tolerated right sided thoracentesis procedure well today and 1 Liters of dark red colored fluid removed. Post chest xray completed and read by radiologist prior to pt leaving. PT verbalized understanding of discharge instructions and left via wheelchair with no acute distress noted.  ?

## 2021-07-06 ENCOUNTER — Inpatient Hospital Stay (HOSPITAL_COMMUNITY): Payer: Medicare HMO | Attending: Hematology | Admitting: Hematology

## 2021-07-06 ENCOUNTER — Encounter (HOSPITAL_COMMUNITY): Payer: Self-pay

## 2021-07-06 DIAGNOSIS — R059 Cough, unspecified: Secondary | ICD-10-CM | POA: Insufficient documentation

## 2021-07-06 DIAGNOSIS — R110 Nausea: Secondary | ICD-10-CM | POA: Insufficient documentation

## 2021-07-06 DIAGNOSIS — F17210 Nicotine dependence, cigarettes, uncomplicated: Secondary | ICD-10-CM | POA: Insufficient documentation

## 2021-07-06 DIAGNOSIS — J910 Malignant pleural effusion: Secondary | ICD-10-CM | POA: Insufficient documentation

## 2021-07-06 DIAGNOSIS — R5383 Other fatigue: Secondary | ICD-10-CM | POA: Insufficient documentation

## 2021-07-06 DIAGNOSIS — Z7982 Long term (current) use of aspirin: Secondary | ICD-10-CM | POA: Insufficient documentation

## 2021-07-06 DIAGNOSIS — R42 Dizziness and giddiness: Secondary | ICD-10-CM | POA: Diagnosis not present

## 2021-07-06 DIAGNOSIS — R109 Unspecified abdominal pain: Secondary | ICD-10-CM | POA: Insufficient documentation

## 2021-07-06 DIAGNOSIS — Z79899 Other long term (current) drug therapy: Secondary | ICD-10-CM | POA: Diagnosis not present

## 2021-07-06 DIAGNOSIS — C3490 Malignant neoplasm of unspecified part of unspecified bronchus or lung: Secondary | ICD-10-CM

## 2021-07-06 DIAGNOSIS — I509 Heart failure, unspecified: Secondary | ICD-10-CM | POA: Diagnosis not present

## 2021-07-06 DIAGNOSIS — R0602 Shortness of breath: Secondary | ICD-10-CM | POA: Diagnosis not present

## 2021-07-06 DIAGNOSIS — C3431 Malignant neoplasm of lower lobe, right bronchus or lung: Secondary | ICD-10-CM | POA: Diagnosis present

## 2021-07-06 NOTE — Progress Notes (Signed)
? ?[facility] ?[address] ?[place], [place] [address] ? ? ?CLINIC:  ?Medical Oncology/Hematology ? ?PCP:  ?Leonie Douglas, MD ?[address] [place] [address] ?[phone] ? ? ?REASON FOR VISIT:  ?Follow-up for non-small cell lung cancer ? ?PRIOR THERAPY: none ? ?NGS Results: not done ? ?CURRENT THERAPY: surveillance ? ?BRIEF ONCOLOGIC HISTORY:  ?Oncology History  ?Non-small cell lung cancer ([place])  ?12/19/2018 Imaging  ? 1. Small to moderate size right-sided pleural effusion that appears to be at least partially loculated. ?2. Airspace consolidation involving the right middle lobe and right lower lobe concerning for multifocal pneumonia. Follow-up to resolution is recommended as an underlying mass cannot be excluded. ?3. There are few scattered 4 mm pulmonary nodules as detailed above. No follow-up needed if patient is low-risk (and has no known or suspected primary neoplasm). Non-contrast chest CT can be considered in 12 months if patient is high-risk.  ?4. Right-sided thyroid nodule as detailed above. This can be further evaluated with ultrasound as clinically indicated. ?5. Hepatic steatosis. ?6. Advanced coronary artery calcifications are noted. ?7. Dilated main pulmonary artery which can be seen in patients with elevated pulmonary artery pressures. ?  ?04/28/2020 Imaging  ? ECHO ? 1. Left ventricular ejection fraction, by estimation, is 55 to 60%. The left ventricle has normal function. The left ventricle has no regional wall motion abnormalities. Left ventricular diastolic parameters were normal.  ? 2. Right ventricular systolic function is normal. The right ventricular size is normal.  ? 3. The mitral valve is normal in structure. No evidence of mitral valve regurgitation. No evidence of mitral stenosis.  ? 4. The aortic valve was not well visualized.  ? 5. The inferior vena cava is normal in size with greater than 50% respiratory variability, suggesting right atrial pressure of 3 mmHg.   ?  ?10/07/2020 Imaging  ? CT chest ?1. Lung-RADS 4B, suspicious. Additional imaging evaluation or consultation with Pulmonology or Thoracic Surgery recommended. Right middle and lower lobe collapse associated with moderate right pleural effusion. Occlusion of the bronchus intermedius evident. Central obstructing mass lesion a concern. CT chest with contrast recommended to further evaluate. These changes have been slowly progressive since 12/19/2018 and if neoplastic etiology has already been excluded, then given the degree of collapse/consolidation in the right lung, this patient is not a candidate for lung cancer screening. ?2. Enlargement of the pulmonary outflow tract and main pulmonary arteries suggests pulmonary arterial hypertension. ?3. Aortic Atherosclerosis (ICD10-I70.0). ?  ?10/30/2020 Pathology Results  ? Clinical History: Endobronchial lesion  ?Specimen Submitted:  B. LUNG, RLL SUPERIOR  , BRUSHING:  ? ? ?FINAL MICROSCOPIC DIAGNOSIS:  ?- Malignant cells consistent with squamous cell carcinoma  ?  ?10/30/2020 Pathology Results  ? FINAL MICROSCOPIC DIAGNOSIS:  ?- Malignant cells consistent with squamous cell carcinoma  ?  ?10/30/2020 Procedure  ? Pre-op Diagnosis: Collapsed RLL ?  ?Post-op Diagnosis: Endobronchial mass, RLL superior segment  ?  ?Surgeon: Octavio Graves Icard,DO  ?Operation: Flexible video fiberoptic bronchoscopy and biopsies. ?Indications and History: ?CORTNEY MCKINNEY is 66 y.o. with history of RLL collapse, and smoking.  Recommendation was to perform video fiberoptic bronchoscopy with biopsies. The risks, benefits, complications, treatment options and expected outcomes were discussed with the patient.  The possibilities of pneumothorax, pneumonia, reaction to medication, pulmonary aspiration, perforation of a viscus, bleeding, failure to diagnose a condition and creating a complication requiring transfusion or operation were discussed with the patient who freely signed the consent.   ?   ?  Description of Procedure: ?The patient was seen in the Preoperative Area, was examined and was deemed appropriate to proceed.  The patient was taken to [facility] endoscopy room 2, identified as Tenna Delaine and the procedure verified as Flexible Video Fiberoptic Bronchoscopy.  A Time Out was held and the above information confirmed. ?  ?Therapeutic bronchoscope was inserted through the endotracheal tube and a general inspection was performed which showed normal cords, normal trachea, normal main carina. The R sided airways were inspected and showed normal RUL, BI, RML and the right lower lobe superior segment revealed a small endobronchial mass arising from the opening. The L side was then inspected. The LLL, Lingular and LUL airways were normal.  ?  ?Using the standard therapeutic bronchoscope, and 2.8 mm [employer] forceps biopsies were taken from the right lower lobe superior segment opening and visible endobronchial tumor.  We also completed cytology brushings to the right lower lobe superior segment as well as BAL specimen to be sent for cytology to the right lower lobe. ? ?Following biopsies we switched to the Olympus endobronchial ultrasound scope.  We were able to visualize the right lower lobe area of collapsed lung.  There was no clear identified mass under ultrasound.  I suspect that the lesion that we are seeing just arises from the opening of the right lower lobe superior segment.  Due to the area of atelectasis around the lesion was difficult to identify a discrete mass.  No samples were taken under ultrasound guidance. ?  ?Samples: ?1. Endobronchial brushings from right lower lobe ?2. Endobronchial forceps biopsies from right lower lobe ?3. Bronchial washings from right lower lobe ?  ?11/11/2020 Initial Diagnosis  ? Non-small cell lung cancer ([place]) ?  ?11/11/2020 Cancer Staging  ? Staging form: Lung, AJCC 8th Edition ?- Clinical: Stage IVA (cT4, cN0, cM1a) - Signed by Heath Lark, MD on  11/11/2020 ? ?  ?11/25/2020 Genetic Testing  ? PD-L1 Results: ? ? ?  ? ? ?CANCER STAGING: ? Cancer Staging  ?Non-small cell lung cancer ([place]) ?Staging form: Lung, AJCC 8th Edition ?- Clinical: Stage IVA (cT4, cN0, cM1a) - Signed by Heath Lark, MD on 11/11/2020 ? ? ?INTERVAL HISTORY:  ?Mr. Tenna Delaine, a 66 y.o. male, returns for routine follow-up of his non-small cell lung cancer. Darus was last seen on 12/17/2020.  ? ?Today he reports feeling well. He reports abdominal pain, abdominal cramping, belching, and light headedness which occurs at night; he reports he has had this abdominal pain since 2011. He reports he tried to take stool softener, and he experienced cramping.  He reports mild SOB, and he denies CP. ? ?REVIEW OF SYSTEMS:  ?Review of Systems  ?Constitutional:  Positive for fatigue. Negative for appetite change.  ?Respiratory:  Positive for cough and shortness of breath (2L O2).   ?Cardiovascular:  Negative for chest pain.  ?Gastrointestinal:  Positive for abdominal pain (5/10) and nausea.  ?Neurological:  Positive for dizziness.  ?All other systems reviewed and are negative. ? ?PAST MEDICAL/SURGICAL HISTORY:  ?Past Medical History:  ?Diagnosis Date  ? Arthritis   ? Bone spur   ? cervical - numbness left arm and hand  ? Cancer [facility])   ? CHF (congestive heart failure) ([place])   ? COPD (chronic obstructive pulmonary disease) ([place])   ? Coronary artery disease   ? Dyspnea   ? Oxygen 2.5L via [place] qhs and occasionally during the day prn  ? Fatty liver   ? GERD (gastroesophageal reflux  disease)   ? Morbid obesity ([place])   ? Neuromuscular disorder ([place])   ? NSTEMI (non-ST elevated myocardial infarction) ([place])   ? a. s/p cath in 03/2018 showing 90% OM2 stenosis and nonobstructive CAD along LAD and LCx with medical management recommended  ? Pneumonia   ? x2  ? Secondary cardiomyopathy ([place])   ? a. EF reported as 20-25% by echo in 03/2018 b. EF at 45% by repeat imaging in 10/2018  ? Snoring   ? Type 2 diabetes  mellitus ([place])   ? ?Past Surgical History:  ?Procedure Laterality Date  ? BRONCHIAL BIOPSY  10/30/2020  ? Procedure: BRONCHIAL BIOPSIES;  Surgeon: Garner Nash, DO;  Location: [facility];  Service: Pulmonary;;  ? BRONCHIAL B

## 2021-07-06 NOTE — Progress Notes (Signed)
I met with the patient and his wife today during and after visit with Dr. Delton Coombes. All questions addressed and answered. Patient scheduled appropriately according to Dr. Tomie China recommendation. Encouraged patient and family to call with questions or concerns. ?

## 2021-07-06 NOTE — Patient Instructions (Addendum)
[place] at [facility] ?Discharge Instructions ? ?You were seen and examined today by Dr. Delton Coombes.  ? ?You were referred back to Dr. Delton Coombes because you keep having fluid accumulate within your lungs. This can happen in the presence of cancer. Malignancy is only identified in the fluid being removed 50% of the time. ? ?Dr. Delton Coombes has recommended a PET scan to assess cancer involvement. If the cancer does show cancer presence, Dr. Delton Coombes has recommended Keytruda (immunotherapy). This is not chemotherapy. ? ?Try Sennakot to alleviate potential constipation. Take 2 pills twice daily.  ? ?Follow-up with Dr. Delton Coombes as scheduled. ? ? ?Thank you for choosing [place] at [facility] to provide your oncology and hematology care.  To afford each patient quality time with our provider, please arrive at least 15 minutes before your scheduled appointment time.  ? ?If you have a lab appointment with the [facility] please come in thru the Main Entrance and check in at the main information desk. ? ?You need to re-schedule your appointment should you arrive 10 or more minutes late.  We strive to give you quality time with our providers, and arriving late affects you and other patients whose appointments are after yours.  Also, if you no show three or more times for appointments you may be dismissed from the clinic at the providers discretion.     ?Again, thank you for choosing [facility].  Our hope is that these requests will decrease the amount of time that you wait before being seen by our physicians.       ?_____________________________________________________________ ? ?Should you have questions after your visit to [facility], please contact our office at [phone] and follow the prompts.  Our office hours are 8:00 a.m. and 4:30 p.m. Monday - Friday.  Please note that voicemails left after 4:00 p.m. may not be returned until  the following business day.  We are closed weekends and major holidays.  You do have access to a nurse 24-7, just call the main number to the clinic [phone] and do not press any options, hold on the line and a nurse will answer the phone.   ? ?For prescription refill requests, have your pharmacy contact our office and allow 72 hours.   ? ?Due to Covid, you will need to wear a mask upon entering the hospital. If you do not have a mask, a mask will be given to you at the Main Entrance upon arrival. For doctor visits, patients may have 1 support person age 40 or older with them. For treatment visits, patients can not have anyone with them due to social distancing guidelines and our immunocompromised population.  ? ? ? ?

## 2021-07-07 ENCOUNTER — Encounter (HOSPITAL_COMMUNITY): Payer: Self-pay | Admitting: Emergency Medicine

## 2021-07-07 ENCOUNTER — Emergency Department (HOSPITAL_COMMUNITY)
Admission: EM | Admit: 2021-07-07 | Discharge: 2021-07-07 | Disposition: A | Discharge status: Home / Self Care | Payer: Medicare HMO | Attending: Emergency Medicine | Admitting: Emergency Medicine

## 2021-07-07 ENCOUNTER — Other Ambulatory Visit: Payer: Self-pay

## 2021-07-07 ENCOUNTER — Emergency Department (HOSPITAL_COMMUNITY): Payer: Medicare HMO

## 2021-07-07 DIAGNOSIS — Z7982 Long term (current) use of aspirin: Secondary | ICD-10-CM | POA: Diagnosis not present

## 2021-07-07 DIAGNOSIS — I509 Heart failure, unspecified: Secondary | ICD-10-CM | POA: Diagnosis not present

## 2021-07-07 DIAGNOSIS — E119 Type 2 diabetes mellitus without complications: Secondary | ICD-10-CM | POA: Insufficient documentation

## 2021-07-07 DIAGNOSIS — Z7901 Long term (current) use of anticoagulants: Secondary | ICD-10-CM | POA: Insufficient documentation

## 2021-07-07 DIAGNOSIS — R079 Chest pain, unspecified: Secondary | ICD-10-CM | POA: Insufficient documentation

## 2021-07-07 DIAGNOSIS — I2510 Atherosclerotic heart disease of native coronary artery without angina pectoris: Secondary | ICD-10-CM | POA: Diagnosis not present

## 2021-07-07 DIAGNOSIS — Z79899 Other long term (current) drug therapy: Secondary | ICD-10-CM | POA: Insufficient documentation

## 2021-07-07 DIAGNOSIS — R1010 Upper abdominal pain, unspecified: Secondary | ICD-10-CM | POA: Diagnosis present

## 2021-07-07 DIAGNOSIS — J449 Chronic obstructive pulmonary disease, unspecified: Secondary | ICD-10-CM | POA: Insufficient documentation

## 2021-07-07 DIAGNOSIS — Z7984 Long term (current) use of oral hypoglycemic drugs: Secondary | ICD-10-CM | POA: Diagnosis not present

## 2021-07-07 DIAGNOSIS — K5900 Constipation, unspecified: Secondary | ICD-10-CM | POA: Diagnosis not present

## 2021-07-07 DIAGNOSIS — K567 Ileus, unspecified: Secondary | ICD-10-CM | POA: Insufficient documentation

## 2021-07-07 LAB — CBC
HCT: 43.3 % (ref 39.0–52.0)
Hemoglobin: 13.8 g/dL (ref 13.0–17.0)
MCH: 30.5 pg (ref 26.0–34.0)
MCHC: 31.9 g/dL (ref 30.0–36.0)
MCV: 95.8 fL (ref 80.0–100.0)
Platelets: 243 10*3/uL (ref 150–400)
RBC: 4.52 MIL/uL (ref 4.22–5.81)
RDW: 13.6 % (ref 11.5–15.5)
WBC: 6.3 10*3/uL (ref 4.0–10.5)
nRBC: 0 % (ref 0.0–0.2)

## 2021-07-07 LAB — BASIC METABOLIC PANEL
Anion gap: 11 (ref 5–15)
BUN: 18 mg/dL (ref 8–23)
CO2: 33 mmol/L — ABNORMAL HIGH (ref 22–32)
Calcium: 8.9 mg/dL (ref 8.9–10.3)
Chloride: 98 mmol/L (ref 98–111)
Creatinine, Ser: 0.72 mg/dL (ref 0.61–1.24)
GFR, Estimated: >60 mL/min (ref >60)
Glucose, Bld: 155 mg/dL — ABNORMAL HIGH (ref 70–99)
Potassium: 4 mmol/L (ref 3.5–5.1)
Sodium: 142 mmol/L (ref 135–145)

## 2021-07-07 LAB — TROPONIN I (HIGH SENSITIVITY)
Troponin I (High Sensitivity): 6 ng/L (ref <18)
Troponin I (High Sensitivity): 5 ng/L (ref <18)

## 2021-07-07 MED ORDER — POLYETHYLENE GLYCOL 3350 17 G PO PACK: 17.0000 g | PACK | Freq: Every day | ORAL | 0 refills | Status: DC | PRN

## 2021-07-07 NOTE — ED Provider Notes (Addendum)
?[facility] ?Provider Note ? ? ?CSN: [id number] ?Arrival date & time: 07/07/21  1500 ? ?  ? ?History ? ?Chief Complaint  ?Patient presents with  ? Chest Pain  ? ? ?Thomas [name] is a 66 y.o. male. ? ? ?Chest Pain ?Associated symptoms: no diaphoresis   ?Patient presents with dull left-sided chest pain.  Left chest.  Does have known recurrent pleural effusion on the right side.  Does have known lung mass cancer..  On chronic oxygen at night.  Patient states he is primarily here because he is having constipation.  States has not had a good bowel movement in a few days.  States he wakes up at night feeling as if he has to go but then cannot go.  States he sits there for a while but nothing will happen.  States upper abdomen does feel little full.  Does not feel as if he impacted rectally.  Has had problems like this before.  Got seen by hematology yesterday.  No cough.  No fevers.  States his stools have been dark when he has them.  States he also smelled bad.  States it is just more brown and not black.  Patient states he is not sure if he is on a blood thinner but appears to be on Plavix ?  ?Past Medical History:  ?Diagnosis Date  ? Arthritis   ? Bone spur   ? cervical - numbness left arm and hand  ? Cancer [facility])   ? CHF (congestive heart failure) ([place])   ? COPD (chronic obstructive pulmonary disease) ([place])   ? Coronary artery disease   ? Dyspnea   ? Oxygen 2.5L via [place] qhs and occasionally during the day prn  ? Fatty liver   ? GERD (gastroesophageal reflux disease)   ? Morbid obesity ([place])   ? Neuromuscular disorder ([place])   ? NSTEMI (non-ST elevated myocardial infarction) ([place])   ? a. s/p cath in 03/2018 showing 90% OM2 stenosis and nonobstructive CAD along LAD and LCx with medical management recommended  ? Pneumonia   ? x2  ? Secondary cardiomyopathy ([place])   ? a. EF reported as 20-25% by echo in 03/2018 b. EF at 45% by repeat imaging in 10/2018  ? Snoring   ? Type 2 diabetes mellitus ([place])    ? ? ?Home Medications ?Prior to Admission medications   ?Medication Sig Start Date End Date Taking? Authorizing Provider  ?albuterol (PROVENTIL) (2.5 MG/3ML) 0.083% nebulizer solution Take 3 mLs (2.5 mg total) by nebulization every 6 (six) hours as needed for wheezing or shortness of breath. 10/15/20  Yes Icard, Bradley L, DO  ?albuterol (VENTOLIN HFA) 108 (90 Base) MCG/ACT inhaler Inhale 2 puffs into the lungs every 6 (six) hours as needed for wheezing or shortness of breath.   Yes [provider]  ?aspirin EC 81 MG tablet Take 81 mg by mouth daily.   Yes [provider]  ?atorvastatin (LIPITOR) 80 MG tablet TAKE ONE TABLET BY MOUTH ONCE DAILY AT 6 PM. ?Patient taking differently: Take 80 mg by mouth daily. 04/01/21  Yes Satira Sark, MD  ?bismuth subsalicylate (PEPTO BISMOL) 262 MG/15ML suspension Take 30 mLs by mouth every 6 (six) hours as needed.   Yes [provider]  ?canagliflozin (INVOKANA) 300 MG TABS tablet Take 300 mg by mouth daily before breakfast.   Yes [provider]  ?carvedilol (COREG) 12.5 MG tablet TAKE ONE TABLET BY MOUTH TWICE DAILY 07/01/21  Yes Rozann Lesches  G, MD  ?clopidogrel (PLAVIX) 75 MG tablet TAKE ONE TABLET BY MOUTH DAILY WITH BREAKFAST ?Patient taking differently: Take 75 mg by mouth daily. 04/01/21  Yes Satira Sark, MD  ?docusate sodium (COLACE) 100 MG capsule Take 200 mg by mouth daily as needed for mild constipation.   Yes [provider]  ?furosemide (LASIX) 20 MG tablet TAKE ONE TABLET BY MOUTH TWICE DAILY ?Patient taking differently: Take 20 mg by mouth daily. 04/01/21  Yes Satira Sark, MD  ?ibuprofen (ADVIL) 200 MG tablet Take 600 mg by mouth 2 (two) times daily.   Yes [provider]  ?isosorbide mononitrate (IMDUR) 60 MG 24 hr tablet TAKE ONE TABLET BY MOUTH EVERY DAY ?Patient taking differently: Take 60 mg by mouth daily. 04/01/21  Yes Satira Sark, MD  ?ketoconazole (NIZORAL) 2 % cream Apply  1 application topically daily as needed for irritation. 05/15/20  Yes [provider]  ?metFORMIN (GLUCOPHAGE-XR) 500 MG 24 hr tablet Take 1,000 mg by mouth 2 (two) times daily. 11/25/18  Yes [provider]  ?NEOMYCIN-POLYMYXIN-HYDROCORTISONE (CORTISPORIN) 1 % SOLN OTIC solution Place 3 drops into both ears as needed. 10/09/20  Yes [provider]  ?nitroGLYCERIN (NITROSTAT) 0.4 MG SL tablet DISSOLVE ONE TABLET UNDER TONGUE EVERY 5 MINUTES UP TO 3 DOSES AS NEEDED FOR CHEST PAIN ?Patient taking differently: Place 0.4 mg under the tongue every 5 (five) minutes as needed for chest pain. 06/07/21  Yes Satira Sark, MD  ?omeprazole (PRILOSEC) 40 MG capsule Take 40 mg by mouth daily.   Yes [provider]  ?polyethylene glycol (MIRALAX / GLYCOLAX) 17 g packet Take 17 g by mouth daily as needed for moderate constipation. 07/07/21  Yes Davonna Belling, MD  ?potassium chloride (KLOR-CON) 10 MEQ tablet Take 1 tablet (10 mEq total) by mouth 2 (two) times daily. ?Patient taking differently: Take 10 mEq by mouth daily. 05/05/20  Yes Shah, Pratik D, DO  ?sertraline (ZOLOFT) 25 MG tablet Take 1 tablet by mouth daily. 06/07/21  Yes [provider]  ?sodium chloride (OCEAN) 0.65 % nasal spray Place 1 spray into the nose as needed for congestion.   Yes [provider]  ?   ? ?Allergies    ?Glipizide, Codeine, and Doxycycline   ? ?Review of Systems   ?Review of Systems  ?Constitutional:  Negative for diaphoresis.  ?Cardiovascular:  Positive for chest pain.  ?Gastrointestinal:  Positive for constipation.  ?Genitourinary:  Negative for frequency.  ? ?Physical Exam ?Updated Vital Signs ?BP (!) 152/75   Pulse 95   Temp 97.9 ?F (36.6 ?C) (Oral)   Resp (!) 21   Ht 5\' 9"  (1.753 m)   Wt 113.4 kg   SpO2 96%   BMI 36.92 kg/m?  ?Physical Exam ?Vitals and nursing note reviewed.  ?HENT:  ?   Head: Atraumatic.  ?Cardiovascular:  ?   Rate and Rhythm: Regular rhythm.  ?Pulmonary:  ?    Breath sounds: No wheezing.  ?Chest:  ?   Chest wall: No tenderness.  ?Abdominal:  ?   Tenderness: There is abdominal tenderness.  ?   Comments: Mild upper abdominal tenderness rebound or guarding.  No hernia palpated.  ?Musculoskeletal:  ?   Right lower leg: No tenderness.  ?   Left lower leg: No tenderness.  ?Skin: ?   General: Skin is warm.  ?Neurological:  ?   Mental Status: He is alert.  ? ? ?ED Results / Procedures / Treatments   ?Labs ?(all  labs ordered are listed, but only abnormal results are displayed) ?Labs Reviewed  ?BASIC METABOLIC PANEL - Abnormal; Notable for the following components:  ?    Result Value  ? CO2 33 (*)   ? Glucose, Bld 155 (*)   ? All other components within normal limits  ?CBC  ?POC OCCULT BLOOD, ED  ?TROPONIN I (HIGH SENSITIVITY)  ?TROPONIN I (HIGH SENSITIVITY)  ? ? ?EKG ?EKG Interpretation ? ?Date/Time:  Wednesday July 07 2021 15:15:42 EDT ?Ventricular Rate:  114 ?PR Interval:  188 ?QRS Duration: 90 ?QT Interval:  320 ?QTC Calculation: 441 ?R Axis:   152 ?Text Interpretation: Sinus rhythm Right axis deviation Septal infarct , age undetermined Abnormal ECG When compared with ECG of 22-Jun-2021 06:46, PREVIOUS ECG IS PRESENT pvc and rate increased Confirmed by Davonna Belling [phone]) on 07/07/2021 3:27:10 PM ? ?Radiology ?DG ABD ACUTE 2+V W 1V CHEST ? ?Result Date: 07/07/2021 ?CLINICAL DATA:  Abdominal pain with nausea. EXAM: DG ABDOMEN ACUTE WITH 1 VIEW CHEST COMPARISON:  Chest x-ray 07/05/2021. PET-CT 11/26/2018. FINDINGS: There is a moderate-sized right pleural effusion which has increased. There is likely underlying atelectasis or airspace disease in the right lower lung. No pneumothorax. Left lung is clear. Cardiomediastinal silhouette is stable. There is no free air under the diaphragm. Cholecystectomy clips are present. There is a large amount of stool in the rectum and right colon. Transverse colon is dilated measuring 9 cm. There also mildly dilated small bowel loops in the  left abdomen. There are phleboliths in the pelvis. Degenerative changes affect both hips.  No acute fractures are seen. IMPRESSION: 1. Dilated transverse colon and mildly dilated small bowel loops. Findings may be rel

## 2021-07-07 NOTE — ED Triage Notes (Signed)
Pt reports chest pain, SOB, abd pain and dark stools since this am ?

## 2021-07-07 NOTE — ED Notes (Signed)
Per Dr. Alvino Chapel dc repeat troponin.  ?

## 2021-07-07 NOTE — Discharge Instructions (Signed)
Your x-ray showed ileus versus obstruction.  I think obstruction is less likely.  If you develop more nausea or vomiting you may need more work-up.  You do have a PET scan in a week that can help clear this up too.  Follow-up with your doctor. ?

## 2021-07-10 IMAGING — DX DG CHEST 2V
2 series · 2 of 2 positions shown · non-contrast
Comparison: 04/26/2020

CLINICAL DATA: Short of breath

EXAM:
CHEST - 2 VIEW

[chest pa]
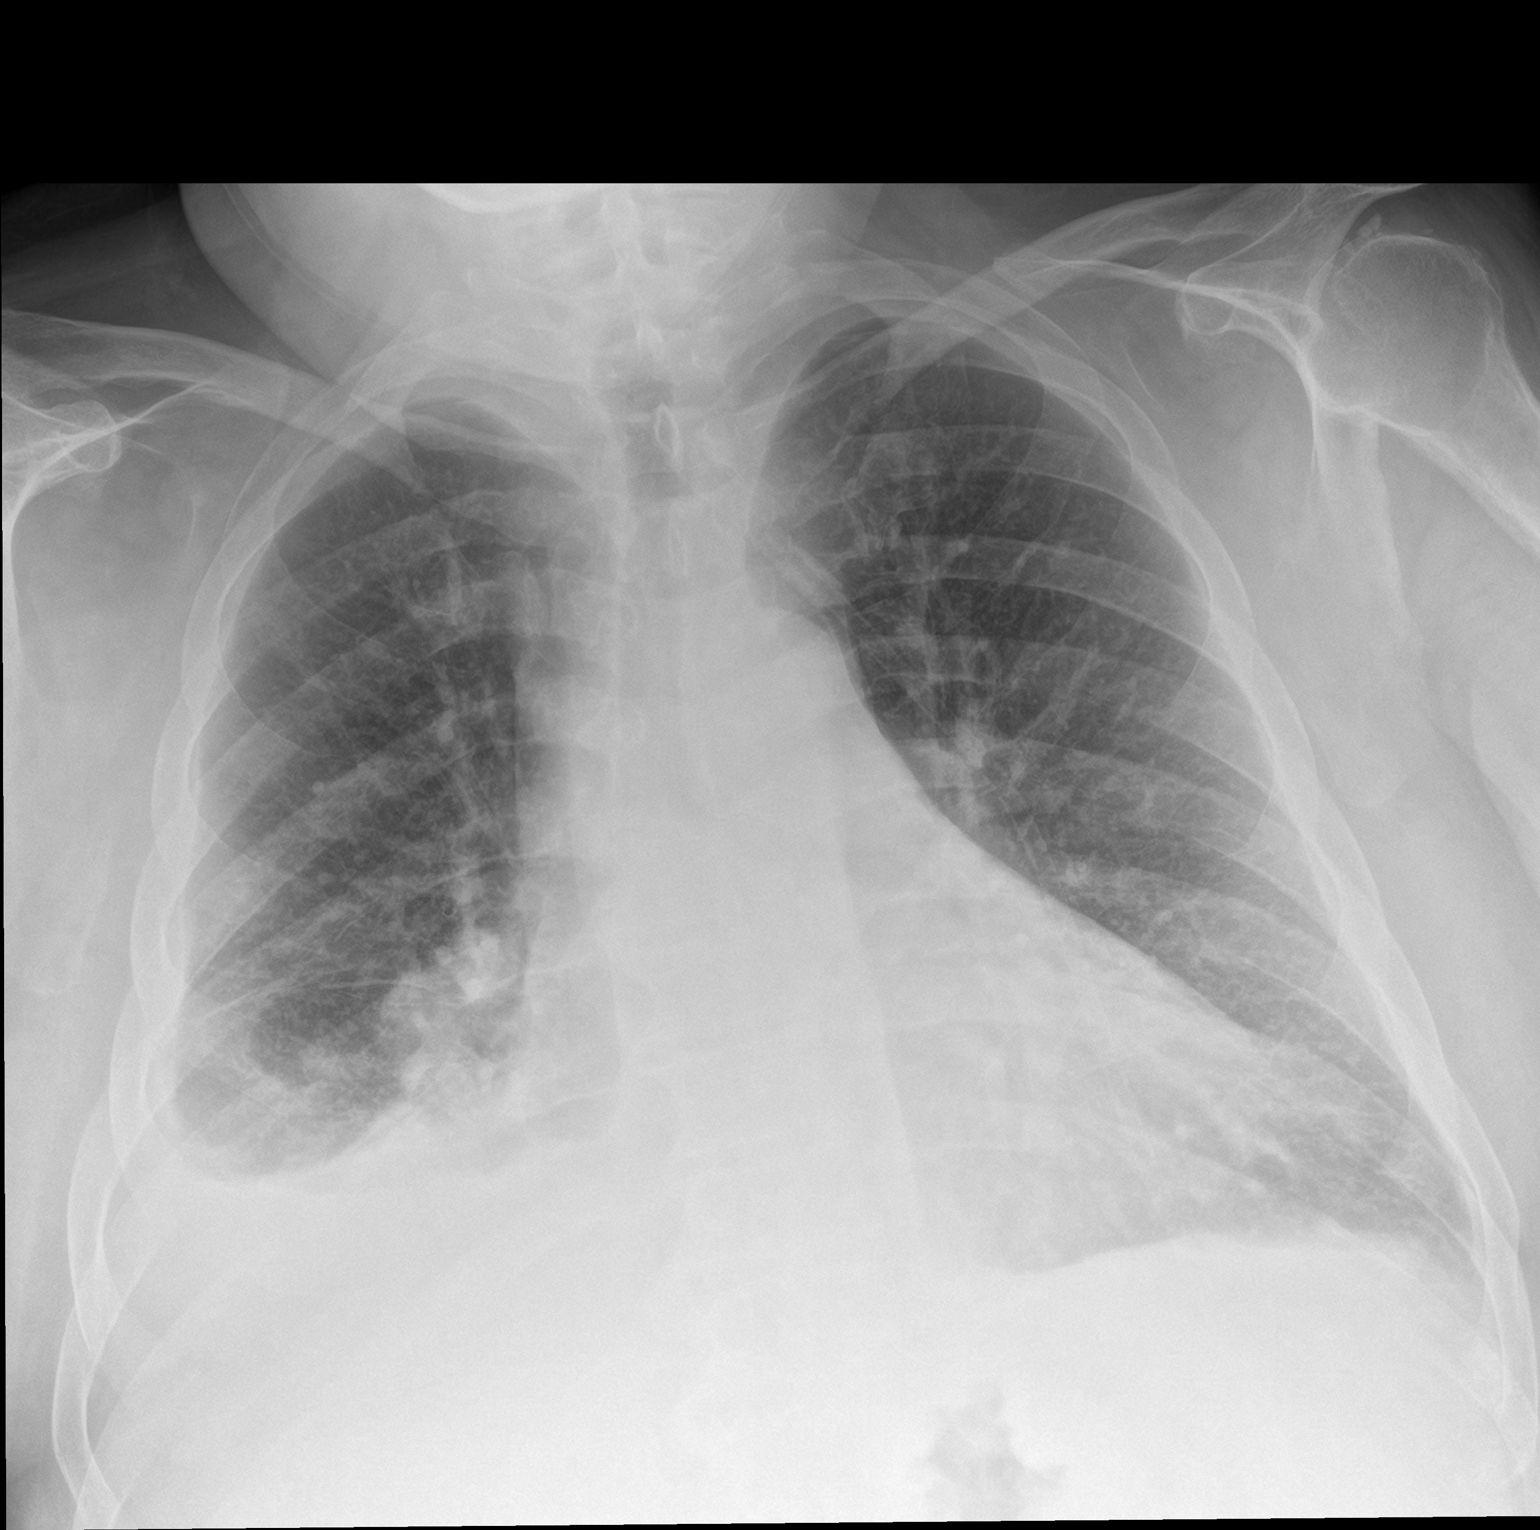

[chest lat]
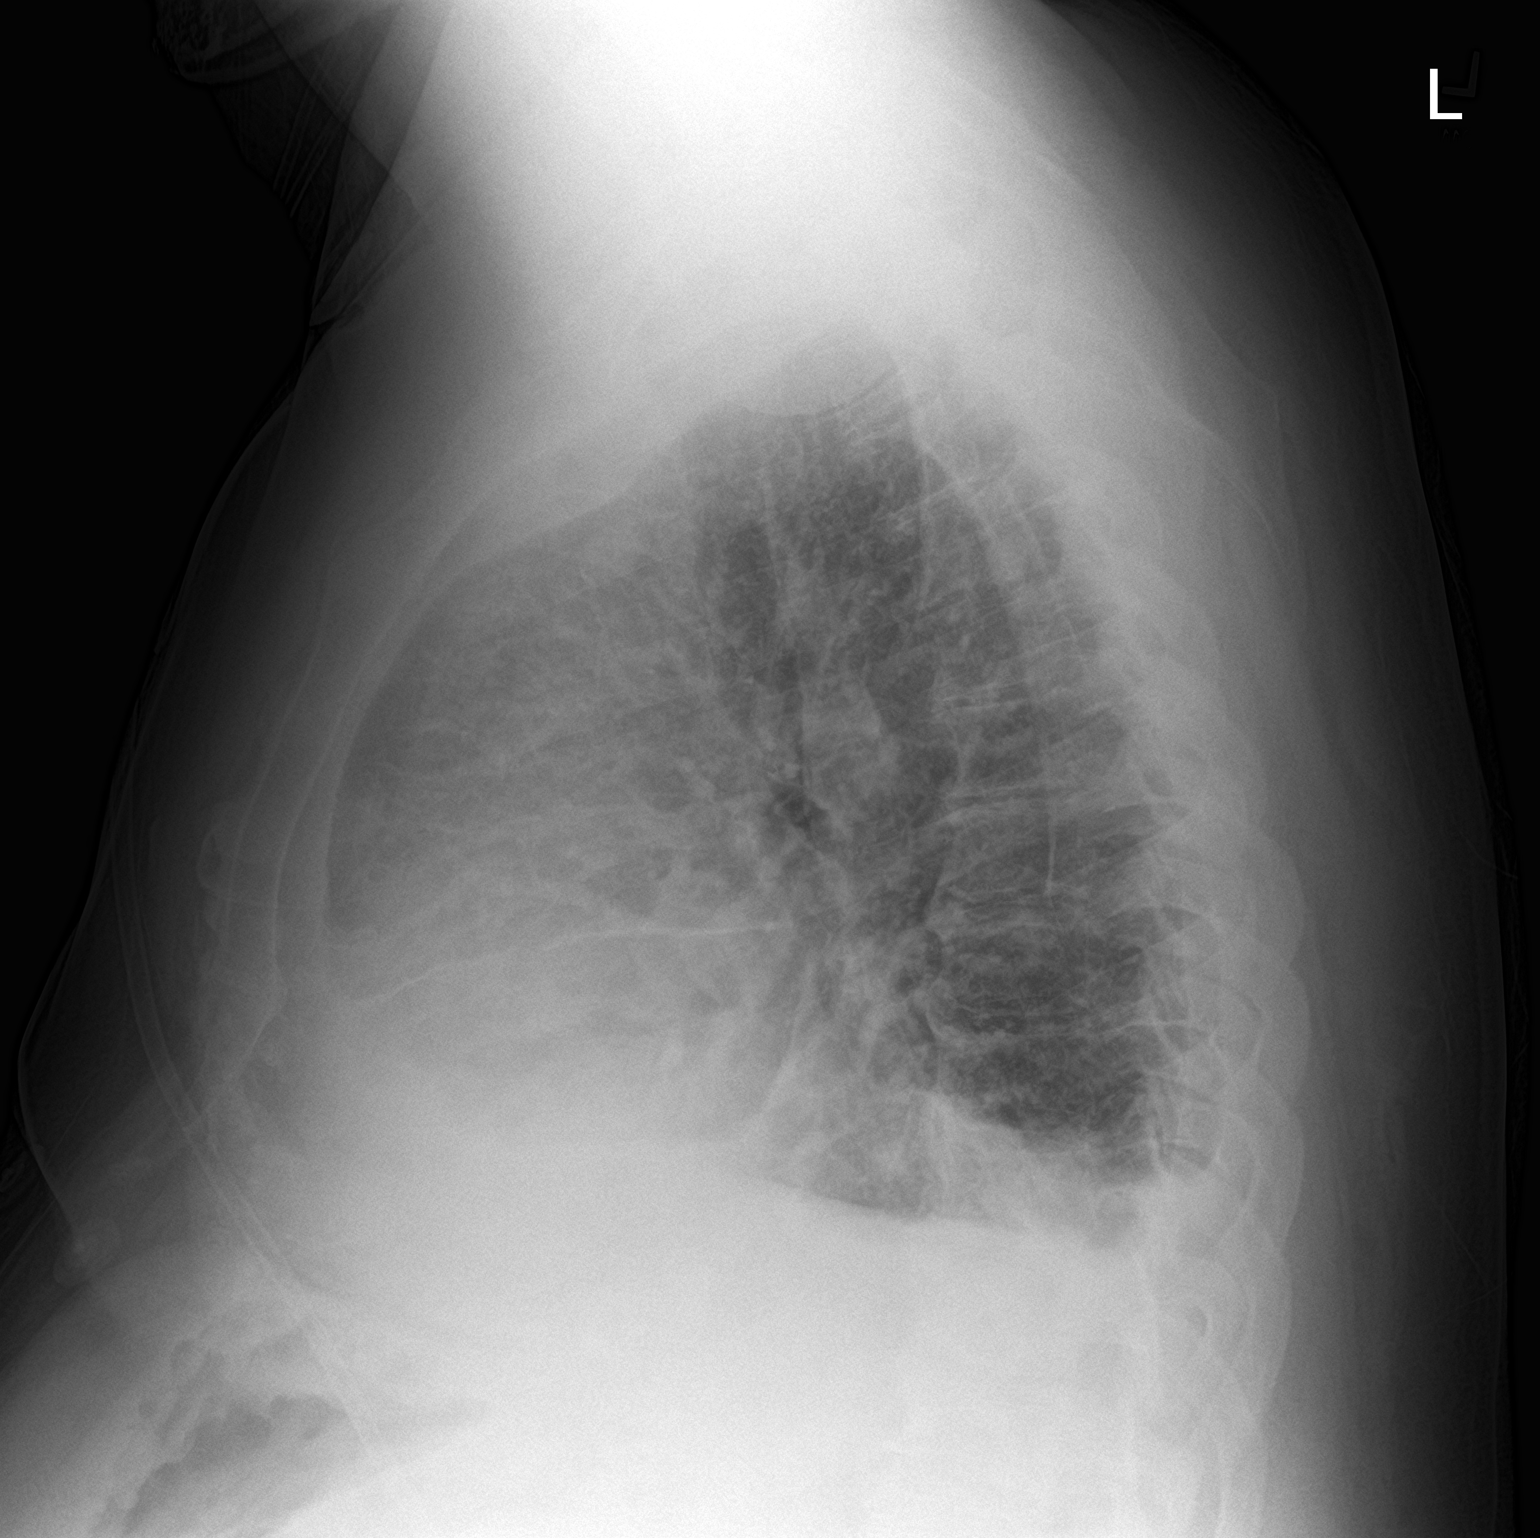

[2 of 2 positions shown; findings below may reference images not displayed]

FINDINGS: Right lower lobe airspace disease and small infiltrate unchanged.

Left lung remains clear.  Negative for heart failure or edema.
IMPRESSION: No interval change. Persistent right lower lobe infiltrate and
effusion consistent with pneumonia.

## 2021-07-11 IMAGING — DX DG CHEST 1V PORT
1 series · 1 of 1 positions shown · non-contrast
Comparison: 04/27/2020

CLINICAL DATA: Respiratory failure

EXAM:
PORTABLE CHEST 1 VIEW

[chest ap grid]
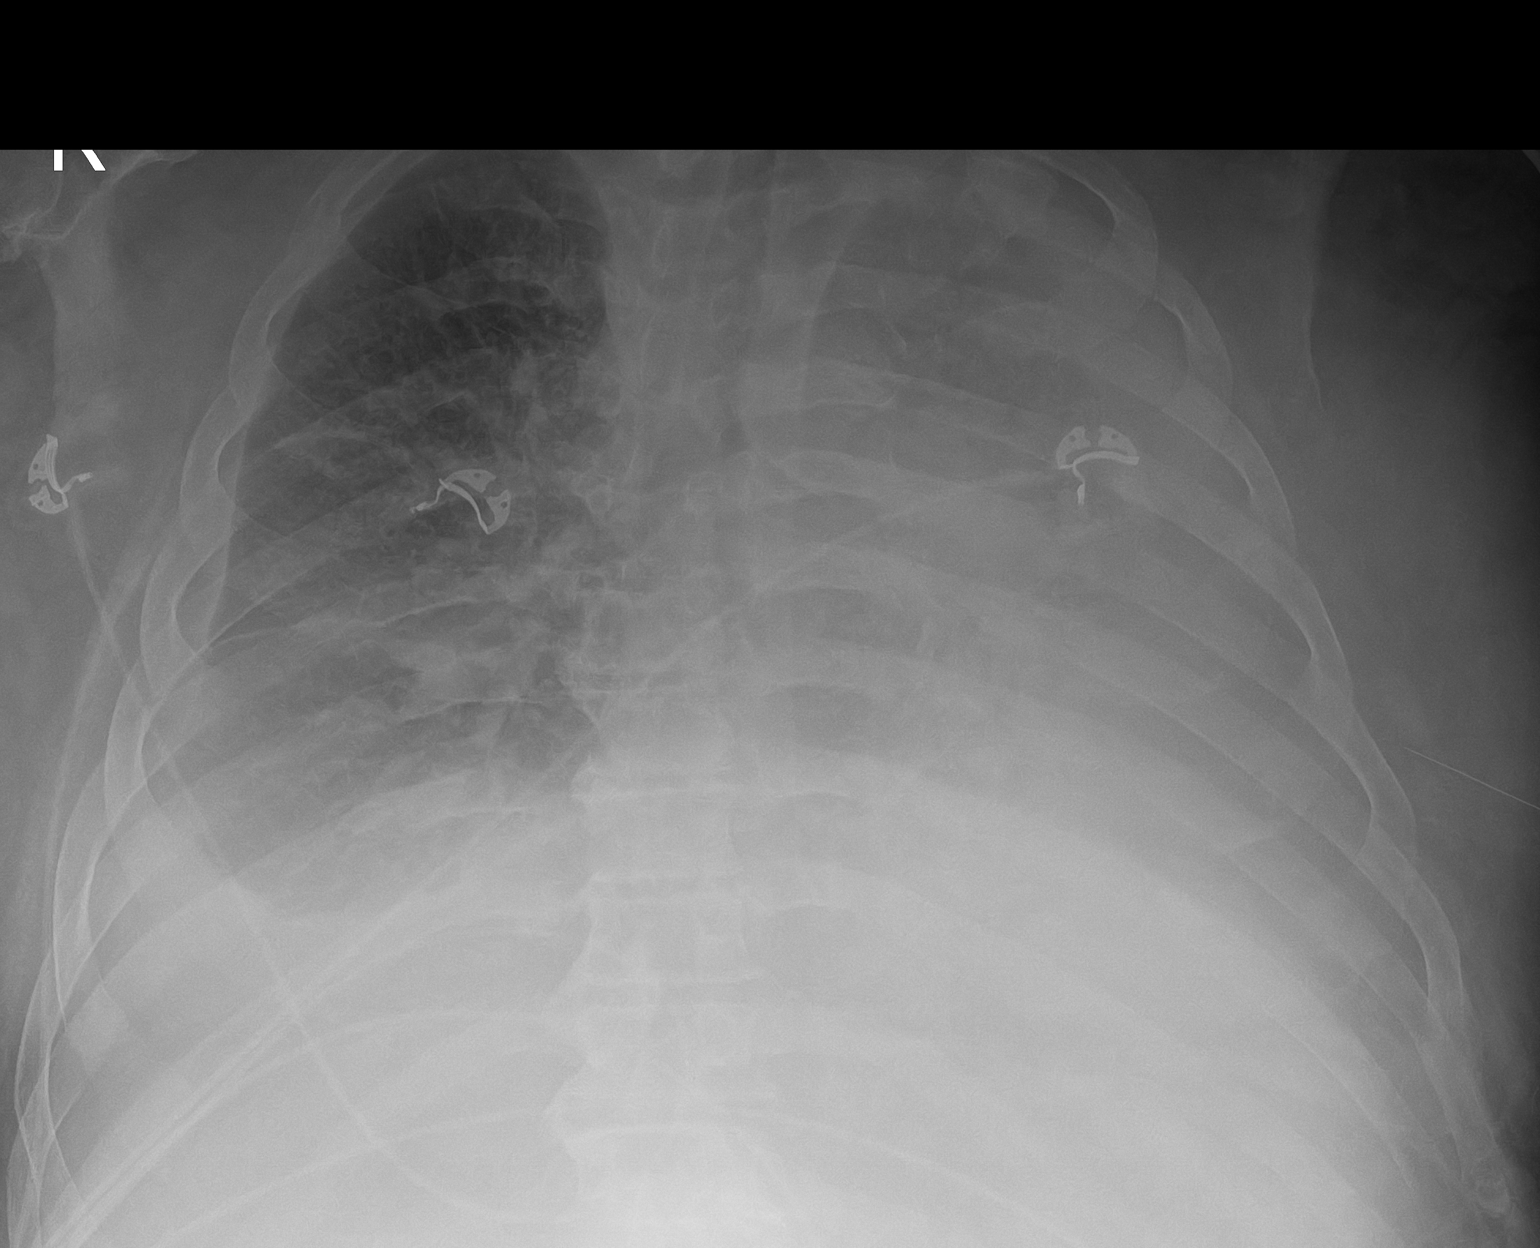

[1 of 1 positions shown; findings below may reference images not displayed]

FINDINGS: New large left pleural effusion and diffuse airspace disease
throughout the left lung. Moderate right pleural effusion, slightly
increased since prior study with increasing right lung airspace
disease.
IMPRESSION: Enlarging bilateral effusions and worsening bilateral airspace
disease, left worse than right.

## 2021-07-12 ENCOUNTER — Ambulatory Visit (HOSPITAL_COMMUNITY)
Admission: RE | Admit: 2021-07-12 | Discharge: 2021-07-12 | Disposition: A | Discharge status: Home / Self Care | Payer: Medicare HMO | Source: Ambulatory Visit | Attending: Diagnostic Radiology | Admitting: Diagnostic Radiology

## 2021-07-12 ENCOUNTER — Encounter (HOSPITAL_COMMUNITY): Payer: Self-pay

## 2021-07-12 ENCOUNTER — Other Ambulatory Visit: Payer: Self-pay

## 2021-07-12 ENCOUNTER — Ambulatory Visit (HOSPITAL_COMMUNITY)
Admission: RE | Admit: 2021-07-12 | Discharge: 2021-07-12 | Disposition: A | Discharge status: Home / Self Care | Payer: Medicare HMO | Source: Ambulatory Visit | Attending: Hematology | Admitting: Hematology

## 2021-07-12 DIAGNOSIS — C3490 Malignant neoplasm of unspecified part of unspecified bronchus or lung: Secondary | ICD-10-CM | POA: Insufficient documentation

## 2021-07-12 DIAGNOSIS — J910 Malignant pleural effusion: Secondary | ICD-10-CM | POA: Insufficient documentation

## 2021-07-12 MED ORDER — LIDOCAINE HCL (PF) 2 % IJ SOLN
INTRAMUSCULAR | Status: AC
  Filled 2021-07-12: qty 10

## 2021-07-12 MED ORDER — LIDOCAINE HCL (PF) 2 % IJ SOLN
10.0000 mL | Freq: Once | INTRAMUSCULAR | Status: AC
  Administered 2021-07-12: 10 mL

## 2021-07-12 NOTE — Procedures (Signed)
PreOperative Dx: Malignant RIGHT pleural effusion ?Postoperative Dx: Malignant RIGHT pleural effusion ?Procedure:   US guided RIGHT thoracentesis ?Radiologist:  Thornton Papas ?Anesthesia:  10 ml of 1% lidocaine ?Specimen:  1.7 L of dark old bloody colored fluid ?EBL:   < 1 ml ?Complications: None   ?

## 2021-07-12 NOTE — Progress Notes (Signed)
PT tolerated right sided thoracentesis procedure well today and 1.7 Liters of dark red fluid removed. Post chest xray completed and read by radiologist at that time. PT left via wheelchair with no acute distress noted and verbalized understanding of discharge instructions.  ?

## 2021-07-13 IMAGING — DX DG CHEST 1V PORT
1 series · 1 of 1 positions shown · non-contrast
Comparison: 04/28/2020

CLINICAL DATA: Shortness of breath

EXAM:
PORTABLE CHEST 1 VIEW

[chest ap]
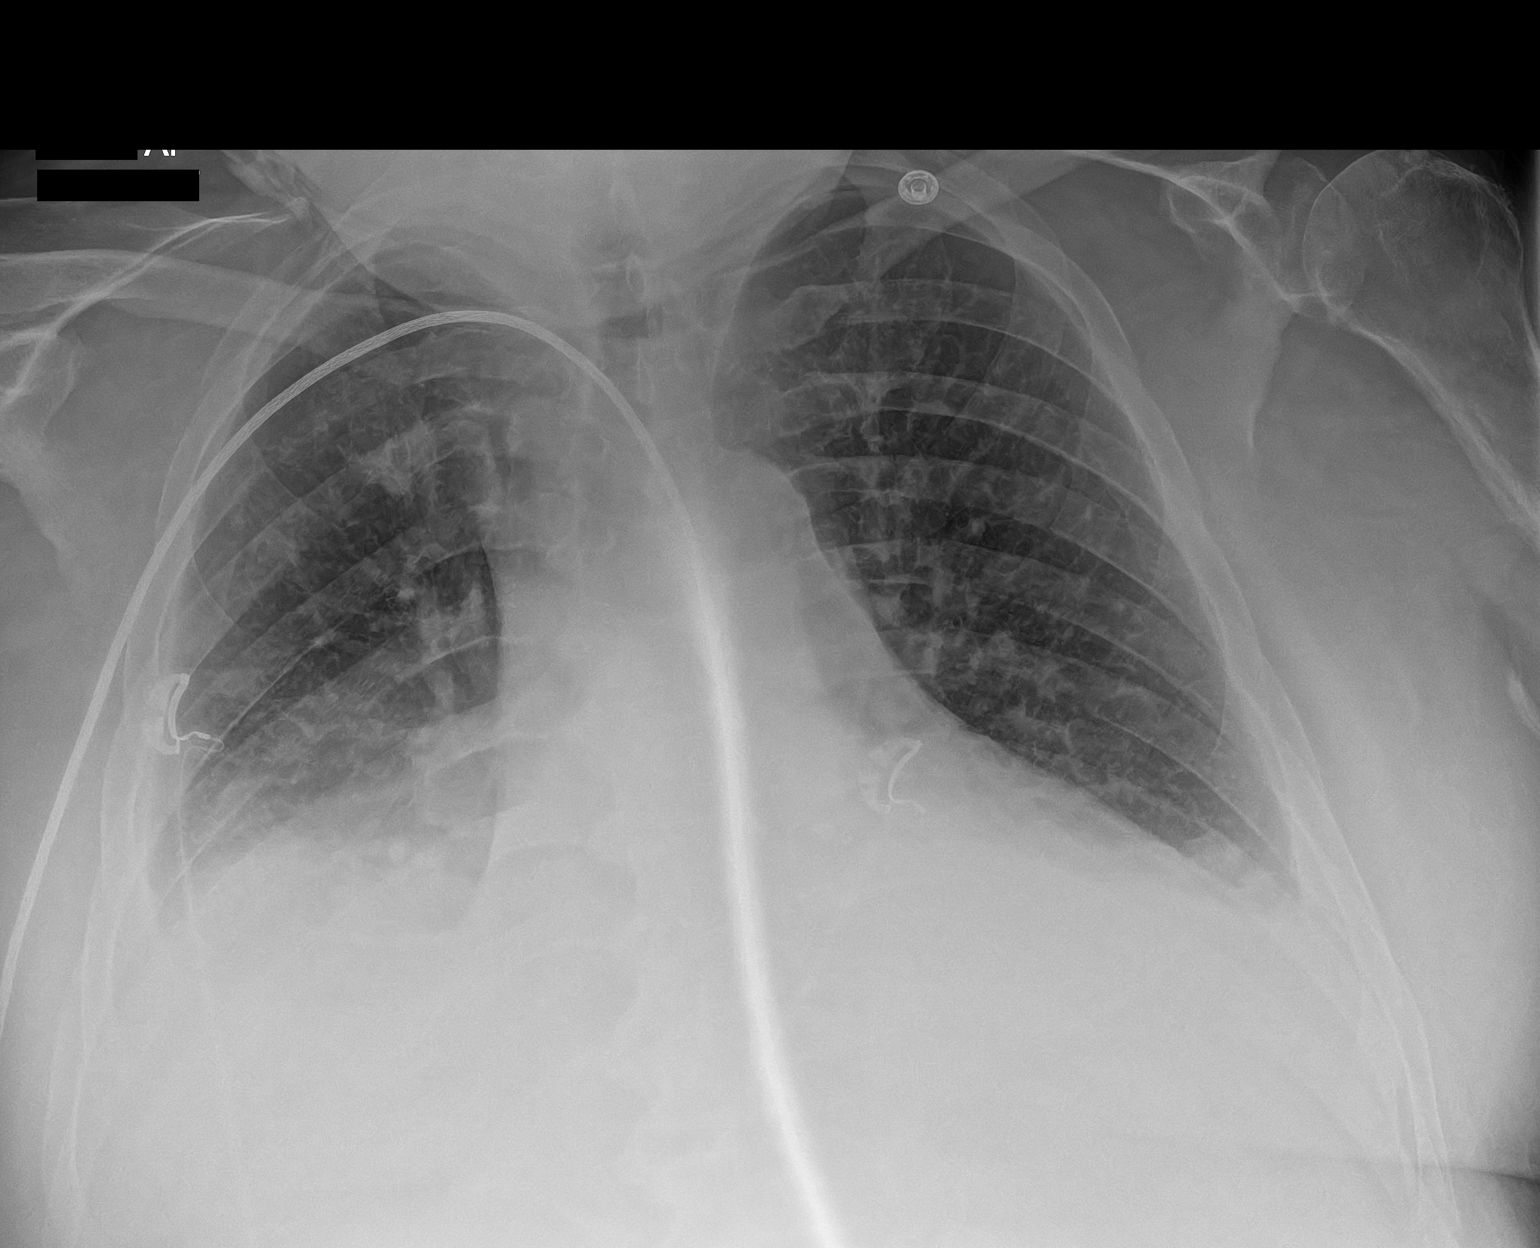

[1 of 1 positions shown; findings below may reference images not displayed]

FINDINGS: Small bilateral pleural effusions. Significantly improved aeration
of the left lung compared to prior. Patchy bibasilar airspace
opacities. Stable heart size. No pneumothorax.
IMPRESSION: 1. Significantly improved aeration of the left lung compared to
prior.
2. Small bilateral pleural effusions with patchy bibasilar airspace
opacities, atelectasis versus pneumonia.

## 2021-07-15 ENCOUNTER — Encounter (HOSPITAL_COMMUNITY)
Admission: RE | Admit: 2021-07-15 | Discharge: 2021-07-15 | Disposition: A | Discharge status: Home / Self Care | Payer: Medicare HMO | Source: Ambulatory Visit | Attending: Hematology | Admitting: Hematology

## 2021-07-15 DIAGNOSIS — C3490 Malignant neoplasm of unspecified part of unspecified bronchus or lung: Secondary | ICD-10-CM | POA: Insufficient documentation

## 2021-07-15 MED ORDER — FLUDEOXYGLUCOSE F - 18 (FDG) INJECTION
12.7300 | Freq: Once | INTRAVENOUS | Status: AC | PRN
  Administered 2021-07-15: 12.73 via INTRAVENOUS

## 2021-07-19 ENCOUNTER — Ambulatory Visit (HOSPITAL_COMMUNITY)
Admission: RE | Admit: 2021-07-19 | Discharge: 2021-07-19 | Disposition: A | Discharge status: Home / Self Care | Payer: Medicare HMO | Source: Ambulatory Visit | Attending: Hematology | Admitting: Hematology

## 2021-07-19 ENCOUNTER — Ambulatory Visit (HOSPITAL_COMMUNITY)
Admission: RE | Admit: 2021-07-19 | Discharge: 2021-07-19 | Disposition: A | Discharge status: Home / Self Care | Payer: Medicare HMO | Source: Ambulatory Visit | Attending: Diagnostic Radiology | Admitting: Diagnostic Radiology

## 2021-07-19 ENCOUNTER — Ambulatory Visit (HOSPITAL_COMMUNITY): Payer: Medicare HMO | Admitting: Hematology

## 2021-07-19 ENCOUNTER — Encounter (HOSPITAL_COMMUNITY): Payer: Self-pay

## 2021-07-19 DIAGNOSIS — C3490 Malignant neoplasm of unspecified part of unspecified bronchus or lung: Secondary | ICD-10-CM | POA: Diagnosis present

## 2021-07-19 MED ORDER — LIDOCAINE HCL (PF) 2 % IJ SOLN
10.0000 mL | Freq: Once | INTRAMUSCULAR | Status: AC
  Administered 2021-07-19: 10 mL

## 2021-07-19 NOTE — Progress Notes (Signed)
Patient tolerated right sided thoracentesis procedure well today and 1.4 Liters of red colored fluid removed. Pt taken to chest xray via wheelchair at this time and xray was read by radiologist at this time. PT departed via wheelchair with no acute distress noted and verbalized understanding of discharge instructions.  ?

## 2021-07-19 NOTE — Procedures (Signed)
PreOperative Dx: Malignant RIGHT pleural effusion ?Postoperative Dx: Malignant RIGHT pleural effusion ?Procedure:   US guided therapeutic  thoracentesis ?Radiologist:  Thornton Papas ?Anesthesia:  10 ml of 1% lidocaine ?Specimen:  1.4 L of dark sanguinous colored fluid ?EBL:   < 1 ml ?Complications: None   ?

## 2021-07-21 NOTE — Progress Notes (Addendum)
? ?Cardiology Office Note   ? ?Date:  07/27/2021  ? ?ID:  Thomas [name], DOB 09-19-1955, MRN [id number] ? ?PCP:  Leonie Douglas, MD  ?Cardiologist:  Rozann Lesches, MD  ?Electrophysiologist:  None  ? ?Chief Complaint: f/u CAD ? ?History of Present Illness:  ? ?Thomas [name] is a 66 y.o. male with history of CAD, ICM with normalized LVEF, recurrent pleural effusion s/p regular thoracenteses, non-small cell lung CA, COPD, chronic hypoxic respiratory failure on home O2, GERD, fatty liver, morbid obesity, arthritis who is seen for follow-up. Prior cath 2019 for mild NSTEMI showed primarily nonobstructive CAD as below except 90% OM2, treated medically. Echo at that time showed EF 20-25%, though he's had normalization since then with last echo 04/2020 showing EF 55-60%, normal RV, no significant valve disease. He was seen in the ED in March 2023 with chest pain felt noncardiac with negative troponins in the context of constipation. He reports significant improvement in all of his symptoms at that time after a large BM. 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 BNPs have been normal. Per oncology note 07/22/21, the patient has a persistent endobronchial lesion in the proximal right lower lobe bronchus with complete collapse of the right lower lobe. Dr. Delton Coombes recommended evaluation by Dr. Lamonte Sakai for bronchoscopy to further evaluate this endobronchial lesion. Dr. Delton Coombes also recommended PleurX catheter due to recurrent pleural effusion but the patient has refused. He continues to wear supplemental O2 with a baseline O2 sat of 88-93% per chart. ? ?He is seen back for follow-up otherwise relatively stable from cardiac standpoint. Aside from episode in March as above, he denies any interim cardiac changes. He is very sedentary. No exertional  angina. He has chronic unchanged dyspnea on exertion. His next thoracentesis is scheduled for this Friday. He does note he was previously on Entresto but that someone had told him remotely to stop. This was listed on his med list in 2021, but no longer on board during his cardiology visits in 08/2020 or 02/2021. It appears this was discontinued by pulmonology in 04/2020 after admission for respiratory failure as they were not aware at that he carried a history of systolic CHF. He also clarifies that he is taking Lasix and KCl once daily not twice daily and has been doing so for "years." ? ? ?Labwork independently reviewed: ?06/2021 CBC wnl, K 4.0, Cr 0.72, albumin 3.3, BNP wnl ?04/2020 TSH wnl ?2019 LDL 91 ? ?Cardiology Studies:  ? ?Studies reviewed are outlined and summarized above. Reports included below if pertinent.  ? ?2D echo 04/28/20 ? 1. Left ventricular ejection fraction, by estimation, is 55 to 60%. The  ?left ventricle has normal function. The left ventricle has no regional  ?wall motion abnormalities. Left ventricular diastolic parameters were  ?normal.  ? 2. Right ventricular systolic function is normal. The right ventricular  ?size is normal.  ? 3. The mitral valve is normal in structure. No evidence of mitral valve  ?regurgitation. No evidence of mitral stenosis.  ? 4. The aortic valve was not well visualized.  ? 5. The inferior vena cava is normal in size with greater than 50%  ?respiratory variability, suggesting right atrial pressure of 3 mmHg.  ? ?2D echo 01/30/20 ? 1. Very difficult visualization of the left ventricle, recommend  ?echocontrast for better evaluation. LVEF is probably  normal >50%. Left  ?ventricular ejection fraction, by estimation, is difficult to assess%. The  ?left ventricle has normal function. Left  ?ventricular endocardial border not optimally defined to evaluate regional  ?wall motion. There is mild left ventricular hypertrophy. Left ventricular  ?diastolic parameters are  indeterminate.  ? 2. Right ventricular systolic function is normal. The right ventricular  ?size is normal.  ? 3. The mitral valve was not well visualized. No evidence of mitral valve  ?regurgitation. No evidence of mitral stenosis.  ? 4. The aortic valve is tricuspid. Aortic valve regurgitation is not  ?visualized. No aortic stenosis is present.  ? ?Comparison(s): Previous Echo TDS. Visual EF estimated at 45%, moderate  ?hypokinesis of mid-apical anteroseptal wall.  ? ?Echo 2019 ?- Left ventricle: The cavity size was moderately to severely  ?  dilated. Systolic function was severely reduced. The estimated  ?  ejection fraction was in the range of 20% to 25%. Diffuse  ?  hypokinesis. There is severe hypokinesis of the anteroseptal and  ?  anterior myocardium. Indeterminate diastolic function.  ?- Aortic valve: Mildly to moderately calcified annulus. Trileaflet.  ?  Mean gradient (S): 5 mm Hg. Peak gradient (S): 9 mm Hg. Valve  ?  area (Vmean): 1.63 cm^2.  ?- Left atrium: The atrium was at the upper limits of normal in  ?  size.  ?- Right atrium: Central venous pressure (est): 8 mm Hg.  ?- Tricuspid valve: There was trivial regurgitation.  ?- Pulmonary arteries: Systolic pressure could not be accurately  ?  estimated.  ?- Pericardium, extracardiac: A prominent pericardial fat pad was  ?  present.  ? ?Cath 03/2018 ?Prox Cx lesion is 30% stenosed. ?Ost 2nd Mrg to 2nd Mrg lesion is 90% stenosed. ?2nd Mrg lesion is 90% stenosed. ?Prox LAD lesion is 30% stenosed. ?  ?Moderate global LV dysfunction with an ejection fraction of 35 to 40%.  LVEDP 13 mm. ?  ?There is evidence for coronary calcification.  The LAD has 30% proximal stenosis. ?  ?The Circumflex vessel has mild 30% narrowing prior to giving off 3 marginal vessels and the AV groove circumflex.  The second marginal branch has diffuse disease with 90% near ostial to proximal stenosis and diffuse 90% mid stenoses. ?  ?The RCA is a normal dominant vessel. ?   ?RECOMMENDATION: ?Due to the patient's large body habitus, visualization of vessels was difficult.  Recommend an initial aggressive medical therapy regimen with high potency statin therapy with target LDL less than 70, beta-blocker, amlodipine, in addition to nitrate therapy.  Since cardiac enzymes are mildly positive consistent with a NSTEMI recommend initial dual antiplatelet therapy with aspirin/Plavix even though stenting was not performed.  If patient fails medical therapy consider PCI to the diffusely diseased second marginal branch of the circumflex.  Smoking cessation is imperative. ? ?PET Scan 06/2021 ?IMPRESSION: ?1. Resolution of hypermetabolic activity in the RIGHT hilum/RIGHT ?lower lobe bronchus. ?2. While the metabolic activity is resolved, there is persistent ?endobronchial lesion in the proximal RIGHT lobe bronchus with ?complete collapse of the RIGHT lower lobe. ?3. No change in large layering RIGHT pleural effusion. ?4. small LEFT upper lobe pulmonary nodule unchanged in size and no ?metabolic activity. ?5. No distant metastatic disease.  ? ? ?Past Medical History:  ?Diagnosis Date  ? Arthritis   ? Bone spur   ? cervical - numbness left arm and hand  ? Cancer [facility])   ? COPD (chronic obstructive pulmonary disease) ([place])   ? Coronary  artery disease   ? Dyspnea   ? Oxygen 2.5L via [place] qhs and occasionally during the day prn  ? Fatty liver   ? GERD (gastroesophageal reflux disease)   ? Lung cancer ([place])   ? Morbid obesity ([place])   ? Neuromuscular disorder ([place])   ? NSTEMI (non-ST elevated myocardial infarction) ([place])   ? a. s/p cath in 03/2018 showing 90% OM2 stenosis and nonobstructive CAD along LAD and LCx with medical management recommended  ? Pneumonia   ? x2  ? Secondary cardiomyopathy ([place])   ? Snoring   ? Type 2 diabetes mellitus ([place])   ? ? ?Past Surgical History:  ?Procedure Laterality Date  ? BRONCHIAL BIOPSY  10/30/2020  ? Procedure: BRONCHIAL BIOPSIES;  Surgeon: Garner Nash, DO;   Location: [facility];  Service: Pulmonary;;  ? BRONCHIAL BRUSHINGS  10/30/2020  ? Procedure: BRONCHIAL BRUSHINGS;  Surgeon: Garner Nash, DO;  Location: [facility];  Service: Pulmonary;;  ? BRONCHIAL WASHINGS  10/30/2020  ? P

## 2021-07-22 ENCOUNTER — Encounter: Payer: Self-pay | Admitting: Physician Assistant

## 2021-07-22 ENCOUNTER — Inpatient Hospital Stay (HOSPITAL_COMMUNITY): Payer: Medicare HMO | Attending: Hematology | Admitting: Hematology

## 2021-07-22 VITALS — BP 113/63 | HR 92 | Temp 97.5°F | Resp 20 | Ht 69.49 in | Wt 258.2 lb

## 2021-07-22 DIAGNOSIS — M549 Dorsalgia, unspecified: Secondary | ICD-10-CM | POA: Diagnosis not present

## 2021-07-22 DIAGNOSIS — J910 Malignant pleural effusion: Secondary | ICD-10-CM | POA: Diagnosis not present

## 2021-07-22 DIAGNOSIS — C3431 Malignant neoplasm of lower lobe, right bronchus or lung: Secondary | ICD-10-CM | POA: Diagnosis present

## 2021-07-22 DIAGNOSIS — R0602 Shortness of breath: Secondary | ICD-10-CM | POA: Diagnosis not present

## 2021-07-22 DIAGNOSIS — R059 Cough, unspecified: Secondary | ICD-10-CM | POA: Diagnosis not present

## 2021-07-22 DIAGNOSIS — C3490 Malignant neoplasm of unspecified part of unspecified bronchus or lung: Secondary | ICD-10-CM | POA: Diagnosis not present

## 2021-07-22 DIAGNOSIS — R5383 Other fatigue: Secondary | ICD-10-CM | POA: Diagnosis not present

## 2021-07-22 NOTE — Patient Instructions (Signed)
[place] at [facility] ?Discharge Instructions ? ? ?You were seen and examined today by Dr. Delton Coombes. ? ?He reviewed the results of your PET scan.  It shows your lung has partially collapsed. Follow up on this with Dr. Melvyn Novas.  ? ?Return as scheduled in 6 months.  ? ? ?Thank you for choosing [place] at [facility] to provide your oncology and hematology care.  To afford each patient quality time with our provider, please arrive at least 15 minutes before your scheduled appointment time.  ? ?If you have a lab appointment with the [facility] please come in thru the Main Entrance and check in at the main information desk. ? ?You need to re-schedule your appointment should you arrive 10 or more minutes late.  We strive to give you quality time with our providers, and arriving late affects you and other patients whose appointments are after yours.  Also, if you no show three or more times for appointments you may be dismissed from the clinic at the providers discretion.     ?Again, thank you for choosing [facility].  Our hope is that these requests will decrease the amount of time that you wait before being seen by our physicians.       ?_____________________________________________________________ ? ?Should you have questions after your visit to [facility], please contact our office at [phone] and follow the prompts.  Our office hours are 8:00 a.m. and 4:30 p.m. Monday - Friday.  Please note that voicemails left after 4:00 p.m. may not be returned until the following business day.  We are closed weekends and major holidays.  You do have access to a nurse 24-7, just call the main number to the clinic [phone] and do not press any options, hold on the line and a nurse will answer the phone.   ? ?For prescription refill requests, have your pharmacy contact our office and allow 72 hours.   ? ?Due to Covid, you will need to wear a  mask upon entering the hospital. If you do not have a mask, a mask will be given to you at the Main Entrance upon arrival. For doctor visits, patients may have 1 support person age 5 or older with them. For treatment visits, patients can not have anyone with them due to social distancing guidelines and our immunocompromised population.  ? ?   ?

## 2021-07-22 NOTE — Progress Notes (Signed)
? ?[facility] ?[address] ?[place], [place] [address] ? ? ?CLINIC:  ?Medical Oncology/Hematology ? ?PCP:  ?Leonie Douglas, MD ?[address] [place] [address] ?[phone] ? ? 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 ?PRIOR THERAPY: Right thoracentesis on 12/08/2020 ? ?CURRENT THERAPY: surveillance ? ?BRIEF ONCOLOGIC HISTORY:  ?Oncology History  ?Non-small cell lung cancer ([place])  ?12/19/2018 Imaging  ? 1. Small to moderate size right-sided pleural effusion that appears to be at least partially loculated. ?2. Airspace consolidation involving the right middle lobe and right lower lobe concerning for multifocal pneumonia. Follow-up to resolution is recommended as an underlying mass cannot be excluded. ?3. There are few scattered 4 mm pulmonary nodules as detailed above. No follow-up needed if patient is low-risk (and has no known or suspected primary neoplasm). Non-contrast chest CT can be considered in 12 months if patient is high-risk.  ?4. Right-sided thyroid nodule as detailed above. This can be further evaluated with ultrasound as clinically indicated. ?5. Hepatic steatosis. ?6. Advanced coronary artery calcifications are noted. ?7. Dilated main pulmonary artery which can be seen in patients with elevated pulmonary artery pressures. ?  ?04/28/2020 Imaging  ? ECHO ? 1. Left ventricular ejection fraction, by estimation, is 55 to 60%. The left ventricle has normal function. The left ventricle has no regional wall motion abnormalities. Left ventricular diastolic parameters were normal.  ? 2. Right ventricular systolic function is normal. The right ventricular size is normal.  ? 3. The mitral valve is normal in structure. No evidence of mitral valve regurgitation. No evidence of mitral stenosis.  ? 4. The aortic valve was not well visualized.  ? 5. The inferior vena cava is normal in size with greater than 50% respiratory variability,  suggesting right atrial pressure of 3 mmHg.  ?  ?10/07/2020 Imaging  ? CT chest ?1. Lung-RADS 4B, suspicious. Additional imaging evaluation or consultation with Pulmonology or Thoracic Surgery recommended. Right middle and lower lobe collapse associated with moderate right pleural effusion. Occlusion of the bronchus intermedius evident. Central obstructing mass lesion a concern. CT chest with contrast recommended to further evaluate. These changes have been slowly progressive since 12/19/2018 and if neoplastic etiology has already been excluded, then given the degree of collapse/consolidation in the right lung, this patient is not a candidate for lung cancer screening. ?2. Enlargement of the pulmonary outflow tract and main pulmonary arteries suggests pulmonary arterial hypertension. ?3. Aortic Atherosclerosis (ICD10-I70.0). ?  ?10/30/2020 Pathology Results  ? Clinical History: Endobronchial lesion  ?Specimen Submitted:  B. LUNG, RLL SUPERIOR  , BRUSHING:  ? ? ?FINAL MICROSCOPIC DIAGNOSIS:  ?- Malignant cells consistent with squamous cell carcinoma  ?  ?10/30/2020 Pathology Results  ? FINAL MICROSCOPIC DIAGNOSIS:  ?- Malignant cells consistent with squamous cell carcinoma  ?  ?10/30/2020 Procedure  ? Pre-op Diagnosis: Collapsed RLL ?  ?Post-op Diagnosis: Endobronchial mass, RLL superior segment  ?  ?Surgeon: Octavio Graves Icard,DO  ?Operation: Flexible video fiberoptic bronchoscopy and biopsies. ?Indications and History: ?OLIS VIVERETTE is 66 y.o. with history of RLL collapse, and smoking.  Recommendation was to perform video fiberoptic bronchoscopy with biopsies. The risks, benefits, complications, treatment options and expected outcomes were discussed with the patient.  The possibilities of pneumothorax, pneumonia, reaction to medication, pulmonary aspiration, perforation of a viscus, bleeding, failure to diagnose a condition and creating a complication requiring transfusion or operation were discussed with the patient  who freely signed the  consent.   ?  ?Description of Procedure: ?The patient was seen in the Preoperative Area, was examined and was deemed appropriate to proceed.  The patient was taken to [facility] endoscopy room 2, identified as Tenna Delaine and the procedure verified as Flexible Video Fiberoptic Bronchoscopy.  A Time Out was held and the above information confirmed. ?  ?Therapeutic bronchoscope was inserted through the endotracheal tube and a general inspection was performed which showed normal cords, normal trachea, normal main carina. The R sided airways were inspected and showed normal RUL, BI, RML and the right lower lobe superior segment revealed a small endobronchial mass arising from the opening. The L side was then inspected. The LLL, Lingular and LUL airways were normal.  ?  ?Using the standard therapeutic bronchoscope, and 2.8 mm [employer] forceps biopsies were taken from the right lower lobe superior segment opening and visible endobronchial tumor.  We also completed cytology brushings to the right lower lobe superior segment as well as BAL specimen to be sent for cytology to the right lower lobe. ? ?Following biopsies we switched to the Olympus endobronchial ultrasound scope.  We were able to visualize the right lower lobe area of collapsed lung.  There was no clear identified mass under ultrasound.  I suspect that the lesion that we are seeing just arises from the opening of the right lower lobe superior segment.  Due to the area of atelectasis around the lesion was difficult to identify a discrete mass.  No samples were taken under ultrasound guidance. ?  ?Samples: ?1. Endobronchial brushings from right lower lobe ?2. Endobronchial forceps biopsies from right lower lobe ?3. Bronchial washings from right lower lobe ?  ?11/11/2020 Initial Diagnosis  ? Non-small cell lung cancer ([place]) ?  ?11/11/2020 Cancer Staging  ? Staging form: Lung, AJCC 8th Edition ?- Clinical: Stage IVA (cT4, cN0, cM1a) - Signed  by Heath Lark, MD on 11/11/2020 ? ?  ?11/25/2020 Genetic Testing  ? PD-L1 Results: ? ? ?  ? ? ?CANCER STAGING: ? Cancer Staging  ?Non-small cell lung cancer ([place]) ?Staging form: Lung, AJCC 8th Edition ?- Clinical: Stage IVA (cT4, cN0, cM1a) - Signed by Heath Lark, MD on 11/11/2020 ? ? ?INTERVAL HISTORY:  ?Mr. Tenna Delaine, a 66 y.o. 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 Dontae was last seen on 07/06/2021.  ? ?Today he reports feeling good.  ? ?REVIEW OF SYSTEMS:  ?Review of Systems  ?Constitutional:  Negative for appetite change and fatigue.  ?Respiratory:  Positive for cough and shortness of breath.   ?Gastrointestinal:  Positive for constipation.  ?Genitourinary:  Positive for frequency.   ?Musculoskeletal:  Positive for arthralgias (arm) and back pain.  ?All other systems reviewed and are negative. ? ?PAST MEDICAL/SURGICAL HISTORY:  ?Past Medical History:  ?Diagnosis Date  ? Arthritis   ? Bone spur   ? cervical - numbness left arm and hand  ? Cancer [facility])   ? CHF (congestive heart failure) ([place])   ? COPD (chronic obstructive pulmonary disease) ([place])   ? Coronary artery disease   ? Dyspnea   ? Oxygen 2.5L via [place] qhs and occasionally during the day prn  ? Fatty liver   ? GERD (gastroesophageal reflux disease)   ? Morbid obesity ([place])   ? Neuromuscular disorder ([place])   ? NSTEMI (non-ST elevated myocardial infarction) ([place])   ? a. s/p cath in 03/2018 showing 90% OM2 stenosis and nonobstructive CAD along  LAD and LCx with medical management recommended  ? Pneumonia   ? x2  ? Secondary cardiomyopathy ([place])   ? a. EF reported as 20-25% by echo in 03/2018 b. EF at 45% by repeat imaging in 10/2018  ? Snoring   ? Type 2 diabetes mellitus ([place])   ? ?Past Surgical History:  ?Procedure Laterality Date  ? BRONCHIAL BIOPSY  10/30/2020  ? Procedure: BRONCHIAL BIOPSIES;  Surgeon: Garner Nash, DO;  Location: [facility];  Service: Pulmonary;;  ? BRONCHIAL  BRUSHINGS  10/30/2020  ? Procedure: BRONCHIAL BRUSHINGS;  Surgeon: Garner Nash, DO;  Location: [facility];  Service: Pulmonary;;  ? BRONCHIAL WASHINGS  10/30/2020  ? Procedure: BRONCHIAL WASHINGS;

## 2021-07-26 ENCOUNTER — Encounter (HOSPITAL_COMMUNITY): Payer: Self-pay

## 2021-07-26 ENCOUNTER — Ambulatory Visit (HOSPITAL_COMMUNITY)
Admission: RE | Admit: 2021-07-26 | Discharge: 2021-07-26 | Disposition: A | Discharge status: Home / Self Care | Payer: Medicare HMO | Source: Ambulatory Visit | Attending: Hematology | Admitting: Hematology

## 2021-07-26 ENCOUNTER — Other Ambulatory Visit (HOSPITAL_COMMUNITY): Payer: Self-pay

## 2021-07-26 ENCOUNTER — Ambulatory Visit (HOSPITAL_COMMUNITY)
Admission: RE | Admit: 2021-07-26 | Discharge: 2021-07-26 | Disposition: A | Discharge status: Home / Self Care | Payer: Medicare HMO | Source: Ambulatory Visit | Attending: Diagnostic Radiology | Admitting: Diagnostic Radiology

## 2021-07-26 DIAGNOSIS — C3490 Malignant neoplasm of unspecified part of unspecified bronchus or lung: Secondary | ICD-10-CM | POA: Insufficient documentation

## 2021-07-26 DIAGNOSIS — J910 Malignant pleural effusion: Secondary | ICD-10-CM | POA: Diagnosis present

## 2021-07-26 NOTE — Procedures (Signed)
PreOperative Dx: Julien Girt right pleural effusion ?Postoperative Dx: Malignant right pleural effusion ?Procedure:   US guided right thoracentesis ?Radiologist:  Thornton Papas ?Anesthesia:  10 ml of 1% lidocaine ?Specimen:  1.5 L of dark amber colored fluid ?EBL:   < 1 ml ?Complications: none   ?

## 2021-07-26 NOTE — Progress Notes (Signed)
RIGHT POSTERIOR THORAX PREPPED WITH STERILE TECHNIQUE. LIDOCAINE ADMINISTERED. CATHETER INTRODUCED THROUGH SKIN CLEAR SEROSANGUINOUS FLUID OBTAINED UNDER VACUUM. O2 SATS REMAIN >90, WITH 2L [place] IN PLACE. BP=117/56. 1500CC REMOVED, TOLERATED WELL. SUCTION CLAMPED, ACCESS WITHDRAWN. CLOSED WITH DERMABOND, GAUZE COVERING IN PLACE.  ?

## 2021-07-26 NOTE — Progress Notes (Signed)
Standing orders placed for US Thoracentesis per Dr. Delton Coombes. Patient aware ?

## 2021-07-27 ENCOUNTER — Ambulatory Visit (INDEPENDENT_AMBULATORY_CARE_PROVIDER_SITE_OTHER): Payer: Medicare HMO | Admitting: Physician Assistant

## 2021-07-27 ENCOUNTER — Encounter: Payer: Self-pay | Admitting: Physician Assistant

## 2021-07-27 VITALS — BP 130/52 | HR 96 | Ht 69.0 in | Wt 257.0 lb

## 2021-07-27 DIAGNOSIS — J9611 Chronic respiratory failure with hypoxia: Secondary | ICD-10-CM | POA: Diagnosis not present

## 2021-07-27 DIAGNOSIS — J90 Pleural effusion, not elsewhere classified: Secondary | ICD-10-CM | POA: Diagnosis not present

## 2021-07-27 DIAGNOSIS — I2510 Atherosclerotic heart disease of native coronary artery without angina pectoris: Secondary | ICD-10-CM

## 2021-07-27 DIAGNOSIS — I255 Ischemic cardiomyopathy: Secondary | ICD-10-CM | POA: Diagnosis not present

## 2021-07-27 MED ORDER — POTASSIUM CHLORIDE ER 10 MEQ PO TBCR: 10.0000 meq | EXTENDED_RELEASE_TABLET | Freq: Every day | ORAL | 3 refills | Status: AC

## 2021-07-27 NOTE — Patient Instructions (Addendum)
Follow-Up: ?Follow up with Dr. McDowell in 6 months.  ? ?Any Other Special Instructions Will Be Listed Below (If Applicable). ? ? ? ? ?If you need a refill on your cardiac medications before your next appointment, please call your pharmacy. ? ?

## 2021-07-30 ENCOUNTER — Ambulatory Visit (HOSPITAL_COMMUNITY)
Admission: RE | Admit: 2021-07-30 | Discharge: 2021-07-30 | Disposition: A | Discharge status: Home / Self Care | Payer: Medicare HMO | Source: Ambulatory Visit | Attending: Hematology | Admitting: Hematology

## 2021-07-30 ENCOUNTER — Ambulatory Visit (HOSPITAL_COMMUNITY)
Admission: RE | Admit: 2021-07-30 | Discharge: 2021-07-30 | Disposition: A | Discharge status: Home / Self Care | Payer: Medicare HMO | Source: Ambulatory Visit | Attending: Diagnostic Radiology | Admitting: Diagnostic Radiology

## 2021-07-30 ENCOUNTER — Ambulatory Visit (HOSPITAL_COMMUNITY): Payer: Medicare HMO

## 2021-07-30 ENCOUNTER — Telehealth: Payer: Self-pay | Admitting: Internal Medicine

## 2021-07-30 ENCOUNTER — Other Ambulatory Visit (HOSPITAL_COMMUNITY): Payer: Self-pay

## 2021-07-30 DIAGNOSIS — C3490 Malignant neoplasm of unspecified part of unspecified bronchus or lung: Secondary | ICD-10-CM | POA: Insufficient documentation

## 2021-07-30 DIAGNOSIS — J910 Malignant pleural effusion: Secondary | ICD-10-CM | POA: Diagnosis present

## 2021-07-30 MED ORDER — LIDOCAINE HCL (PF) 2 % IJ SOLN
INTRAMUSCULAR | Status: AC
  Filled 2021-07-30: qty 10

## 2021-07-30 NOTE — Telephone Encounter (Signed)
Will need ov either in [place] or [place] to sort this out  - the other option is second opinior by Dr Windell Norfolk since he's the one who would be doing the bronchoscopy anyway and probably can get the pt in faster than I can so that's what I would prefer ?

## 2021-07-30 NOTE — Progress Notes (Signed)
Patient called to request having a pleurx catheter placed, order placed per Dr. Delton Coombes. ?

## 2021-07-30 NOTE — Procedures (Signed)
PreOperative Dx: Malignant RIGHT pleural effusion ?Postoperative Dx: Malignant RIGHT  pleural effusion ?Procedure:   US guided RIGHT thoracentesis ?Radiologist:  Thornton Papas ?Anesthesia:  10 ml of 1% lidocaine ?Specimen:  1.1 L of dark amber colored fluid ?EBL:   < 1 ml ?Complications: None   ?

## 2021-07-30 NOTE — Telephone Encounter (Signed)
Called and spoke with patient. He stated that he was seen by his cancer doctor Dr.Katragadda for a thoracentesis. Per patient, Dr. Delton Coombes told him to call Dr. Melvyn Novas and ask for a bronchoscopy since the fluid keeps coming back around the right lung. He stated that he felt fine at the time of call and that he is only SOB when the fluid has returned. He was not in any distress while on the phone.  ? ?I tried to see if I could find the latest note from Dr. Delton Coombes. He was seen on 07/22/21 and I found this in the AVS:  ? ?You were seen and examined today by Dr. Delton Coombes. ?  ?He reviewed the results of your PET scan.  It shows your lung has partially collapsed. Follow up on this with Dr. Melvyn Novas.  ?  ?Return as scheduled in 6 months.  ? ?Dr. Melvyn Novas, can you please advise? Thanks.  ?

## 2021-07-30 NOTE — Progress Notes (Signed)
Patient tolerated right sided thoracentesis procedure well today and 1100 ml of reddish brown colored fluid removed.  ?

## 2021-07-30 NOTE — Telephone Encounter (Signed)
Called and spoke with patient. He verbalized understanding. He is aware that I will send a message to Dr. Valeta Harms to see if he would be ok with seeing him to discuss a possible bronchoscopy. I looked for a few dates that he and his wife could make arrangements to come to [facility] 08/09/21 at 1145am would for them but it is a 15 min slot.  ? ?Dr. Valeta Harms, can you please advise? Thanks!   ?

## 2021-08-02 ENCOUNTER — Telehealth: Payer: Self-pay | Admitting: Internal Medicine

## 2021-08-02 ENCOUNTER — Telehealth: Payer: Self-pay | Admitting: Physician Assistant

## 2021-08-02 DIAGNOSIS — I255 Ischemic cardiomyopathy: Secondary | ICD-10-CM

## 2021-08-02 MED ORDER — FUROSEMIDE 20 MG PO TABS: 20.0000 mg | ORAL_TABLET | Freq: Every day | ORAL | 11 refills | Status: AC

## 2021-08-02 NOTE — Telephone Encounter (Signed)
? ? ?  Followup to recent OV. Discussed medications with Dr. Domenic Polite. He would recommend obtaining a f/u echocardiogram to assess EF before revisiting whether to restart ARB/Entresto. Can you please let patient know and arrange limited echo to assess EF? In the meantime would update Lasix rx on file to reflect that patient is only taking this once daily. Thanks! ?Dehlia Kilner ?

## 2021-08-02 NOTE — Telephone Encounter (Signed)
Called the pt and I tried going over the preferred dates with him but he kept stating he could not hear me and that his phone was breaking up. Will try back tomorrow.  ?

## 2021-08-02 NOTE — Telephone Encounter (Signed)
Pt has not been scheduled for bronchoscopy yet.  There is another open message dated 4/14 that has been routed to [facility] with dates pt states he can do procedure and they are all in May not sure where date 4/24 cam from.  Will route this message to [facility] as well??  ?

## 2021-08-02 NOTE — Telephone Encounter (Signed)
Pt informed of the need to have echo done. Pt voiced understanding. Lasix Rx updated and sent to pharmacy.  ?

## 2021-08-02 NOTE — Telephone Encounter (Signed)
Garner Nash, DO ?to Valerie Salts, CMA  [facility] Triage Pool   ?   8:22 AM ?  ?Cherina, Please let patient know that if he is ok with scheduling the bronchoscopy I will just meet him at the hospital and same him a trip to [employer]. I did his first bronchoscopy several months ago.  ? ?I can do his procedure this Friday AM possibly at [facility] if the patient is ok with that plan  ? ?Thanks  ? ?BLI  ? ?I called and spoke with the pt and notified of response per Dr Valeta Harms. He states okay to scheduled bronch but the only days he can do are May 2, 4, 5, 8, 9, 10 or 12th.  ?

## 2021-08-03 NOTE — Telephone Encounter (Signed)
ATC patient. VM is full  ?Thomas [name] are you able to address with patient? I am unsure how bronchoscopy procedure works with scheduling and do not want to cause anymore confusion for the patient?  ?

## 2021-08-04 ENCOUNTER — Ambulatory Visit (HOSPITAL_COMMUNITY)
Admission: RE | Admit: 2021-08-04 | Discharge: 2021-08-04 | Disposition: A | Discharge status: Home / Self Care | Payer: Medicare HMO | Source: Ambulatory Visit | Attending: Physician Assistant | Admitting: Physician Assistant

## 2021-08-04 ENCOUNTER — Ambulatory Visit (HOSPITAL_COMMUNITY)
Admission: RE | Admit: 2021-08-04 | Discharge: 2021-08-04 | Disposition: A | Discharge status: Home / Self Care | Payer: Medicare HMO | Source: Ambulatory Visit | Attending: Hematology | Admitting: Hematology

## 2021-08-04 ENCOUNTER — Encounter (HOSPITAL_COMMUNITY): Payer: Self-pay

## 2021-08-04 DIAGNOSIS — Z9889 Other specified postprocedural states: Secondary | ICD-10-CM | POA: Insufficient documentation

## 2021-08-04 DIAGNOSIS — C3490 Malignant neoplasm of unspecified part of unspecified bronchus or lung: Secondary | ICD-10-CM | POA: Insufficient documentation

## 2021-08-04 MED ORDER — LIDOCAINE HCL (PF) 2 % IJ SOLN
10.0000 mL | Freq: Once | INTRAMUSCULAR | Status: AC
  Administered 2021-08-04: 10 mL

## 2021-08-04 MED ORDER — LIDOCAINE HCL (PF) 2 % IJ SOLN
INTRAMUSCULAR | Status: AC
Start: 2021-08-04 — End: 2021-08-04
  Filled 2021-08-04: qty 10

## 2021-08-04 NOTE — Telephone Encounter (Signed)
ATC patient and the line rang and then I got a loud fax beeping noise ?

## 2021-08-04 NOTE — Telephone Encounter (Signed)
Thomas [name], were you able to get back in touch with this man and get a date? ?

## 2021-08-04 NOTE — Progress Notes (Signed)
Pt tolerated right sided thoracentesis procedure well today and 1.1 Liters of clear amber colored fluid removed. PT verbalized understanding of discharge instructions and went to xray for a post procedure chest xray at this time with no acute distress noted.  ?

## 2021-08-04 NOTE — Procedures (Signed)
PROCEDURE SUMMARY: ? ?Successful US guided right thoracentesis. ?Yielded 1.1L of clear amber fluid. ?Pt tolerated procedure well. ?No immediate complications. ? ?Specimen not sent for labs. ?CXR ordered. ? ?EBL < 5 mL ? ?Paislie Tessler PA-C ?08/04/2021 ?1:31 PM ? ? ? ?

## 2021-08-05 ENCOUNTER — Other Ambulatory Visit: Payer: Self-pay | Admitting: Radiology

## 2021-08-05 NOTE — Telephone Encounter (Signed)
There is ongoing open message from 4/14.  BI has asked for all messages to be on that one note.  Will close this message. ?

## 2021-08-06 ENCOUNTER — Ambulatory Visit (HOSPITAL_COMMUNITY)
Admission: RE | Admit: 2021-08-06 | Discharge: 2021-08-06 | Disposition: A | Discharge status: Home / Self Care | Payer: Medicare HMO | Source: Ambulatory Visit | Attending: Interventional Radiology | Admitting: Interventional Radiology

## 2021-08-06 ENCOUNTER — Encounter (HOSPITAL_COMMUNITY): Payer: Self-pay

## 2021-08-06 ENCOUNTER — Ambulatory Visit (HOSPITAL_COMMUNITY)
Admission: RE | Admit: 2021-08-06 | Discharge: 2021-08-06 | Disposition: A | Discharge status: Home / Self Care | Payer: Medicare HMO | Source: Ambulatory Visit | Attending: Hematology | Admitting: Hematology

## 2021-08-06 ENCOUNTER — Other Ambulatory Visit: Payer: Self-pay

## 2021-08-06 DIAGNOSIS — C3491 Malignant neoplasm of unspecified part of right bronchus or lung: Secondary | ICD-10-CM | POA: Diagnosis not present

## 2021-08-06 DIAGNOSIS — F17200 Nicotine dependence, unspecified, uncomplicated: Secondary | ICD-10-CM | POA: Diagnosis not present

## 2021-08-06 DIAGNOSIS — E119 Type 2 diabetes mellitus without complications: Secondary | ICD-10-CM | POA: Insufficient documentation

## 2021-08-06 DIAGNOSIS — I2510 Atherosclerotic heart disease of native coronary artery without angina pectoris: Secondary | ICD-10-CM | POA: Diagnosis not present

## 2021-08-06 DIAGNOSIS — J910 Malignant pleural effusion: Secondary | ICD-10-CM | POA: Insufficient documentation

## 2021-08-06 DIAGNOSIS — J449 Chronic obstructive pulmonary disease, unspecified: Secondary | ICD-10-CM | POA: Diagnosis not present

## 2021-08-06 DIAGNOSIS — C3490 Malignant neoplasm of unspecified part of unspecified bronchus or lung: Secondary | ICD-10-CM

## 2021-08-06 HISTORY — PX: IR GUIDED DRAIN W CATHETER PLACEMENT: IMG719

## 2021-08-06 HISTORY — PX: IR PERC PLEURAL DRAIN W/INDWELL CATH W/IMG GUIDE: IMG5383

## 2021-08-06 LAB — CBC WITH DIFFERENTIAL/PLATELET
Abs Immature Granulocytes: 0.04 10*3/uL (ref 0.00–0.07)
Basophils Absolute: 0 10*3/uL (ref 0.0–0.1)
Basophils Relative: 0 %
Eosinophils Absolute: 0.1 10*3/uL (ref 0.0–0.5)
Eosinophils Relative: 2 %
HCT: 48.2 % (ref 39.0–52.0)
Hemoglobin: 15.1 g/dL (ref 13.0–17.0)
Immature Granulocytes: 1 %
Lymphocytes Relative: 7 %
Lymphs Abs: 0.4 10*3/uL — ABNORMAL LOW (ref 0.7–4.0)
MCH: 29.7 pg (ref 26.0–34.0)
MCHC: 31.3 g/dL (ref 30.0–36.0)
MCV: 94.9 fL (ref 80.0–100.0)
Monocytes Absolute: 0.5 10*3/uL (ref 0.1–1.0)
Monocytes Relative: 8 %
Neutro Abs: 5 10*3/uL (ref 1.7–7.7)
Neutrophils Relative %: 82 %
Platelets: 202 10*3/uL (ref 150–400)
RBC: 5.08 MIL/uL (ref 4.22–5.81)
RDW: 14 % (ref 11.5–15.5)
WBC: 6 10*3/uL (ref 4.0–10.5)
nRBC: 0 % (ref 0.0–0.2)

## 2021-08-06 LAB — BASIC METABOLIC PANEL
Anion gap: 9 (ref 5–15)
BUN: 22 mg/dL (ref 8–23)
CO2: 33 mmol/L — ABNORMAL HIGH (ref 22–32)
Calcium: 9.2 mg/dL (ref 8.9–10.3)
Chloride: 100 mmol/L (ref 98–111)
Creatinine, Ser: 0.47 mg/dL — ABNORMAL LOW (ref 0.61–1.24)
GFR, Estimated: >60 mL/min (ref >60)
Glucose, Bld: 140 mg/dL — ABNORMAL HIGH (ref 70–99)
Potassium: 4.5 mmol/L (ref 3.5–5.1)
Sodium: 142 mmol/L (ref 135–145)

## 2021-08-06 LAB — GLUCOSE, CAPILLARY: Glucose-Capillary: 129 mg/dL — ABNORMAL HIGH (ref 70–99)

## 2021-08-06 LAB — PROTIME-INR
INR: 1 (ref 0.8–1.2)
Prothrombin Time: 12.8 seconds (ref 11.4–15.2)

## 2021-08-06 MED ORDER — CEFAZOLIN SODIUM-DEXTROSE 2-4 GM/100ML-% IV SOLN: 2.0000 g | INTRAVENOUS | Status: AC

## 2021-08-06 MED ORDER — DIPHENHYDRAMINE HCL 50 MG/ML IJ SOLN
INTRAMUSCULAR | Status: DC
Start: 2021-08-06 — End: 2021-08-06
  Filled 2021-08-06: qty 1

## 2021-08-06 MED ORDER — DIPHENHYDRAMINE HCL 50 MG/ML IJ SOLN
INTRAMUSCULAR | Status: AC | PRN
  Administered 2021-08-06: 25 mg via INTRAVENOUS

## 2021-08-06 MED ORDER — SODIUM CHLORIDE 0.9 % IV SOLN: INTRAVENOUS | Status: DC

## 2021-08-06 MED ORDER — LIDOCAINE-EPINEPHRINE 1 %-1:100000 IJ SOLN
INTRAMUSCULAR | Status: AC
  Filled 2021-08-06: qty 1

## 2021-08-06 MED ORDER — MIDAZOLAM HCL 2 MG/2ML IJ SOLN
INTRAMUSCULAR | Status: AC
  Filled 2021-08-06: qty 4

## 2021-08-06 MED ORDER — CEFAZOLIN SODIUM-DEXTROSE 2-4 GM/100ML-% IV SOLN
INTRAVENOUS | Status: AC
  Administered 2021-08-06: 2 g via INTRAVENOUS
  Filled 2021-08-06: qty 100

## 2021-08-06 MED ORDER — FENTANYL CITRATE (PF) 100 MCG/2ML IJ SOLN
INTRAMUSCULAR | Status: AC
  Filled 2021-08-06: qty 2

## 2021-08-06 MED ORDER — FENTANYL CITRATE (PF) 100 MCG/2ML IJ SOLN
INTRAMUSCULAR | Status: AC | PRN
  Administered 2021-08-06 (×2): 50 ug via INTRAVENOUS

## 2021-08-06 MED ORDER — MIDAZOLAM HCL 2 MG/2ML IJ SOLN
INTRAMUSCULAR | Status: AC | PRN
  Administered 2021-08-06 (×3): 1 mg via INTRAVENOUS

## 2021-08-06 NOTE — Discharge Instructions (Signed)
For questions /concerns may call Interventional Radiology at [phone] ? ?You may remove your dressing and shower tomorrow afternoon ? ?Moderate Conscious Sedation, Adult, Care After ?This sheet gives you information about how to care for yourself after your procedure. Your health care provider may also give you more specific instructions. If you have problems or questions, contact your health careprovider. ?What can I expect after the procedure? ?After the procedure, it is common to have: ?Sleepiness for several hours. ?Impaired judgment for several hours. ?Difficulty with balance. ?Vomiting if you eat too soon. ?Follow these instructions at home: ?For the time period you were told by your health care provider: ?Rest. ?Do not participate in activities where you could fall or become injured. ?Do not drive or use machinery. ?Do not drink alcohol. ?Do not take sleeping pills or medicines that cause drowsiness. ?Do not make important decisions or sign legal documents. ?Do not take care of children on your own. ?Eating and drinking ? ?Follow the diet recommended by your health care provider. ?Drink enough fluid to keep your urine pale yellow. ?If you vomit: ?Drink water, juice, or soup when you can drink without vomiting. ?Make sure you have little or no nausea before eating solid foods. ? ?General instructions ?Take over-the-counter and prescription medicines only as told by your health care provider. ?Have a responsible adult stay with you for the time you are told. It is important to have someone help care for you until you are awake and alert. ?Do not smoke. ?Keep all follow-up visits as told by your health care provider. This is important. ?Contact a health care provider if: ?You are still sleepy or having trouble with balance after 24 hours. ?You feel light-headed. ?You keep feeling nauseous or you keep vomiting. ?You develop a rash. ?You have a fever. ?You have redness or swelling around the IV site. ?Get help  right away if: ?You have trouble breathing. ?You have new-onset confusion at home. ?Summary ?After the procedure, it is common to feel sleepy, have impaired judgment, or feel nauseous if you eat too soon. ?Rest after you get home. Know the things you should not do after the procedure. ?Follow the diet recommended by your health care provider and drink enough fluid to keep your urine pale yellow. ?Get help right away if you have trouble breathing or new-onset confusion at home. ?This information is not intended to replace advice given to you by your health care provider. Make sure you discuss any questions you have with your healthcare provider. ?Document Revised: 08/02/2019 Document Reviewed: 02/28/2019 ?Elsevier Patient Education ? [place].   ? ? ?Pleurex Catheter ? ?Indwelling Pleural Catheter Home Guide (Pleurex Drain) ? ?An indwelling pleural catheter is a thin, flexible tube that is inserted under your skin and into your chest. The catheter drains excess fluid that collects in the area between the chest wall and the lungs (pleural space). After the catheter is inserted, it can be attached to a bottle that collects fluid. ?The pleural catheter will allow you to drain fluid from your chest at home on a regular basis (sometimes daily). This will eliminate the need for frequent visits to the hospital or clinic to drain the fluid. The catheter may be removed after the excess fluid problem is resolved, usually after 2-3 months. It is important to follow instructions from your health care provider about how to drain and care for your catheter. ?What are the risks? ?Generally, this is a safe procedure. However, problems may occur, including: ?  Infection. ?Skin damage around the catheter. ?Lung damage. ?Failure of the chest tube to work properly. ?Spreading of cancer cells along the catheter, if you have cancer. ?Supplies needed: ?Vacuum-sealed drainage bottle with attached drainage line. ?Sterile  dressing. ?Sterile alcohol pads. ?Sterile gloves. ?Valve cap. ?Sterile gauze pads, 4 ? 4 inch (10 cm ? 10 cm). ?Tape. ?Adhesive dressing. ?Sterile foam catheter pad. ?How to care for your catheter and insertion site ?Wash your hands with soap and warm water before and after touching the catheter or insertion site. If soap and water are not available, use hand sanitizer. ?Check your bandage (dressing) daily to make sure it is clean and dry. ?Keep the skin around the catheter clean and dry. ?Check the catheter regularly for any cracks or kinks in the tubing. ?Check your catheter insertion site every day for signs of infection. Check for: ?Skin breakdown. ?Redness, swelling, or pain. ?Fluid or blood. ?Warmth. ?Pus or a bad smell. ?How to drain your catheter ?You may need to drain your catheter every day, or more or less often as told by your health care provider. Follow instructions from your health care provider about how to drain your catheter. You may also refer to instructions that come with the drainage system. To drain the catheter: ?Wash your hands with soap and warm water. If soap and water are not available, use hand sanitizer. ?Carefully remove the dressing from around the catheter. ?Wash your hands again. ?Put on the gloves provided. ?Prepare the vacuum-sealed drainage bottle and drainage line. Close the drainage line of the vacuum-sealed drainage bottle by squeezing the pinch clamp or rolling the wheel of the roller clamp toward the bottle. The vacuum in the bottle will be lost if the line is not closed completely. ?Remove the access tip cover from the drainage line. Do not touch the end. Set it on a sterile surface. ?Remove the catheter valve cap and throw it away. ?Use an alcohol pad to clean the end of the catheter. ?Insert the access tip into the catheter valve. Make sure the valve and access tip are securely connected. Listen for a click to confirm that they are connected. ?Insert the T plunger to  break the vacuum seal on the drainage bottle. ?Open the clamp on the drainage line. ?Allow the catheter to drain. Keep the catheter and the drainage bottle below the level of your chest. There may be a one-way valve on the end of the tubing that will allow liquid and air to flow out of the catheter without letting air inside. ?Drain the amount of fluid as told by your health care provider. It usually takes 5-15 minutes. Do not drain more than 1000 mL of fluid. You may feel a little discomfort while you are draining. If the pain is severe, stop draining and contact your health care provider. ?After you finish draining the catheter, remove the drainage bottle tubing from the catheter. ?Use a clean alcohol pad to wipe the catheter tip. ?Place a clean cap on the end of the catheter. ?Use an alcohol pad to clean the skin around the catheter. ?Allow the skin to air-dry. ?Put the catheter pad on your skin. Curl the catheter into loops and place it on the pad. Do not place the catheter on your skin. ?Replace the dressing over the catheter. ?Discard the drainage bottle as instructed by your health care provider. Do not reuse the drainage bottle. ?How to change your dressing ?Change your dressing at least once a week, or more often  if needed to keep the dressing dry. Be sure to change the dressing whenever it becomes moist. Your health care provider will tell you how often to change your dressing. ?Wash your hands with soap and warm water. If soap and water are not available, use hand sanitizer. ?Gently remove the old dressing. Avoid using scissors to remove the dressing. Sharp objects may damage the catheter. ?Wash the skin around the insertion site with mild, fragrance-free soap and warm water. Rinse well, then pat the area dry with a clean cloth. ?Check the skin around the catheter for signs of infection. Check for: ?Skin breakdown. ?Redness, swelling, or pain. ?Fluid or blood. ?Warmth. ?Pus or a bad smell. ?If your  catheter was stitched (sutured) to your skin, look at the suture to make sure it is still anchored in your skin. ?Do not apply creams, ointments, or alcohol to the area. Let your skin air-dry completely before you apply a

## 2021-08-06 NOTE — Procedures (Signed)
Pre procedural Dx: Recurrent malignant pleural effusion ?Post procedural Dx: Same ? ?Technically successful image guided placement of a tunneled right pleural drain. ?800 cc of brown colored serous fluid aspirated following drain placement.  ? ?EBL: Trace ?Complications: None immediate ? ?Ronny Bacon, MD ?Pager #: [phone] ? ? ?

## 2021-08-06 NOTE — Sedation Documentation (Signed)
Pt dropped sats into low 70% ST rate 132.  pt pale and dusky and coughing. pt placed on 100% non re-breather and chin lift performed sats improved rapidly to 94%. Observed pt for 10 min and pt placed on 4L [place] and transferred to short stay.

## 2021-08-06 NOTE — Progress Notes (Addendum)
Yorkana Rowe Robert- patient is requesting to leave earlier if possible; has to pick up their child from school in [place]. States he mentioned to Dr. Pascal Lux prior to procedure. ?PA Allred stated he can leave at 1330 if stable . ? ?1330 VSS and O2 sat is 93%.  Pt. Denies pain or SOB.   Reviewed all post IR instructions with wife and pt. In room.   Provided Youtube video for Pleurx drain.  Informed patinet and wife of Order for [place] nurse to assist with pleurx drain. ?

## 2021-08-06 NOTE — H&P (Signed)
? ? ?Referring Physician(s): ?Katragadda,Sreedhar ? ?Supervising Physician: Sandi Mariscal ? ?Patient Status:  [facility] OP ? ?Chief Complaint: ? ?"I'm getting a tube in my chest" ? ?Subjective: ?Patient known to IR service from multiple right thoracenteses, latest on 08/04/2021 yielding 1 L of fluid.  He is a smoker with history of squamous cell carcinoma of the right lung diagnosed in 2022 now with recurrent symptomatic right pleural effusions.  He is scheduled today for right Pleurx catheter placement.  Past medical history also significant for arthritis, COPD, coronary artery disease with prior MI, fatty liver, GERD, obesity, secondary cardiomyopathy, and diabetes. He denies fever,HA,chest pain, abd pain,N/V or bleeding. He does have dyspnea, occ cough, back pain, constipation. ? ? ? ? ?Past Medical History:  ?Diagnosis Date  ? Arthritis   ? Bone spur   ? cervical - numbness left arm and hand  ? Cancer [facility])   ? COPD (chronic obstructive pulmonary disease) ([place])   ? Coronary artery disease   ? Dyspnea   ? Oxygen 2.5L via [place] qhs and occasionally during the day prn  ? Fatty liver   ? GERD (gastroesophageal reflux disease)   ? Lung cancer ([place])   ? Morbid obesity ([place])   ? Neuromuscular disorder ([place])   ? NSTEMI (non-ST elevated myocardial infarction) ([place])   ? a. s/p cath in 03/2018 showing 90% OM2 stenosis and nonobstructive CAD along LAD and LCx with medical management recommended  ? Pneumonia   ? x2  ? Secondary cardiomyopathy ([place])   ? Snoring   ? Type 2 diabetes mellitus ([place])   ? ?Past Surgical History:  ?Procedure Laterality Date  ? BRONCHIAL BIOPSY  10/30/2020  ? Procedure: BRONCHIAL BIOPSIES;  Surgeon: Garner Nash, DO;  Location: [facility];  Service: Pulmonary;;  ? BRONCHIAL BRUSHINGS  10/30/2020  ? Procedure: BRONCHIAL BRUSHINGS;  Surgeon: Garner Nash, DO;  Location: [facility];  Service: Pulmonary;;  ? BRONCHIAL WASHINGS  10/30/2020  ? Procedure: BRONCHIAL WASHINGS;  Surgeon: Garner Nash, DO;   Location: [facility];  Service: Pulmonary;;  ? CHOLECYSTECTOMY    ? COLONOSCOPY    ? ENDOBRONCHIAL ULTRASOUND  10/30/2020  ? Procedure: ENDOBRONCHIAL ULTRASOUND;  Surgeon: Garner Nash, DO;  Location: [facility];  Service: Pulmonary;;  ? LEFT HEART CATH AND CORONARY ANGIOGRAPHY N/A 03/30/2018  ? Procedure: LEFT HEART CATH AND CORONARY ANGIOGRAPHY;  Surgeon: Troy Sine, MD;  Location: [facility];  Service: Cardiovascular;  Laterality: N/A;  ? SHOULDER ARTHROSCOPY Bilateral   ? TONSILLECTOMY    ? UPPER GI ENDOSCOPY    ? VIDEO BRONCHOSCOPY Right 10/30/2020  ? Procedure: VIDEO BRONCHOSCOPY WITHOUT FLUORO;  Surgeon: Garner Nash, DO;  Location: [facility];  Service: Pulmonary;  Laterality: Right;  possible cryotherapy  ? WISDOM TOOTH EXTRACTION    ? ? ? ? ?Allergies: ?Glipizide, Codeine, and Doxycycline ? ?Medications: ?Prior to Admission medications   ?Medication Sig Start Date End Date Taking? Authorizing Provider  ?albuterol (PROVENTIL) (2.5 MG/3ML) 0.083% nebulizer solution Take 3 mLs (2.5 mg total) by nebulization every 6 (six) hours as needed for wheezing or shortness of breath. 10/15/20   Garner Nash, DO  ?albuterol (VENTOLIN HFA) 108 (90 Base) MCG/ACT inhaler Inhale 2 puffs into the lungs every 6 (six) hours as needed for wheezing or shortness of breath.    [provider]  ?aspirin EC 81 MG tablet Take 81 mg by mouth daily.    [provider]  ?atorvastatin (LIPITOR) 80 MG  tablet TAKE ONE TABLET BY MOUTH ONCE DAILY AT 6 PM. ?Patient taking differently: Take 80 mg by mouth daily. 04/01/21   Satira Sark, MD  ?bismuth subsalicylate (PEPTO BISMOL) 262 MG/15ML suspension Take 30 mLs by mouth every 6 (six) hours as needed.    [provider]  ?canagliflozin (INVOKANA) 300 MG TABS tablet Take 300 mg by mouth daily before breakfast.    [provider]  ?carvedilol (COREG) 12.5 MG tablet TAKE ONE TABLET BY MOUTH TWICE DAILY 07/01/21   Satira Sark, MD  ?clopidogrel (PLAVIX) 75 MG tablet TAKE ONE TABLET BY MOUTH DAILY WITH BREAKFAST ?Patient taking differently: Take 75 mg by mouth daily. 04/01/21   Satira Sark, MD  ?docusate sodium (COLACE) 100 MG capsule Take 200 mg by mouth daily as needed for mild constipation.    [provider]  ?furosemide (LASIX) 20 MG tablet Take 1 tablet (20 mg total) by mouth daily. 08/02/21   Dunn, Nedra Hai, PA-C  ?ibuprofen (ADVIL) 200 MG tablet Take 600 mg by mouth 2 (two) times daily.    [provider]  ?isosorbide mononitrate (IMDUR) 60 MG 24 hr tablet TAKE ONE TABLET BY MOUTH EVERY DAY ?Patient taking differently: Take 60 mg by mouth daily. 04/01/21   Satira Sark, MD  ?ketoconazole (NIZORAL) 2 % cream Apply 1 application topically daily as needed for irritation. 05/15/20   [provider]  ?metFORMIN (GLUCOPHAGE-XR) 500 MG 24 hr tablet Take 1,000 mg by mouth 2 (two) times daily. 11/25/18   [provider]  ?NEOMYCIN-POLYMYXIN-HYDROCORTISONE (CORTISPORIN) 1 % SOLN OTIC solution Place 3 drops into both ears as needed. 10/09/20   [provider]  ?nitroGLYCERIN (NITROSTAT) 0.4 MG SL tablet DISSOLVE ONE TABLET UNDER TONGUE EVERY 5 MINUTES UP TO 3 DOSES AS NEEDED FOR CHEST PAIN ?Patient taking differently: Place 0.4 mg under the tongue every 5 (five) minutes as needed for chest pain. 06/07/21   Satira Sark, MD  ?omeprazole (PRILOSEC) 40 MG capsule Take 40 mg by mouth daily.    [provider]  ?polyethylene glycol (MIRALAX / GLYCOLAX) 17 g packet Take 17 g by mouth daily as needed for moderate constipation. 07/07/21   Davonna Belling, MD  ?potassium chloride (KLOR-CON) 10 MEQ tablet Take 1 tablet (10 mEq total) by mouth daily. 07/27/21   Dunn, Nedra Hai, PA-C  ?sertraline (ZOLOFT) 25 MG tablet Take 1 tablet by mouth daily. 06/07/21   [provider]  ?sodium chloride (OCEAN) 0.65 % nasal spray Place 1 spray into the nose as needed for congestion.     [provider]  ? ? ? ?Vital Signs:pending ? ? ? ?Physical Exam awake/alert; chest- decreased BS bilat but primarily rt base; heart- RRR; abd- obese, soft,+BS, NT; no sig LE edema ? ?Imaging: ?DG Chest 1 View ? ?Result Date: 08/04/2021 ?CLINICAL DATA:  Status post right thoracentesis. EXAM: CHEST  1 VIEW COMPARISON:  July 30, 2021. FINDINGS: Stable cardiomediastinal silhouette. No pneumothorax is noted. Stable mild to moderate right pleural effusion is noted with associated right basilar atelectasis or infiltrate. Bony thorax is unremarkable. IMPRESSION: No pneumothorax status post right thoracentesis. Stable right-sided pleural effusion is noted with associated atelectasis or infiltrate. Electronically Signed   By: Marijo Conception M.D.   On: 08/04/2021 10:57  ? ?US THORACENTESIS ASP PLEURAL SPACE W/IMG GUIDE ? ?Result Date: 08/04/2021 ?INDICATION: Recurrent pleural effusion, NSCLC EXAM: ULTRASOUND GUIDED RIGHT THORACENTESIS MEDICATIONS: None. COMPLICATIONS: None immediate. PROCEDURE: An ultrasound guided thoracentesis was  thoroughly discussed with the patient and questions answered. The benefits, risks, alternatives and complications were also discussed. The patient understands and wishes to proceed with the procedure. Written consent was obtained. Ultrasound was performed to localize and mark an adequate pocket of fluid in the right chest. The area was then prepped and draped in the normal sterile fashion. 1% Lidocaine was used for local anesthesia. Under ultrasound guidance a 6 Fr Safe-T-Centesis catheter was introduced. Thoracentesis was performed. The catheter was removed and a dressing applied. FINDINGS: A total of approximately 1.1L  of clear amber fluid was removed. IMPRESSION: Successful ultrasound guided right thoracentesis yielding 1.1 L of pleural fluid. Performed and dictated by Pasty Spillers, PA-C Electronically Signed   By: Marijo Conception M.D.   On: 08/04/2021 12:58    ? ?Labs: ? ?CBC: ?Recent Labs  ?  10/30/20 ?0602 06/18/21 ?1505 06/22/21 ?4081 07/07/21 ?1523  ?WBC 6.9 4.5 4.9 6.3  ?HGB 16.5 14.0 13.6 13.8  ?HCT 55.0* 45.5 42.8 43.3  ?PLT 224 154 195 243  ? ? ?COAGS: ?Recent Labs  ?  06/18/21

## 2021-08-09 ENCOUNTER — Other Ambulatory Visit (HOSPITAL_COMMUNITY): Payer: Self-pay | Admitting: Hematology

## 2021-08-09 ENCOUNTER — Other Ambulatory Visit: Payer: Self-pay | Admitting: Physician Assistant

## 2021-08-09 ENCOUNTER — Encounter (HOSPITAL_COMMUNITY): Payer: Self-pay | Admitting: *Deleted

## 2021-08-09 ENCOUNTER — Telehealth: Payer: Self-pay | Admitting: Student

## 2021-08-09 ENCOUNTER — Other Ambulatory Visit (HOSPITAL_COMMUNITY): Payer: Self-pay | Admitting: *Deleted

## 2021-08-09 ENCOUNTER — Telehealth (HOSPITAL_COMMUNITY): Payer: Self-pay | Admitting: *Deleted

## 2021-08-09 ENCOUNTER — Encounter (HOSPITAL_COMMUNITY): Payer: Self-pay | Admitting: Radiology

## 2021-08-09 ENCOUNTER — Other Ambulatory Visit (HOSPITAL_COMMUNITY): Payer: Self-pay | Admitting: Sports Medicine

## 2021-08-09 DIAGNOSIS — C3431 Malignant neoplasm of lower lobe, right bronchus or lung: Secondary | ICD-10-CM | POA: Diagnosis not present

## 2021-08-09 DIAGNOSIS — C3490 Malignant neoplasm of unspecified part of unspecified bronchus or lung: Secondary | ICD-10-CM

## 2021-08-09 NOTE — Telephone Encounter (Signed)
Order faxed to [facility] on 4/18 regarding care for pleurex at home.  Received successful transmission record.  Spoke to patient that day, who stated he had spoken to them as well.  Contact number [phone].   ?

## 2021-08-09 NOTE — Progress Notes (Signed)
Patient had Pleurex placed at [facility] on 4/21.  He is receiving supplies from [facility] and is in need of teaching of care for catheter.  Currently, he is a palliative patient with [facility], who advised that they do not care for pleurex catheters.  Spoke with Dr. Thornton Papas in IR at [facility], who has agreed to drain and teach patient catheter care.  Patient scheduled for 4/25 @ 10 am.  Referral has been made to [facility], as they can support his palliative needs, as well as care for his pleurex, in the event he should not be able to do this for himself.  Patient is aware and agreeable to plan. ?

## 2021-08-09 NOTE — Telephone Encounter (Signed)
PCCM: ? ?We were able to get a hold of him this morning.  But unless we have 5 full days off of his Plavix we will not be able to do the procedure on 08/12/2021.  Due to the inability to get a hold of him last week this procedure will likely need to be pushed out. ? ?Can we confirm whether or not the patient is taking his Plavix.  The MAR in epic says that he is marked as "taking it differently". ? ?If he is not taking Plavix then we will be able to do his case on 08/12/2021. ? ?If not we will need to use one of the dates listed below.  Next option would be May 11 at 7:30 AM? ? ?Thanks ? ?Garner Nash, DO ? Pulmonary Critical Care ?08/09/2021 10:07 AM   ? ?

## 2021-08-09 NOTE — Telephone Encounter (Signed)
Called pt again, he is still on plavix. Advised of other dates for May. He did not seem sure about any of the dates and said he would need to check with his spouse. I scheduled him for telephone visit with Dr Valeta Harms for 08/16/21 at 10 am to discuss this further to cut out all of the back and forth via telephone msgs.  ?

## 2021-08-09 NOTE — Telephone Encounter (Signed)
Patient with right Pleurx catheter placed 08/06/21. Patient called IR today stating he was told someone would be coming to his house to show him how to use the drain. He states home health has not been arranged. Patient encouraged to call his Oncologist, Dr. Delton Coombes, to get this set up. I also called Dr. Delton Coombes today and discussed this with him. He stated he would have home health arranged.  ? Soyla Dryer, AGACNP-BC ?[phone] ?08/09/2021, 1:51 PM ? ? ?

## 2021-08-09 NOTE — Telephone Encounter (Signed)
I was able to reach the pt this morning and he says he can do 08/12/21 ?Sending back to procedure pool to arrange, thanks ?

## 2021-08-10 ENCOUNTER — Ambulatory Visit (HOSPITAL_COMMUNITY): Admission: RE | Admit: 2021-08-10 | Payer: Medicare HMO | Source: Ambulatory Visit | Admitting: Hematology

## 2021-08-10 NOTE — Progress Notes (Signed)
PT and wife verbalized understanding of teaching about how to care for pleurex catheter and drain at home. 1000 mL of pleural (right) fluid removed during demonstration (teaching) by this nurse and Dr. Thornton Papas with on acute distress noted. PT brought in supplies that he was given by IR post pleurex placement last week and was shown how to drain his pleurex with the assistance of his wife. New dressing applied and pt ambulatory at discharge with no acute distress noted. Printed instructions for care of pleurex given to patient and wife at departure.  ?

## 2021-08-11 ENCOUNTER — Institutional Professional Consult (permissible substitution): Payer: Medicare HMO | Admitting: Pulmonary Disease

## 2021-08-12 ENCOUNTER — Encounter (HOSPITAL_COMMUNITY): Payer: Self-pay

## 2021-08-12 ENCOUNTER — Ambulatory Visit (HOSPITAL_COMMUNITY): Admit: 2021-08-12 | Payer: Medicare HMO | Admitting: Pulmonary Disease

## 2021-08-12 SURGERY — VIDEO BRONCHOSCOPY WITHOUT FLUORO
Anesthesia: General

## 2021-08-16 ENCOUNTER — Encounter: Payer: Self-pay | Admitting: Pulmonary Disease

## 2021-08-16 ENCOUNTER — Ambulatory Visit (INDEPENDENT_AMBULATORY_CARE_PROVIDER_SITE_OTHER): Payer: Medicare HMO | Admitting: Pulmonary Disease

## 2021-08-16 DIAGNOSIS — C3491 Malignant neoplasm of unspecified part of right bronchus or lung: Secondary | ICD-10-CM | POA: Diagnosis not present

## 2021-08-16 NOTE — Progress Notes (Signed)
Virtual Visit via Telephone Note ? ?I connected with Thomas [name] on 08/16/21 at 10:00 AM EDT by telephone and verified that I am speaking with the correct person using two identifiers. ? ?Location: ?Patient: Home ?Provider: Office ?  ?I discussed the limitations, risks, security and privacy concerns of performing an evaluation and management service by telephone and the availability of in person appointments. I also discussed with the patient that there may be a patient responsible charge related to this service. The patient expressed understanding and agreed to proceed. ? ? ?History of Present Illness: ? ?This is a 66 year old gentleman past medical history of congestive heart failure, COPD, NSTEMI, prior cardiomyopathy, repeat echocardiogram in 2020 with a EF of 45%.  He is morbidly obese.  He recently had a lung cancer screening CT which showed right lower lobe collapse and possible endobronchial mass.  He has been a longstanding smoker.  He has smoked since age 38 years old.  He has had a persistent right-sided chest wheeze.  He was admitted back in January with pneumonia.  Chest x-ray at the time also showed right lower lobe partial collapse.  Now the whole lower lobe is collapsed.   ?  ?OV 08/16/2021: Telephone visit today.  Seen initially back in June 2022 diagnosed with lung cancer.  Undergoing treatments with Dr. Delton Coombes.  Had a recent PET scan in March showing a right-sided pleural effusion.  He underwent a Pleurx catheter placement by interventional radiology.  He has tumor in the lower lobe of his airway that is occluding his bronchus.  Patient was referred back to me by Dr. Melvyn Novas for consideration of tumor debulking to see if we can alleviate some of his dyspnea and shortness of breath related to endobronchial tumor and right lower lobe collapse. ? ?  ?Observations/Objective: ?Able to speak in complete sentences sound to be no distress on the phone ? ?Assessment and Plan: ? ?Right-sided endobronchial  tumor, history of non-small cell lung cancer ?Pleural effusion status post indwelling pleural catheter placement ? ?Plan: ?We discussed the risk benefits and alternatives of proceeding with bronchoscopy. ?He is on Plavix.  Last dose of Plavix will need to be 08/24/2021. ?Tentative bronchoscopy date with cryotherapy and tumor debulking on 08/30/2021. ? ?Appreciate the PCC's help with scheduling of bronchoscopy. ?Orders placed ? ?Follow Up Instructions: ? ?  ?I discussed the assessment and treatment plan with the patient. The patient was provided an opportunity to ask questions and all were answered. The patient agreed with the plan and demonstrated an understanding of the instructions. ?  ?The patient was advised to call back or seek an in-person evaluation if the symptoms worsen or if the condition fails to improve as anticipated. ? ?I provided 22 minutes of non-face-to-face time during this encounter. ? ? ?Garner Nash, DO ? ?

## 2021-08-19 ENCOUNTER — Ambulatory Visit (HOSPITAL_COMMUNITY)
Admission: RE | Admit: 2021-08-19 | Discharge: 2021-08-19 | Disposition: A | Discharge status: Home / Self Care | Payer: Medicare HMO | Source: Ambulatory Visit | Attending: Physician Assistant | Admitting: Physician Assistant

## 2021-08-19 DIAGNOSIS — I255 Ischemic cardiomyopathy: Secondary | ICD-10-CM | POA: Insufficient documentation

## 2021-08-19 LAB — ECHOCARDIOGRAM LIMITED
Calc EF: 56.9 %
S' Lateral: 4.3 cm
Single Plane A2C EF: 63.1 %
Single Plane A4C EF: 51.3 %

## 2021-08-19 NOTE — Progress Notes (Signed)
*  PRELIMINARY RESULTS* ?Echocardiogram ?Limited 2D Echocardiogram has been performed. ? ?Thomas [name] ?08/19/2021, 12:17 PM ?

## 2021-08-27 ENCOUNTER — Encounter (HOSPITAL_COMMUNITY): Payer: Self-pay | Admitting: Pulmonary Disease

## 2021-08-27 NOTE — Anesthesia Preprocedure Evaluation (Addendum)
Anesthesia Evaluation  ?Patient identified by MRN, date of birth, ID band ?Patient awake ? ? ? ?Reviewed: ?Allergy & Precautions, NPO status , Patient's Chart, lab work & pertinent test results ? ?Airway ?Mallampati: II ? ?TM Distance: >3 FB ?Neck ROM: Full ? ? ? Dental ?no notable dental hx. ? ?  ?Pulmonary ?shortness of breath, COPD, Current Smoker and Patient abstained from smoking.,  ?  ?Pulmonary exam normal ?breath sounds clear to auscultation ? ? ? ? ? ? Cardiovascular ?hypertension, + CAD, + Past MI and +CHF  ?Normal cardiovascular exam ?Rhythm:Regular Rate:Normal ? ? ?  ?Neuro/Psych ?negative neurological ROS ? negative psych ROS  ? GI/Hepatic ?Neg liver ROS, GERD  ,  ?Endo/Other  ?negative endocrine ROSdiabetes, Type 2 ? Renal/GU ?negative Renal ROS  ?negative genitourinary ?  ?Musculoskeletal ? ?(+) Arthritis , Osteoarthritis,   ? Abdominal ?(+) + obese,   ?Peds ?negative pediatric ROS ?(+)  Hematology ?negative hematology ROS ?(+)   ?Anesthesia Other Findings ? ? Reproductive/Obstetrics ?negative OB ROS ? ?  ? ? ? ? ? ? ? ? ? ? ? ? ? ?  ?  ? ? ? ? ? ? ? ?Anesthesia Physical ?Anesthesia Plan ? ?ASA: 3 ? ?Anesthesia Plan: General  ? ?Post-op Pain Management: Dilaudid IV and Minimal or no pain anticipated  ? ?Induction: Intravenous ? ?PONV Risk Score and Plan: 1 and Ondansetron and Treatment may vary due to age or medical condition ? ?Airway Management Planned: Oral ETT ? ?Additional Equipment:  ? ?Intra-op Plan:  ? ?Post-operative Plan: Extubation in OR ? ?Informed Consent: I have reviewed the patients History and Physical, chart, labs and discussed the procedure including the risks, benefits and alternatives for the proposed anesthesia with the patient or authorized representative who has indicated his/her understanding and acceptance.  ? ? ? ?Dental advisory given ? ?Plan Discussed with: CRNA ? ?Anesthesia Plan Comments: (PAT note written 08/27/2021 by Myra Gianotti,  PA-C. ?)  ? ? ? ? ? ?Anesthesia Quick Evaluation ? ?

## 2021-08-27 NOTE — Progress Notes (Signed)
Anesthesia Chart Review:SAME DAY WORK-UP ? Case: [id number] Date/Time: 08/30/21 0900  ? Procedure: VIDEO BRONCHOSCOPY WITHOUT FLUORO - cryotherapy and tumor debulking  ? Anesthesia type: Monitor Anesthesia Care  ? 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 Pre-op diagnosis: endobronchial cancer  ? [facility] / [facility]  ? Surgeons: Garner Nash, DO  ? ?  ? ? ?DISCUSSION: Patient is a 66 year old male scheduled for the above procedure. Known stage IV lung cancer. Per 08/16/21 note by Dr. Valeta Harms, "Had a recent PET scan in March showing a right-sided pleural effusion.  He underwent a Pleurx catheter placement by interventional radiology.  He has tumor in the lower lobe of his airway that is occluding his bronchus.  Patient was referred back to me by Dr. Melvyn Novas for consideration of tumor debulking to see if we can alleviate some of his dyspnea and shortness of breath related to endobronchial tumor and right lower lobe collapse." ? 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 ? ?Last cardiology visit 07/27/21 with Melina Copa, PA-C. He had pending pulmonology follow-up for consideration of bronchoscopy for endobrochial lesion and for insertion of a Pleurex catheter. He was felt overall stable from a cardiac standpoint. Limited echo ordered per discussion with Dr. Domenic Polite before revisiting whether to restart ARB/Entresto. 08/19/21 echo showed normal  LVEF 55-60%.  ? ?Last Plavix 08/23/21 per patient. Per Dr. Valeta Harms, he can continue ASA perioperatively. ? ?He is a same day work-up.  Anesthesia team to evaluate on the day of surgery.  Last labs 08/06/2021. ? ? ?VS:  ?BP Readings from Last 3 Encounters:  ?08/06/21 (!) 146/73  ?08/04/21 108/62  ?07/30/21 107/62  ? ?Pulse Readings from Last 3 Encounters:  ?08/06/21 (!) 102  ?08/04/21 86  ?07/30/21 89  ?  ? ?PROVIDERS: ?Leonie Douglas, MD is PCP  ?Rozann Lesches, MD is cardiologist ?Derek Jack, MD is HEM-ONC ?Christinia Gully, MD is pulmonologist ? ? ?LABS: Last lab results in [facility] include: ?Lab Results  ?Component Value Date  ? WBC 6.0 08/06/2021  ? HGB 15.1 08/06/2021  ? HCT 48.2 08/06/2021  ? PLT 202 08/06/2021  ? GLUCOSE 140 (H) 08/06/2021  ? ALT 13 06/22/2021  ? AST 13 (L) 06/22/2021  ? NA 142 08/06/2021  ? K 4.5 08/06/2021  ? CL 100 08/06/2021  ? CREATININE 0.47 (L) 08/06/2021  ? BUN 22 08/06/2021  ? CO2 33 (H) 08/06/2021  ? TSH 0.481 04/28/2020  ? INR 1.0 08/06/2021  ? HGBA1C 8.8 (H) 04/27/2020  ? ? ?PFTs 06/21/21: FVC 1.48 (33%), post 1.61 (36%). FEV1 0.83 (24%), post 0.93 (27%). DLCO unc 9.57 (36%), cor 9.74 (37%). ? ? ?IMAGES: ?1V PCXR 08/06/21: ?IMPRESSION: ?1. Interval placement of right-sided pleural catheter with interval ?reduction/resolution of right-sided effusion with development of a ?right basilar ex vacuo pneumothorax. ?2. Minimally improved aeration of the lungs with findings of ?pulmonary venous congestion without frank evidence of edema. ?  ?PET Scan 07/15/21: ?IMPRESSION: ?1. Resolution of hypermetabolic activity in the RIGHT hilum/RIGHT ?lower lobe bronchus. ?2. While the metabolic activity is resolved, there  is persistent ?endobronchial lesion in the proximal RIGHT lobe bronchus with ?complete collapse of the RIGHT lower lobe. ?3. No change in large layering RIGHT pleural effusion. ?4. small LEFT upper lobe pulmonary nodule unchanged in size and no ?metabolic activity. ?5. No distant  metastatic disease. ? ?  ? ?EKG: 07/07/21: ?Sinus rhythm ?Right axis deviation ?Septal infarct , age undetermined ?Abnormal ECG ?When compared with ECG of 22-Jun-2021 06:46, ?PREVIOUS ECG IS PRESENT ?pvc and rate increased ?Confirmed by Davonna Belling [phone]) on 07/07/2021 3:27:10 PM ? ? ?CV: ?Echo (Limited) 08/19/2021: ?IMPRESSIONS  ? 1. Left ventricular ejection fraction, by estimation, is 55 to 60%. The  ?left ventricle has normal function. Left ventricular endocardial border  ?not optimally defined to evaluate regional wall motion. The left  ?ventricular internal cavity size was mildly  ?dilated. There is mild left ventricular hypertrophy.  ? 2. Right ventricular systolic function is normal. The right ventricular  ?size is normal.  ? 3. The aortic valve was not well visualized.  ? 4. The inferior vena cava is normal in size with greater than 50%  ?respiratory variability, suggesting right atrial pressure of 3 mmHg.  ? 5. Limited echo  ? ? ?TTE 04/28/2020: ? 1. Left ventricular ejection fraction, by estimation, is 55 to 60%. The  ?left ventricle has normal function. The left ventricle has no regional  ?wall motion abnormalities. Left ventricular diastolic parameters were  ?normal.  ? 2. Right ventricular systolic function is normal. The right ventricular  ?size is normal.  ? 3. The mitral valve is normal in structure. No evidence of mitral valve  ?regurgitation. No evidence of mitral stenosis.  ? 4. The aortic valve was not well visualized.  ? 5. The inferior vena cava is normal in size with greater than 50%  ?respiratory variability, suggesting right atrial pressure of 3 mmHg.  ?  ? ?Cath 03/30/2018: ?Moderate global LV dysfunction with an ejection fraction of 35 to 40%.  LVEDP 13 mm. ?  ?There is evidence for coronary calcification.  The LAD has 30% proximal stenosis. ?  ?The Circumflex vessel has mild 30% narrowing prior to giving off 3 marginal vessels and the AV groove circumflex.  The second marginal branch  has diffuse disease with 90% near ostial to proximal stenosis and diffuse 90% mid stenoses. ?  ?The RCA is a normal dominant vessel. ?  ?RECOMMENDATION: ?Due to the patient's large body habitus, visualization of vessels was difficult. Recommend an initial aggressive medical therapy regimen with high potency statin therapy with target LDL less than 70, beta-blocker, amlodipine, in addition to nitrate therapy.  Since cardiac enzymes are mildly positive consistent with a NSTEMI recommend initial dual antiplatelet therapy with aspirin/Plavix even though stenting was not performed.  If patient fails medical therapy consider PCI to the diffusely diseased second marginal branch of the circumflex.  Smoking cessation is imperative. ?  ? ?Past Medical History:  ?Diagnosis Date  ? Arthritis   ? Bone spur   ? cervical - numbness left arm and hand  ? Cancer [facility])   ? CHF (congestive heart failure) ([place])   ? COPD (chronic obstructive pulmonary disease) ([place])   ? Coronary artery disease   ? Dyspnea   ? Oxygen 2.5L via [place] qhs and occasionally during the day prn  ? Fatty liver   ? GERD (gastroesophageal reflux disease)   ? Lung cancer ([place])   ? Morbid obesity ([place])   ? Neuromuscular disorder ([place])   ? NSTEMI (non-ST elevated myocardial infarction) (  [place])   ? a. s/p cath in 03/2018 showing 90% OM2 stenosis and nonobstructive CAD along LAD and LCx with medical management recommended  ? Pneumonia   ? x2  ? Secondary cardiomyopathy ([place])   ? Snoring   ? Type 2 diabetes mellitus ([place])   ? ? ?Past Surgical History:  ?Procedure Laterality Date  ? BRONCHIAL BIOPSY  10/30/2020  ? Procedure: BRONCHIAL BIOPSIES;  Surgeon: Garner Nash, DO;  Location: [facility] ENDOSCOPY;  Service: Pulmonary;;  ? BRONCHIAL BRUSHINGS  10/30/2020  ? Procedure: BRONCHIAL BRUSHINGS;  Surgeon: Garner Nash, DO;  Location: [facility] ENDOSCOPY;  Service: Pulmonary;;  ? BRONCHIAL WASHINGS  10/30/2020  ? Procedure: BRONCHIAL WASHINGS;  Surgeon: Garner Nash, DO;  Location: [facility]  ENDOSCOPY;  Service: Pulmonary;;  ? CHOLECYSTECTOMY    ? COLONOSCOPY    ? ENDOBRONCHIAL ULTRASOUND  10/30/2020  ? Procedure: ENDOBRONCHIAL ULTRASOUND;  Surgeon: Garner Nash, DO;  Location: [place] [facility] [place] [facility]OSCOPY

## 2021-08-27 NOTE — Progress Notes (Addendum)
SDW CALL ? ?Patient was given pre-op instructions over the phone. The opportunity was given for the patient to ask questions. No further questions asked. Patient verbalized understanding of instructions given. ? ? ?PCP - Dr. Leonie Douglas ?Cardiologist - Dr. Domenic Polite ? ?PPM/ICD - denies ? ? ?Chest x-ray - 08/06/21 ?EKG - 07/08/21 ?Stress Test -  ?ECHO - 08/19/21 ?Cardiac Cath - 03/30/18  ? ? ?Fasting Blood Sugar - low 100's ?Checks Blood Sugar __one___ times a day ? ?Patient instructed to check blood sugar morning of surgery and if less than 70 to drink 1/2 cup of apple juice and to recheck.  Patient verbalized understanding.   ? ?Blood Thinner Instructions: last dose of Plavix was 5/8 ?Aspirin Instructions: pt. States that he was not given instructions for Aspirin but will not take DOS.   ?   -Dr. Valeta Harms notified and stated that patient can continue Aspirin ? ?COVID TEST-  DOS ? ?Pt. Wears 2 liters of oxygen ? ? ?Anesthesia review: yes ? ?Patient denies shortness of breath, fever, cough and chest pain over the phone call ? ? ?All instructions explained to the patient, with a verbal understanding of the material. The opportunity to ask questions was provided.  ?

## 2021-08-30 ENCOUNTER — Ambulatory Visit (HOSPITAL_COMMUNITY): Payer: Medicare HMO | Admitting: Vascular Surgery

## 2021-08-30 ENCOUNTER — Ambulatory Visit (HOSPITAL_BASED_OUTPATIENT_CLINIC_OR_DEPARTMENT_OTHER): Payer: Medicare HMO | Admitting: Vascular Surgery

## 2021-08-30 ENCOUNTER — Encounter (HOSPITAL_COMMUNITY): Payer: Self-pay | Admitting: Pulmonary Disease

## 2021-08-30 ENCOUNTER — Ambulatory Visit (HOSPITAL_COMMUNITY)
Admission: RE | Admit: 2021-08-30 | Discharge: 2021-08-30 | Disposition: A | Discharge status: Home / Self Care | Payer: Medicare HMO | Attending: Pulmonary Disease | Admitting: Pulmonary Disease

## 2021-08-30 ENCOUNTER — Encounter (HOSPITAL_COMMUNITY)
Admission: RE | Disposition: A | Discharge status: Home / Self Care | Payer: Self-pay | Source: Home / Self Care | Attending: Pulmonary Disease

## 2021-08-30 DIAGNOSIS — E119 Type 2 diabetes mellitus without complications: Secondary | ICD-10-CM | POA: Insufficient documentation

## 2021-08-30 DIAGNOSIS — Z7982 Long term (current) use of aspirin: Secondary | ICD-10-CM | POA: Insufficient documentation

## 2021-08-30 DIAGNOSIS — K219 Gastro-esophageal reflux disease without esophagitis: Secondary | ICD-10-CM | POA: Insufficient documentation

## 2021-08-30 DIAGNOSIS — J449 Chronic obstructive pulmonary disease, unspecified: Secondary | ICD-10-CM | POA: Diagnosis not present

## 2021-08-30 DIAGNOSIS — Z20822 Contact with and (suspected) exposure to covid-19: Secondary | ICD-10-CM | POA: Diagnosis not present

## 2021-08-30 DIAGNOSIS — Z7902 Long term (current) use of antithrombotics/antiplatelets: Secondary | ICD-10-CM | POA: Insufficient documentation

## 2021-08-30 DIAGNOSIS — Z6837 Body mass index (BMI) 37.0-37.9, adult: Secondary | ICD-10-CM | POA: Diagnosis not present

## 2021-08-30 DIAGNOSIS — K760 Fatty (change of) liver, not elsewhere classified: Secondary | ICD-10-CM | POA: Diagnosis not present

## 2021-08-30 DIAGNOSIS — F17210 Nicotine dependence, cigarettes, uncomplicated: Secondary | ICD-10-CM | POA: Diagnosis not present

## 2021-08-30 DIAGNOSIS — I509 Heart failure, unspecified: Secondary | ICD-10-CM

## 2021-08-30 DIAGNOSIS — C3491 Malignant neoplasm of unspecified part of right bronchus or lung: Secondary | ICD-10-CM

## 2021-08-30 DIAGNOSIS — C3431 Malignant neoplasm of lower lobe, right bronchus or lung: Secondary | ICD-10-CM | POA: Insufficient documentation

## 2021-08-30 DIAGNOSIS — I2510 Atherosclerotic heart disease of native coronary artery without angina pectoris: Secondary | ICD-10-CM | POA: Insufficient documentation

## 2021-08-30 DIAGNOSIS — J910 Malignant pleural effusion: Secondary | ICD-10-CM | POA: Diagnosis not present

## 2021-08-30 DIAGNOSIS — I252 Old myocardial infarction: Secondary | ICD-10-CM | POA: Diagnosis not present

## 2021-08-30 DIAGNOSIS — Z923 Personal history of irradiation: Secondary | ICD-10-CM | POA: Diagnosis not present

## 2021-08-30 DIAGNOSIS — I110 Hypertensive heart disease with heart failure: Secondary | ICD-10-CM

## 2021-08-30 HISTORY — PX: BRONCHIAL BRUSHINGS: SHX5108

## 2021-08-30 HISTORY — PX: VIDEO BRONCHOSCOPY: SHX5072

## 2021-08-30 LAB — GLUCOSE, CAPILLARY
Glucose-Capillary: 154 mg/dL — ABNORMAL HIGH (ref 70–99)
Glucose-Capillary: 102 mg/dL — ABNORMAL HIGH (ref 70–99)
Glucose-Capillary: 121 mg/dL — ABNORMAL HIGH (ref 70–99)

## 2021-08-30 LAB — SARS CORONAVIRUS 2 BY RT PCR: SARS Coronavirus 2 by RT PCR: NEGATIVE

## 2021-08-30 SURGERY — VIDEO BRONCHOSCOPY WITHOUT FLUORO
Anesthesia: General

## 2021-08-30 MED ORDER — AMISULPRIDE (ANTIEMETIC) 5 MG/2ML IV SOLN: 10.0000 mg | Freq: Once | INTRAVENOUS | Status: DC | PRN

## 2021-08-30 MED ORDER — OXYCODONE HCL 5 MG PO TABS: 5.0000 mg | ORAL_TABLET | Freq: Once | ORAL | Status: DC | PRN

## 2021-08-30 MED ORDER — FENTANYL CITRATE (PF) 250 MCG/5ML IJ SOLN
INTRAMUSCULAR | Status: DC | PRN
  Administered 2021-08-30: 25 ug via INTRAVENOUS

## 2021-08-30 MED ORDER — HYDROMORPHONE HCL 1 MG/ML IJ SOLN: 0.2500 mg | INTRAMUSCULAR | Status: DC | PRN

## 2021-08-30 MED ORDER — LACTATED RINGERS IV SOLN: INTRAVENOUS | Status: DC

## 2021-08-30 MED ORDER — MIDAZOLAM HCL 2 MG/2ML IJ SOLN
INTRAMUSCULAR | Status: DC | PRN
  Administered 2021-08-30: 1 mg via INTRAVENOUS

## 2021-08-30 MED ORDER — OXYCODONE HCL 5 MG/5ML PO SOLN: 5.0000 mg | Freq: Once | ORAL | Status: DC | PRN

## 2021-08-30 MED ORDER — PROMETHAZINE HCL 25 MG/ML IJ SOLN: 6.2500 mg | INTRAMUSCULAR | Status: DC | PRN

## 2021-08-30 MED ORDER — PHENYLEPHRINE HCL-NACL 20-0.9 MG/250ML-% IV SOLN
INTRAVENOUS | Status: DC | PRN
  Administered 2021-08-30: 50 ug/min via INTRAVENOUS

## 2021-08-30 MED ORDER — ROCURONIUM BROMIDE 10 MG/ML (PF) SYRINGE
PREFILLED_SYRINGE | INTRAVENOUS | Status: DC | PRN
  Administered 2021-08-30: 100 mg via INTRAVENOUS

## 2021-08-30 MED ORDER — LIDOCAINE 2% (20 MG/ML) 5 ML SYRINGE
INTRAMUSCULAR | Status: DC | PRN
  Administered 2021-08-30: 100 mg via INTRAVENOUS

## 2021-08-30 MED ORDER — INSULIN ASPART 100 UNIT/ML IJ SOLN
0.0000 [IU] | INTRAMUSCULAR | Status: DC | PRN
  Administered 2021-08-30: 2 [IU] via SUBCUTANEOUS
  Filled 2021-08-30: qty 0.14
  Filled 2021-08-30: qty 1
  Filled 2021-08-30: qty 0.14

## 2021-08-30 MED ORDER — SERTRALINE HCL 50 MG PO TABS
50.0000 mg | ORAL_TABLET | Freq: Every day | ORAL | 0 refills | Status: AC
Start: 2021-08-30 — End: ?

## 2021-08-30 MED ORDER — CHLORHEXIDINE GLUCONATE 0.12 % MT SOLN
OROMUCOSAL | Status: AC
  Filled 2021-08-30: qty 15

## 2021-08-30 MED ORDER — ONDANSETRON HCL 4 MG/2ML IJ SOLN
INTRAMUSCULAR | Status: DC | PRN
  Administered 2021-08-30: 4 mg via INTRAVENOUS

## 2021-08-30 MED ORDER — DEXAMETHASONE SODIUM PHOSPHATE 10 MG/ML IJ SOLN
INTRAMUSCULAR | Status: DC | PRN
  Administered 2021-08-30: 8 mg via INTRAVENOUS

## 2021-08-30 MED ORDER — PROPOFOL 10 MG/ML IV BOLUS
INTRAVENOUS | Status: DC | PRN
  Administered 2021-08-30: 150 mg via INTRAVENOUS

## 2021-08-30 NOTE — Anesthesia Procedure Notes (Signed)
Procedure Name: Intubation ?Date/Time: 08/30/2021 9:45 AM ?Performed by: Mariea Clonts, CRNA ?Pre-anesthesia Checklist: Patient identified, Emergency Drugs available, Suction available and Patient being monitored ?Patient Re-evaluated:Patient Re-evaluated prior to induction ?Oxygen Delivery Method: Circle System Utilized ?Preoxygenation: Pre-oxygenation with 100% oxygen ?Induction Type: IV induction ?Ventilation: Oral airway inserted - appropriate to patient size, Two handed mask ventilation required and Mask ventilation throughout procedure ?Laryngoscope Size: Glidescope and 4 ?Grade View: Grade I ?Tube type: Oral ?Tube size: 8.5 mm ?Number of attempts: 1 ?Airway Equipment and Method: Stylet and Oral airway ?Placement Confirmation: ETT inserted through vocal cords under direct vision, positive ETCO2 and breath sounds checked- equal and bilateral ?Tube secured with: Tape ?Dental Injury: Teeth and Oropharynx as per pre-operative assessment  ? ? ? ? ?

## 2021-08-30 NOTE — Op Note (Signed)
Video Bronchoscopy Procedure Note ?Drainage of indwelling pleural catheter. ? ?Date of Operation: 08/30/2021 ? ?Pre-op Diagnosis: right lung cancer  ? ?Post-op Diagnosis: Right lung cancer, effusion  ? ?Surgeon: Garner Nash, DO  ? ?Assistants: none ? ?Anesthesia: general  ? ?Operation: Flexible video fiberoptic bronchoscopy and biopsies. ? ?Estimated Blood Loss: <1cc ? ?Complications: none noted ? ?Indications and History: ?Thomas [name] is 66 y.o. with history of lung cancer, effusion.  Recommendation was to perform video fiberoptic bronchoscopy with biopsies. The risks, benefits, complications, treatment options and expected outcomes were discussed with the patient.  The possibilities of pneumothorax, pneumonia, reaction to medication, pulmonary aspiration, perforation of a viscus, bleeding, failure to diagnose a condition and creating a complication requiring transfusion or operation were discussed with the patient who freely signed the consent.   ? ?Description of Procedure: ?The patient was seen in the Preoperative Area, was examined and was deemed appropriate to proceed.  The patient was taken to [facility] End[facility] Room 3, identified as [facility] and the procedure verified as Flexible Video Fiberoptic Bronchoscopy.  A Time Out was held and the above information confirmed.  ? ?Therapeutic scope was inserted into the endotracheal tube bilateral mainstem's and trachea inspected.  There was mucus plugging in the right lower lobe that was suctioned to clear.  There was visible airway compression of the right lower lobe but no visible endobronchial tumor.  There was some irregular mucosa within the superior segment of the right lower lobe that was brushed. ? ?Following examination of the airway we took down the patient's dressing for the indwelling pleural catheter.  Drainage of the pleural catheter was completed using the standard Pleurx catheter drain set that the patient brought with him to the hospital.  We ended  up using 1 prior catheters tubing system here as well.  We had about 1400 cc drained. ? ?Samples: ?1. Endobronchial brushings from right lower lobe ?2.  Pleural fluid to be sent for cytology from indwelling pleural drain. ? ?Plans:  ?We will review the cytology, pathology results with the patient when they become available.  Outpatient followup will be with Dr Valeta Harms.  ? ?Garner Nash, DO ?[facility] ?08/30/2021 10:27 AM   ?

## 2021-08-30 NOTE — H&P (Signed)
? ?Synopsis: Referred in May  for lung mass by No ref. provider found ? ?Subjective:  ? ?PATIENT ID: Thomas [name] GENDER: male DOB: September 18, 1955, MRN: [id number] ? ?No chief complaint on file. ? ? ?This is a 66 year old gentleman past medical history of congestive heart failure, COPD, NSTEMI, prior cardiomyopathy, repeat echocardiogram in 2020 with a EF of 45%.  He is morbidly obese.  He recently had a lung cancer screening CT which showed right lower lobe collapse and possible endobronchial mass.  He has been a longstanding smoker.  He has smoked since age 38 years old.  He has had a persistent right-sided chest wheeze.  He was admitted back in January with pneumonia.  Chest x-ray at the time also showed right lower lobe partial collapse.  Now the whole lower lobe is collapsed.   ?  ?OV 08/16/2021: Telephone visit today.  Seen initially back in June 2022 diagnosed with lung cancer.  Undergoing treatments with Dr. Delton Coombes.  Had a recent PET scan in March showing a right-sided pleural effusion.  He underwent a Pleurx catheter placement by interventional radiology.  He has tumor in the lower lobe of his airway that is occluding his bronchus.  Patient was referred back to me by Dr. Melvyn Novas for consideration of tumor debulking to see if we can alleviate some of his dyspnea and shortness of breath related to endobronchial tumor and right lower lobe collapse. ? ? ? ?Past Medical History:  ?Diagnosis Date  ? Arthritis   ? Bone spur   ? cervical - numbness left arm and hand  ? Cancer [facility])   ? CHF (congestive heart failure) ([place])   ? COPD (chronic obstructive pulmonary disease) ([place])   ? Coronary artery disease   ? Dyspnea   ? Oxygen 2.5L via [place] qhs and occasionally during the day prn  ? Fatty liver   ? GERD (gastroesophageal reflux disease)   ? Lung cancer ([place])   ? Morbid obesity ([place])   ? Neuromuscular disorder ([place])   ? NSTEMI (non-ST elevated myocardial infarction) ([place])   ? a. s/p cath in 03/2018 showing 90% OM2 stenosis  and nonobstructive CAD along LAD and LCx with medical management recommended  ? Pneumonia   ? x2  ? Secondary cardiomyopathy ([place])   ? Snoring   ? Type 2 diabetes mellitus ([place])   ?  ? ?Family History  ?Problem Relation Age of Onset  ? Emphysema Father   ? Heart failure Father   ?  ? ?Past Surgical History:  ?Procedure Laterality Date  ? BRONCHIAL BIOPSY  10/30/2020  ? Procedure: BRONCHIAL BIOPSIES;  Surgeon: Garner Nash, DO;  Location: [place] ENDOSCOPY;  Service: Pulmonary;;  ? BRONCHIAL BRUSHINGS  10/30/2020  ? Procedure: BRONCHIAL BRUSHINGS;  Surgeon: Garner Nash, DO;  Location: [facility] ENDOSCOPY;  Service: Pulmonary;;  ? BRONCHIAL WASHINGS  10/30/2020  ? Procedure: BRONCHIAL WASHINGS;  Surgeon: Garner Nash, DO;  Location: [facility]OSCOPY;  Service: Pulmonary;;  ? CHOLECYSTECTOMY    ? COLONOSCOPY    ? ENDOBRONCHIAL ULTRASOUND  10/30/2020  ? Procedure: ENDOBRONCHIAL ULTRASOUND;  Surgeon: Garner Nash, DO;  Location: [facility];  Service: Pulmonary;;  ? IR PERC PLEURAL DRAIN W/INDWELL CATH W/IMG GUIDE  08/06/2021  ? LEFT HEART CATH AND CORONARY ANGIOGRAPHY N/A 03/30/2018  ? Procedure: LEFT HEART CATH AND CORONARY ANGIOGRAPHY;  Surgeon: Troy Sine, MD;  Location: [facility];  Service: Cardiovascular;  Laterality: N/A;  ? SHOULDER ARTHROSCOPY Bilateral   ?  TONSILLECTOMY    ? UPPER GI ENDOSCOPY    ? VIDEO BRONCHOSCOPY Right 10/30/2020  ? Procedure: VIDEO BRONCHOSCOPY WITHOUT FLUORO;  Surgeon: Garner Nash, DO;  Location: [facility];  Service: Pulmonary;  Laterality: Right;  possible cryotherapy  ? WISDOM TOOTH EXTRACTION    ? ? ?Social History  ? ?Socioeconomic History  ? Marital status: Married  ?  Spouse name: Not on file  ? Number of children: Not on file  ? Years of education: Not on file  ? Highest education level: Not on file  ?Occupational History  ? Not on file  ?Tobacco Use  ? Smoking status: Some Days  ?  Packs/day: 0.50  ?  Years: 55.00  ?  Pack years: 27.50  ?  Types: Cigarettes  ?   Start date: 02/21/1965  ?  Last attempt to quit: 04/01/2021  ?  Years since quitting: 0.4  ? Smokeless tobacco: Never  ? Tobacco comments:  ?   1/2 pack a day [facility] 08/16/2021  ?Vaping Use  ? Vaping Use: Never used  ?Substance and Sexual Activity  ? Alcohol use: Not Currently  ? Drug use: Yes  ?  Types: Marijuana  ?  Comment: Last use 1970's  ? Sexual activity: Not Currently  ?Other Topics Concern  ? Not on file  ?Social History Narrative  ? Not on file  ? ?Social Determinants of Health  ? ?Financial Resource Strain: Low Risk   ? Difficulty of Paying Living Expenses: Not hard at all  ?Food Insecurity: No Food Insecurity  ? Worried About Charity fundraiser in the Last Year: Never true  ? Ran Out of Food in the Last Year: Never true  ?Transportation Needs: No Transportation Needs  ? Lack of Transportation (Medical): No  ? Lack of Transportation (Non-Medical): No  ?Physical Activity: Inactive  ? Days of Exercise per Week: 0 days  ? Minutes of Exercise per Session: 0 min  ?Stress: No Stress Concern Present  ? Feeling of Stress : Not at all  ?Social Connections: Moderately Isolated  ? Frequency of Communication with Friends and Family: More than three times a week  ? Frequency of Social Gatherings with Friends and Family: More than three times a week  ? Attends Religious Services: Never  ? Active Member of Clubs or Organizations: No  ? Attends Archivist Meetings: Never  ? Marital Status: Married  ?Intimate Partner Violence: Not At Risk  ? Fear of Current or Ex-Partner: No  ? Emotionally Abused: No  ? Physically Abused: No  ? Sexually Abused: No  ?  ? ?Allergies  ?Allergen Reactions  ? Glipizide Palpitations and Other (See Comments)  ? Codeine Nausea And Vomiting and Other (See Comments)  ? Doxycycline Other (See Comments)  ?  GI-Upset  ?  ? ?@ENCMEDSTART @ ? ?Review of Systems  ?Constitutional:  Positive for malaise/fatigue. Negative for chills, fever and weight loss.  ?HENT:  Negative for hearing loss, sore throat  and tinnitus.   ?Eyes:  Negative for blurred vision and double vision.  ?Respiratory:  Positive for shortness of breath. Negative for cough, hemoptysis, sputum production, wheezing and stridor.   ?Cardiovascular:  Negative for chest pain, palpitations, orthopnea, leg swelling and PND.  ?Gastrointestinal:  Negative for abdominal pain, constipation, diarrhea, heartburn, nausea and vomiting.  ?Genitourinary:  Negative for dysuria, hematuria and urgency.  ?Musculoskeletal:  Negative for joint pain and myalgias.  ?Skin:  Negative for itching and rash.  ?Neurological:  Negative for dizziness,  tingling, weakness and headaches.  ?Endo/Heme/Allergies:  Negative for environmental allergies. Does not bruise/bleed easily.  ?Psychiatric/Behavioral:  Negative for depression. The patient is not nervous/anxious and does not have insomnia.   ?All other systems reviewed and are negative. ? ? ?Objective:  ?Physical Exam ?Vitals reviewed.  ?Constitutional:   ?   General: He is not in acute distress. ?   Appearance: He is well-developed. He is obese.  ?HENT:  ?   Head: Normocephalic and atraumatic.  ?Eyes:  ?   General: No scleral icterus. ?   Conjunctiva/sclera: Conjunctivae normal.  ?   Pupils: Pupils are equal, round, and reactive to light.  ?Neck:  ?   Vascular: No JVD.  ?   Trachea: No tracheal deviation.  ?Cardiovascular:  ?   Rate and Rhythm: Normal rate and regular rhythm.  ?   Heart sounds: Normal heart sounds. No murmur heard. ?Pulmonary:  ?   Effort: Pulmonary effort is normal. No tachypnea, accessory muscle usage or respiratory distress.  ?   Breath sounds: No stridor. No wheezing, rhonchi or rales.  ?Abdominal:  ?   Palpations: Abdomen is soft.  ?Musculoskeletal:     ?   General: No tenderness.  ?   Cervical back: Neck supple.  ?   Comments: Right chest pleurex, skin breakdown from tape   ?Lymphadenopathy:  ?   Cervical: No cervical adenopathy.  ?Skin: ?   General: Skin is warm and dry.  ?   Capillary Refill: Capillary  refill takes less than 2 seconds.  ?   Findings: No rash.  ?Neurological:  ?   Mental Status: He is alert and oriented to person, place, and time.  ?Psychiatric:     ?   Behavior: Behavior normal.  ? ? ?

## 2021-08-30 NOTE — Anesthesia Postprocedure Evaluation (Signed)
Anesthesia Post Note ? ?Patient: Thomas [name] ? ?Procedure(s) Performed: VIDEO BRONCHOSCOPY WITHOUT FLUORO ?BRONCHIAL BRUSHINGS ? ?  ? ?Patient location during evaluation: PACU ?Anesthesia Type: General ?Level of consciousness: awake and alert ?Pain management: pain level controlled ?Vital Signs Assessment: post-procedure vital signs reviewed and stable ?Respiratory status: spontaneous breathing, nonlabored ventilation and respiratory function stable ?Cardiovascular status: blood pressure returned to baseline and stable ?Postop Assessment: no apparent nausea or vomiting ?Anesthetic complications: no ? ? ?No notable events documented. ? ?Last Vitals:  ?Vitals:  ? 08/30/21 1040 08/30/21 1045  ?BP: (!) 141/71   ?Pulse: 98 92  ?Resp: 19 (!) 21  ?Temp:  37.1 ?C  ?SpO2: 94% 95%  ?  ?Last Pain:  ?Vitals:  ? 08/30/21 1040  ?TempSrc:   ?PainSc: 0-No pain  ? ? ?  ?  ?  ?  ?  ?  ? ?Lynda Rainwater ? ? ? ? ?

## 2021-08-30 NOTE — Transfer of Care (Signed)
Immediate Anesthesia Transfer of Care Note ? ?Patient: Thomas [name] ? ?Procedure(s) Performed: VIDEO BRONCHOSCOPY WITHOUT FLUORO ?BRONCHIAL BRUSHINGS ? ?Patient Location: PACU ? ?Anesthesia Type:General ? ?Level of Consciousness: awake, alert  and oriented ? ?Airway & Oxygen Therapy: Patient Spontanous Breathing and Patient connected to nasal cannula oxygen ? ?Post-op Assessment: Report given to RN and Post -op Vital signs reviewed and stable ? ?Post vital signs: Reviewed and stable ? ?Last Vitals:  ?Vitals Value Taken Time  ?BP    ?Temp    ?Pulse    ?Resp    ?SpO2    ? ? ?Last Pain:  ?Vitals:  ? 08/30/21 1040  ?TempSrc:   ?PainSc: 0-No pain  ?   ? ?Patients Stated Pain Goal: 2 (08/30/21 0730) ? ?Complications: No notable events documented. ?

## 2021-08-30 NOTE — H&P (View-Only) (Signed)
Synopsis: Referred in May  for lung mass by No ref. provider found  Subjective:   PATIENT ID: Thomas [name] GENDER: male DOB: 1956-02-19, MRN: [id number]  No chief complaint on file.   This is a 66 year old gentleman past medical history of congestive heart failure, COPD, NSTEMI, prior cardiomyopathy, repeat echocardiogram in 2020 with a EF of 45%.  He is morbidly obese.  He recently had a lung cancer screening CT which showed right lower lobe collapse and possible endobronchial mass.  He has been a longstanding smoker.  He has smoked since age 7 years old.  He has had a persistent right-sided chest wheeze.  He was admitted back in January with pneumonia.  Chest x-ray at the time also showed right lower lobe partial collapse.  Now the whole lower lobe is collapsed.     OV 08/16/2021: Telephone visit today.  Seen initially back in June 2022 diagnosed with lung cancer.  Undergoing treatments with Dr. Delton Coombes.  Had a recent PET scan in March showing a right-sided pleural effusion.  He underwent a Pleurx catheter placement by interventional radiology.  He has tumor in the lower lobe of his airway that is occluding his bronchus.  Patient was referred back to me by Dr. Melvyn Novas for consideration of tumor debulking to see if we can alleviate some of his dyspnea and shortness of breath related to endobronchial tumor and right lower lobe collapse.    Past Medical History:  Diagnosis Date   Arthritis    Bone spur    cervical - numbness left arm and hand   Cancer (HCC)    CHF (congestive heart failure) (HCC)    COPD (chronic obstructive pulmonary disease) (HCC)    Coronary artery disease    Dyspnea    Oxygen 2.5L via [place] qhs and occasionally during the day prn   Fatty liver    GERD (gastroesophageal reflux disease)    Lung cancer (HCC)    Morbid obesity ([place])    Neuromuscular disorder ([place])    NSTEMI (non-ST elevated myocardial infarction) ([place])    a. s/p cath in 03/2018 showing 90% OM2 stenosis  and nonobstructive CAD along LAD and LCx with medical management recommended   Pneumonia    x2   Secondary cardiomyopathy ([place])    Snoring    Type 2 diabetes mellitus ([place])      Family History  Problem Relation Age of Onset   Emphysema Father    Heart failure Father      Past Surgical History:  Procedure Laterality Date   BRONCHIAL BIOPSY  10/30/2020   Procedure: BRONCHIAL BIOPSIES;  Surgeon: Garner Nash, DO;  Location: [facility];  Service: Pulmonary;;   BRONCHIAL BRUSHINGS  10/30/2020   Procedure: BRONCHIAL BRUSHINGS;  Surgeon: Garner Nash, DO;  Location: [facility];  Service: Pulmonary;;   BRONCHIAL WASHINGS  10/30/2020   Procedure: BRONCHIAL WASHINGS;  Surgeon: Garner Nash, DO;  Location: [facility];  Service: Pulmonary;;   CHOLECYSTECTOMY     COLONOSCOPY     ENDOBRONCHIAL ULTRASOUND  10/30/2020   Procedure: ENDOBRONCHIAL ULTRASOUND;  Surgeon: Garner Nash, DO;  Location: [facility];  Service: Pulmonary;;   IR PERC PLEURAL DRAIN W/INDWELL CATH W/IMG GUIDE  08/06/2021   LEFT HEART CATH AND CORONARY ANGIOGRAPHY N/A 03/30/2018   Procedure: LEFT HEART CATH AND CORONARY ANGIOGRAPHY;  Surgeon: Troy Sine, MD;  Location: [facility];  Service: Cardiovascular;  Laterality: N/A;   SHOULDER ARTHROSCOPY Bilateral  TONSILLECTOMY     UPPER GI ENDOSCOPY     VIDEO BRONCHOSCOPY Right 10/30/2020   Procedure: VIDEO BRONCHOSCOPY WITHOUT FLUORO;  Surgeon: Garner Nash, DO;  Location: [facility];  Service: Pulmonary;  Laterality: Right;  possible cryotherapy   WISDOM TOOTH EXTRACTION      Social History   Socioeconomic History   Marital status: Married    Spouse name: Not on file   Number of children: Not on file   Years of education: Not on file   Highest education level: Not on file  Occupational History   Not on file  Tobacco Use   Smoking status: Some Days    Packs/day: 0.50    Years: 55.00    Pack years: 27.50    Types: Cigarettes     Start date: 02/21/1965    Last attempt to quit: 04/01/2021    Years since quitting: 0.4   Smokeless tobacco: Never   Tobacco comments:     1/2 pack a day [facility] 08/16/2021  Vaping Use   Vaping Use: Never used  Substance and Sexual Activity   Alcohol use: Not Currently   Drug use: Yes    Types: Marijuana    Comment: Last use 1970's   Sexual activity: Not Currently  Other Topics Concern   Not on file  Social History Narrative   Not on file   Social Determinants of Health   Financial Resource Strain: Low Risk    Difficulty of Paying Living Expenses: Not hard at all  Food Insecurity: No Food Insecurity   Worried About Charity fundraiser in the Last Year: Never true   Ran Out of Food in the Last Year: Never true  Transportation Needs: No Transportation Needs   Lack of Transportation (Medical): No   Lack of Transportation (Non-Medical): No  Physical Activity: Inactive   Days of Exercise per Week: 0 days   Minutes of Exercise per Session: 0 min  Stress: No Stress Concern Present   Feeling of Stress : Not at all  Social Connections: Moderately Isolated   Frequency of Communication with Friends and Family: More than three times a week   Frequency of Social Gatherings with Friends and Family: More than three times a week   Attends Religious Services: Never   Marine scientist or Organizations: No   Attends Music therapist: Never   Marital Status: Married  Human resources officer Violence: Not At Risk   Fear of Current or Ex-Partner: No   Emotionally Abused: No   Physically Abused: No   Sexually Abused: No     Allergies  Allergen Reactions   Glipizide Palpitations and Other (See Comments)   Codeine Nausea And Vomiting and Other (See Comments)   Doxycycline Other (See Comments)    GI-Upset     @ENCMEDSTART @  Review of Systems  Constitutional:  Positive for malaise/fatigue. Negative for chills, fever and weight loss.  HENT:  Negative for hearing loss, sore throat  and tinnitus.   Eyes:  Negative for blurred vision and double vision.  Respiratory:  Positive for shortness of breath. Negative for cough, hemoptysis, sputum production, wheezing and stridor.   Cardiovascular:  Negative for chest pain, palpitations, orthopnea, leg swelling and PND.  Gastrointestinal:  Negative for abdominal pain, constipation, diarrhea, heartburn, nausea and vomiting.  Genitourinary:  Negative for dysuria, hematuria and urgency.  Musculoskeletal:  Negative for joint pain and myalgias.  Skin:  Negative for itching and rash.  Neurological:  Negative for dizziness,  tingling, weakness and headaches.  Endo/Heme/Allergies:  Negative for environmental allergies. Does not bruise/bleed easily.  Psychiatric/Behavioral:  Negative for depression. The patient is not nervous/anxious and does not have insomnia.   All other systems reviewed and are negative.   Objective:  Physical Exam Vitals reviewed.  Constitutional:      General: He is not in acute distress.    Appearance: He is well-developed. He is obese.  HENT:     Head: Normocephalic and atraumatic.  Eyes:     General: No scleral icterus.    Conjunctiva/sclera: Conjunctivae normal.     Pupils: Pupils are equal, round, and reactive to light.  Neck:     Vascular: No JVD.     Trachea: No tracheal deviation.  Cardiovascular:     Rate and Rhythm: Normal rate and regular rhythm.     Heart sounds: Normal heart sounds. No murmur heard. Pulmonary:     Effort: Pulmonary effort is normal. No tachypnea, accessory muscle usage or respiratory distress.     Breath sounds: No stridor. No wheezing, rhonchi or rales.  Abdominal:     Palpations: Abdomen is soft.  Musculoskeletal:        General: No tenderness.     Cervical back: Neck supple.     Comments: Right chest pleurex, skin breakdown from tape   Lymphadenopathy:     Cervical: No cervical adenopathy.  Skin:    General: Skin is warm and dry.     Capillary Refill: Capillary  refill takes less than 2 seconds.     Findings: No rash.  Neurological:     Mental Status: He is alert and oriented to person, place, and time.  Psychiatric:        Behavior: Behavior normal.     Vitals:   08/30/21 0631  BP: 121/74  Pulse: 82  Resp: 20  Temp: 98.3 F (36.8 C)  TempSrc: Oral  SpO2: 90%  Weight: 114.8 kg  Height: 5\' 9"  (1.753 m)   90% on RA BMI Readings from Last 3 Encounters:  08/30/21 37.36 kg/m  08/06/21 36.92 kg/m  07/27/21 37.95 kg/m   Wt Readings from Last 3 Encounters:  08/30/21 114.8 kg  08/06/21 113.4 kg  07/27/21 116.6 kg     CBC    Component Value Date/Time   WBC 6.0 08/06/2021 0945   RBC 5.08 08/06/2021 0945   HGB 15.1 08/06/2021 0945   HCT 48.2 08/06/2021 0945   PLT 202 08/06/2021 0945   MCV 94.9 08/06/2021 0945   MCH 29.7 08/06/2021 0945   MCHC 31.3 08/06/2021 0945   RDW 14.0 08/06/2021 0945   LYMPHSABS 0.4 (L) 08/06/2021 0945   MONOABS 0.5 08/06/2021 0945   EOSABS 0.1 08/06/2021 0945   BASOSABS 0.0 08/06/2021 0945     Chest Imaging: Recent pet reviewed   Pulmonary Functions Testing Results:    Latest Ref Rng & Units 06/21/2021    9:12 AM  PFT Results  FVC-Pre L 1.48    FVC-Predicted Pre % 33    FVC-Post L 1.61    FVC-Predicted Post % 36    Pre FEV1/FVC % % 56    Post FEV1/FCV % % 58    FEV1-Pre L 0.83    FEV1-Predicted Pre % 24    FEV1-Post L 0.93    DLCO uncorrected ml/min/mmHg 9.57    DLCO UNC% % 36    DLCO corrected ml/min/mmHg 9.74    DLCO COR %Predicted % 37    DLVA Predicted % 83  TLC L 4.74    TLC % Predicted % 69    RV % Predicted % 154      FeNO:   Pathology:   Echocardiogram:   Heart Catheterization:     Assessment & Plan:     ICD-10-CM   1. 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    Current Facility-Administered Medications:    chlorhexidine (PERIDEX) 0.12 % solution, , , ,    insulin aspart (novoLOG) injection 0-14 Units, 0-14 Units, Subcutaneous, Q2H PRN, Lynda Rainwater, MD, 2 Units at 08/30/21 2575   lactated ringers infusion, , Intravenous, Continuous, Sabra Heck Carles Collet, MD    Garner Nash, DO [facility] 08/30/2021 7:41 AM

## 2021-08-30 NOTE — Interval H&P Note (Signed)
History and Physical Interval Note: ? ?08/30/2021 ?9:25 AM ? ?[name]  has presented today for surgery, with the diagnosis of endobronchial cancer.  The various methods of treatment have been discussed with the patient and family. After consideration of risks, benefits and other options for treatment, the patient has consented to  Procedure(s) with comments: ?VIDEO BRONCHOSCOPY WITHOUT FLUORO (N/A) - cryotherapy and tumor debulking as a surgical intervention.  The patient's history has been reviewed, patient examined, no change in status, stable for surgery.  I have reviewed the patient's chart and labs.  Questions were answered to the patient's satisfaction.   ? ? ?Thomas [name] Tennyson Wacha ? ? ?

## 2021-08-30 NOTE — Discharge Instructions (Signed)
Flexible Bronchoscopy, Care After ?This sheet gives you information about how to care for yourself after your test. Your doctor may also give you more specific instructions. If you have problems or questions, contact your doctor. ?Follow these instructions at home: ?Eating and drinking ?Do not eat or drink anything (not even water) for 2 hours after your test, or until your numbing medicine (local anesthetic) wears off. ?When your numbness is gone and your cough and gag reflexes have come back, you may: ?Eat only soft foods. ?Slowly drink liquids. ?The day after the test, go back to your normal diet. ?Driving ?Do not drive for 24 hours if you were given a medicine to help you relax (sedative). ?Do not drive or use heavy machinery while taking prescription pain medicine. ?General instructions ? ?Take over-the-counter and prescription medicines only as told by your doctor. ?Return to your normal activities as told. Ask what activities are safe for you. ?Do not use any products that have nicotine or tobacco in them. This includes cigarettes and e-cigarettes. If you need help quitting, ask your doctor. ?Keep all follow-up visits as told by your doctor. This is important. It is very important if you had a tissue sample (biopsy) taken. ?Get help right away if: ?You have shortness of breath that gets worse. ?You get light-headed. ?You feel like you are going to pass out (faint). ?You have chest pain. ?You cough up: ?More than a little blood. ?More blood than before. ?Summary ?Do not eat or drink anything (not even water) for 2 hours after your test, or until your numbing medicine wears off. ?Do not use cigarettes. Do not use e-cigarettes. ?Get help right away if you have chest pain. ? ?This information is not intended to replace advice given to you by your health care provider. Make sure you discuss any questions you have with your health care provider. ?Document Released: 01/30/2009 Document Revised: 03/17/2017 Document  Reviewed: 04/22/2016 ?Elsevier Patient Education ? [place]. ? ?

## 2021-08-31 ENCOUNTER — Encounter (HOSPITAL_COMMUNITY): Payer: Self-pay | Admitting: Pulmonary Disease

## 2021-08-31 LAB — CYTOLOGY - NON PAP

## 2021-09-03 ENCOUNTER — Other Ambulatory Visit (HOSPITAL_COMMUNITY): Payer: Self-pay | Admitting: Diagnostic Radiology

## 2021-09-03 ENCOUNTER — Ambulatory Visit (HOSPITAL_COMMUNITY)
Admission: RE | Admit: 2021-09-03 | Discharge: 2021-09-03 | Disposition: A | Discharge status: Home / Self Care | Payer: Medicare HMO | Source: Ambulatory Visit | Attending: Diagnostic Radiology | Admitting: Diagnostic Radiology

## 2021-09-03 DIAGNOSIS — C3490 Malignant neoplasm of unspecified part of unspecified bronchus or lung: Secondary | ICD-10-CM

## 2021-09-06 ENCOUNTER — Telehealth (HOSPITAL_COMMUNITY): Payer: Self-pay | Admitting: *Deleted

## 2021-09-06 DIAGNOSIS — Z9689 Presence of other specified functional implants: Secondary | ICD-10-CM | POA: Diagnosis present

## 2021-09-06 NOTE — Telephone Encounter (Signed)
Received telephone call from patient requesting that pleurex catheter be removed.  States that he has developed an allergic reaction to the tape and it is very irritating.  States that there is minimal drainage in tubing, since procedure on 5/15, described at being bloody. No difficulty with breathing at this time.  After speaking with Dr. Valeta Harms, he has agreed to have his office contact patient to schedule removal for this week.  Patient made aware.

## 2021-09-06 NOTE — Telephone Encounter (Signed)
PCCM:  Orders placed for removal of indwelling pleural catheter.  I can do this on Wednesday Thursday or Friday afternoon of this week [place] endoscopy.  Garner Nash, DO [facility] 09/06/2021 2:22 PM

## 2021-09-06 NOTE — Addendum Note (Signed)
Addended by: Garner Nash on: 09/06/2021 02:23 PM   Modules accepted: Orders

## 2021-09-06 NOTE — Telephone Encounter (Signed)
Pt has been scheduled for 5/26 at 1:00.  I called pt & gave him appt info.

## 2021-09-10 ENCOUNTER — Encounter (HOSPITAL_COMMUNITY): Payer: Self-pay | Admitting: Internal Medicine

## 2021-09-10 ENCOUNTER — Encounter (HOSPITAL_COMMUNITY)
Admission: RE | Disposition: A | Discharge status: Home / Self Care | Payer: Self-pay | Source: Home / Self Care | Attending: Internal Medicine

## 2021-09-10 ENCOUNTER — Ambulatory Visit (HOSPITAL_COMMUNITY)
Admission: RE | Admit: 2021-09-10 | Discharge: 2021-09-10 | Disposition: A | Discharge status: Home / Self Care | Payer: Medicare HMO | Attending: Internal Medicine | Admitting: Internal Medicine

## 2021-09-10 DIAGNOSIS — C3491 Malignant neoplasm of unspecified part of right bronchus or lung: Secondary | ICD-10-CM | POA: Insufficient documentation

## 2021-09-10 DIAGNOSIS — Z9689 Presence of other specified functional implants: Secondary | ICD-10-CM | POA: Diagnosis not present

## 2021-09-10 DIAGNOSIS — Z4682 Encounter for fitting and adjustment of non-vascular catheter: Secondary | ICD-10-CM | POA: Insufficient documentation

## 2021-09-10 HISTORY — PX: CHEST TUBE INSERTION: SHX231

## 2021-09-10 SURGERY — INSERTION, PLEURAL DRAINAGE CATHETER
Anesthesia: Topical

## 2021-09-10 MED ORDER — LIDOCAINE HCL (PF) 1 % IJ SOLN
INTRAMUSCULAR | Status: AC
  Filled 2021-09-10: qty 30

## 2021-09-10 NOTE — Interval H&P Note (Signed)
History and Physical Interval Note:  09/10/2021 2:12 PM  Thomas [name]  has presented today for surgery, with the diagnosis of IPC in place.  The various methods of treatment have been discussed with the patient and family. After consideration of risks, benefits and other options for treatment, the patient has consented to  Procedure(s) with comments: REMOVAL PLEURAL DRAINAGE CATHETER (N/A) - removal of IPC as a surgical intervention.  The patient's history has been reviewed, patient examined, no change in status, stable for surgery.  I have reviewed the patient's chart and labs.  Questions were answered to the patient's satisfaction.     [place]

## 2021-09-10 NOTE — Op Note (Signed)
PleurX Removal Procedure Note  Thomas [name]  [id number]  [birth year]  Date:09/10/21  Time:1:42 PM   Provider Performing:Sol Odor L Emanuelle Hammerstrom  Procedure: PleurX Tunneled Pleural Catheter removal  Indication(s) Patient request tube to be removed  Consent Risks of the procedure as well as the alternatives and risks of each were explained to the patient and/or caregiver.  Consent for the procedure was obtained.   Anesthesia Topical only with 1% lidocaine    Time Out Verified patient identification, verified procedure, site/side was marked, verified correct patient position, special equipment/implants available, medications/allergies/relevant history reviewed, required imaging and test results available.  Sterile Technique Maximal sterile technique including sterile barrier drape, hand hygiene, sterile gown, sterile gloves, mask, hair covering.  Procedure Description The area around the tube insertion was cleaned with chlorhexidine.  Using a 25-gauge needle 8 cc of 1% lidocaine was injected around the cuff.  Using tweezers the cuff was loosened from the subcutaneous fat.  Blunt retraction of the tube was necessary for removal.  The entire indwelling pleural catheter was removed from the patient's chest cavity and the patient tolerated the procedure well.  A occlusive dressing was applied with paper tape.  Complications/Tolerance None; patient tolerated the procedure well. No chest x-ray needed   EBL Minimal   Specimen(s) none  Garner Nash, DO [facility] 09/10/2021 1:45 PM

## 2021-09-14 ENCOUNTER — Encounter (HOSPITAL_COMMUNITY): Payer: Self-pay | Admitting: Pulmonary Disease

## 2021-09-17 ENCOUNTER — Encounter: Payer: Self-pay | Admitting: Internal Medicine

## 2021-09-17 ENCOUNTER — Ambulatory Visit (INDEPENDENT_AMBULATORY_CARE_PROVIDER_SITE_OTHER): Payer: Medicare HMO | Admitting: Internal Medicine

## 2021-09-17 DIAGNOSIS — J9611 Chronic respiratory failure with hypoxia: Secondary | ICD-10-CM | POA: Diagnosis not present

## 2021-09-17 DIAGNOSIS — F17210 Nicotine dependence, cigarettes, uncomplicated: Secondary | ICD-10-CM | POA: Diagnosis not present

## 2021-09-17 DIAGNOSIS — J9612 Chronic respiratory failure with hypercapnia: Secondary | ICD-10-CM

## 2021-09-17 DIAGNOSIS — J449 Chronic obstructive pulmonary disease, unspecified: Secondary | ICD-10-CM | POA: Diagnosis not present

## 2021-09-17 DIAGNOSIS — J90 Pleural effusion, not elsewhere classified: Secondary | ICD-10-CM | POA: Diagnosis not present

## 2021-09-17 NOTE — Progress Notes (Unsigned)
Thomas [name], male    DOB: 07-Mar-1956     MRN: [id number]   Brief patient profile:  66   yowm   active smoker 15 afford maint rx   Admit date: 04/27/2020 Discharge date: 05/05/2020   Recommendations for Outpatient Follow-up:  Follow up with PCP in 1-2 weeks Follow-up BMP and CBC in 1 week Follow-up with pulmonology outpatient in 2 weeks for outpatient PFTs and set up for sleep study Continue on home oxygen as prescribed 4 L 24/7 Prednisone taper over 2 weeks as prescribed Continue now on increased dose of Lasix 40 mg twice daily Continue other home medications as prior   Equipment/Devices: Home oxygen 4 L nasal cannula     Brief/Interim Summary: Thomas [name] is a 66 y.o. male with medical history significant for systolic CHF/ischemic cardiomyopathy, COPD, diabetes mellitus. Patient presented to the ED with complaints of increasing difficulty breathing over the past 2 months, productive cough.  Patient was initially admitted with acute hypoxemic respiratory failure that appears multifactorial in the setting of pneumonia and COPD as well as acute on chronic heart failure.  Unfortunately, he has had worsening oxygen saturations and dyspnea noted overnight on 1/11 prompting transfer to ICU and placement on BiPAP.  He has been weaned off BiPAP and is currently requiring 4 L nasal cannula oxygen. Patient was treated for COPD as well as acute on chronic diastolic congestive heart failure exacerbation and has had excellent diuresis during this admission of near 20L-this is course, took several days. He is down to a weight of 269 pounds and his usual baseline at home is around 280. His renal function has remained stable and he has completed his antibiotic course for pneumonia. . He has had PT evaluation with no home recommendations. He will require 4 L home nasal cannula oxygen which will be arranged. His home Lasix dose will be increased to 40 mg twice daily from 20 mg twice daily. His daughter  who stays with him is a nurse will ensure that he follows his appropriate diet and weighs himself daily. Pulmonology to follow-up with him outpatient to consider trilogy machine.   Discharge Diagnoses:  Principal Problem:   PNA (pneumonia) Active Problems:   Morbid obesity ([place])   Ischemic cardiomyopathy   Tobacco abuse   DM (diabetes mellitus) ([place])   Acute respiratory failure with hypoxia and hypercapnia (HCC)   Acute on chronic systolic CHF (congestive heart failure) (HCC)   Acute respiratory failure with hypoxia (HCC)   Acute on chronic respiratory failure with hypoxia and hypercapnia (HCC)   Principal discharge diagnosis: Acute on chronic hypoxemic and hypercapnic respiratory failure-multifactorial in setting of acute on chronic diastolic CHF exacerbation as well as COPD exacerbation with pneumonia.     History of Present Illness  06/22/2020  Pulmonary/ 1st office eval/ Leiah Giannotti / [facility]  Chief Complaint  Patient presents with   Pulmonary Consult    Referred by Dr. Manuella Ghazi. Pt states hospitalized for PNA  Jan 2022. He denies having any SOB. He states "I always have a cough, I smoke".  He was placed on supplemental o2 after recent hospitalization. He is using his albuterol inhaler 2 x per wk on average.   Dyspnea:  [employer] sev aisles and then R>L knee pain limiting  Cough: nasal congestion in am despite water in chamber Sleep: on R side bed is flat/ 3 pillows  SABA use: rarely needing  rec Plan A = Automatic = Always=    Trelegy 100  One cick each  Am - two good drags and out thru the nose  Plan B = Backup (to supplement plan A, not to replace it) Only use your albuterol inhaler as a rescue medication Please schedule a follow up office visit in 6 weeks, call sooner if needed with PFTs - bring inhalers and your drug formulary (provided by your insurance company, not me)    08/21/2020  f/u ov/[place] office/Glorene Leitzke re: copd ? Stage/ hypercarbic ? OHS/still smoking  Chief  Complaint  Patient presents with   Follow-up    No complaints currently    Dyspnea:  Limited more by knee than breathing  Cough: worse after eating, sometimes chokes  Sleeping: flat bed on side 3 pillows / says feels fine in am  SABA use: once or twice for cough  02: 02 3lpm hs and prn  Covid status: vax x 3  Lung cancer screening: last CT 12/19/2018 > referred for [facility] shared decision making Pain under L rib better p flatus x 2011 never supine  Rec I will refer you today to [name] who runs the lung cancer screening program for shared decision making > pos RLL endobronchial dz > referred to [name]  Classic subdiaphragmatic pain pattern suggests ibs:  Treatment consists of avoiding foods that cause gas (especially boiled eggs, mexcican food but especially  beans and undercooked vegetables like  spinach and some salads)  and citrucel 1 heaping tsp twice daily with a large glass of water.  Pain should improve w/in 2 weeks and if not then consider further GI work up.   Be sure you take omeprazole 40 mg Take 30-60 min before first meal of the day  Only use your albuterol as a rescue medication The key is to stop smoking completely before smoking completely stops you! 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   SOB and cough have been worse since doing biopsy and during radiation treatments   Wants to discuss getting an inogen machine.   Dyspnea:  knee still slowing him down/  hc parking/ mb and back to house with 02 2lpm pulsing sats 89%  more of  incline toward the mailbox  Cough: none  Sleeping: flat bed 3 pillows  SABA use: as above  02: 2lpm hs and pulse  Covid status: vax x 3  RT thru Feb 23 2021 / no chemo  Rec Make sure you check your oxygen saturation  at your highest level of activity   Work on inhaler technique:    Ok to try albuterol 15 min before an activity (on alternating days with the neb )  that you know would usually make you short of breath  Please schedule a follow up visit in 3 months with PFTs on return > not done     05/31/2021  post f/u ov/[place] office/Aubry Tucholski re: GOLD 0/chf on 02  maint on 03 3lpm 24/7  Chief Complaint  Patient presents with  Follow-up    Hosp from 05/23/21-05/26/2021 for CHF at [facility] to discuss portable tank    Dyspnea:  room to room / not trying mailbox yet x 50 ft falt  Cough: better  Sleeping: flat bed on side 2 pillows  SABA use: neither hfa nor neb seem to help vs just sitting down and resting but does not pre or re challenge as rec  02: 3lpm  24/7 but says he was told he needed bipap for hypercarbia Covid status: vax x 4  Rec Make sure you check your oxygen saturation  AT  your highest level of activity (not after you stop)   to be sure it stays around  90%  Schedule best fit evaluation for portable 02   Schedule sleep medicine evaluation here next available consult slot      09/17/2021  f/u ov/[place] office/Aldred Mase re: GOLD 4 maint on prn albuterol   Chief Complaint  Patient presents with   Follow-up    Has been breathing heavier   2LO2 pulse   Dyspnea:  able to do more than was MB qod downhill and sometimes without 02  Cough: minimal / assoc with rhintis / watery  Sleeping: flat bed R side/ 2 pillows  SABA use: one week hfa/ no neb  02: 2lpm hs / adjusts to keep      No obvious day to day or daytime variability or assoc excess/ purulent sputum or mucus plugs or hemoptysis or cp or chest tightness, subjective wheeze or overt sinus or hb symptoms.    *** without nocturnal  or early am exacerbation  of respiratory  c/o's or need for noct saba. Also denies any obvious fluctuation of symptoms with weather or environmental changes or other aggravating or alleviating factors except as outlined above   No unusual exposure hx or h/o childhood pna/ asthma or knowledge of premature birth.  Current Allergies, Complete Past Medical History, Past Surgical History, Family History, and Social History were reviewed in [employer] record.  ROS  The following are not active complaints unless bolded Hoarseness, sore throat, dysphagia, dental problems, itching, sneezing,  nasal congestion or discharge of excess mucus or purulent secretions, ear ache,   fever, chills, sweats, unintended wt loss or wt gain, classically pleuritic or exertional cp,  orthopnea pnd or arm/hand swelling  or leg swelling, presyncope, palpitations, abdominal pain, anorexia, nausea, vomiting, diarrhea  or change in bowel habits or change in bladder habits, change in stools or change in urine, dysuria, hematuria,  rash, arthralgias, visual complaints, headache, numbness, weakness or ataxia or problems with walking or coordination,  change in mood or  memory.        Current Meds  Medication Sig   albuterol (PROVENTIL) (2.5 MG/3ML) 0.083% nebulizer solution Take 3 mLs (2.5 mg total) by nebulization every 6 (six) hours as needed for wheezing or shortness of breath.   albuterol (VENTOLIN HFA) 108 (90 Base) MCG/ACT inhaler Inhale 2 puffs into the lungs every 6 (six) hours as needed for wheezing or shortness of breath.   aspirin EC 81 MG tablet Take 81 mg by mouth daily.   atorvastatin (LIPITOR) 80 MG tablet TAKE ONE TABLET BY MOUTH ONCE DAILY AT 6 PM. (Patient taking differently: Take 80 mg by mouth daily.)   bismuth subsalicylate (PEPTO BISMOL) 262 MG/15ML suspension Take 30 mLs by mouth every 6 (six) hours as needed.   canagliflozin (INVOKANA) 300 MG TABS tablet Take  300 mg by mouth daily  before breakfast.   carvedilol (COREG) 12.5 MG tablet TAKE ONE TABLET BY MOUTH TWICE DAILY   clopidogrel (PLAVIX) 75 MG tablet TAKE ONE TABLET BY MOUTH DAILY WITH BREAKFAST (Patient taking differently: Take 75 mg by mouth daily with breakfast.)   docusate sodium (COLACE) 100 MG capsule Take 300 mg by mouth daily.   furosemide (LASIX) 20 MG tablet Take 1 tablet (20 mg total) by mouth daily.   ibuprofen (ADVIL) 200 MG tablet Take 600 mg by mouth 2 (two) times daily.   isosorbide mononitrate (IMDUR) 60 MG 24 hr tablet TAKE ONE TABLET BY MOUTH EVERY DAY (Patient taking differently: Take 60 mg by mouth daily.)   ketoconazole (NIZORAL) 2 % cream Apply 1 application topically daily as needed for irritation.   metFORMIN (GLUCOPHAGE-XR) 500 MG 24 hr tablet Take 1,000 mg by mouth 2 (two) times daily.   nitroGLYCERIN (NITROSTAT) 0.4 MG SL tablet DISSOLVE ONE TABLET UNDER TONGUE EVERY 5 MINUTES UP TO 3 DOSES AS NEEDED FOR CHEST PAIN (Patient taking differently: Place 0.4 mg under the tongue every 5 (five) minutes as needed for chest pain.)   omeprazole (PRILOSEC) 40 MG capsule Take 40 mg by mouth daily.   polyethylene glycol (MIRALAX / GLYCOLAX) 17 g packet Take 17 g by mouth daily as needed for moderate constipation.   potassium chloride (KLOR-CON) 10 MEQ tablet Take 1 tablet (10 mEq total) by mouth daily.   sertraline (ZOLOFT) 50 MG tablet Take 1 tablet (50 mg total) by mouth daily.                    Past Medical History:  Diagnosis Date   CHF (congestive heart failure) (HCC)    COPD (chronic obstructive pulmonary disease) (HCC)    Coronary artery disease    Morbid obesity ([place])    NSTEMI (non-ST elevated myocardial infarction) ([place])    a. s/p cath in 03/2018 showing 90% OM2 stenosis and nonobstructive CAD along LAD and LCx with medical management recommended   Pneumonia    Secondary cardiomyopathy ([place])    a. EF reported as 20-25% by echo in 03/2018 b. EF at 45% by  repeat imaging in 10/2018   Snoring    Type 2 diabetes mellitus (HCC)       Objective:    Wts  09/17/2021         257   05/31/2021       260  01/25/2021     270   08/21/20 283 lb 9.6 oz (128.6 kg)  08/18/20 284 lb (128.8 kg)  06/22/20 284 lb (128.8 kg)     Vital signs reviewed  09/17/2021  - Note at rest 02 sats  87% on 2lpm pulsed on arrival   General appearance:    bruff voice/ amb obese wm nad   HEENT :  Oropharynx  ***  Nasal turbintes ***   NECK :  without JVD/Nodes/TM/ nl carotid upstrokes bilaterally   LUNGS: no acc muscle use,  Mod barrel  contour chest wall with decreased bs/ dullness 1/3 on R and  without cough on insp or exp maneuvers and mod  Hyperresonant  to  percussion bilaterally     CV:  RRR  no s3 or murmur or increase in P2, and no edema   ABD:  obese soft and nontender with pos mid insp Hoover's  in the supine position. No bruits or organomegaly appreciated, bowel sounds nl  MS:   Ext warm without deformities or   obvious  joint restrictions , calf tenderness, cyanosis or clubbing  SKIN: warm and dry without lesions    NEURO:  alert, approp, nl sensorium with  no motor or cerebellar deficits apparent.             Cxr 09/17/2021 pt declined, agrees to have done on return 6/6              Assessment

## 2021-09-17 NOTE — Patient Instructions (Addendum)
Make sure you check your oxygen saturation  AT  your highest level of activity (not after you stop)   to be sure it stays over 90% and adjust  02 flow upward to maintain this level if needed but remember to turn it back to previous settings when you stop (to conserve your supply).   Stop all smoking   Get your cxr before your visit next week with Dr Halford Chessman   Please schedule a follow up visit in 6 weeks  but call sooner if needed - bring your drug formulary with you - best option is spiriva if looking for a single medication

## 2021-09-18 ENCOUNTER — Encounter: Payer: Self-pay | Admitting: Internal Medicine

## 2021-09-18 NOTE — Assessment & Plan Note (Signed)
HC03  05/05/20  = 40  -  06/22/2020   Walked RA  approx   300 ft  @ moderate pace  stopped due to  A leg and back pain/no SOB with sats still 90% at end    -  08/21/2020   Walked RA  approx   300 ft  @ slow pace  stopped due to  Tired/ sats still 87% RA -  01/25/2021   Walked on 2lpm cont x  3  lap(s) =  approx 450 @ slow pace, stopped due to end of study  with lowest 02 sats  89% so rec for inogen 2lpm  - - HC03    05/26/21  = 44 > referred to sleep medicine 06/01/2021  - 09/17/2021  on 2LO2 pulse patient was at 87% at rest. Increased O2 to 3LO2 pulse and patients sats were at 92% walked at a slow pace and stopped after  150 ft due to being tired and SOB  Advised to keep appt for sleep medicine and in meantime: Make sure you check your oxygen saturation  AT  your highest level of activity (not after you stop)   to be sure it stays over 90% and adjust  02 flow upward to maintain this level if needed but remember to turn it back to previous settings when you stop (to conserve your supply).

## 2021-09-18 NOTE — Assessment & Plan Note (Signed)
Quit smoking 03/2021  06/22/2020  After extensive coaching inhaler device,  effectiveness =    90% with elipta > try trelegy > no better as of 08/21/2020 so just rec prn saba  - - The proper method of use, as well as anticipated side effects, of a metered-dose inhaler were discussed and demonstrated to the patient using teach back method. Improved effectiveness after extensive coaching during this visit to a level of approximately 75 % from a baseline of < 50 %  So continue saba hfa and back up with neb as says can't afford maint rx  - PFT's  06/21/2021   FEV1 0.93 (27% ) ratio 0.58  p 12 % improvement from saba p 0 prior to study with DLCO  9.57 (36%) corrects to 3.16 (86%)  for alv volume and FV curve atypical exp portion and ERV 21% at wt 261      Pt is Group B in terms of symptom/risk and laba/lama therefore appropriate rx at this point >>>  stiolto best choice though he says his insurance won't cover any "combination inhalers"  - I suspect he means laba/ics  - but appears to be doing ok on just prn saba so explained formulary restrictions and encouraged return with formulary to chose best coverage.

## 2021-09-18 NOTE — Assessment & Plan Note (Addendum)
Counseled re importance of smoking cessation but did not meet time criteria for separate billing         Each maintenance medication was reviewed in detail including emphasizing most importantly the difference between maintenance and prns and under what circumstances the prns are to be triggered using an action plan format where appropriate.  Total time for H and P, chart review, counseling, reviewing hfa/02/neb device(s) , directly observing portions of ambulatory 02 saturation study/ and generating customized AVS unique to this office visit / same day charting = 31 min

## 2021-09-19 NOTE — Assessment & Plan Note (Addendum)
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

## 2021-09-21 ENCOUNTER — Ambulatory Visit (HOSPITAL_COMMUNITY)
Admission: RE | Admit: 2021-09-21 | Discharge: 2021-09-21 | Disposition: A | Discharge status: Home / Self Care | Payer: Medicare HMO | Source: Ambulatory Visit | Attending: Internal Medicine | Admitting: Internal Medicine

## 2021-09-21 ENCOUNTER — Ambulatory Visit (INDEPENDENT_AMBULATORY_CARE_PROVIDER_SITE_OTHER): Payer: Medicare HMO | Admitting: Pulmonary Disease

## 2021-09-21 ENCOUNTER — Encounter: Payer: Self-pay | Admitting: Pulmonary Disease

## 2021-09-21 VITALS — BP 134/72 | HR 74 | Temp 98.9°F | Ht 69.0 in | Wt 263.2 lb

## 2021-09-21 DIAGNOSIS — J449 Chronic obstructive pulmonary disease, unspecified: Secondary | ICD-10-CM | POA: Diagnosis present

## 2021-09-21 DIAGNOSIS — J90 Pleural effusion, not elsewhere classified: Secondary | ICD-10-CM | POA: Diagnosis not present

## 2021-09-21 NOTE — Progress Notes (Signed)
[facility]  Chief Complaint  Patient presents with   Follow-up    CXR done today at 9:30. Was advised to keep appt by Dr. Melvyn Novas after seeing him last week and to go over cxr results today during appt.       Past Surgical History:  He  has a past surgical history that includes LEFT HEART CATH AND CORONARY ANGIOGRAPHY (N/A, 03/30/2018); Cholecystectomy; Tonsillectomy; Shoulder arthroscopy (Bilateral); Wisdom tooth extraction; Colonoscopy; Upper gi endoscopy; Video bronchoscopy (Right, 10/30/2020); Bronchial biopsy (10/30/2020); Bronchial washings (10/30/2020); Bronchial brushings (10/30/2020); Endobronchial ultrasound (10/30/2020); IR PERC PLEURAL DRAIN W/INDWELL CATH W/IMG GUIDE (08/06/2021); Video bronchoscopy (N/A, 08/30/2021); Bronchial brushings (08/30/2021); and Chest tube insertion (N/A, 09/10/2021).  Past Medical History:  OA, CHF, CAD, Fatty liver, GERD, Neuropathy, Pneumonia, DM type 2, NSCLC  Constitutional:  BP 134/72 (BP Location: Left Arm, Patient Position: Sitting)   Pulse 74   Temp 98.9 F (37.2 C) (Temporal)   Ht 5\' 9"  (1.753 m)   Wt 263 lb 3.2 oz (119.4 kg)   SpO2 92% Comment: 3LO2 pulse  BMI 38.87 kg/m   Brief Summary:  Thomas [name] is a 66 y.o. male smoker with snoring and COPD.       Subjective:   He had pleurx put in by IR.  Was allergic to tape and had pleurx removed.  Had bronchoscopy with endobronchial tumor debulking by Dr. Valeta Harms.  Breathing feels okay.  Has Lt but not Rt sided chest pain.  Not having sputum, or hemoptysis.  No fever, or leg swelling.  Using 3 liters oxygen.  Uses albuterol prn.  Doesn't feel like he needs anything extra.  Sleeping okay.  Physical Exam:   Appearance - well kempt, wearing oxygen  ENMT - no sinus tenderness, no oral exudate, no LAN, Mallampati 3 airway, no stridor  Respiratory - decreased breath sounds bilaterally, no wheezing or rales  CV - s1s2 regular rate and rhythm, no  murmurs  Ext - no clubbing, no edema  Skin - no rashes  Psych - normal mood and affect    Pulmonary testing:  Lt thoracentesis 12/08/20 >> 1.2 liters yellow fluid, reactive mesothelial cells ABG 05/24/21 >> pH 7.49, PCO2 56 ,PO2 59 PFT 06/21/21 >> FEV1 0.93 (27%), FEV1% 58, TLC 5/74 (69%), DLCO 36%, +BD  Chest Imaging:  LDCT chest 10/08/20 >> atherosclerosis, mod Rt effusion, centrilobular emphysema, occlusion of bronchus intermedius, scattered nodules CT chest 06/10/21 >> 1.1 cm endobronchial lesion in bronchus intermedius, RML/RLL collapse, mod/large Rt pleural effusion  Sleep Tests:    Cardiac Tests:  Echo 08/19/21 >> EF 55 to 60%, mild LVH  Social History:  He  reports that he has been smoking cigarettes. He started smoking about 56 years ago. He has a 27.50 pack-year smoking history. He has never used smokeless tobacco. He reports that he does not currently use alcohol. He reports current drug use. Drug: Marijuana.  Family History:  His family history includes Emphysema in his father; Heart failure in his father.     Assessment/Plan:   COPD with emphysema. - will have his PFT rescheduled to [facility] office to expedite set up - continue prn albuterol - he will follow up with Dr. Melvyn Novas  Recurrent Rt pleural effusion. - he had allergic reaction to tape used with pleurx catheter, and wouldn't want another one - prn thoracentesis for dyspnea; not needed at present  Chronic hypoxic, hypercapnic respiratory failure. - 3 liters O2 24/7 - he is not  interested in additional assessment for sleep disordered breathing at this time  Tobacco abuse. 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 Delton Coombes oncology and Dr. Lynnette Caffey with radiation oncology  Coronary artery disease, history of ischemic cardiomyopathy. - followed by Dr. Domenic Polite with cardiology - advised if chest pain recurs he should  contact cardiology or his PCP, or go to the hospital  Time Spent Involved in Patient Care on Day of Examination:  27 minutes  Follow up:   Patient Instructions  Follow up with Dr. Melvyn Novas in July as previously scheduled  Medication List:   Allergies as of 09/21/2021       Reactions   Glipizide Palpitations, Other (See Comments)   Codeine Nausea And Vomiting, Other (See Comments)   Doxycycline Other (See Comments)   GI-Upset        Medication List        Accurate as of September 21, 2021 11:05 AM. If you have any questions, ask your nurse or doctor.          albuterol 108 (90 Base) MCG/ACT inhaler Commonly known as: VENTOLIN HFA Inhale 2 puffs into the lungs every 6 (six) hours as needed for wheezing or shortness of breath.   albuterol (2.5 MG/3ML) 0.083% nebulizer solution Commonly known as: PROVENTIL Take 3 mLs (2.5 mg total) by nebulization every 6 (six) hours as needed for wheezing or shortness of breath.   aspirin EC 81 MG tablet Take 81 mg by mouth daily.   atorvastatin 80 MG tablet Commonly known as: LIPITOR TAKE ONE TABLET BY MOUTH ONCE DAILY AT 6 PM. What changed: See the new instructions.   bismuth subsalicylate 643 PI/95JO suspension Commonly known as: PEPTO BISMOL Take 30 mLs by mouth every 6 (six) hours as needed.   canagliflozin 300 MG Tabs tablet Commonly known as: INVOKANA Take 300 mg by mouth daily before breakfast.   carvedilol 12.5 MG tablet Commonly known as: COREG TAKE ONE TABLET BY MOUTH TWICE DAILY   clopidogrel 75 MG tablet Commonly known as: PLAVIX TAKE ONE TABLET BY MOUTH DAILY WITH BREAKFAST   docusate sodium 100 MG capsule Commonly known as: COLACE Take 300 mg by mouth daily.   furosemide 20 MG tablet Commonly known as: Lasix Take 1 tablet (20 mg total) by mouth daily.   ibuprofen 200 MG tablet Commonly known as: ADVIL Take 600 mg by mouth 2 (two) times daily.   isosorbide mononitrate 60 MG 24 hr tablet Commonly known as:  IMDUR TAKE ONE TABLET BY MOUTH EVERY DAY   ketoconazole 2 % cream Commonly known as: NIZORAL Apply 1 application topically daily as needed for irritation.   metFORMIN 500 MG 24 hr tablet Commonly known as: GLUCOPHAGE-XR Take 1,000 mg by mouth 2 (two) times daily.   nitroGLYCERIN 0.4 MG SL tablet Commonly known as: NITROSTAT DISSOLVE ONE TABLET UNDER TONGUE EVERY 5 MINUTES UP TO 3 DOSES AS NEEDED FOR CHEST PAIN What changed: See the new instructions.   omeprazole 40 MG capsule Commonly known as: PRILOSEC Take 40 mg by mouth daily.   polyethylene glycol 17 g packet Commonly known as: MIRALAX / GLYCOLAX Take 17 g by mouth daily as needed for moderate constipation.   potassium chloride 10 MEQ tablet Commonly known as: KLOR-CON Take 1 tablet (10 mEq total) by mouth daily.   sertraline 50 MG tablet Commonly known as: ZOLOFT Take 1 tablet (50 mg total) by mouth daily.  Signature:  Chesley Mires, MD [place] Pager - [phone] 09/21/2021, 11:05 AM

## 2021-09-21 NOTE — Patient Instructions (Signed)
Follow up with Dr. Melvyn Novas in July as previously scheduled

## 2021-10-11 ENCOUNTER — Other Ambulatory Visit (HOSPITAL_COMMUNITY): Payer: Self-pay

## 2021-10-11 ENCOUNTER — Emergency Department (HOSPITAL_COMMUNITY): Payer: Medicare HMO

## 2021-10-11 ENCOUNTER — Encounter (HOSPITAL_COMMUNITY): Payer: Self-pay | Admitting: Emergency Medicine

## 2021-10-11 ENCOUNTER — Other Ambulatory Visit: Payer: Self-pay

## 2021-10-11 ENCOUNTER — Observation Stay (HOSPITAL_COMMUNITY)
Admission: EM | Admit: 2021-10-11 | Discharge: 2021-10-12 | Disposition: A | Discharge status: Home / Self Care | Payer: Medicare HMO | Attending: Internal Medicine | Admitting: Internal Medicine

## 2021-10-11 DIAGNOSIS — Z79899 Other long term (current) drug therapy: Secondary | ICD-10-CM | POA: Diagnosis not present

## 2021-10-11 DIAGNOSIS — J9601 Acute respiratory failure with hypoxia: Secondary | ICD-10-CM | POA: Diagnosis not present

## 2021-10-11 DIAGNOSIS — J90 Pleural effusion, not elsewhere classified: Secondary | ICD-10-CM | POA: Diagnosis not present

## 2021-10-11 DIAGNOSIS — I2510 Atherosclerotic heart disease of native coronary artery without angina pectoris: Secondary | ICD-10-CM | POA: Insufficient documentation

## 2021-10-11 DIAGNOSIS — Z720 Tobacco use: Secondary | ICD-10-CM | POA: Diagnosis present

## 2021-10-11 DIAGNOSIS — I110 Hypertensive heart disease with heart failure: Secondary | ICD-10-CM | POA: Diagnosis not present

## 2021-10-11 DIAGNOSIS — E119 Type 2 diabetes mellitus without complications: Secondary | ICD-10-CM | POA: Insufficient documentation

## 2021-10-11 DIAGNOSIS — C3490 Malignant neoplasm of unspecified part of unspecified bronchus or lung: Secondary | ICD-10-CM

## 2021-10-11 DIAGNOSIS — J440 Chronic obstructive pulmonary disease with acute lower respiratory infection: Secondary | ICD-10-CM | POA: Diagnosis not present

## 2021-10-11 DIAGNOSIS — J189 Pneumonia, unspecified organism: Secondary | ICD-10-CM | POA: Insufficient documentation

## 2021-10-11 DIAGNOSIS — F17210 Nicotine dependence, cigarettes, uncomplicated: Secondary | ICD-10-CM | POA: Insufficient documentation

## 2021-10-11 DIAGNOSIS — R0602 Shortness of breath: Secondary | ICD-10-CM | POA: Diagnosis present

## 2021-10-11 DIAGNOSIS — Z7902 Long term (current) use of antithrombotics/antiplatelets: Secondary | ICD-10-CM | POA: Diagnosis not present

## 2021-10-11 DIAGNOSIS — Z7982 Long term (current) use of aspirin: Secondary | ICD-10-CM | POA: Insufficient documentation

## 2021-10-11 DIAGNOSIS — Z85118 Personal history of other malignant neoplasm of bronchus and lung: Secondary | ICD-10-CM | POA: Diagnosis not present

## 2021-10-11 DIAGNOSIS — I5032 Chronic diastolic (congestive) heart failure: Secondary | ICD-10-CM | POA: Insufficient documentation

## 2021-10-11 DIAGNOSIS — Z7984 Long term (current) use of oral hypoglycemic drugs: Secondary | ICD-10-CM | POA: Diagnosis not present

## 2021-10-11 LAB — GLUCOSE, CAPILLARY: Glucose-Capillary: 165 mg/dL — ABNORMAL HIGH (ref 70–99)

## 2021-10-11 LAB — BASIC METABOLIC PANEL
Anion gap: 8 (ref 5–15)
BUN: 24 mg/dL — ABNORMAL HIGH (ref 8–23)
CO2: 34 mmol/L — ABNORMAL HIGH (ref 22–32)
Calcium: 9 mg/dL (ref 8.9–10.3)
Chloride: 98 mmol/L (ref 98–111)
Creatinine, Ser: 0.64 mg/dL (ref 0.61–1.24)
GFR, Estimated: >60 mL/min (ref >60)
Glucose, Bld: 189 mg/dL — ABNORMAL HIGH (ref 70–99)
Potassium: 3.9 mmol/L (ref 3.5–5.1)
Sodium: 140 mmol/L (ref 135–145)

## 2021-10-11 LAB — CBC
HCT: 42.5 % (ref 39.0–52.0)
Hemoglobin: 12.8 g/dL — ABNORMAL LOW (ref 13.0–17.0)
MCH: 27 pg (ref 26.0–34.0)
MCHC: 30.1 g/dL (ref 30.0–36.0)
MCV: 89.7 fL (ref 80.0–100.0)
Platelets: 233 10*3/uL (ref 150–400)
RBC: 4.74 MIL/uL (ref 4.22–5.81)
RDW: 14.5 % (ref 11.5–15.5)
WBC: 6.6 10*3/uL (ref 4.0–10.5)
nRBC: 0 % (ref 0.0–0.2)

## 2021-10-11 MED ORDER — ACETAMINOPHEN 650 MG RE SUPP: 650.0000 mg | Freq: Four times a day (QID) | RECTAL | Status: DC | PRN

## 2021-10-11 MED ORDER — INSULIN ASPART 100 UNIT/ML IJ SOLN: 0.0000 [IU] | Freq: Every day | INTRAMUSCULAR | Status: DC

## 2021-10-11 MED ORDER — ISOSORBIDE MONONITRATE ER 60 MG PO TB24
60.0000 mg | ORAL_TABLET | Freq: Every day | ORAL | Status: DC
  Administered 2021-10-12: 60 mg via ORAL
  Filled 2021-10-11: qty 1

## 2021-10-11 MED ORDER — FENTANYL CITRATE PF 50 MCG/ML IJ SOSY: 12.5000 ug | PREFILLED_SYRINGE | INTRAMUSCULAR | Status: DC | PRN

## 2021-10-11 MED ORDER — OXYCODONE HCL 5 MG PO TABS: 5.0000 mg | ORAL_TABLET | ORAL | Status: DC | PRN

## 2021-10-11 MED ORDER — ALBUTEROL SULFATE (2.5 MG/3ML) 0.083% IN NEBU: 2.5000 mg | INHALATION_SOLUTION | RESPIRATORY_TRACT | Status: DC | PRN

## 2021-10-11 MED ORDER — PANTOPRAZOLE SODIUM 40 MG PO TBEC
40.0000 mg | DELAYED_RELEASE_TABLET | Freq: Every day | ORAL | Status: DC
  Administered 2021-10-12: 40 mg via ORAL
  Filled 2021-10-11: qty 1

## 2021-10-11 MED ORDER — VANCOMYCIN HCL 2000 MG/400ML IV SOLN
2000.0000 mg | Freq: Once | INTRAVENOUS | Status: AC
  Administered 2021-10-11: 2000 mg via INTRAVENOUS
  Filled 2021-10-11: qty 400

## 2021-10-11 MED ORDER — POLYETHYLENE GLYCOL 3350 17 G PO PACK: 17.0000 g | PACK | Freq: Every day | ORAL | Status: DC | PRN

## 2021-10-11 MED ORDER — ONDANSETRON HCL 4 MG/2ML IJ SOLN: 4.0000 mg | Freq: Four times a day (QID) | INTRAMUSCULAR | Status: DC | PRN

## 2021-10-11 MED ORDER — SERTRALINE HCL 50 MG PO TABS
50.0000 mg | ORAL_TABLET | Freq: Every day | ORAL | Status: DC
  Administered 2021-10-12: 50 mg via ORAL
  Filled 2021-10-11: qty 1

## 2021-10-11 MED ORDER — ACETAMINOPHEN 325 MG PO TABS: 650.0000 mg | ORAL_TABLET | Freq: Four times a day (QID) | ORAL | Status: DC | PRN

## 2021-10-11 MED ORDER — SODIUM CHLORIDE 0.9 % IV SOLN: 1.0000 g | Freq: Once | INTRAVENOUS | Status: DC

## 2021-10-11 MED ORDER — SODIUM CHLORIDE 0.9 % IV SOLN: 500.0000 mg | Freq: Once | INTRAVENOUS | Status: DC

## 2021-10-11 MED ORDER — ATORVASTATIN CALCIUM 40 MG PO TABS
80.0000 mg | ORAL_TABLET | Freq: Every day | ORAL | Status: DC
  Administered 2021-10-12: 80 mg via ORAL
  Filled 2021-10-11: qty 2

## 2021-10-11 MED ORDER — FUROSEMIDE 10 MG/ML IJ SOLN
40.0000 mg | Freq: Once | INTRAMUSCULAR | Status: AC
  Administered 2021-10-11: 40 mg via INTRAVENOUS
  Filled 2021-10-11: qty 4

## 2021-10-11 MED ORDER — INSULIN ASPART 100 UNIT/ML IJ SOLN
0.0000 [IU] | Freq: Three times a day (TID) | INTRAMUSCULAR | Status: DC
  Administered 2021-10-12: 5 [IU] via SUBCUTANEOUS

## 2021-10-11 MED ORDER — CLOPIDOGREL BISULFATE 75 MG PO TABS
75.0000 mg | ORAL_TABLET | Freq: Every day | ORAL | Status: DC
  Administered 2021-10-12: 75 mg via ORAL
  Filled 2021-10-11: qty 1

## 2021-10-11 MED ORDER — SODIUM CHLORIDE 0.9 % IV SOLN
2.0000 g | Freq: Once | INTRAVENOUS | Status: AC
  Administered 2021-10-11: 2 g via INTRAVENOUS
  Filled 2021-10-11: qty 12.5

## 2021-10-11 MED ORDER — VANCOMYCIN HCL 1250 MG/250ML IV SOLN
1250.0000 mg | Freq: Two times a day (BID) | INTRAVENOUS | Status: DC
  Administered 2021-10-12: 1250 mg via INTRAVENOUS
  Filled 2021-10-11: qty 250

## 2021-10-11 MED ORDER — ONDANSETRON HCL 4 MG PO TABS: 4.0000 mg | ORAL_TABLET | Freq: Four times a day (QID) | ORAL | Status: DC | PRN

## 2021-10-11 MED ORDER — CARVEDILOL 12.5 MG PO TABS
12.5000 mg | ORAL_TABLET | Freq: Two times a day (BID) | ORAL | Status: DC
  Administered 2021-10-12: 12.5 mg via ORAL
  Filled 2021-10-11: qty 1

## 2021-10-11 NOTE — ED Provider Notes (Signed)
[facility] EMERGENCY DEPARTMENT Provider Note   CSN: [id number] Arrival date & time: 10/11/21  1851     History  Chief Complaint  Patient presents with   Shortness of Breath    Thomas [name] is a 66 y.o. male who presents emergency department complaining of shortness of breath for the past 2 days.  Patient has history of lung cancer, has been in remission since November 2022.  He is chronically on 2 L oxygen, but is had to increase it to 4 L due to significant shortness of breath.  Does have history of a right-sided pleural effusion, and has needed a thoracentesis in the past.  Most recently had chest tube in place in May, but it was removed after patient was found to be allergic to the tape that was used.  States he has been coughing more than prior, coughing up yellow phlegm.  Denies any fever.   Shortness of Breath Associated symptoms: cough   Associated symptoms: no chest pain and no fever        Home Medications Prior to Admission medications   Medication Sig Start Date End Date Taking? Authorizing Provider  albuterol (PROVENTIL) (2.5 MG/3ML) 0.083% nebulizer solution Take 3 mLs (2.5 mg total) by nebulization every 6 (six) hours as needed for wheezing or shortness of breath. 10/15/20  Yes Icard, Octavio Graves, DO  aspirin EC 81 MG tablet Take 81 mg by mouth daily.   Yes [provider]  atorvastatin (LIPITOR) 80 MG tablet TAKE ONE TABLET BY MOUTH ONCE DAILY AT 6 PM. Patient taking differently: Take 80 mg by mouth daily. 04/01/21  Yes Satira Sark, MD  canagliflozin (INVOKANA) 300 MG TABS tablet Take 300 mg by mouth daily before breakfast.   Yes [provider]  carvedilol (COREG) 12.5 MG tablet TAKE ONE TABLET BY MOUTH TWICE DAILY 07/01/21  Yes Satira Sark, MD  clopidogrel (PLAVIX) 75 MG tablet TAKE ONE TABLET BY MOUTH DAILY WITH BREAKFAST Patient taking differently: Take 75 mg by mouth daily with breakfast. 04/01/21  Yes Satira Sark, MD   docusate sodium (COLACE) 100 MG capsule Take 300 mg by mouth daily.   Yes [provider]  furosemide (LASIX) 20 MG tablet Take 1 tablet (20 mg total) by mouth daily. 08/02/21  Yes Dunn, Dayna N, PA-C  ibuprofen (ADVIL) 200 MG tablet Take 600 mg by mouth 2 (two) times daily.   Yes [provider]  isosorbide mononitrate (IMDUR) 60 MG 24 hr tablet TAKE ONE TABLET BY MOUTH EVERY DAY Patient taking differently: Take 60 mg by mouth daily. 04/01/21  Yes Satira Sark, MD  ketoconazole (NIZORAL) 2 % cream Apply 1 application topically daily as needed for irritation. 05/15/20  Yes [provider]  metFORMIN (GLUCOPHAGE-XR) 500 MG 24 hr tablet Take 1,000 mg by mouth 2 (two) times daily. 11/25/18  Yes [provider]  nitroGLYCERIN (NITROSTAT) 0.4 MG SL tablet DISSOLVE ONE TABLET UNDER TONGUE EVERY 5 MINUTES UP TO 3 DOSES AS NEEDED FOR CHEST PAIN Patient taking differently: Place 0.4 mg under the tongue every 5 (five) minutes as needed for chest pain. 06/07/21  Yes Satira Sark, MD  omeprazole (PRILOSEC) 40 MG capsule Take 40 mg by mouth daily.   Yes [provider]  potassium chloride (KLOR-CON) 10 MEQ tablet Take 1 tablet (10 mEq total) by mouth daily. 07/27/21  Yes Dunn, Dayna N, PA-C  sertraline (ZOLOFT) 50 MG tablet Take 1 tablet (50 mg total) by mouth  daily. 08/30/21  Yes Icard, Leory Plowman L, DO  albuterol (VENTOLIN HFA) 108 (90 Base) MCG/ACT inhaler Inhale 2 puffs into the lungs every 6 (six) hours as needed for wheezing or shortness of breath.    [provider]      Allergies    Glipizide, Codeine, and Doxycycline    Review of Systems   Review of Systems  Constitutional:  Negative for fever.  Respiratory:  Positive for cough and shortness of breath.   Cardiovascular:  Negative for chest pain.  All other systems reviewed and are negative.   Physical Exam Updated Vital Signs BP (!) 141/57   Pulse 87   Temp 98.2 F (36.8 C) (Oral)    Resp 14   Ht 5\' 9"  (1.753 m)   Wt 116.1 kg   SpO2 94%   BMI 37.80 kg/m  Physical Exam Vitals and nursing note reviewed.  Constitutional:      Appearance: Normal appearance.  HENT:     Head: Normocephalic and atraumatic.  Eyes:     Conjunctiva/sclera: Conjunctivae normal.  Cardiovascular:     Rate and Rhythm: Normal rate and regular rhythm.  Pulmonary:     Effort: Pulmonary effort is normal. Tachypnea and prolonged expiration present. No respiratory distress.     Comments: Decreased breath sounds on the right compared to the left Abdominal:     General: There is no distension.     Palpations: Abdomen is soft.     Tenderness: There is no abdominal tenderness.  Skin:    General: Skin is warm and dry.  Neurological:     General: No focal deficit present.     Mental Status: He is alert.     ED Results / Procedures / Treatments   Labs (all labs ordered are listed, but only abnormal results are displayed) Labs Reviewed  CBC - Abnormal; Notable for the following components:      Result Value   Hemoglobin 12.8 (*)    All other components within normal limits  BASIC METABOLIC PANEL - Abnormal; Notable for the following components:   CO2 34 (*)    Glucose, Bld 189 (*)    BUN 24 (*)    All other components within normal limits    EKG EKG Interpretation  Date/Time:  Monday October 11 2021 19:20:26 EDT Ventricular Rate:  98 PR Interval:  219 QRS Duration: 99 QT Interval:  356 QTC Calculation: 455 R Axis:   118 Text Interpretation: Sinus rhythm Prolonged PR interval Anterior infarct, old Borderline ST elevation, lateral leads since last tracing no significant change Confirmed by Noemi Chapel [phone]) on 10/11/2021 7:33:10 PM  Radiology DG Chest Portable 1 View  Result Date: 10/11/2021 CLINICAL DATA:  Shortness of breath EXAM: PORTABLE CHEST 1 VIEW COMPARISON:  09/21/2021, 08/06/2021, PET CT 07/15/2021 FINDINGS: Interval near complete opacification of the right thorax. Left  lung is grossly clear. Cardiomediastinal silhouette is obscured IMPRESSION: Interval near complete opacification of right thorax likely due to combination of pleural effusion and airspace disease. Electronically Signed   By: Donavan Foil M.D.   On: 10/11/2021 19:27    Procedures Procedures    Medications Ordered in ED Medications  ceFEPIme (MAXIPIME) 2 g in sodium chloride 0.9 % 100 mL IVPB (has no administration in time range)  vancomycin (VANCOREADY) IVPB 2000 mg/400 mL (has no administration in time range)  vancomycin (VANCOREADY) IVPB 1250 mg/250 mL (has no administration in time range)    ED Course/ Medical Decision Making/ A&P  Medical Decision Making Amount and/or Complexity of Data Reviewed Labs: ordered. Radiology: ordered.  Risk Decision regarding hospitalization.  This patient is a 66 y.o. male who presents to the ED for concern of shortness of breath, this involves an extensive number of treatment options, and is a complaint that carries with it a high risk of complications and morbidity.   Past Medical History / Co-morbidities / Social History: Congestive heart failure, non-small cell lung cancer, COPD, CAD, hypertension, hyperlipidemia  Additional history: Chart reviewed. Pertinent results include: Was last seen on 6/6 by pulmonology.  Has a recurrent right pleural effusion, and pulmonology is recommended as needed thoracentesis for dyspnea, but none was needed at that visit.  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 Delton Coombes with oncology and Dr. Lynnette Caffey with radiation oncology.  Physical Exam: Physical exam performed. The pertinent findings include: Increased respiratory effort, tachypneic.  Slightly tachycardic to 104.  Afebrile.  Decreased lung sounds on the right compared to the left.  Lab Tests: I ordered, and personally interpreted labs.  The pertinent results include: No leukocytosis, hemoglobin  slightly lower compared to prior.  Electrolytes grossly within normal limits.   Imaging Studies: I ordered imaging studies including chest x-ray. I independently visualized and interpreted imaging which showed near complete opacification of right thorax, likely commendation of pleural effusion and airspace disease. I agree with the radiologist interpretation.   Cardiac Monitoring:  The patient was maintained on a cardiac monitor.  My attending physician Dr. Sabra Heck viewed and interpreted the cardiac monitored which showed an underlying rhythm of: sinus rhythm. I agree with this interpretation.   Medications: I ordered medication including antibiotics  for possible pneumonia. I have reviewed the patients home medicines and have made adjustments as needed.  Consultations Obtained: I requested consultation with the hospitalist Dr Clearence Ped,  and discussed lab and imaging findings as well as pertinent plan - they recommend: medical admission for thoracentesis   Disposition: After consideration of the diagnostic results and the patients response to treatment, I feel that patient is requiring admission for management of dyspnea in the setting of pleural effusion vs airspace disease.   I discussed this case with my attending physician Dr. Sabra Heck who cosigned this note including patient's presenting symptoms, physical exam, and planned diagnostics and interventions. Attending physician stated agreement with plan or made changes to plan which were implemented.     Final Clinical Impression(s) / ED Diagnoses Final diagnoses:  Recurrent pleural effusion  Acute respiratory failure with hypoxia ([place])    Rx / DC Orders ED Discharge Orders     None      Portions of this report may have been transcribed using voice recognition software. Every effort was made to ensure accuracy; however, inadvertent computerized transcription errors may be present.    Estill Cotta 10/11/21 2127     Noemi Chapel, MD 10/13/21 [phone]

## 2021-10-11 NOTE — Progress Notes (Signed)
Pharmacy Antibiotic Note  Thomas [name] is a 66 y.o. male admitted on 10/11/2021 with pneumonia.  Pharmacy has been consulted for vancomycin dosing; cefepime also ordered.  He is afebrile, WBC are normal, and renal function is normal. CXR shows complete opacification of right thorax likely due to combination of pleural effusion and airspace disease.  Plan: Vancomycin 2000 mg IV load then 1250 mg IV q12h Goal AUC 400-550, expected AUC: 526.3, SCr used: 0.8, Vd 0.5  Change cefepime to 2 g IV once - f/u further dosing Monitor renal function, clinical progress, cultures/sensitivities F/U LOT and de-escalate as able Vancomycin levels as clinically indicated   Height: 5\' 9"  (175.3 cm) Weight: 116.1 kg (256 lb) IBW/kg (Calculated) : 70.7  Temp (24hrs), Avg:98.2 F (36.8 C), Min:98.2 F (36.8 C), Max:98.2 F (36.8 C)  Recent Labs  Lab 10/11/21 2010  WBC 6.6  CREATININE 0.64    Estimated Creatinine Clearance: 117.3 mL/min (by C-G formula based on SCr of 0.64 mg/dL).    Allergies  Allergen Reactions   Glipizide Palpitations and Other (See Comments)   Codeine Nausea And Vomiting and Other (See Comments)   Doxycycline Other (See Comments)    GI-Upset    Thank you for involving pharmacy in this patient's care.  Loura Back, PharmD, BCPS Clinical Pharmacist Clinical phone for 10/11/2021 until 9:45p is [phone] 10/11/2021 9:16 PM

## 2021-10-12 ENCOUNTER — Encounter (HOSPITAL_COMMUNITY): Payer: Self-pay | Admitting: Family Medicine

## 2021-10-12 ENCOUNTER — Inpatient Hospital Stay (HOSPITAL_COMMUNITY): Payer: Medicare HMO

## 2021-10-12 ENCOUNTER — Other Ambulatory Visit (HOSPITAL_COMMUNITY): Payer: Medicare HMO

## 2021-10-12 DIAGNOSIS — E119 Type 2 diabetes mellitus without complications: Secondary | ICD-10-CM

## 2021-10-12 DIAGNOSIS — J9601 Acute respiratory failure with hypoxia: Secondary | ICD-10-CM | POA: Diagnosis not present

## 2021-10-12 DIAGNOSIS — J189 Pneumonia, unspecified organism: Secondary | ICD-10-CM | POA: Diagnosis not present

## 2021-10-12 DIAGNOSIS — J90 Pleural effusion, not elsewhere classified: Secondary | ICD-10-CM

## 2021-10-12 DIAGNOSIS — I5032 Chronic diastolic (congestive) heart failure: Secondary | ICD-10-CM | POA: Diagnosis not present

## 2021-10-12 DIAGNOSIS — Z720 Tobacco use: Secondary | ICD-10-CM

## 2021-10-12 DIAGNOSIS — E7849 Other hyperlipidemia: Secondary | ICD-10-CM

## 2021-10-12 DIAGNOSIS — M150 Primary generalized (osteo)arthritis: Secondary | ICD-10-CM

## 2021-10-12 LAB — COMPREHENSIVE METABOLIC PANEL
ALT: 10 U/L (ref 0–44)
AST: 7 U/L — ABNORMAL LOW (ref 15–41)
Albumin: 3.1 g/dL — ABNORMAL LOW (ref 3.5–5.0)
Alkaline Phosphatase: 133 U/L — ABNORMAL HIGH (ref 38–126)
Anion gap: 9 (ref 5–15)
BUN: 22 mg/dL (ref 8–23)
CO2: 35 mmol/L — ABNORMAL HIGH (ref 22–32)
Calcium: 8.9 mg/dL (ref 8.9–10.3)
Chloride: 98 mmol/L (ref 98–111)
Creatinine, Ser: 0.56 mg/dL — ABNORMAL LOW (ref 0.61–1.24)
GFR, Estimated: >60 mL/min (ref >60)
Glucose, Bld: 127 mg/dL — ABNORMAL HIGH (ref 70–99)
Potassium: 3.9 mmol/L (ref 3.5–5.1)
Sodium: 142 mmol/L (ref 135–145)
Total Bilirubin: 0.4 mg/dL (ref 0.3–1.2)
Total Protein: 7 g/dL (ref 6.5–8.1)

## 2021-10-12 LAB — CBC WITH DIFFERENTIAL/PLATELET
Abs Immature Granulocytes: 0.03 10*3/uL (ref 0.00–0.07)
Basophils Absolute: 0 10*3/uL (ref 0.0–0.1)
Basophils Relative: 0 %
Eosinophils Absolute: 0.1 10*3/uL (ref 0.0–0.5)
Eosinophils Relative: 3 %
HCT: 45.9 % (ref 39.0–52.0)
Hemoglobin: 13.6 g/dL (ref 13.0–17.0)
Immature Granulocytes: 1 %
Lymphocytes Relative: 8 %
Lymphs Abs: 0.4 10*3/uL — ABNORMAL LOW (ref 0.7–4.0)
MCH: 26.9 pg (ref 26.0–34.0)
MCHC: 29.6 g/dL — ABNORMAL LOW (ref 30.0–36.0)
MCV: 90.9 fL (ref 80.0–100.0)
Monocytes Absolute: 0.5 10*3/uL (ref 0.1–1.0)
Monocytes Relative: 11 %
Neutro Abs: 3.7 10*3/uL (ref 1.7–7.7)
Neutrophils Relative %: 77 %
Platelets: 218 10*3/uL (ref 150–400)
RBC: 5.05 MIL/uL (ref 4.22–5.81)
RDW: 14.6 % (ref 11.5–15.5)
WBC: 4.8 10*3/uL (ref 4.0–10.5)
nRBC: 0 % (ref 0.0–0.2)

## 2021-10-12 LAB — MAGNESIUM: Magnesium: 2 mg/dL (ref 1.7–2.4)

## 2021-10-12 LAB — GRAM STAIN

## 2021-10-12 LAB — BODY FLUID CELL COUNT WITH DIFFERENTIAL
Eos, Fluid: 0 %
Lymphs, Fluid: 70 %
Monocyte-Macrophage-Serous Fluid: 26 % — ABNORMAL LOW (ref 50–90)
Neutrophil Count, Fluid: 4 % (ref 0–25)
Total Nucleated Cell Count, Fluid: 525 cu mm (ref 0–1000)

## 2021-10-12 LAB — ALBUMIN, PLEURAL OR PERITONEAL FLUID: Albumin, Fluid: 2 g/dL

## 2021-10-12 LAB — GLUCOSE, CAPILLARY
Glucose-Capillary: 123 mg/dL — ABNORMAL HIGH (ref 70–99)
Glucose-Capillary: 245 mg/dL — ABNORMAL HIGH (ref 70–99)
Glucose-Capillary: 139 mg/dL — ABNORMAL HIGH (ref 70–99)

## 2021-10-12 LAB — HIV ANTIBODY (ROUTINE TESTING W REFLEX): HIV Screen 4th Generation wRfx: NONREACTIVE

## 2021-10-12 LAB — STREP PNEUMONIAE URINARY ANTIGEN: Strep Pneumo Urinary Antigen: NEGATIVE

## 2021-10-12 LAB — PROCALCITONIN: Procalcitonin: <0.1 ng/mL

## 2021-10-12 MED ORDER — LIDOCAINE HCL (PF) 2 % IJ SOLN
INTRAMUSCULAR | Status: AC
  Administered 2021-10-12: 10 mL
  Filled 2021-10-12: qty 10

## 2021-10-12 MED ORDER — FUROSEMIDE 10 MG/ML IJ SOLN
40.0000 mg | Freq: Every day | INTRAMUSCULAR | Status: DC
Start: 2021-10-12 — End: 2021-10-12
  Administered 2021-10-12: 40 mg via INTRAVENOUS
  Filled 2021-10-12: qty 4

## 2021-10-12 NOTE — Care Management CC44 (Signed)
Condition Code 44 Documentation Completed  Patient Details  Name: Thomas [name] MRN: [id number] Date of Birth: 16-Aug-1955   Condition Code 44 given:  Yes Patient signature on Condition Code 44 notice:  Yes Documentation of 2 MD's agreement:  Yes Code 44 added to claim:  Yes    Villa Herb, LCSWA 10/12/2021, 3:55 PM

## 2021-10-12 NOTE — Assessment & Plan Note (Addendum)
-  Normal WBCs, no fever, low procalcitonin -Patient reports no changes in his chronic coughing spells and denies productive component. -Antibiotics were discontinued -Symptoms improved after thoracentesis and saturation back to baseline 2-3 L nasal cannula supplementation. -Continue outpatient follow-up with cardiology, pulmonology and PCP.

## 2021-10-12 NOTE — Procedures (Signed)
PreOperative Dx: right pleural effusion Postoperative Dx: Multiloculated right pleural effusion Procedure:   US guided right thoracentesis Radiologist:  Tyron Russell Anesthesia:  15 ml of 1% lidocaine Specimen:  650 mL of serosanguinous colored fluid EBL:   < 1 ml Complications: None

## 2021-10-12 NOTE — Assessment & Plan Note (Signed)
-   Mildly exacerbation presumed -Excellent response to IV diuresis -Patient advised to be compliant with home medication management and diuretics. -Low-sodium diet encouraged -Advised to check weight on daily basis and maintain adequate hydration. -Continue patient follow-up with cardiology service.

## 2021-10-12 NOTE — Discharge Summary (Signed)
Physician Discharge Summary   Patient: Thomas [name] MRN: [id number] DOB: 16-May-1955  Admit date:     10/11/2021  Discharge date: 10/12/21  Discharge Physician: Vassie Loll   PCP: Waldon Reining, MD   Recommendations at discharge:  Repeat basic metabolic panel to follow electrolytes and renal function Repeat chest x-ray 8 to follow on patient's pleural effusion reaccumulation Make sure patient has follow-up with cardiology and pulmonology as instructed.  Discharge Diagnoses: Principal Problem:   Acute respiratory failure with hypoxia (HCC) Active Problems:   Tobacco abuse   DM (diabetes mellitus) (HCC)   Recurrent pleural effusion   Chronic diastolic HF (heart failure) [facility])   Hospital Course: As per H&P written by Dr. Carren Rang on 10/12/21 Thomas [name] is a 66 y.o. male with medical history significant of tobacco use disorder, squamous cell carcinoma of the lung, CHF with last ejection fraction 55-60%, type 2 diabetes mellitus, coronary artery disease, fatty liver, obesity, recurrent malignant pleural effusion status post Pleurx catheter 4/21 - 5/26, presents the ED with a chief complaint of dyspnea.  Patient reports it started 3 or 4 days ago.  Is been progressively worse since it started.  He has a cough that is productive of yellow sputum.  He is always had a cough due to his COPD, and does not think this cough has changed much.  He has no fevers, no chest pain, no palpitations.  He reports that he is always dizzy so there is no change from baseline.  Patient denies orthopnea, and peripheral edema.  He does report constipation with the last bowel movement being yesterday.  He had to use stool softeners.  He cannot use MiraLAX because of cramps.  Patient reports urinary frequency as expected with Lasix at home.  Patient has no other complaints at this time.  He wears a 2 L nasal cannula at home and is requiring 4 L nasal cannula to maintain his oxygen saturations in the ED.    Patient does not drink alcohol, smokes half a pack per day, declines nicotine patch, does not use illicit drugs, is vaccinated for COVID, and is DNI. Of note patient reports that he has a DNR form but he would like to rescind that.  He also has a MOST form and would like to continue to be DNI.  We discussed the logic behind compressions without providing oxygen via intubation and he continues to say that he does not want to be intubated, but he does want compressions.  Assessment and Plan: * Acute respiratory failure with hypoxia (HCC) - Multifactorial with pneumonia, pleural effusion, both contributing -Less likely CHF exacerbation contributing as patient does not have other peripheral edema, his last echo showed good ejection fraction and cardiology advised him to hold his Entresto/ARB -Continue to treat as per plan for pleural effusion and pneumonia -Baseline 2 L nasal cannula, today requiring 4 L nasal cannula -Wean off as tolerated -Breathing treatments as needed -Update echo -Continue to monitor  CAP (community acquired pneumonia) - Whiteout of right thorax with possible superimposed pneumonia and pleural effusion -Patient started on vancomycin and cefepime-broad-spectrum coverage given his immunocompromise state -He has no leukocytosis, he is afebrile, but he is tachycardic, tachypneic, and has a cough with yellow sputum -Procalcitonin pending for a.m., if negative consider discontinuing antibiotics -Sputum culture ordered -Strep and Legionella antigens ordered -Continue to monitor  Recurrent pleural effusion - Near complete whiteout of right thorax with pleural effusion and possible superimposed pneumonia -Associated acute respiratory failure with hypoxia -  40 mg of Lasix given -Patient has history of recurrent malignant pleural effusion -He had a Pleurx catheter placed 4/21 and removed 5/26 -Thoracentesis ordered for the a.m. -Lasix ordered overnight -Continue to  monitor  DM (diabetes mellitus) (HCC) -- Hold metformin and Invokana -Sliding scale coverage -Monitor CBGs  Tobacco abuse - Patient continues to smoke half a pack per day -No intention of quitting as its 1 of 2 things he enjoys in life per his report -Educated on the importance of not smoking with oxygen on -Patient declines nicotine patch as needed for nightmares -Continue to counsel on cessation especially in the setting of squamous cell lung cancer    Consultants: IR Procedures performed: See below for x-ray report; thoracentesis. Disposition: Home Diet recommendation: Heart healthy modified carbohydrate diet.  DISCHARGE MEDICATION: Allergies as of 10/12/2021       Reactions   Glipizide Palpitations, Other (See Comments)   Codeine Nausea And Vomiting, Other (See Comments)   Doxycycline Other (See Comments)   GI-Upset        Medication List     STOP taking these medications    ibuprofen 200 MG tablet Commonly known as: ADVIL       TAKE these medications    albuterol 108 (90 Base) MCG/ACT inhaler Commonly known as: VENTOLIN HFA Inhale 2 puffs into the lungs every 6 (six) hours as needed for wheezing or shortness of breath.   albuterol (2.5 MG/3ML) 0.083% nebulizer solution Commonly known as: PROVENTIL Take 3 mLs (2.5 mg total) by nebulization every 6 (six) hours as needed for wheezing or shortness of breath.   aspirin EC 81 MG tablet Take 81 mg by mouth daily.   atorvastatin 80 MG tablet Commonly known as: LIPITOR TAKE ONE TABLET BY MOUTH ONCE DAILY AT 6 PM. What changed: See the new instructions.   canagliflozin 300 MG Tabs tablet Commonly known as: INVOKANA Take 300 mg by mouth daily before breakfast.   carvedilol 12.5 MG tablet Commonly known as: COREG TAKE ONE TABLET BY MOUTH TWICE DAILY   clopidogrel 75 MG tablet Commonly known as: PLAVIX TAKE ONE TABLET BY MOUTH DAILY WITH BREAKFAST   docusate sodium 100 MG capsule Commonly known as:  COLACE Take 300 mg by mouth daily.   furosemide 20 MG tablet Commonly known as: Lasix Take 1 tablet (20 mg total) by mouth daily.   isosorbide mononitrate 60 MG 24 hr tablet Commonly known as: IMDUR TAKE ONE TABLET BY MOUTH EVERY DAY   ketoconazole 2 % cream Commonly known as: NIZORAL Apply 1 application topically daily as needed for irritation.   metFORMIN 500 MG 24 hr tablet Commonly known as: GLUCOPHAGE-XR Take 1,000 mg by mouth 2 (two) times daily.   nitroGLYCERIN 0.4 MG SL tablet Commonly known as: NITROSTAT DISSOLVE ONE TABLET UNDER TONGUE EVERY 5 MINUTES UP TO 3 DOSES AS NEEDED FOR CHEST PAIN What changed: See the new instructions.   omeprazole 40 MG capsule Commonly known as: PRILOSEC Take 40 mg by mouth daily.   potassium chloride 10 MEQ tablet Commonly known as: KLOR-CON Take 1 tablet (10 mEq total) by mouth daily.   sertraline 50 MG tablet Commonly known as: ZOLOFT Take 1 tablet (50 mg total) by mouth daily.        Follow-up Information     Waldon Reining, MD. Schedule an appointment as soon as possible for a visit in 10 day(s).   Specialties: Family Medicine, Sports Medicine Contact information: [address] [place] [address] [phone]  Jonelle Sidle, MD .   Specialty: Cardiology Contact information: [address] [place] [address] [phone]                Discharge Exam: Filed Weights   10/11/21 1903 10/11/21 2248 10/12/21 0500  Weight: 116.1 kg 117.4 kg 115.8 kg   General exam: Alert, awake, oriented x 3; reports feeling breathing back to baseline after thoracentesis.  Patient afebrile, denying chest pain, nausea or vomiting.  Feeling ready to go home. Respiratory system: Decreased breath sounds at the bases (right more than left); no using accessory muscles.  No wheezing on exam.  Positive scattered rhonchi. Cardiovascular system: Rate controlled, no rubs, no gallops, no JVD on  exam. Gastrointestinal system: Abdomen is nondistended, soft and nontender. No organomegaly or masses felt. Normal bowel sounds heard. Central nervous system: Alert and oriented. No focal neurological deficits. Extremities: No cyanosis or clubbing. Skin: No petechiae. Psychiatry: Judgement and insight appear normal. Mood & affect appropriate.    Condition at discharge: Stable and improved.  The results of significant diagnostics from this hospitalization (including imaging, microbiology, ancillary and laboratory) are listed below for reference.   Imaging Studies: US THORACENTESIS ASP PLEURAL SPACE W/IMG GUIDE  Result Date: 10/12/2021 Ulyses Southward, MD     10/12/2021  2:50 PM PreOperative Dx: right pleural effusion Postoperative Dx: Multiloculated right pleural effusion Procedure:   US guided right thoracentesis Radiologist:  Tyron Russell Anesthesia:  15 ml of 1% lidocaine Specimen:  650 mL of serosanguinous colored fluid EBL:   < 1 ml Complications: None    DG Chest 1 View  Result Date: 10/12/2021 CLINICAL DATA:  Lung cancer, recurrent RIGHT pleural effusion post thoracentesis EXAM: CHEST  1 VIEW COMPARISON:  10/11/2021 FINDINGS: Decrease in RIGHT pleural effusion post thoracentesis. Persistent basilar atelectasis and pleural effusion RIGHT chest. No pneumothorax. LEFT lung clear. IMPRESSION: No pneumothorax following RIGHT thoracentesis. Residual pleural effusion and atelectasis, unable to be better aspirated during thoracentesis due to numerous septations and multiple loculations of fluid. Electronically Signed   By: Ulyses Southward M.D.   On: 10/12/2021 14:44   DG Chest Portable 1 View  Result Date: 10/11/2021 CLINICAL DATA:  Shortness of breath EXAM: PORTABLE CHEST 1 VIEW COMPARISON:  09/21/2021, 08/06/2021, PET CT 07/15/2021 FINDINGS: Interval near complete opacification of the right thorax. Left lung is grossly clear. Cardiomediastinal silhouette is obscured IMPRESSION: Interval near complete  opacification of right thorax likely due to combination of pleural effusion and airspace disease. Electronically Signed   By: Jasmine Pang M.D.   On: 10/11/2021 19:27   DG Chest 2 View  Result Date: 09/21/2021 CLINICAL DATA:  66 year old male with a history of pleural effusion and shortness of breath EXAM: CHEST - 2 VIEW COMPARISON:  08/06/2021 FINDINGS: Cardiomediastinal silhouette likely unchanged with the right heart border partially obscured by overlying lung/pleural disease. Dense opacity in the mid and lower right lung with obscuration of the right hemidiaphragm and the right heart border, increased from the comparison plain film. Interval development of multiple round overlapping lucencies of the mid lung and upper right lung with air-fluid levels. Interval removal of the right-sided tunneled pleural drainage catheter. Increased pleuroparenchymal thickening surrounding the right lung compared to the prior. Left lung well aerated. IMPRESSION: Increasing opacity of the right mid and lower lung, most likely a combination of lung consolidation/atelectasis, increasing pleural fluid, with interval removal of the tunneled pleural drainage catheter. Multiple air-fluid lucencies of the right lung, potentially loculated within the pleural  fluid, however, regions of pulmonary necrosis could have this appearance. Electronically Signed   By: Gilmer Mor D.O.   On: 09/21/2021 10:50    Microbiology: Results for orders placed or performed during the hospital encounter of 10/11/21  Culture, body fluid w Gram Stain-bottle     Status: None (Preliminary result)   Collection Time: 10/12/21  1:50 PM   Specimen: Pleura  Result Value Ref Range Status   Specimen Description PLEURAL RIGHT  Final   Special Requests   Final    BOTTLES DRAWN AEROBIC AND ANAEROBIC Blood Culture results may not be optimal due to an excessive volume of blood received in culture bottles Performed at [facility], [address]  [place], [place] [address]    Culture PENDING  Incomplete   Report Status PENDING  Incomplete  Gram stain     Status: None   Collection Time: 10/12/21  1:50 PM   Specimen: Pleura  Result Value Ref Range Status   Specimen Description PLEURAL RIGHT  Final   Special Requests NONE  Final   Gram Stain   Final    NO ORGANISMS SEEN WBC PRESENT, PREDOMINANTLY MONONUCLEAR CYTOSPIN SMEAR Performed at [facility], [address] [place], [place] [address]    Report Status 10/12/2021 FINAL  Final    Labs: CBC: Recent Labs  Lab 10/11/21 2010 10/12/21 0447  WBC 6.6 4.8  NEUTROABS  --  3.7  HGB 12.8* 13.6  HCT 42.5 45.9  MCV 89.7 90.9  PLT 233 218   Basic Metabolic Panel: Recent Labs  Lab 10/11/21 2010 10/12/21 0447  NA 140 142  K 3.9 3.9  CL 98 98  CO2 34* 35*  GLUCOSE 189* 127*  BUN 24* 22  CREATININE 0.64 0.56*  CALCIUM 9.0 8.9  MG  --  2.0   Liver Function Tests: Recent Labs  Lab 10/12/21 0447  AST 7*  ALT 10  ALKPHOS 133*  BILITOT 0.4  PROT 7.0  ALBUMIN 3.1*   CBG: Recent Labs  Lab 10/11/21 2300 10/12/21 0746 10/12/21 1107 10/12/21 1637  GLUCAP 165* 123* 245* 139*    Discharge time spent: greater than 30 minutes.  Signed: Vassie Loll, MD [facility] 10/12/2021

## 2021-10-12 NOTE — Progress Notes (Signed)
Correction: 650 mL of fluid removed from right pleural space today.

## 2021-10-13 LAB — LEGIONELLA PNEUMOPHILA SEROGP 1 UR AG: L. pneumophila Serogp 1 Ur Ag: NEGATIVE

## 2021-10-13 LAB — HEMOGLOBIN A1C
Hgb A1c MFr Bld: 7.5 % — ABNORMAL HIGH (ref 4.8–5.6)
Mean Plasma Glucose: 169 mg/dL

## 2021-10-14 ENCOUNTER — Telehealth (HOSPITAL_COMMUNITY): Payer: Self-pay

## 2021-10-14 DIAGNOSIS — C3490 Malignant neoplasm of unspecified part of unspecified bronchus or lung: Secondary | ICD-10-CM

## 2021-10-14 LAB — PATHOLOGIST SMEAR REVIEW

## 2021-10-14 NOTE — Telephone Encounter (Signed)
Pt had bronch performed by Dr. Valeta Harms 5/15. Nothing further needed.

## 2021-10-14 NOTE — Telephone Encounter (Signed)
Patient called and requested a standing order to be placed in Radiology for his thoracentesis. Order placed for Dr. Delton Coombes. Patient has [place]. Patient called and made aware. Understanding verbalized.

## 2021-10-17 LAB — CULTURE, BODY FLUID W GRAM STAIN -BOTTLE: Culture: NO GROWTH

## 2021-10-18 ENCOUNTER — Other Ambulatory Visit: Payer: Self-pay | Admitting: Cardiology

## 2021-10-18 LAB — ACID FAST SMEAR (AFB, MYCOBACTERIA): Acid Fast Smear: NEGATIVE

## 2021-10-22 ENCOUNTER — Encounter (HOSPITAL_COMMUNITY): Payer: Self-pay

## 2021-10-22 ENCOUNTER — Ambulatory Visit (HOSPITAL_COMMUNITY)
Admission: RE | Admit: 2021-10-22 | Discharge: 2021-10-22 | Disposition: A | Discharge status: Home / Self Care | Payer: Medicare HMO | Source: Ambulatory Visit | Attending: Hematology | Admitting: Hematology

## 2021-10-22 ENCOUNTER — Other Ambulatory Visit (HOSPITAL_COMMUNITY): Payer: Self-pay | Admitting: Hematology

## 2021-10-22 DIAGNOSIS — C3490 Malignant neoplasm of unspecified part of unspecified bronchus or lung: Secondary | ICD-10-CM

## 2021-10-22 MED ORDER — LIDOCAINE HCL (PF) 2 % IJ SOLN
INTRAMUSCULAR | Status: AC
  Filled 2021-10-22: qty 10

## 2021-10-29 ENCOUNTER — Ambulatory Visit (HOSPITAL_COMMUNITY): Payer: Medicare HMO

## 2021-10-29 ENCOUNTER — Other Ambulatory Visit (HOSPITAL_COMMUNITY): Payer: Self-pay | Admitting: Hematology

## 2021-10-29 ENCOUNTER — Ambulatory Visit (HOSPITAL_COMMUNITY): Admission: RE | Admit: 2021-10-29 | Payer: Medicare HMO | Source: Ambulatory Visit

## 2021-10-29 ENCOUNTER — Ambulatory Visit (HOSPITAL_COMMUNITY)
Admission: RE | Admit: 2021-10-29 | Discharge: 2021-10-29 | Disposition: A | Discharge status: Home / Self Care | Payer: Medicare HMO | Source: Ambulatory Visit | Attending: Hematology | Admitting: Hematology

## 2021-10-29 DIAGNOSIS — C3490 Malignant neoplasm of unspecified part of unspecified bronchus or lung: Secondary | ICD-10-CM | POA: Diagnosis present

## 2021-11-05 ENCOUNTER — Ambulatory Visit: Payer: Medicare HMO | Admitting: Internal Medicine

## 2021-11-30 LAB — ACID FAST CULTURE WITH REFLEXED SENSITIVITIES (MYCOBACTERIA): Acid Fast Culture: NEGATIVE

## 2022-01-17 ENCOUNTER — Encounter: Payer: Self-pay | Admitting: Pulmonary Disease

## 2022-01-17 ENCOUNTER — Ambulatory Visit (INDEPENDENT_AMBULATORY_CARE_PROVIDER_SITE_OTHER): Payer: Medicare HMO | Admitting: Pulmonary Disease

## 2022-01-17 ENCOUNTER — Other Ambulatory Visit: Payer: Self-pay | Admitting: Cardiology

## 2022-01-17 ENCOUNTER — Telehealth: Payer: Self-pay | Admitting: Pulmonary Disease

## 2022-01-17 ENCOUNTER — Other Ambulatory Visit (HOSPITAL_COMMUNITY): Payer: Self-pay

## 2022-01-17 VITALS — BP 138/82 | HR 86 | Temp 97.8°F | Ht 69.0 in | Wt 259.4 lb

## 2022-01-17 DIAGNOSIS — J9612 Chronic respiratory failure with hypercapnia: Secondary | ICD-10-CM

## 2022-01-17 DIAGNOSIS — J449 Chronic obstructive pulmonary disease, unspecified: Secondary | ICD-10-CM

## 2022-01-17 DIAGNOSIS — J90 Pleural effusion, not elsewhere classified: Secondary | ICD-10-CM | POA: Diagnosis not present

## 2022-01-17 DIAGNOSIS — J9611 Chronic respiratory failure with hypoxia: Secondary | ICD-10-CM

## 2022-01-17 MED ORDER — STIOLTO RESPIMAT 2.5-2.5 MCG/ACT IN AERS: 2.0000 | INHALATION_SPRAY | Freq: Every day | RESPIRATORY_TRACT | 5 refills | Status: DC

## 2022-01-17 NOTE — Progress Notes (Signed)
[facility]  Chief Complaint  Patient presents with   Follow-up    Follow up on breathing recommended by PCP Patient does not want to see Dr. Melvyn Novas anymore and prefers follow ups to be with Dr. Halford Chessman.     Past Surgical History:  He  has a past surgical history that includes LEFT HEART CATH AND CORONARY ANGIOGRAPHY (N/A, 03/30/2018); Cholecystectomy; Tonsillectomy; Shoulder arthroscopy (Bilateral); Wisdom tooth extraction; Colonoscopy; Upper gi endoscopy; Video bronchoscopy (Right, 10/30/2020); Bronchial biopsy (10/30/2020); Bronchial washings (10/30/2020); Bronchial brushings (10/30/2020); Endobronchial ultrasound (10/30/2020); IR PERC PLEURAL DRAIN W/INDWELL CATH W/IMG GUIDE (08/06/2021); Video bronchoscopy (N/A, 08/30/2021); Bronchial brushings (08/30/2021); and Chest tube insertion (N/A, 09/10/2021).  Past Medical History:  OA, CHF, CAD, Fatty liver, GERD, Neuropathy, Pneumonia, DM type 2, NSCLC  Constitutional:  BP 138/82 (BP Location: Left Arm, Patient Position: Sitting)   Pulse 86   Temp 97.8 F (36.6 C) (Temporal)   Ht 5\' 9"  (1.753 m)   Wt 259 lb 6.4 oz (117.7 kg)   SpO2 92% Comment: 5LO2 pulse  BMI 38.31 kg/m   Brief Summary:  Thomas [name] is a 66 y.o. male smoker with COPD, chronic respiratory failure, lung cancer and persistent Rt pleural effusion.       Subjective:   He had thoracentesis in June.  Had 650 ml fluid removed.  He feels like his breathing is doing well.  Not having much cough.  Not having chest pain, wheeze, fever, or hemoptysis.  Sleeping okay.  Uses 3 to 5 liters oxygen either continuous flow or pulsed, depending on his activity level.    He has follow up CT and oncology appointment later this month.  Physical Exam:   Appearance - well kempt, wearing oxygen  ENMT - no sinus tenderness, no oral exudate, no LAN, Mallampati 3 airway, no stridor  Respiratory - bronchial breath sounds on Rt  CV - s1s2 regular  rate and rhythm, no murmurs  Ext - no clubbing, no edema  Skin - no rashes  Psych - normal mood and affect    Pulmonary testing:  Lt thoracentesis 12/08/20 >> 1.2 liters yellow fluid, reactive mesothelial cells ABG 05/24/21 >> pH 7.49, PCO2 56 ,PO2 59 PFT 06/21/21 >> FEV1 0.93 (27%), FEV1% 58, TLC 5/74 (69%), DLCO 36%, +BD  Chest Imaging:  LDCT chest 10/08/20 >> atherosclerosis, mod Rt effusion, centrilobular emphysema, occlusion of bronchus intermedius, scattered nodules CT chest 06/10/21 >> 1.1 cm endobronchial lesion in bronchus intermedius, RML/RLL collapse, mod/large Rt pleural effusion  Cardiac Tests:  Echo 08/19/21 >> EF 55 to 60%, mild LVH  Social History:  He  reports that he has been smoking cigarettes. He started smoking about 56 years ago. He has a 27.50 pack-year smoking history. He has never used smokeless tobacco. He reports that he does not currently use alcohol. He reports current drug use. Drug: Marijuana.  Family History:  His family history includes Emphysema in his father; Heart failure in his father.     Assessment/Plan:   Severe COPD with emphysema. - trelegy was cost prohibitive - will see if he can get set up with stiolto two puffs daily - prn albuterol  Recurrent Rt pleural effusion. - he had allergic reaction to tape used with pleurx catheter, and wouldn't want another one - prn thoracentesis for dyspnea; not needed at present  Chronic hypoxic, hypercapnic respiratory failure. - he uses 2 liters continuous flow to 5 liters pulsed depending on his activity level - goal  SpO2 90 to 95% - explained the difficulty with changing DMEs because of insurance restrictions  Tobacco abuse. 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 Delton Coombes oncology and Dr. Lynnette Caffey with radiation oncology  Coronary artery disease, history of ischemic cardiomyopathy. - followed by Dr. Domenic Polite  with cardiology  Time Spent Involved in Patient Care on Day of Examination:  36 minutes  Follow up:   Patient Instructions  Stiolto two puffs daily  Follow up in 4 months  Medication List:   Allergies as of 01/17/2022       Reactions   Glipizide Palpitations, Other (See Comments), Swelling   Codeine Nausea And Vomiting, Other (See Comments)   Doxycycline Other (See Comments)   GI-Upset        Medication List        Accurate as of January 17, 2022 10:38 AM. If you have any questions, ask your nurse or doctor.          STOP taking these medications    docusate sodium 100 MG capsule Commonly known as: COLACE Stopped by: Chesley Mires, MD       TAKE these medications    albuterol 108 (90 Base) MCG/ACT inhaler Commonly known as: VENTOLIN HFA Inhale 2 puffs into the lungs every 6 (six) hours as needed for wheezing or shortness of breath.   albuterol (2.5 MG/3ML) 0.083% nebulizer solution Commonly known as: PROVENTIL Take 3 mLs (2.5 mg total) by nebulization every 6 (six) hours as needed for wheezing or shortness of breath.   amoxicillin 875 MG tablet Commonly known as: AMOXIL Take 875 mg by mouth 2 (two) times daily.   aspirin EC 81 MG tablet Take 81 mg by mouth daily.   atorvastatin 80 MG tablet Commonly known as: LIPITOR TAKE ONE TABLET BY MOUTH DAILY AT 6 PM   canagliflozin 300 MG Tabs tablet Commonly known as: INVOKANA Take 300 mg by mouth daily before breakfast.   carvedilol 12.5 MG tablet Commonly known as: COREG TAKE ONE TABLET BY MOUTH TWICE DAILY   clopidogrel 75 MG tablet Commonly known as: PLAVIX TAKE ONE TABLET BY MOUTH DAILY WITH BREAKFAST   furosemide 20 MG tablet Commonly known as: Lasix Take 1 tablet (20 mg total) by mouth daily.   isosorbide mononitrate 60 MG 24 hr tablet Commonly known as: IMDUR TAKE ONE TABLET BY MOUTH EVERY DAY   ketoconazole 2 % cream Commonly known as: NIZORAL Apply 1 application topically daily as  needed for irritation.   metFORMIN 500 MG 24 hr tablet Commonly known as: GLUCOPHAGE-XR Take 1,000 mg by mouth 2 (two) times daily.   nitroGLYCERIN 0.4 MG SL tablet Commonly known as: NITROSTAT DISSOLVE ONE TABLET UNDER TONGUE EVERY 5 MINUTES UP TO 3 DOSES AS NEEDED FOR CHEST PAIN What changed: See the new instructions.   omeprazole 40 MG capsule Commonly known as: PRILOSEC Take 40 mg by mouth daily.   potassium chloride 10 MEQ tablet Commonly known as: KLOR-CON Take 1 tablet (10 mEq total) by mouth daily.   sertraline 50 MG tablet Commonly known as: ZOLOFT Take 1 tablet (50 mg total) by mouth daily.   Stiolto Respimat 2.5-2.5 MCG/ACT Aers Generic drug: Tiotropium Bromide-Olodaterol Inhale 2 puffs into the lungs daily. Started by: Chesley Mires, MD        Signature:  Chesley Mires, MD [place] Pager - [phone] 01/17/2022, 10:38 AM

## 2022-01-17 NOTE — Patient Instructions (Signed)
Stiolto two puffs daily  Follow up in 4 months

## 2022-01-17 NOTE — Telephone Encounter (Signed)
Prior auth team, can we do a PA for stiolto?

## 2022-01-17 NOTE — Telephone Encounter (Signed)
Test billing for this patient's plan returns the   following results LABA/LAMA for products:  stiolto $125.70 bevespi $112.21 anoro is not under formulary  Pt do not need prior. pt is in [facility] If pt can not afford cost Please have the pt to apply for pt assistant due to pt having medicare.     Sandre [facility] Patient Advocate

## 2022-01-17 NOTE — Telephone Encounter (Signed)
Called and spoke to patient. He is agreeable to trying pt assistance application. Offered to leave application at front for pick up and to call patient when we get more stiolto samples. Patient is agreeable to this. Pt assistance forms placed up front. Nothing further needed for now.

## 2022-01-20 ENCOUNTER — Inpatient Hospital Stay: Payer: Medicare HMO | Attending: Hematology

## 2022-01-20 ENCOUNTER — Ambulatory Visit (HOSPITAL_COMMUNITY)
Admission: RE | Admit: 2022-01-20 | Discharge: 2022-01-20 | Disposition: A | Discharge status: Home / Self Care | Payer: Medicare HMO | Source: Ambulatory Visit | Attending: Hematology | Admitting: Hematology

## 2022-01-20 DIAGNOSIS — R748 Abnormal levels of other serum enzymes: Secondary | ICD-10-CM | POA: Diagnosis not present

## 2022-01-20 DIAGNOSIS — R918 Other nonspecific abnormal finding of lung field: Secondary | ICD-10-CM | POA: Diagnosis not present

## 2022-01-20 DIAGNOSIS — F17210 Nicotine dependence, cigarettes, uncomplicated: Secondary | ICD-10-CM | POA: Diagnosis not present

## 2022-01-20 DIAGNOSIS — C3490 Malignant neoplasm of unspecified part of unspecified bronchus or lung: Secondary | ICD-10-CM | POA: Insufficient documentation

## 2022-01-20 DIAGNOSIS — C3431 Malignant neoplasm of lower lobe, right bronchus or lung: Secondary | ICD-10-CM | POA: Insufficient documentation

## 2022-01-20 DIAGNOSIS — D649 Anemia, unspecified: Secondary | ICD-10-CM | POA: Insufficient documentation

## 2022-01-20 LAB — COMPREHENSIVE METABOLIC PANEL
ALT: 11 U/L (ref 0–44)
AST: 12 U/L — ABNORMAL LOW (ref 15–41)
Albumin: 3.2 g/dL — ABNORMAL LOW (ref 3.5–5.0)
Alkaline Phosphatase: 143 U/L — ABNORMAL HIGH (ref 38–126)
Anion gap: 10 (ref 5–15)
BUN: 25 mg/dL — ABNORMAL HIGH (ref 8–23)
CO2: 35 mmol/L — ABNORMAL HIGH (ref 22–32)
Calcium: 8.6 mg/dL — ABNORMAL LOW (ref 8.9–10.3)
Chloride: 98 mmol/L (ref 98–111)
Creatinine, Ser: 0.64 mg/dL (ref 0.61–1.24)
GFR, Estimated: >60 mL/min (ref >60)
Glucose, Bld: 295 mg/dL — ABNORMAL HIGH (ref 70–99)
Potassium: 4.3 mmol/L (ref 3.5–5.1)
Sodium: 143 mmol/L (ref 135–145)
Total Bilirubin: 0.4 mg/dL (ref 0.3–1.2)
Total Protein: 7.4 g/dL (ref 6.5–8.1)

## 2022-01-20 LAB — CBC WITH DIFFERENTIAL/PLATELET
Abs Immature Granulocytes: 0.03 10*3/uL (ref 0.00–0.07)
Basophils Absolute: 0 10*3/uL (ref 0.0–0.1)
Basophils Relative: 1 %
Eosinophils Absolute: 0.1 10*3/uL (ref 0.0–0.5)
Eosinophils Relative: 2 %
HCT: 41.6 % (ref 39.0–52.0)
Hemoglobin: 12.2 g/dL — ABNORMAL LOW (ref 13.0–17.0)
Immature Granulocytes: 1 %
Lymphocytes Relative: 6 %
Lymphs Abs: 0.4 10*3/uL — ABNORMAL LOW (ref 0.7–4.0)
MCH: 25.6 pg — ABNORMAL LOW (ref 26.0–34.0)
MCHC: 29.3 g/dL — ABNORMAL LOW (ref 30.0–36.0)
MCV: 87.4 fL (ref 80.0–100.0)
Monocytes Absolute: 0.4 10*3/uL (ref 0.1–1.0)
Monocytes Relative: 7 %
Neutro Abs: 4.9 10*3/uL (ref 1.7–7.7)
Neutrophils Relative %: 83 %
Platelets: 254 10*3/uL (ref 150–400)
RBC: 4.76 MIL/uL (ref 4.22–5.81)
RDW: 16.9 % — ABNORMAL HIGH (ref 11.5–15.5)
WBC: 5.8 10*3/uL (ref 4.0–10.5)
nRBC: 0 % (ref 0.0–0.2)

## 2022-01-20 MED ORDER — IOHEXOL 300 MG/ML  SOLN
75.0000 mL | Freq: Once | INTRAMUSCULAR | Status: AC | PRN
  Administered 2022-01-20: 75 mL via INTRAVENOUS

## 2022-01-21 ENCOUNTER — Telehealth: Payer: Self-pay | Admitting: Pulmonary Disease

## 2022-01-21 NOTE — Telephone Encounter (Signed)
[facility] cares papers received and signed by Dr. Halford Chessman. Papers faxed to [phone], confirmation received. Nothing further needed for now. Will update encounter once we get a response from [facility] cares.

## 2022-01-27 ENCOUNTER — Inpatient Hospital Stay (HOSPITAL_BASED_OUTPATIENT_CLINIC_OR_DEPARTMENT_OTHER): Payer: Medicare HMO | Admitting: Hematology

## 2022-01-27 VITALS — BP 132/64 | HR 86 | Temp 97.6°F | Resp 18 | Wt 263.0 lb

## 2022-01-27 DIAGNOSIS — C3491 Malignant neoplasm of unspecified part of right bronchus or lung: Secondary | ICD-10-CM

## 2022-01-27 DIAGNOSIS — C3431 Malignant neoplasm of lower lobe, right bronchus or lung: Secondary | ICD-10-CM | POA: Diagnosis not present

## 2022-01-27 NOTE — Patient Instructions (Addendum)
[place] at [facility] Discharge Instructions   You were seen and examined today by Dr. Delton Coombes.  He reviewed the results of your CT scan which are stable.  We will repeat a scan in 3 months .  Return as scheduled.    Thank you for choosing [place] at [facility] to provide your oncology and hematology care.  To afford each patient quality time with our provider, please arrive at least 15 minutes before your scheduled appointment time.   If you have a lab appointment with the [facility] please come in thru the Main Entrance and check in at the main information desk.  You need to re-schedule your appointment should you arrive 10 or more minutes late.  We strive to give you quality time with our providers, and arriving late affects you and other patients whose appointments are after yours.  Also, if you no show three or more times for appointments you may be dismissed from the clinic at the providers discretion.     Again, thank you for choosing [facility].  Our hope is that these requests will decrease the amount of time that you wait before being seen by our physicians.       _____________________________________________________________  Should you have questions after your visit to [facility], please contact our office at [phone] and follow the prompts.  Our office hours are 8:00 a.m. and 4:30 p.m. Monday - Friday.  Please note that voicemails left after 4:00 p.m. may not be returned until the following business day.  We are closed weekends and major holidays.  You do have access to a nurse 24-7, just call the main number to the clinic [phone] and do not press any options, hold on the line and a nurse will answer the phone.    For prescription refill requests, have your pharmacy contact our office and allow 72 hours.    Due to Covid, you will need to wear a mask upon entering the hospital. If  you do not have a mask, a mask will be given to you at the Main Entrance upon arrival. For doctor visits, patients may have 1 support person age 73 or older with them. For treatment visits, patients can not have anyone with them due to social distancing guidelines and our immunocompromised population.

## 2022-01-27 NOTE — Progress Notes (Signed)
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 Small to moderate size right-sided pleural effusion that appears to be at least partially loculated. 2. Airspace consolidation involving the right middle lobe and right lower lobe concerning for multifocal pneumonia. Follow-up to resolution is recommended as an underlying mass cannot be excluded. 3. There are few scattered 4 mm pulmonary nodules as detailed above. No follow-up needed if patient is low-risk (and has no known or suspected primary neoplasm). Non-contrast chest CT can be considered in 12 months if patient is high-risk.  4. Right-sided thyroid nodule as detailed above. This can be further evaluated with ultrasound as clinically indicated. 5. Hepatic steatosis. 6. Advanced coronary artery calcifications are noted. 7. Dilated main pulmonary artery which can be seen in patients with elevated pulmonary artery pressures.   04/28/2020 Imaging   ECHO  1. Left ventricular ejection fraction, by estimation, is 55 to 60%. The left ventricle has normal function. The left ventricle has no regional wall motion abnormalities. Left ventricular diastolic parameters were normal.   2. Right ventricular systolic function is normal. The right ventricular size is normal.   3. The mitral valve is normal in structure. No evidence of mitral valve regurgitation. No evidence of mitral stenosis.   4. The aortic valve was not well visualized.   5. The inferior vena cava is normal in size with greater than 50% respiratory variability,  suggesting right atrial pressure of 3 mmHg.    10/07/2020 Imaging   CT chest 1. Lung-RADS 4B, suspicious. Additional imaging evaluation or consultation with Pulmonology or Thoracic Surgery recommended. Right middle and lower lobe collapse associated with moderate right pleural effusion. Occlusion of the bronchus intermedius evident. Central obstructing mass lesion a concern. CT chest with contrast recommended to further evaluate. These changes have been slowly progressive since 12/19/2018 and if neoplastic etiology has already been excluded, then given the degree of collapse/consolidation in the right lung, this patient is not a candidate for lung cancer screening. 2. Enlargement of the pulmonary outflow tract and main pulmonary arteries suggests pulmonary arterial hypertension. 3. Aortic Atherosclerosis (ICD10-I70.0).   10/30/2020 Pathology Results   Clinical History: Endobronchial lesion  Specimen Submitted:  B. LUNG, RLL SUPERIOR  , BRUSHING:    FINAL MICROSCOPIC DIAGNOSIS:  - Malignant cells consistent with squamous cell carcinoma    10/30/2020 Pathology Results   FINAL MICROSCOPIC DIAGNOSIS:  - Malignant cells consistent with squamous cell carcinoma    10/30/2020 Procedure   Pre-op Diagnosis: Collapsed RLL   Post-op Diagnosis: Endobronchial mass, RLL superior segment    Surgeon: Leory Plowman L Icard,DO  Operation: Flexible video fiberoptic bronchoscopy and biopsies. Indications and History: Thomas [name] is 66 y.o. with history of RLL collapse, and smoking.  Recommendation was to perform video fiberoptic bronchoscopy with biopsies. The risks, benefits, complications, treatment options and expected outcomes were discussed with the patient.  The possibilities of pneumothorax, pneumonia, reaction to medication, pulmonary aspiration, perforation of a viscus, bleeding, failure to diagnose a condition and creating a complication requiring transfusion or operation were discussed with the patient  who freely signed the  consent.     Description of Procedure: The patient was seen in the Preoperative Area, was examined and was deemed appropriate to proceed.  The patient was taken to [facility] endoscopy room 2, identified as Thomas [name] and the procedure verified as Flexible Video Fiberoptic Bronchoscopy.  A Time Out was held and the above information confirmed.   Therapeutic bronchoscope was inserted through the endotracheal tube and a general inspection was performed which showed normal cords, normal trachea, normal main carina. The R sided airways were inspected and showed normal RUL, BI, RML and the right lower lobe superior segment revealed a small endobronchial mass arising from the opening. The L side was then inspected. The LLL, Lingular and LUL airways were normal.    Using the standard therapeutic bronchoscope, and 2.8 mm [employer] forceps biopsies were taken from the right lower lobe superior segment opening and visible endobronchial tumor.  We also completed cytology brushings to the right lower lobe superior segment as well as BAL specimen to be sent for cytology to the right lower lobe.  Following biopsies we switched to the Olympus endobronchial ultrasound scope.  We were able to visualize the right lower lobe area of collapsed lung.  There was no clear identified mass under ultrasound.  I suspect that the lesion that we are seeing just arises from the opening of the right lower lobe superior segment.  Due to the area of atelectasis around the lesion was difficult to identify a discrete mass.  No samples were taken under ultrasound guidance.   Samples: 1. Endobronchial brushings from right lower lobe 2. Endobronchial forceps biopsies from right lower lobe 3. Bronchial washings from right lower lobe   11/11/2020 Initial Diagnosis   Non-small cell lung cancer ([place])   11/11/2020 Cancer Staging   Staging form: Lung, AJCC 8th Edition - Clinical: Stage IVA (cT4, cN0, cM1a) - Signed  by Heath Lark, MD on 11/11/2020   11/25/2020 Genetic Testing   PD-L1 Results:       CANCER STAGING:  Cancer Staging  Non-small cell lung cancer ([place]) Staging form: Lung, AJCC 8th Edition - Clinical: Stage IVA (cT4, cN0, cM1a) - Signed by Heath Lark, MD on 11/11/2020   INTERVAL HISTORY:  Thomas [name], a 66 y.o. 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  Reports energy levels are 50%.  Stable dyspnea on exertion and oxygen requirements.  Denies any hemoptysis or new onset chest pains.  Chronic headaches and dizziness is also stable.  REVIEW OF SYSTEMS:  Review of Systems  Constitutional:  Negative for appetite change and fatigue.  Respiratory:  Positive for shortness of breath (On exertion).   Genitourinary:  Positive for frequency.   Neurological:  Positive for dizziness and headaches.  All other systems reviewed and are negative.   PAST MEDICAL/SURGICAL HISTORY:  Past Medical History:  Diagnosis Date   Arthritis    Bone spur    cervical - numbness left arm and hand   Cancer (HCC)    CHF (congestive heart failure) (HCC)    COPD (chronic obstructive pulmonary disease) (HCC)    Coronary artery disease    Dyspnea    Oxygen 2.5L via [place] qhs and occasionally during the day prn   Fatty liver    GERD (gastroesophageal reflux disease)    Lung cancer ([place])    Morbid obesity ([place])    Neuromuscular disorder ([place])    NSTEMI (non-ST elevated myocardial infarction) ([place])  a. s/p cath in 03/2018 showing 90% OM2 stenosis and nonobstructive CAD along LAD and LCx with medical management recommended   Pneumonia    x2   Secondary cardiomyopathy ([place])    Snoring    Type 2 diabetes mellitus ([place])    Past Surgical History:  Procedure Laterality Date   BRONCHIAL BIOPSY  10/30/2020   Procedure: BRONCHIAL BIOPSIES;  Surgeon: Thomas Nash, DO;  Location: [facility];  Service: Pulmonary;;   BRONCHIAL BRUSHINGS  10/30/2020    Procedure: BRONCHIAL BRUSHINGS;  Surgeon: Thomas Nash, DO;  Location: [facility];  Service: Pulmonary;;   BRONCHIAL BRUSHINGS  08/30/2021   Procedure: BRONCHIAL BRUSHINGS;  Surgeon: Thomas Nash, DO;  Location: [facility];  Service: Pulmonary;;   BRONCHIAL WASHINGS  10/30/2020   Procedure: BRONCHIAL WASHINGS;  Surgeon: Thomas Nash, DO;  Location: [facility];  Service: Pulmonary;;   CHEST TUBE INSERTION N/A 09/10/2021   Procedure: REMOVAL PLEURAL DRAINAGE CATHETER;  Surgeon: Thomas Nash, DO;  Location: [facility];  Service: Pulmonary;  Laterality: N/A;  removal of IPC   CHOLECYSTECTOMY     COLONOSCOPY     ENDOBRONCHIAL ULTRASOUND  10/30/2020   Procedure: ENDOBRONCHIAL ULTRASOUND;  Surgeon: Thomas Nash, DO;  Location: [facility];  Service: Pulmonary;;   IR PERC PLEURAL DRAIN W/INDWELL CATH W/IMG GUIDE  08/06/2021   LEFT HEART CATH AND CORONARY ANGIOGRAPHY N/A 03/30/2018   Procedure: LEFT HEART CATH AND CORONARY ANGIOGRAPHY;  Surgeon: Troy Sine, MD;  Location: [facility];  Service: Cardiovascular;  Laterality: N/A;   SHOULDER ARTHROSCOPY Bilateral    TONSILLECTOMY     UPPER GI ENDOSCOPY     VIDEO BRONCHOSCOPY Right 10/30/2020   Procedure: VIDEO BRONCHOSCOPY WITHOUT FLUORO;  Surgeon: Thomas Nash, DO;  Location: [facility];  Service: Pulmonary;  Laterality: Right;  possible cryotherapy   VIDEO BRONCHOSCOPY N/A 08/30/2021   Procedure: VIDEO BRONCHOSCOPY WITHOUT FLUORO;  Surgeon: Thomas Nash, DO;  Location: [facility];  Service: Pulmonary;  Laterality: N/A;  cryotherapy and tumor debulking   WISDOM TOOTH EXTRACTION      SOCIAL HISTORY:  Social History   Socioeconomic History   Marital status: Married    Spouse name: Not on file   Number of children: Not on file   Years of education: Not on file   Highest education level: Not on file  Occupational History   Not on file  Tobacco Use   Smoking status: Some Days    Packs/day: 0.50     Years: 55.00    Total pack years: 27.50    Types: Cigarettes    Start date: 02/21/1965    Last attempt to quit: 04/01/2021    Years since quitting: 0.8   Smokeless tobacco: Never   Tobacco comments:     1/2 pack a day [facility] 08/16/2021  Vaping Use   Vaping Use: Never used  Substance and Sexual Activity   Alcohol use: Not Currently   Drug use: Yes    Types: Marijuana    Comment: Last use 1970's   Sexual activity: Not Currently  Other Topics Concern   Not on file  Social History Narrative   Not on file   Social Determinants of Health   Financial Resource Strain: Low Risk  (11/11/2020)   Overall Financial Resource Strain (CARDIA)    Difficulty of Paying Living Expenses: Not hard at all  Food Insecurity: No Food Insecurity (11/11/2020)   Hunger Vital Sign    Worried About  Running Out of Food in the Last Year: Never true    [place] in the Last Year: Never true  Transportation Needs: No Transportation Needs (11/11/2020)   PRAPARE - Hydrologist (Medical): No    Lack of Transportation (Non-Medical): No  Physical Activity: Inactive (11/11/2020)   Exercise Vital Sign    Days of Exercise per Week: 0 days    Minutes of Exercise per Session: 0 min  Stress: No Stress Concern Present (11/11/2020)   [place]    Feeling of Stress : Not at all  Social Connections: Moderately Isolated (11/11/2020)   Social Connection and Isolation Panel [NHANES]    Frequency of Communication with Friends and Family: More than three times a week    Frequency of Social Gatherings with Friends and Family: More than three times a week    Attends Religious Services: Never    Marine scientist or Organizations: No    Attends Archivist Meetings: Never    Marital Status: Married  Human resources officer Violence: Not At Risk (11/11/2020)   Humiliation, Afraid, Rape, and Kick questionnaire    Fear of Current  or Ex-Partner: No    Emotionally Abused: No    Physically Abused: No    Sexually Abused: No    FAMILY HISTORY:  Family History  Problem Relation Age of Onset   Emphysema Father    Heart failure Father     CURRENT MEDICATIONS:  Current Outpatient Medications  Medication Sig Dispense Refill   albuterol (PROVENTIL) (2.5 MG/3ML) 0.083% nebulizer solution Take 3 mLs (2.5 mg total) by nebulization every 6 (six) hours as needed for wheezing or shortness of breath. 120 mL 11   albuterol (VENTOLIN HFA) 108 (90 Base) MCG/ACT inhaler Inhale 2 puffs into the lungs every 6 (six) hours as needed for wheezing or shortness of breath.     aspirin EC 81 MG tablet Take 81 mg by mouth daily.     atorvastatin (LIPITOR) 80 MG tablet TAKE ONE TABLET BY MOUTH DAILY AT 6 PM 90 tablet 2   canagliflozin (INVOKANA) 300 MG TABS tablet Take 300 mg by mouth daily before breakfast.     carvedilol (COREG) 12.5 MG tablet TAKE ONE TABLET BY MOUTH TWICE DAILY 180 tablet 1   clopidogrel (PLAVIX) 75 MG tablet TAKE ONE TABLET BY MOUTH DAILY WITH BREAKFAST (Patient taking differently: Take 75 mg by mouth daily with breakfast.) 90 tablet 3   furosemide (LASIX) 20 MG tablet Take 1 tablet (20 mg total) by mouth daily. 30 tablet 11   isosorbide mononitrate (IMDUR) 60 MG 24 hr tablet TAKE ONE TABLET BY MOUTH EVERY DAY (Patient taking differently: Take 60 mg by mouth daily.) 90 tablet 3   ketoconazole (NIZORAL) 2 % cream Apply 1 application topically daily as needed for irritation.     metFORMIN (GLUCOPHAGE-XR) 500 MG 24 hr tablet Take 1,000 mg by mouth 2 (two) times daily.     omeprazole (PRILOSEC) 40 MG capsule Take 40 mg by mouth daily.     potassium chloride (KLOR-CON) 10 MEQ tablet Take 1 tablet (10 mEq total) by mouth daily. 90 tablet 3   sertraline (ZOLOFT) 50 MG tablet Take 1 tablet (50 mg total) by mouth daily. 30 tablet 0   Tiotropium Bromide-Olodaterol (STIOLTO RESPIMAT) 2.5-2.5 MCG/ACT AERS Inhale 2 puffs into the lungs  daily. 1 each 5   nitroGLYCERIN (NITROSTAT) 0.4 MG SL tablet DISSOLVE  ONE TABLET UNDER TONGUE EVERY 5 MINUTES UP TO 3 DOSES AS NEEDED FOR CHEST PAIN (Patient not taking: Reported on 01/27/2022) 25 tablet 3   No current facility-administered medications for this visit.    ALLERGIES:  Allergies  Allergen Reactions   Glipizide Palpitations, Other (See Comments) and Swelling   Bactrim [Sulfamethoxazole-Trimethoprim]     Made sick to stomach   Codeine Nausea And Vomiting and Other (See Comments)   Doxycycline Other (See Comments)    GI-Upset    PHYSICAL EXAM:  Performance status (ECOG): 2 - Symptomatic, <50% confined to bed  Vitals:   01/27/22 1122  BP: 132/64  Pulse: 86  Resp: 18  Temp: 97.6 F (36.4 C)  SpO2: 93%   Wt Readings from Last 3 Encounters:  01/27/22 263 lb 0.1 oz (119.3 kg)  01/17/22 259 lb 6.4 oz (117.7 kg)  10/12/21 255 lb 4.7 oz (115.8 kg)   Physical Exam Vitals reviewed.  Constitutional:      Appearance: Normal appearance. He is obese.     Interventions: Nasal cannula in place.  Cardiovascular:     Rate and Rhythm: Normal rate and regular rhythm.     Pulses: Normal pulses.     Heart sounds: Normal heart sounds.  Pulmonary:     Effort: Pulmonary effort is normal.     Breath sounds: Normal breath sounds.  Neurological:     General: No focal deficit present.     Mental Status: He is alert and oriented to person, place, and time.  Psychiatric:        Mood and Affect: Mood normal.        Behavior: Behavior normal.      LABORATORY DATA:  I have reviewed the labs as listed.     Latest Ref Rng & Units 01/20/2022    9:35 AM 10/12/2021    4:47 AM 10/11/2021    8:10 PM  CBC  WBC 4.0 - 10.5 K/uL 5.8  4.8  6.6   Hemoglobin 13.0 - 17.0 g/dL 12.2  13.6  12.8   Hematocrit 39.0 - 52.0 % 41.6  45.9  42.5   Platelets 150 - 400 K/uL 254  218  233       Latest Ref Rng & Units 01/20/2022    9:35 AM 10/12/2021    4:47 AM 10/11/2021    8:10 PM  CMP  Glucose 70  - 99 mg/dL 295  127  189   BUN 8 - 23 mg/dL _0 Creatinine 0.61 - 1.24 mg/dL 0.64  0.56  0.64   Sodium 135 - 145 mmol/L 143  142  140   Potassium 3.5 - 5.1 mmol/L 4.3  3.9  3.9   Chloride 98 - 111 mmol/L 98  98  98   CO2 22 - 32 mmol/L 35  35  34   Calcium 8.9 - 10.3 mg/dL 8.6  8.9  9.0   Total Protein 6.5 - 8.1 g/dL 7.4  7.0    Total Bilirubin 0.3 - 1.2 mg/dL 0.4  0.4    Alkaline Phos 38 - 126 U/L 143  133    AST 15 - 41 U/L 12  7    ALT 0 - 44 U/L 11  10      DIAGNOSTIC IMAGING:  I have independently reviewed the scans and discussed with the patient. CT Chest W Contrast  Result Date: 01/21/2022 CLINICAL DATA:  Non-small cell lung cancer follow-up * Tracking Code: BO *  EXAM: CT CHEST WITH CONTRAST TECHNIQUE: Multidetector CT imaging of the chest was performed during intravenous contrast administration. RADIATION DOSE REDUCTION: This exam was performed according to the departmental dose-optimization program which includes automated exposure control, adjustment of the mA and/or kV according to patient size and/or use of iterative reconstruction technique. CONTRAST:  69m OMNIPAQUE IOHEXOL 300 MG/ML  SOLN COMPARISON:  CT chest, 09/01/2021, PET-CT, 07/15/2021 FINDINGS: Cardiovascular: Aortic atherosclerosis. Cardiomegaly. Three-vessel coronary artery calcifications. Gross enlargement of the main pulmonary artery measuring up to 4.0 cm in caliber. No pericardial effusion. Mediastinum/Nodes: Unchanged prominent right paratracheal lymph nodes measuring up to 1.1 x 1.0 cm (series 2, image 49). Thyroid gland, trachea, and esophagus demonstrate no significant findings. Lungs/Pleura: Diffuse bilateral bronchial wall thickening. Similar volume of a large right pleural effusion with extensive atelectasis or consolidation and pleural thickening. Frothy debris in the right mainstem bronchus (series 2, image 68). Similar consolidation, fibrosis, and volume loss of the perihilar right lung. Extensive  ground-glass and interlobular septal thickening of the aerated portion of the right upper lobe, which is increased compared to prior examination (series 4, image 43). The left lung is normally aerated. Unchanged ground-glass opacity of the peripheral posterior left upper lobe measuring 0.8 x 0.8 cm (series 4, image 29). Upper Abdomen: No acute abnormality. Coarse, nodular contour of the liver. Status post cholecystectomy. Musculoskeletal: No chest wall abnormality. No acute osseous findings. IMPRESSION: 1. Similar volume of a large right pleural effusion with extensive atelectasis or consolidation of the right middle and right lower lobes. Pleural thickening increased compared to prior examination. 2. Similar post treatment/post radiation consolidation, fibrosis, and volume loss of the perihilar right lung. 3. Extensive ground-glass and interlobular septal thickening of the aerated portion of the right upper lobe, which is increased compared to prior examination and most consistent with lymphangitic metastatic disease. 4. Unchanged prominent mediastinal lymph nodes, not previously FDG avid. 5. Unchanged nonspecific ground-glass nodule of the peripheral posterior left upper lobe measuring 0.8 x 0.8 cm. Attention on follow-up. 6. Diffuse bilateral bronchial wall thickening. 7. Cardiomegaly and coronary artery disease. 8. Enlargement of the main pulmonary artery as can be seen in pulmonary hypertension. 9. Coarse, nodular contour of the liver in the included upper abdomen, suggestive of cirrhosis. Aortic Atherosclerosis (ICD10-I70.0). Electronically Signed   By: ADelanna AhmadiM.D.   On: 01/21/2022 16:52     ASSESSMENT:  1.  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  Rest of the bronchoscopy was normal. - Right lower lobe brushing consistent with malignant cells, squamous  cell carcinoma. - PET scan on 11/25/2020 with small hypermetabolic focus in the posterior aspect of the inferior right hilum, near the region of the airway occlusion SUV max 7.9.  No hypermetabolic mediastinal or left hilar disease.  No hypermetabolic metastatic disease.  Similar appearance of right lower lobe and right middle lobe collapse.  Stable right pleural effusion. - Continuous oxygen requirement since her pneumonia in January 2022.  Denies any hemoptysis.  Denies weight loss.  Neck - PD-L1 TPS (22 C3)-70% - Foundation 1 with PIK3CA amplification, FGF 3/FGF 4 amplification, no EGFR/ALK/RET/BRAF mutations. - Right thoracentesis on 12/08/2020, 1.2 L removed.  Cytology negative for malignancy. - CT head with and without contrast on 12/08/2020 negative for metastatic disease. - XRT to the endobronchial lesion from 01/14/2021 through 03/05/2021  2.  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 - Smoked less than 1 pack/day for 50 years.  Current active smoker. - Father died of mesothelioma.  Paternal uncle had throat cancer.  3 other paternal uncles had cancers.  Brother had small cell lung cancer.  Maternal grandmother had colon cancer.     PLAN:  1.  Endobronchial squamous cell carcinoma in the right lower lobe superior segment, PD-L1 70%: - I have reviewed CT chest with contrast from 02/09/2022 which showed extensive groundglass and interlobular septal thickening of the aerated portion of the right upper lobe increased from prior exam.  I think this could be radiation-induced changes rather than lymphangitic metastatic disease as the patient is functioning very well. - Similar volume large right pleural effusion with extensive atelectasis.  Unchanged prominent mediastinal lymph nodes. - He reportedly had COVID 1 month ago and had cough for about 3 to 7 days.  He has recovered completely. - Reviewed his labs from 01/20/2022 which showed mild  normocytic anemia and elevated alkaline phosphatase which is stable. - Given the above findings, I have recommended follow-up in 3 months with repeat CT scan of the chest with contrast and labs.    Orders placed this encounter:  Orders Placed This Encounter  Procedures   [place], MD [place] [phone]

## 2022-01-28 ENCOUNTER — Telehealth: Payer: Self-pay | Admitting: Pulmonary Disease

## 2022-02-02 NOTE — Telephone Encounter (Signed)
This is in regards to inhaler. Routing to [employer], [place].  Knox Saliva, PharmD, MPH, BCPS, CPP Clinical Pharmacist (Rheumatology and Pulmonology)

## 2022-02-02 NOTE — Telephone Encounter (Signed)
Called and notified patients wife Thomas [name], that a few days ago we received a fax from Rx assistance as well and I have faxed the page they were asking for. Nothing further needed at this time

## 2022-02-07 ENCOUNTER — Encounter: Payer: Self-pay | Admitting: *Deleted

## 2022-02-07 ENCOUNTER — Telehealth: Payer: Self-pay | Admitting: Pulmonary Disease

## 2022-02-07 NOTE — Telephone Encounter (Signed)
Called and notified patients wife that I would leave samples at front for pt to pick up tomorrow. Nothing further needed

## 2022-02-07 NOTE — Telephone Encounter (Signed)
Patient called to see if there is an update on Stiolot assistance-  patient is out and would like more samples if possible. Please advise.

## 2022-02-07 NOTE — Telephone Encounter (Signed)
Called and spoke with pt's spouse and provided her with [employer]'s phone number so that way they can check on update of pt's application. I did state to her when everything has been approved that they should receive a letter in the mail and she verbalized understanding.  She stated that pt was wanting to know if he could get some more samples of the Stiolto while waiting to hear about the assistance.  Routing to [facility] in [place] for her to check on this as pt does see VS at the [place] office.

## 2022-02-07 NOTE — Telephone Encounter (Signed)
Patient would like to know the name of the procedure and date. Was done last year. Can send thru mychart. Patient phone number is [phone].

## 2022-02-22 NOTE — Progress Notes (Unsigned)
Cardiology Office Note  Date: 02/23/2022   ID: [name], DOB 18-Feb-1956, MRN [id number]  PCP:  Thomas Douglas, MD  Cardiologist:  Rozann Lesches, MD Electrophysiologist:  None   Chief Complaint  Patient presents with   Cardiac follow-up    History of Present Illness: Thomas [name] is a 66 y.o. male last seen in April by Ms. Dunn PA-C, I reviewed the note.  He is here for a routine visit.  He reports stable NYHA class II dyspnea, no angina or nitroglycerin use.  Still using supplemental oxygen with chronic hypoxic respiratory failure in the setting of COPD.  Reports no leg swelling, no orthopnea or PND.  Follow-up echocardiogram back in May revealed LVEF 55 to 60% range.  He continues to follow with Dr. Delton Coombes, history of non-small cell lung cancer.  Chest CT imaging from October is noted below.  Past Medical History:  Diagnosis Date   Arthritis    Bone spur    cervical - numbness left arm and hand   Cancer (HCC)    CHF (congestive heart failure) (HCC)    COPD (chronic obstructive pulmonary disease) (HCC)    Coronary artery disease    Dyspnea    Oxygen 2.5L via [place] qhs and occasionally during the day prn   Fatty liver    GERD (gastroesophageal reflux disease)    Lung cancer (HCC)    Morbid obesity ([place])    Neuromuscular disorder ([place])    NSTEMI (non-ST elevated myocardial infarction) ([place])    a. s/p cath in 03/2018 showing 90% OM2 stenosis and nonobstructive CAD along LAD and LCx with medical management recommended   Pneumonia    x2   Secondary cardiomyopathy ([place])    Snoring    Type 2 diabetes mellitus ([place])     Past Surgical History:  Procedure Laterality Date   BRONCHIAL BIOPSY  10/30/2020   Procedure: BRONCHIAL BIOPSIES;  Surgeon: Garner Nash, DO;  Location: [facility];  Service: Pulmonary;;   BRONCHIAL BRUSHINGS  10/30/2020   Procedure: BRONCHIAL BRUSHINGS;  Surgeon: Garner Nash, DO;  Location: [facility];  Service: Pulmonary;;    BRONCHIAL BRUSHINGS  08/30/2021   Procedure: BRONCHIAL BRUSHINGS;  Surgeon: Garner Nash, DO;  Location: [facility];  Service: Pulmonary;;   BRONCHIAL WASHINGS  10/30/2020   Procedure: BRONCHIAL WASHINGS;  Surgeon: Garner Nash, DO;  Location: [facility];  Service: Pulmonary;;   CHEST TUBE INSERTION N/A 09/10/2021   Procedure: REMOVAL PLEURAL DRAINAGE CATHETER;  Surgeon: Garner Nash, DO;  Location: [facility];  Service: Pulmonary;  Laterality: N/A;  removal of IPC   CHOLECYSTECTOMY     COLONOSCOPY     ENDOBRONCHIAL ULTRASOUND  10/30/2020   Procedure: ENDOBRONCHIAL ULTRASOUND;  Surgeon: Garner Nash, DO;  Location: [facility];  Service: Pulmonary;;   IR PERC PLEURAL DRAIN W/INDWELL CATH W/IMG GUIDE  08/06/2021   LEFT HEART CATH AND CORONARY ANGIOGRAPHY N/A 03/30/2018   Procedure: LEFT HEART CATH AND CORONARY ANGIOGRAPHY;  Surgeon: Troy Sine, MD;  Location: [facility];  Service: Cardiovascular;  Laterality: N/A;   SHOULDER ARTHROSCOPY Bilateral    TONSILLECTOMY     UPPER GI ENDOSCOPY     VIDEO BRONCHOSCOPY Right 10/30/2020   Procedure: VIDEO BRONCHOSCOPY WITHOUT FLUORO;  Surgeon: Garner Nash, DO;  Location: [facility];  Service: Pulmonary;  Laterality: Right;  possible cryotherapy   VIDEO BRONCHOSCOPY N/A 08/30/2021   Procedure: VIDEO BRONCHOSCOPY WITHOUT FLUORO;  Surgeon: Garner Nash, DO;  [facility];  Service: Pulmonary;  Laterality: N/A;  cryotherapy and tumor debulking   WISDOM TOOTH EXTRACTION      Current Outpatient Medications  Medication Sig Dispense Refill   albuterol (PROVENTIL) (2.5 MG/3ML) 0.083% nebulizer solution Take 3 mLs (2.5 mg total) by nebulization every 6 (six) hours as needed for wheezing or shortness of breath. 120 mL 11   albuterol (VENTOLIN HFA) 108 (90 Base) MCG/ACT inhaler Inhale 2 puffs into the lungs every 6 (six) hours as needed for wheezing or shortness of breath.     aspirin EC 81 MG tablet Take 81 mg by  mouth daily.     atorvastatin (LIPITOR) 80 MG tablet TAKE ONE TABLET BY MOUTH DAILY AT 6 PM 90 tablet 2   canagliflozin (INVOKANA) 300 MG TABS tablet Take 300 mg by mouth daily before breakfast.     carvedilol (COREG) 12.5 MG tablet TAKE ONE TABLET BY MOUTH TWICE DAILY 180 tablet 1   clopidogrel (PLAVIX) 75 MG tablet TAKE ONE TABLET BY MOUTH DAILY WITH BREAKFAST (Patient taking differently: Take 75 mg by mouth daily with breakfast.) 90 tablet 3   furosemide (LASIX) 20 MG tablet Take 1 tablet (20 mg total) by mouth daily. 30 tablet 11   isosorbide mononitrate (IMDUR) 60 MG 24 hr tablet TAKE ONE TABLET BY MOUTH EVERY DAY (Patient taking differently: Take 60 mg by mouth daily.) 90 tablet 3   ketoconazole (NIZORAL) 2 % cream Apply 1 application topically daily as needed for irritation.     metFORMIN (GLUCOPHAGE-XR) 500 MG 24 hr tablet Take 1,000 mg by mouth 2 (two) times daily.     nitroGLYCERIN (NITROSTAT) 0.4 MG SL tablet DISSOLVE ONE TABLET UNDER TONGUE EVERY 5 MINUTES UP TO 3 DOSES AS NEEDED FOR CHEST PAIN 25 tablet 3   omeprazole (PRILOSEC) 40 MG capsule Take 40 mg by mouth daily.     potassium chloride (KLOR-CON) 10 MEQ tablet Take 1 tablet (10 mEq total) by mouth daily. 90 tablet 3   sertraline (ZOLOFT) 50 MG tablet Take 1 tablet (50 mg total) by mouth daily. 30 tablet 0   Tiotropium Bromide-Olodaterol (STIOLTO RESPIMAT) 2.5-2.5 MCG/ACT AERS Inhale 2 puffs into the lungs daily. 1 each 5   No current facility-administered medications for this visit.   Allergies:  Glipizide, Bactrim [sulfamethoxazole-trimethoprim], Codeine, and Doxycycline   Social History: The patient  reports that he has been smoking cigarettes. He started smoking about 57 years ago. He has a 27.50 pack-year smoking history. He has never used smokeless tobacco. He reports that he does not currently use alcohol. He reports current drug use. Drug: Marijuana.   Family History: The patient's family history includes Emphysema in  his father; Heart failure in his father.   ROS: No syncope.  Physical Exam: VS:  BP 130/74   Pulse 81   Ht 5\' 9"  (1.753 m)   Wt 259 lb (117.5 kg)   SpO2 94%   BMI 38.25 kg/m , BMI Body mass index is 38.25 kg/m.  Wt Readings from Last 3 Encounters:  02/23/22 259 lb (117.5 kg)  01/27/22 263 lb 0.1 oz (119.3 kg)  01/17/22 259 lb 6.4 oz (117.7 kg)    General: Patient appears comfortable at rest.  Wearing oxygen via nasal cannula. HEENT: Conjunctiva and lids normal. Neck: Supple, no elevated JVP or carotid bruits, no thyromegaly. Lungs: Decreased breath sounds at rest, no wheezing. Cardiac: Regular rate and rhythm, no S3 or significant systolic murmur, no pericardial rub. Extremities: No pitting edema.  ECG:  An ECG dated 10/12/2021 was personally reviewed today and demonstrated:  Sinus cardia with prolonged PR interval and poor R wave progression rule out old anterior infarct pattern.  Recent Labwork: 06/22/2021: B Natriuretic Peptide 18.0 10/12/2021: Magnesium 2.0 01/20/2022: ALT 11; AST 12; BUN 25; Creatinine, Ser 0.64; Hemoglobin 12.2; Platelets 254; Potassium 4.3; Sodium 143     Component Value Date/Time   CHOL 181 03/30/2018 0543   TRIG 322 (H) 03/30/2018 0543   HDL 26 (L) 03/30/2018 0543   CHOLHDL 7.0 03/30/2018 0543   VLDL 64 (H) 03/30/2018 0543   LDLCALC 91 03/30/2018 0543    Other Studies Reviewed Today:  Echocardiogram 08/19/2021:  1. Left ventricular ejection fraction, by estimation, is 55 to 60%. The  left ventricle has normal function. Left ventricular endocardial border  not optimally defined to evaluate regional wall motion. The left  ventricular internal cavity size was mildly  dilated. There is mild left ventricular hypertrophy.   2. Right ventricular systolic function is normal. The right ventricular  size is normal.   3. The aortic valve was not well visualized.   4. The inferior vena cava is normal in size with greater than 50%  respiratory variability,  suggesting right atrial pressure of 3 mmHg.   5. Limited echo   Chest CT with contrast 01/20/2022: IMPRESSION: 1. Similar volume of a large right pleural effusion with extensive atelectasis or consolidation of the right middle and right lower lobes. Pleural thickening increased compared to prior examination. 2. Similar post treatment/post radiation consolidation, fibrosis, and volume loss of the perihilar right lung. 3. Extensive ground-glass and interlobular septal thickening of the aerated portion of the right upper lobe, which is increased compared to prior examination and most consistent with lymphangitic metastatic disease. 4. Unchanged prominent mediastinal lymph nodes, not previously FDG avid. 5. Unchanged nonspecific ground-glass nodule of the peripheral posterior left upper lobe measuring 0.8 x 0.8 cm. Attention on follow-up. 6. Diffuse bilateral bronchial wall thickening. 7. Cardiomegaly and coronary artery disease. 8. Enlargement of the main pulmonary artery as can be seen in pulmonary hypertension. 9. Coarse, nodular contour of the liver in the included upper abdomen, suggestive of cirrhosis.   Aortic Atherosclerosis (ICD10-I70.0).  Assessment and Plan:  1.  HFrecEF with history of ischemic cardiomyopathy, LVEF 55 to 60% by echocardiogram in May.  He is symptomatically stable with NYHA class II dyspnea, also stable weight.  Continue Coreg and Lasix with potassium supplement.  He had been on Entresto in the past, ultimately discontinued and at this point we will hold off on resumption given clinical stability.  2.  Branch vessel CAD with plan to continue medical therapy in the absence of angina.  Continue aspirin, Lipitor, Imdur, and as needed nitroglycerin.  3.  COPD with chronic hypoxic respiratory failure on supplemental oxygen.  4.  Non-small cell lung cancer, continue to follow with oncology.  Medication Adjustments/Labs and Tests Ordered: Current medicines are  reviewed at length with the patient today.  Concerns regarding medicines are outlined above.   Tests Ordered: No orders of the defined types were placed in this encounter.   Medication Changes: No orders of the defined types were placed in this encounter.   Disposition:  Follow up  6 months.  Signed, Satira Sark, MD, [facility] 02/23/2022 1:55 PM    [facility] at [facility] [address] [place], [place] [address] Phone: [phone]; Fax: [phone]

## 2022-02-23 ENCOUNTER — Ambulatory Visit: Payer: Medicare HMO | Attending: Cardiology | Admitting: Cardiology

## 2022-02-23 ENCOUNTER — Encounter: Payer: Self-pay | Admitting: Cardiology

## 2022-02-23 VITALS — BP 130/74 | HR 81 | Ht 69.0 in | Wt 259.0 lb

## 2022-02-23 DIAGNOSIS — I25119 Atherosclerotic heart disease of native coronary artery with unspecified angina pectoris: Secondary | ICD-10-CM

## 2022-02-23 DIAGNOSIS — Z8679 Personal history of other diseases of the circulatory system: Secondary | ICD-10-CM

## 2022-02-23 NOTE — Patient Instructions (Addendum)

## 2022-03-04 ENCOUNTER — Telehealth: Payer: Self-pay | Admitting: Pulmonary Disease

## 2022-03-04 NOTE — Telephone Encounter (Signed)
Called and spoke to patient gave him [employer] [facility] number so he can call and ask for an update. Stiolto samples left at front and patient is planning to come pick them up Monday. Nothing further needed at this time,

## 2022-03-07 ENCOUNTER — Telehealth: Payer: Self-pay | Admitting: Pulmonary Disease

## 2022-03-07 MED ORDER — STIOLTO RESPIMAT 2.5-2.5 MCG/ACT IN AERS: 2.0000 | INHALATION_SPRAY | Freq: Every day | RESPIRATORY_TRACT | 11 refills | Status: AC

## 2022-03-07 NOTE — Telephone Encounter (Signed)
Called and spoke with patient.  [facility] needs application refaxed.  Refaxed application to [employer] [facility] and patient notified. Nothing further needed at this time.

## 2022-03-31 ENCOUNTER — Ambulatory Visit (INDEPENDENT_AMBULATORY_CARE_PROVIDER_SITE_OTHER): Payer: Medicare HMO | Admitting: Orthopaedic Surgery

## 2022-03-31 ENCOUNTER — Encounter: Payer: Self-pay | Admitting: Orthopaedic Surgery

## 2022-03-31 VITALS — Ht 69.0 in | Wt 254.0 lb

## 2022-03-31 DIAGNOSIS — S32020D Wedge compression fracture of second lumbar vertebra, subsequent encounter for fracture with routine healing: Secondary | ICD-10-CM

## 2022-03-31 DIAGNOSIS — S32000A Wedge compression fracture of unspecified lumbar vertebra, initial encounter for closed fracture: Secondary | ICD-10-CM | POA: Insufficient documentation

## 2022-03-31 NOTE — Progress Notes (Signed)
x  Office Visit Note   Patient: Thomas [name]           Date of Birth: January 30, 1956           MRN: [id number] Visit Date: 03/31/2022              Requested by: Leonie Douglas, MD [address] [place],  [place] [id number] PCP: Leonie Douglas, MD   Assessment & Plan: Visit Diagnoses:  1. Compression fracture of L2 vertebra with routine healing, subsequent encounter     Plan: Return 5 weeks single lateral lumbar image on return.  We discussed avoiding lifting, fall prevention.  Follow-Up Instructions: Return in about 5 weeks (around 05/05/2022).   Orders:  No orders of the defined types were placed in this encounter.  No orders of the defined types were placed in this encounter.     Procedures: No procedures performed   Clinical Data: No additional findings.   Subjective: Chief Complaint  Patient presents with   Left Elbow - Pain   Lower Back - Pain    HPI 66 year old male misstep landing on his buttocks in November about a month ago had pain in his back and was told he had L2 compression fracture.  Additionally has had problems with left elbow olecranon bursitis intermittently.  He is on aspirin and Plavix nitroglycerin portable oxygen.  He has diabetes takes metformin and has used nicotine nasal spray in the past has COPD.  Review of Systems 14 point review systems otherwise noncontributory.   Objective: Vital Signs: Ht 5\' 9"  (1.753 m)   Wt 254 lb (115.2 kg)   BMI 37.51 kg/m   Physical Exam Constitutional:      Appearance: He is well-developed.  HENT:     Head: Normocephalic and atraumatic.     Right Ear: External ear normal.     Left Ear: External ear normal.  Eyes:     Pupils: Pupils are equal, round, and reactive to light.  Neck:     Thyroid: No thyromegaly.     Trachea: No tracheal deviation.  Cardiovascular:     Rate and Rhythm: Normal rate.  Pulmonary:     Effort: Pulmonary effort is normal.     Breath sounds: No wheezing.  Abdominal:      General: Bowel sounds are normal.     Palpations: Abdomen is soft.  Musculoskeletal:     Cervical back: Neck supple.  Skin:    General: Skin is warm and dry.     Capillary Refill: Capillary refill takes less than 2 seconds.  Neurological:     Mental Status: He is alert and oriented to person, place, and time.  Psychiatric:        Behavior: Behavior normal.        Thought Content: Thought content normal.        Judgment: Judgment normal.     Ortho Exam patient has small TENS pad on the thoracic spine.  Patient ambulates with his portable oxygen.  Negative logroll hips.  Specialty Comments:  No specialty comments available.  Imaging: 03/02/2022 CT scan comparison to 05/23/2021 shows age-indeterminate L2 compression fracture 20% loss of height without any retropulsion.   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   Chronic respiratory failure with hypoxia and hypercapnia (  [place]) 01/25/2021   Non-small cell lung cancer ([place]) 11/11/2020   Endobronchial mass 10/15/2020   Atypical chest pain 08/21/2020   COPD GOLD IV with atypical f/v loop /  active smoker 07/14/2020   Cigarette smoker 07/14/2020   Acute on chronic respiratory failure with hypoxia and hypercapnia ([place])    CAP (community acquired pneumonia) 04/27/2020   Acute respiratory failure with hypoxia ([place]) 04/27/2020   Acute exacerbation of CHF (congestive heart failure) ([place]) 04/26/2020   CAD (coronary artery disease) 04/26/2020   HTN (hypertension) 04/26/2020   Hyperlipidemia 04/26/2020   Recurrent pleural effusion 12/19/2018   Acute respiratory failure with hypoxia and hypercapnia ([place]) 12/14/2018   Acute on chronic systolic CHF (congestive heart failure) ([place]) 12/14/2018   Morbid obesity ([place]) 03/31/2018   Ischemic  cardiomyopathy 03/31/2018   Tobacco abuse 03/31/2018   DM (diabetes mellitus) ([place]) 03/31/2018   NSTEMI (non-ST elevated myocardial infarction) ([place]) 03/29/2018   Past Medical History:  Diagnosis Date   Arthritis    Bone spur    cervical - numbness left arm and hand   Cancer (HCC)    CHF (congestive heart failure) (HCC)    COPD (chronic obstructive pulmonary disease) ([place])    Coronary artery disease    Dyspnea    Oxygen 2.5L via [place] qhs and occasionally during the day prn   Fatty liver    GERD (gastroesophageal reflux disease)    Lung cancer (HCC)    Morbid obesity ([place])    Neuromuscular disorder ([place])    NSTEMI (non-ST elevated myocardial infarction) ([place])    a. s/p cath in 03/2018 showing 90% OM2 stenosis and nonobstructive CAD along LAD and LCx with medical management recommended   Pneumonia    x2   Secondary cardiomyopathy ([place])    Snoring    Type 2 diabetes mellitus ([place])     Family History  Problem Relation Age of Onset   Emphysema Father    Heart failure Father     Past Surgical History:  Procedure Laterality Date   BRONCHIAL BIOPSY  10/30/2020   Procedure: BRONCHIAL BIOPSIES;  Surgeon: Garner Nash, DO;  Location: [facility];  Service: Pulmonary;;   BRONCHIAL BRUSHINGS  10/30/2020   Procedure: BRONCHIAL BRUSHINGS;  Surgeon: Garner Nash, DO;  Location: [facility];  Service: Pulmonary;;   BRONCHIAL BRUSHINGS  08/30/2021   Procedure: BRONCHIAL BRUSHINGS;  Surgeon: Garner Nash, DO;  Location: [facility];  Service: Pulmonary;;   BRONCHIAL WASHINGS  10/30/2020   Procedure: BRONCHIAL WASHINGS;  Surgeon: Garner Nash, DO;  Location: [facility];  Service: Pulmonary;;   CHEST TUBE INSERTION N/A 09/10/2021   Procedure: REMOVAL PLEURAL DRAINAGE CATHETER;  Surgeon: Garner Nash, DO;  Location: [facility];  Service: Pulmonary;  Laterality: N/A;  removal of IPC   CHOLECYSTECTOMY     COLONOSCOPY     ENDOBRONCHIAL ULTRASOUND  10/30/2020   Procedure:  ENDOBRONCHIAL ULTRASOUND;  Surgeon: Garner Nash, DO;  Location: [facility];  Service: Pulmonary;;   IR PERC PLEURAL DRAIN W/INDWELL CATH W/IMG GUIDE  08/06/2021   LEFT HEART CATH AND CORONARY ANGIOGRAPHY N/A 03/30/2018   Procedure: LEFT HEART CATH AND CORONARY ANGIOGRAPHY;  Surgeon: Troy Sine, MD;  Location: [facility];  Service: Cardiovascular;  Laterality: N/A;   SHOULDER ARTHROSCOPY Bilateral    TONSILLECTOMY     UPPER GI ENDOSCOPY     VIDEO BRONCHOSCOPY Right 10/30/2020   Procedure: VIDEO BRONCHOSCOPY WITHOUT FLUORO;  Surgeon: Garner Nash, DO;  Location:  [facility];  Service: Pulmonary;  Laterality: Right;  possible cryotherapy   VIDEO BRONCHOSCOPY N/A 08/30/2021   Procedure: VIDEO BRONCHOSCOPY WITHOUT FLUORO;  Surgeon: Garner Nash, DO;  Location: [facility];  Service: Pulmonary;  Laterality: N/A;  cryotherapy and tumor debulking   WISDOM TOOTH EXTRACTION     Social History   Occupational History   Not on file  Tobacco Use   Smoking status: Some Days    Packs/day: 0.50    Years: 55.00    Total pack years: 27.50    Types: Cigarettes    Start date: 02/21/1965    Last attempt to quit: 04/01/2021    Years since quitting: 0.9   Smokeless tobacco: Never   Tobacco comments:     1/2 pack a day [facility] 08/16/2021  Vaping Use   Vaping Use: Never used  Substance and Sexual Activity   Alcohol use: Not Currently   Drug use: Yes    Types: Marijuana    Comment: Last use 1970's   Sexual activity: Not Currently

## 2022-04-13 ENCOUNTER — Encounter (HOSPITAL_COMMUNITY): Payer: Self-pay

## 2022-04-13 ENCOUNTER — Inpatient Hospital Stay: Admit: 2022-04-13 | Payer: Medicare HMO | Admitting: Internal Medicine

## 2022-04-18 DEATH — deceased

## 2022-04-28 ENCOUNTER — Other Ambulatory Visit: Payer: Medicare HMO

## 2022-04-28 ENCOUNTER — Ambulatory Visit (HOSPITAL_COMMUNITY): Payer: Medicare HMO

## 2022-05-05 ENCOUNTER — Ambulatory Visit: Payer: Medicare HMO | Admitting: Hematology

## 2022-05-19 ENCOUNTER — Ambulatory Visit: Payer: Medicare HMO | Admitting: Orthopaedic Surgery

## 2022-06-07 ENCOUNTER — Ambulatory Visit: Payer: Medicare HMO | Admitting: Pulmonary Disease

## 2022-09-20 ENCOUNTER — Ambulatory Visit: Payer: Medicare HMO | Admitting: Cardiology
